# Patient Record
Sex: Female | Born: 1935
Health system: Southern US, Community
[De-identification: ages and names within clinical notes are randomized; demographics above are authoritative.]

## PROBLEM LIST (undated history)

## (undated) DIAGNOSIS — I10 Essential (primary) hypertension: Secondary | ICD-10-CM

## (undated) DIAGNOSIS — I2510 Atherosclerotic heart disease of native coronary artery without angina pectoris: Secondary | ICD-10-CM

## (undated) DIAGNOSIS — E119 Type 2 diabetes mellitus without complications: Secondary | ICD-10-CM

## (undated) DIAGNOSIS — M1990 Unspecified osteoarthritis, unspecified site: Secondary | ICD-10-CM

## (undated) DIAGNOSIS — J45909 Unspecified asthma, uncomplicated: Secondary | ICD-10-CM

## (undated) HISTORY — DX: Atherosclerotic heart disease of native coronary artery without angina pectoris: I25.10

## (undated) HISTORY — PX: CHOLECYSTECTOMY: SHX55

## (undated) HISTORY — PX: ABDOMINAL HYSTERECTOMY: SHX81

---

## 2006-08-11 ENCOUNTER — Inpatient Hospital Stay (HOSPITAL_COMMUNITY): Admission: EM | Admit: 2006-08-11 | Discharge: 2006-08-12 | Payer: Self-pay | Admitting: Family Medicine

## 2009-04-07 ENCOUNTER — Emergency Department (HOSPITAL_COMMUNITY): Admission: EM | Admit: 2009-04-07 | Discharge: 2009-04-07 | Payer: Self-pay | Admitting: Emergency Medicine

## 2010-11-29 LAB — URINALYSIS, ROUTINE W REFLEX MICROSCOPIC
Bilirubin Urine: NEGATIVE
Nitrite: NEGATIVE
Protein, ur: NEGATIVE mg/dL
Urobilinogen, UA: 1 mg/dL (ref 0.0–1.0)

## 2010-11-29 LAB — URINE MICROSCOPIC-ADD ON

## 2010-11-29 LAB — URINE CULTURE: Colony Count: 4000

## 2011-01-09 NOTE — Discharge Summary (Signed)
NAMESTEFAN, [name]                  ACCOUNT NO.:  [id number]   MEDICAL RECORD NO.:  [id number]          PATIENT TYPE:  INP   LOCATION:  [id number]                         FACILITY:  [facility]   PHYSICIAN:  Aquilla Hacker, M.D. DATE OF BIRTH:  1934-08-28   DATE OF ADMISSION:  08/10/2006  DATE OF DISCHARGE:  08/12/2006                               DISCHARGE SUMMARY   FINAL DIAGNOSES:  1. hyperglycemia secondary to newly diagnosed diabetes mellitus.  2. Hyperkalemia.  3. Azotemia.  4. Newly diagnosed hypertension.   PROCEDURES:  Portable chest x-ray completed December18,2007.   HISTORY OF PRESENT ILLNESS:  [name] is a 76 year old female who  arrived stating that for one week prior to her admission she had  developed lightheadedness, dizziness, generalized weakness as well as an  increase in urination and thirst.  Her vision had become intermittently  blurred.  She had experienced two episodes of nausea and emesis.  For  past medical history, please see that dictated by Dr. Rexene Alberts on  December18,2007.   HOSPITAL COURSE:  Problem 1:  NEWLY DIAGNOSED DIABETES MELLITUS:  The  patient indicated that she has numerous family members including  siblings who have a history of diabetes mellitus.  She was found to be  hyperglycemic.  Her venous glucose in the emergency department  registered greater than 700.  She was not found to be in ketoacidosis,  however.  She was started on a Glucomander which quickly helped to  decrease her sugars.  Glipizide was initiated as well as Lantus insulin.  The patient's symptoms quickly resolved as her sugars decreased.  The  patient's sugars, however, never became ideally controlled, although her  symptoms did improve.  I am hoping that the patient's primary care  physician may help to bring the patient's sugars into more ideal range  when she is discharged from the hospital.   Problem 2:  NEWLY DIAGNOSED HYPERTENSION:  At the time of her admission,  the patient's blood pressure was noted to have been 171/91.  She was  started on Norvasc during her hospital course.  Her blood pressures have  been better controlled over the course of the hospitalization.   Problem 3:  HYPERKALEMIA:  When the patient initially arrived into the  hospital, her potassium level was noted to have been 5.7.  This was  believed to have been related to the patient's hyperglycemia.  The  patient's potassium level has improved over the course of her  hospitalization.   Problem 4:  AZOTEMIA:  The patient's BUN level at the time of admission  was 36.  Her creatinine level reached a high of 1.4.  Both numbers have  since improved over the course of her hospitalization.  Her BUN as of  today December20, 2007 is 21.  Her creatinine is 0.9.  The patient  currently indicates that her symptoms feel better.  She has not had any  repeat episodes of nausea or vomiting documented during the course of  this hospitalization.  The decision has been made to discharge the  patient from the hospital and to  home.  The patient's vitals on the date  of discharge:  Her temperature is 98.2, heart rate 77, respirations 18,  blood pressure 110/75.  Her CBG have been 319, 367 and 228 respectively.  Her O2 sats are 97% on room air.  The patient's labs:  Her white blood  cell count is 10.6, hemoglobin 12.6, hematocrit 37.7, platelet count  199.  Sodium 137, potassium 5.0, chloride 110, CO2 23, BUN 21,  creatinine 0.9, calcium 8.7.  Her hemoglobin A1c is 12.7.  On  December19, 2007, it was noted to be 13.1.  Her cholesterol was 177,  triglycerides 165, HDL 35, LDL 109.  The decision has been made to  discharge the patient from the hospital.  The patient will be discharged  home on the following medications:  1. Glipizide 10 mg p.o. b.i.d.  2. Norvasc 5 mg p.o. daily.  3. Lantus insulin 10 units subcutaneously q.h.s.   I have called Dr. Sheilah Mins clinic and requested that the patient  be  accepted as a new patient.  I was told that the patient would be  accepted by one of Dr. Fransico Setters staff members.  The patient is scheduled  to be seen for the very first time by Dr. Criss Rosales on December28,2007.      Aquilla Hacker, M.D.  Electronically Signed     OR/MEDQ  D:  08/12/2006  T:  08/12/2006  Job:  [id number]   cc:   Lucianne Lei, M.D.

## 2011-01-09 NOTE — H&P (Signed)
[name], [name]                  ACCOUNT NO.:  [id number]   MEDICAL RECORD NO.:  [id number]          PATIENT TYPE:  INP   LOCATION:  1825                         FACILITY:  [facility]   PHYSICIAN:  Rexene Alberts, M.D.    DATE OF BIRTH:  Dec 26, 1934   DATE OF ADMISSION:  08/10/2006  DATE OF DISCHARGE:                              HISTORY & PHYSICAL   PRIMARY CARE PHYSICIAN:  The patient is unassigned.   CHIEF COMPLAINT:  Dizziness, generalized weakness, increased thirst and  increased urination.   HISTORY OF PRESENT ILLNESS:  The patient is a 76 year old woman with no  significant past medical history who presents to the emergency  department with a one-week history of progressive lightheadedness,  dizziness, generalized weakness, an increase in urination and thirst,  intermittent blurred vision and two episodes of nausea and vomiting.  The patient has no complaints of fevers, chills, chest pain, cough,  abdominal pain, painful urination or diarrhea.  She denies headache.  She denies vertigo.   The patient has no known prior history of diabetes mellitus; however,  she has a significant family history of diabetes mellitus.  Both of her  parents had diabetes mellitus and all of her siblings have diabetes  mellitus.  She thought that diabetes would pass me by.   During the evaluation in the emergency department, the patient is found  to be hypertensive, otherwise hemodynamically stable.  Her venous  glucose was read as greater than 700.  Her bicarbonate is 20.8.  Her  serum sodium is 134 and her serum potassium is 5.7.  The patient will be  admitted for further evaluation and management.   PAST MEDICAL HISTORY:  Status post hysterectomy.   MEDICATIONS:  Centrum vitamin once a day.   ALLERGIES:  No known drug allergies.   SOCIAL HISTORY:  The patient is single.  She lives alone.  She has three  children.  She is a retired [employer].  She smokes a half a pack of cigarettes  per day and  has been doing so for more than 50 years.  She denies  alcohol use and illicit drug use.   FAMILY HISTORY:  Her mother died of a heart attack at 71 years of age.  She had diabetes mellitus as well.  Her father died at 33 years of age  of a heart attack.  He also had a history of diabetes mellitus.  She had  one sister who died of end-stage renal disease as a consequence of  diabetes mellitus.  She has another sister who has end-stage renal  disease and diabetes mellitus.  She has one brother who has end-stage  renal disease and diabetes mellitus.   REVIEW OF SYSTEMS:  The patient's review of systems is above in the  history of present illness.  Otherwise review of systems is negative.  Mainly, no evidence of black, tarry stools, bright red blood per rectum  or coffee-ground emesis.   PHYSICAL EXAMINATION:  Temperature 97.9, blood pressure 171/91, pulse  95, oxygen saturation 99% on room air, respiratory rate 20.  GENERAL:  The  patient is a pleasant 76 year old African American woman  who is currently lying in bed in no acute distress.  HEENT:  Head is normocephalic and atraumatic.  Pupils are equal, round,  and reactive to light. Extraocular movements intact.  Conjunctivae are  clear.  Sclerae are white.  Nasal mucosa is dry.  No sinus tenderness.  Oropharynx reveals a full set of dentures.  Moist mucus membranes.  No  posterior exudates or erythema.  Neck is supple.  No adenopathy, no  thyromegaly, no bruit, no JVD.  LUNGS:  Clear to auscultation bilaterally.  HEART:  S1-S2 with no murmurs, gallops, or rubs.  ABDOMEN:  Positive bowel sounds, soft, nontender, and nondistended.  No  hepatosplenomegaly.  No masses palpated.  EXTREMITIES:  Pedal pulses are palpable bilaterally.  No pretibial edema  and no pedal edema.  NEUROLOGICAL:  The patient is alert and oriented x3.  Cranial nerves II  through XII intact are intact.  Strength is 5/5 in the supine position.  Sensation is grossly  intact.  Gait was not assessed.   ADMISSION LABORATORIES:  EKG is pending.  Sodium 134, potassium 5.7,  chloride 104, BUN 36, glucose greater than 700, bicarbonate 20.8.  WBC  9.4, hemoglobin 15.4, platelets 248.  Urinalysis greater than 1000  glucose, ketones 15, protein negative, leukocyte esterase negative.   ASSESSMENT:  1. Hyperglycemia, secondary to newly diagnosed uncontrolled Type 2      Diabetes Mellitus:  The patient's venous glucose is over 700.  She      does not appear to be in diabetic ketoacidosis as her bicarbonate      level is approximately 21.  As mentioned above, the patient has a      significant family history of diabetes mellitus and kidney disease.  2. Hyperkalemia:  The patient's serum potassium is 5.7.  This is a      consequence of uncontrolled hyperglycemia.  With treatment of the      hyperglycemia and IV fluids, the serum potassium is expected to      decrease.  3. Azotemia:  The patient's BUN is 36.  Creatinine is pending.  More      than likely, the patient has acute renal insufficiency as a      consequence of prerenal azotemia from volume depletion.  4. Elevated blood pressure:  The patient's blood pressure is 171/91.      This could be an isolated elevated blood pressure or logically the      patient probably has undiagnosed primary hypertension.  5. Tobacco abuse.   PLAN:  1. The patient will be admitted for further evaluation and management.  2. We will start the Glucomander protocol.  3. The patient's CBGs will be followed closely.  We will add glipizide      5 mg b.i.d. for the start of oral hypoglycemic therapy.  4. Intravenous fluid volume repletion with normal saline with 20 mEq      of potassium chloride added.  5. We will add Norvasc 5 mg daily initially for hypertension.  6. We will follow the patient's electrolytes, potassium in particular,     renal function and we will check a hemoglobin A1c.  We will check a      baseline EKG (the  patient's EKG was later obtained and it revealed      normal sinus rhythm with a heart rate of 84 beats per minute and no      acute abnormalities seen).  7. We  will start diabetes education.  8. The patient was encouraged to acquire a primary care physician.      The hospitalist service will assist the patient with acquiring a      new primary care physician.  9. Tobacco cessation counseling.      Rexene Alberts, M.D.  Electronically Signed     DF/MEDQ  D:  08/11/2006  T:  08/11/2006  Job:  [id number]

## 2011-03-18 ENCOUNTER — Ambulatory Visit
Admission: RE | Admit: 2011-03-18 | Discharge: 2011-03-18 | Disposition: A | Discharge status: Home / Self Care | Payer: Medicare Other | Source: Ambulatory Visit | Attending: Family Medicine | Admitting: Family Medicine

## 2011-03-18 ENCOUNTER — Other Ambulatory Visit: Payer: Self-pay | Admitting: Family Medicine

## 2011-03-18 DIAGNOSIS — M1250 Traumatic arthropathy, unspecified site: Secondary | ICD-10-CM

## 2011-09-01 ENCOUNTER — Ambulatory Visit
Admission: RE | Admit: 2011-09-01 | Discharge: 2011-09-01 | Disposition: A | Discharge status: Home / Self Care | Payer: Medicare Other | Source: Ambulatory Visit | Attending: Family Medicine | Admitting: Family Medicine

## 2011-09-01 ENCOUNTER — Other Ambulatory Visit: Payer: Self-pay | Admitting: Family Medicine

## 2011-09-01 DIAGNOSIS — R52 Pain, unspecified: Secondary | ICD-10-CM

## 2013-09-20 ENCOUNTER — Other Ambulatory Visit (HOSPITAL_COMMUNITY): Payer: Self-pay | Admitting: Family Medicine

## 2013-09-20 DIAGNOSIS — Z0000 Encounter for general adult medical examination without abnormal findings: Secondary | ICD-10-CM

## 2013-09-25 ENCOUNTER — Ambulatory Visit (HOSPITAL_COMMUNITY)
Admission: RE | Admit: 2013-09-25 | Discharge: 2013-09-25 | Disposition: A | Discharge status: Home / Self Care | Payer: Medicare Other | Source: Ambulatory Visit | Attending: Family Medicine | Admitting: Family Medicine

## 2013-09-25 DIAGNOSIS — Z0000 Encounter for general adult medical examination without abnormal findings: Secondary | ICD-10-CM

## 2013-09-25 DIAGNOSIS — Z1231 Encounter for screening mammogram for malignant neoplasm of breast: Secondary | ICD-10-CM | POA: Insufficient documentation

## 2013-09-29 ENCOUNTER — Other Ambulatory Visit: Payer: Self-pay | Admitting: Family Medicine

## 2013-09-29 DIAGNOSIS — R928 Other abnormal and inconclusive findings on diagnostic imaging of breast: Secondary | ICD-10-CM

## 2013-10-09 ENCOUNTER — Ambulatory Visit
Admission: RE | Admit: 2013-10-09 | Discharge: 2013-10-09 | Disposition: A | Discharge status: Home / Self Care | Payer: Medicare Other | Source: Ambulatory Visit | Attending: Family Medicine | Admitting: Family Medicine

## 2013-10-09 DIAGNOSIS — R928 Other abnormal and inconclusive findings on diagnostic imaging of breast: Secondary | ICD-10-CM

## 2014-01-29 ENCOUNTER — Emergency Department (HOSPITAL_COMMUNITY): Payer: Medicare Other

## 2014-01-29 ENCOUNTER — Encounter (HOSPITAL_COMMUNITY): Payer: Self-pay | Admitting: Emergency Medicine

## 2014-01-29 ENCOUNTER — Emergency Department (HOSPITAL_COMMUNITY)
Admission: EM | Admit: 2014-01-29 | Discharge: 2014-01-29 | Disposition: A | Discharge status: Home / Self Care | Payer: Medicare Other | Attending: Emergency Medicine | Admitting: Emergency Medicine

## 2014-01-29 DIAGNOSIS — R42 Dizziness and giddiness: Secondary | ICD-10-CM

## 2014-01-29 DIAGNOSIS — I10 Essential (primary) hypertension: Secondary | ICD-10-CM | POA: Insufficient documentation

## 2014-01-29 DIAGNOSIS — Z79899 Other long term (current) drug therapy: Secondary | ICD-10-CM | POA: Insufficient documentation

## 2014-01-29 DIAGNOSIS — H538 Other visual disturbances: Secondary | ICD-10-CM | POA: Insufficient documentation

## 2014-01-29 DIAGNOSIS — Z794 Long term (current) use of insulin: Secondary | ICD-10-CM | POA: Insufficient documentation

## 2014-01-29 DIAGNOSIS — E119 Type 2 diabetes mellitus without complications: Secondary | ICD-10-CM | POA: Insufficient documentation

## 2014-01-29 HISTORY — DX: Essential (primary) hypertension: I10

## 2014-01-29 HISTORY — DX: Type 2 diabetes mellitus without complications: E11.9

## 2014-01-29 LAB — CBC WITH DIFFERENTIAL/PLATELET
Basophils Absolute: 0 10*3/uL (ref 0.0–0.1)
Basophils Relative: 0 % (ref 0–1)
Eosinophils Absolute: 0 10*3/uL (ref 0.0–0.7)
Eosinophils Relative: 0 % (ref 0–5)
HEMATOCRIT: 37.1 % (ref 36.0–46.0)
Hemoglobin: 12.1 g/dL (ref 12.0–15.0)
Lymphocytes Relative: 48 % — ABNORMAL HIGH (ref 12–46)
Lymphs Abs: 4.3 10*3/uL — ABNORMAL HIGH (ref 0.7–4.0)
MCH: 27.3 pg (ref 26.0–34.0)
MCHC: 32.6 g/dL (ref 30.0–36.0)
MCV: 83.7 fL (ref 78.0–100.0)
Monocytes Absolute: 0.8 10*3/uL (ref 0.1–1.0)
Monocytes Relative: 9 % (ref 3–12)
NEUTROS PCT: 43 % (ref 43–77)
Neutro Abs: 3.9 10*3/uL (ref 1.7–7.7)
Platelets: 214 10*3/uL (ref 150–400)
RBC: 4.43 MIL/uL (ref 3.87–5.11)
RDW: 14.9 % (ref 11.5–15.5)
WBC: 9 10*3/uL (ref 4.0–10.5)

## 2014-01-29 LAB — BASIC METABOLIC PANEL
BUN: 11 mg/dL (ref 6–23)
CALCIUM: 9.6 mg/dL (ref 8.4–10.5)
CHLORIDE: 104 meq/L (ref 96–112)
CO2: 28 meq/L (ref 19–32)
Creatinine, Ser: 0.91 mg/dL (ref 0.50–1.10)
GFR calc Af Amer: 69 mL/min — ABNORMAL LOW (ref >90)
GFR, EST NON AFRICAN AMERICAN: 60 mL/min — AB (ref >90)
GLUCOSE: 121 mg/dL — AB (ref 70–99)
Potassium: 4.1 mEq/L (ref 3.7–5.3)
Sodium: 143 mEq/L (ref 137–147)

## 2014-01-29 LAB — URINALYSIS, ROUTINE W REFLEX MICROSCOPIC
Bilirubin Urine: NEGATIVE
Glucose, UA: NEGATIVE mg/dL
Hgb urine dipstick: NEGATIVE
KETONES UR: NEGATIVE mg/dL
Nitrite: NEGATIVE
PROTEIN: NEGATIVE mg/dL
Specific Gravity, Urine: 1.002 — ABNORMAL LOW (ref 1.005–1.030)
Urobilinogen, UA: 0.2 mg/dL (ref 0.0–1.0)
pH: 6.5 (ref 5.0–8.0)

## 2014-01-29 LAB — URINE MICROSCOPIC-ADD ON

## 2014-01-29 MED ORDER — MECLIZINE HCL 25 MG PO TABS: 25.0000 mg | ORAL_TABLET | Freq: Two times a day (BID) | ORAL | Status: DC | PRN

## 2014-01-29 MED ORDER — LORAZEPAM 1 MG PO TABS
1.0000 mg | ORAL_TABLET | Freq: Once | ORAL | Status: AC
  Administered 2014-01-29: 1 mg via ORAL
  Filled 2014-01-29: qty 1

## 2014-01-29 MED ORDER — MECLIZINE HCL 25 MG PO TABS
25.0000 mg | ORAL_TABLET | Freq: Once | ORAL | Status: AC
  Administered 2014-01-29: 25 mg via ORAL
  Filled 2014-01-29: qty 1

## 2014-01-29 NOTE — ED Notes (Signed)
Initial contact-A&O x4. Ambulatory. No c/o dizziness. In NAD.

## 2014-01-29 NOTE — ED Notes (Signed)
Patient transported to MRI 

## 2014-01-29 NOTE — Discharge Instructions (Signed)

## 2014-01-29 NOTE — ED Notes (Signed)
Pt states yesterday and this morning she had a dizzy and blurred vision. States they occurred separately. Pt also complains of n/d. Pt states she does not have any at the moment. Pt has not taken meds this morning.

## 2014-01-29 NOTE — ED Notes (Signed)
Patient denies any dizziness at this time.

## 2014-01-29 NOTE — ED Provider Notes (Signed)
CSN: [id number]     Arrival date & time 01/29/14  0734 History   First MD Initiated Contact with Patient 01/29/14 [phone]     Chief Complaint  Patient presents with  . Dizziness  . Blurred Vision     (Consider location/radiation/quality/duration/timing/severity/associated sxs/prior Treatment) HPI  This is a 79 yo female with history of diabetes and hypertension who presents with dizziness and blurry vision. Patient reports onset of symptoms yesterday.  She states "I feel like I'm drunk." She reports room spinning dizziness that has worsened. She feels slightly dizziness may be worse with head movement. She denies any vomiting. She reports blurry vision but no double vision. She denies any weakness, numbness, or tingling. No noted history of stroke.  She denies any recent illnesses, fevers, chest pain or shortness of breath.  Patient states that she still feels "funny" but that the room is not spinning.  Past Medical History  Diagnosis Date  . Diabetes mellitus without complication   . Hypertension    History reviewed. No pertinent past surgical history. No family history on file. History  Substance Use Topics  . Smoking status: Never Smoker   . Smokeless tobacco: Not on file  . Alcohol Use: No   OB History   Grav Para Term Preterm Abortions TAB SAB Ect Mult Living                 Review of Systems  Constitutional: Negative for fever.  Eyes: Positive for visual disturbance.  Respiratory: Negative for chest tightness and shortness of breath.   Cardiovascular: Negative for chest pain.  Gastrointestinal: Negative for nausea, vomiting and abdominal pain.  Genitourinary: Negative for dysuria.  Neurological: Positive for dizziness. Negative for weakness, numbness and headaches.  Psychiatric/Behavioral: Negative for confusion.  All other systems reviewed and are negative.     Allergies  Review of patient's allergies indicates no known allergies.  Home Medications   Prior to  Admission medications   Medication Sig Start Date End Date Taking? Authorizing Provider  amLODipine-olmesartan (AZOR) 5-20 MG per tablet Take 1 tablet by mouth daily.   Yes Historical Provider, MD  aspirin 325 MG tablet Take 325 mg by mouth daily.   Yes Historical Provider, MD  Cholecalciferol (VITAMIN D-3) 5000 UNITS TABS Take 1 capsule by mouth once a week. On fridays   Yes Historical Provider, MD  glipiZIDE (GLUCOTROL) 10 MG tablet Take 10 mg by mouth 2 (two) times daily.   Yes Historical Provider, MD  insulin glargine (LANTUS) 100 unit/mL SOPN Inject 14 Units into the skin daily.   Yes Historical Provider, MD  metFORMIN (GLUCOPHAGE) 500 MG tablet Take 500 mg by mouth 2 (two) times daily with a meal.   Yes Historical Provider, MD  naproxen (NAPROSYN) 500 MG tablet Take 500 mg by mouth 2 (two) times daily with a meal.   Yes Historical Provider, MD  rosuvastatin (CRESTOR) 5 MG tablet Take 5 mg by mouth daily.   Yes Historical Provider, MD  sitaGLIPtin (JANUVIA) 100 MG tablet Take 100 mg by mouth daily.   Yes Historical Provider, MD  meclizine (ANTIVERT) 25 MG tablet Take 1 tablet (25 mg total) by mouth 2 (two) times daily as needed for dizziness. 01/29/14   Merryl Hacker, MD   BP 135/75  Pulse 78  Resp 20  SpO2 97% Physical Exam  Nursing note and vitals reviewed. Constitutional: She is oriented to person, place, and time. No distress.  Elderly  HENT:  Head: Normocephalic and atraumatic.  Mouth/Throat: Oropharynx is clear and moist.  Eyes: EOM are normal. Pupils are equal, round, and reactive to light.  No nystagmus noted  Neck: Neck supple.  Cardiovascular: Normal rate, regular rhythm and normal heart sounds.   No murmur heard. Pulmonary/Chest: Effort normal and breath sounds normal. No respiratory distress. She has no wheezes.  Abdominal: Soft. There is no tenderness.  Musculoskeletal: She exhibits no edema.  Neurological: She is alert and oriented to person, place, and time.   Cranial nerves II through XII intact, no dysmetria noted to finger-nose-finger, 5 out of 5 strength in all 4 extremities  Skin: Skin is warm and dry.  Psychiatric: She has a normal mood and affect.    ED Course  Procedures (including critical care time) Labs Review Labs Reviewed  CBC WITH DIFFERENTIAL - Abnormal; Notable for the following:    Lymphocytes Relative 48 (*)    Lymphs Abs 4.3 (*)    All other components within normal limits  BASIC METABOLIC PANEL - Abnormal; Notable for the following:    Glucose, Bld 121 (*)    GFR calc non Af Amer 60 (*)    GFR calc Af Amer 69 (*)    All other components within normal limits  URINALYSIS, ROUTINE W REFLEX MICROSCOPIC - Abnormal; Notable for the following:    Specific Gravity, Urine 1.002 (*)    Leukocytes, UA TRACE (*)    All other components within normal limits  URINE MICROSCOPIC-ADD ON    Imaging Review Mr Brain Wo Contrast  01/29/2014   CLINICAL DATA:  Two episodes of dizziness and blurred vision.  EXAM: MRI HEAD WITHOUT CONTRAST  TECHNIQUE: Multiplanar, multiecho pulse sequences of the brain and surrounding structures were obtained without intravenous contrast.  COMPARISON:  None.  FINDINGS: The diffusion-weighted images demonstrate no evidence for acute or subacute infarction. Mild atrophy and white matter disease is present. The ventricles are proportionate to the degree of atrophy. No significant extra-axial fluid collections are present. An enlarged empty sella is noted.  Flow is present in the major intracranial arteries. The patient is status post left lens replacement. The globes and orbits are otherwise intact. The paranasal sinuses and mastoid air cells are clear.  IMPRESSION: 1. Mild atrophy and white matter disease. This likely reflects the sequelae of chronic microvascular ischemia. 2. No acute or focal lesion to explain the patient's symptoms.   Electronically Signed   By: Lawrence Santiago M.D.   On: 01/29/2014 10:58     EKG  Interpretation   Date/Time:  Monday January 29 2014 08:03:54 EDT Ventricular Rate:  74 PR Interval:  167 QRS Duration: 81 QT Interval:  401 QTC Calculation: 445 R Axis:   37 Text Interpretation:  Sinus rhythm Abnormal R-wave progression, early  transition Confirmed by Deriyah Kunath  MD, [place] ([id number]) on 01/29/2014 10:47:06  AM      MDM   Final diagnoses:  Vertigo   Patient presents with symptoms suggestive of vertigo. She is nontoxic on exam. She is nonfocal and shows no evidence of cerebellar dysfunction. She's currently asymptomatic. Basic labwork obtained. No evidence DVT. Glucose is normal. Given patient's age and risk factors, MRI obtained to rule out cerebellar stroke. MRI is negative. Patient improved with meclizine. Suspect peripheral vertigo. Given improvement of symptoms, will discharge home with meclizine.  After history, exam, and medical workup I feel the patient has been appropriately medically screened and is safe for discharge home. Pertinent diagnoses were discussed with the patient. Patient was given return precautions.  Merryl Hacker, MD 01/29/14 [phone]

## 2014-11-06 DIAGNOSIS — M792 Neuralgia and neuritis, unspecified: Secondary | ICD-10-CM | POA: Diagnosis not present

## 2014-11-06 DIAGNOSIS — B351 Tinea unguium: Secondary | ICD-10-CM | POA: Diagnosis not present

## 2014-11-14 DIAGNOSIS — E0800 Diabetes mellitus due to underlying condition with hyperosmolarity without nonketotic hyperglycemic-hyperosmolar coma (NKHHC): Secondary | ICD-10-CM | POA: Diagnosis not present

## 2014-11-14 DIAGNOSIS — I10 Essential (primary) hypertension: Secondary | ICD-10-CM | POA: Diagnosis not present

## 2014-11-15 DIAGNOSIS — E782 Mixed hyperlipidemia: Secondary | ICD-10-CM | POA: Diagnosis not present

## 2014-11-15 DIAGNOSIS — E0800 Diabetes mellitus due to underlying condition with hyperosmolarity without nonketotic hyperglycemic-hyperosmolar coma (NKHHC): Secondary | ICD-10-CM | POA: Diagnosis not present

## 2014-11-15 DIAGNOSIS — J441 Chronic obstructive pulmonary disease with (acute) exacerbation: Secondary | ICD-10-CM | POA: Diagnosis not present

## 2014-11-16 ENCOUNTER — Ambulatory Visit
Admission: RE | Admit: 2014-11-16 | Discharge: 2014-11-16 | Disposition: A | Discharge status: Home / Self Care | Payer: Medicare Other | Source: Ambulatory Visit | Attending: Family Medicine | Admitting: Family Medicine

## 2014-11-16 ENCOUNTER — Other Ambulatory Visit: Payer: Self-pay

## 2014-11-16 ENCOUNTER — Ambulatory Visit
Admission: RE | Admit: 2014-11-16 | Discharge: 2014-11-16 | Disposition: A | Discharge status: Home / Self Care | Payer: Medicare Other | Source: Ambulatory Visit

## 2014-11-16 ENCOUNTER — Other Ambulatory Visit: Payer: Self-pay | Admitting: Family Medicine

## 2014-11-16 DIAGNOSIS — R0602 Shortness of breath: Secondary | ICD-10-CM | POA: Diagnosis not present

## 2014-11-16 DIAGNOSIS — Z1231 Encounter for screening mammogram for malignant neoplasm of breast: Secondary | ICD-10-CM

## 2014-11-16 DIAGNOSIS — R0689 Other abnormalities of breathing: Secondary | ICD-10-CM

## 2014-11-19 ENCOUNTER — Other Ambulatory Visit: Payer: Self-pay | Admitting: Family Medicine

## 2014-11-19 DIAGNOSIS — R928 Other abnormal and inconclusive findings on diagnostic imaging of breast: Secondary | ICD-10-CM

## 2014-11-21 ENCOUNTER — Ambulatory Visit
Admission: RE | Admit: 2014-11-21 | Discharge: 2014-11-21 | Disposition: A | Discharge status: Home / Self Care | Payer: Medicare Other | Source: Ambulatory Visit | Attending: Family Medicine | Admitting: Family Medicine

## 2014-11-21 ENCOUNTER — Other Ambulatory Visit: Payer: Self-pay | Admitting: Family Medicine

## 2014-11-21 DIAGNOSIS — R928 Other abnormal and inconclusive findings on diagnostic imaging of breast: Secondary | ICD-10-CM

## 2014-11-21 DIAGNOSIS — R590 Localized enlarged lymph nodes: Secondary | ICD-10-CM | POA: Diagnosis not present

## 2014-11-26 ENCOUNTER — Other Ambulatory Visit: Payer: Self-pay | Admitting: Family Medicine

## 2014-11-26 DIAGNOSIS — R928 Other abnormal and inconclusive findings on diagnostic imaging of breast: Secondary | ICD-10-CM

## 2014-11-28 ENCOUNTER — Ambulatory Visit
Admission: RE | Admit: 2014-11-28 | Discharge: 2014-11-28 | Disposition: A | Discharge status: Home / Self Care | Payer: Medicare Other | Source: Ambulatory Visit | Attending: Family Medicine | Admitting: Family Medicine

## 2014-11-28 ENCOUNTER — Other Ambulatory Visit (HOSPITAL_COMMUNITY): Payer: Self-pay | Admitting: Diagnostic Radiology

## 2014-11-28 DIAGNOSIS — R599 Enlarged lymph nodes, unspecified: Secondary | ICD-10-CM | POA: Diagnosis not present

## 2014-11-28 DIAGNOSIS — R928 Other abnormal and inconclusive findings on diagnostic imaging of breast: Secondary | ICD-10-CM

## 2014-11-28 DIAGNOSIS — R590 Localized enlarged lymph nodes: Secondary | ICD-10-CM | POA: Diagnosis not present

## 2014-12-04 DIAGNOSIS — B351 Tinea unguium: Secondary | ICD-10-CM | POA: Diagnosis not present

## 2014-12-04 DIAGNOSIS — M792 Neuralgia and neuritis, unspecified: Secondary | ICD-10-CM | POA: Diagnosis not present

## 2014-12-12 ENCOUNTER — Other Ambulatory Visit (HOSPITAL_COMMUNITY): Payer: Self-pay | Admitting: Radiology

## 2014-12-12 DIAGNOSIS — J441 Chronic obstructive pulmonary disease with (acute) exacerbation: Secondary | ICD-10-CM | POA: Diagnosis not present

## 2014-12-12 DIAGNOSIS — E0800 Diabetes mellitus due to underlying condition with hyperosmolarity without nonketotic hyperglycemic-hyperosmolar coma (NKHHC): Secondary | ICD-10-CM | POA: Diagnosis not present

## 2014-12-18 ENCOUNTER — Ambulatory Visit (HOSPITAL_COMMUNITY)
Admission: RE | Admit: 2014-12-18 | Discharge: 2014-12-18 | Disposition: A | Discharge status: Home / Self Care | Payer: Medicare Other | Source: Ambulatory Visit | Attending: Family Medicine | Admitting: Family Medicine

## 2014-12-18 DIAGNOSIS — J441 Chronic obstructive pulmonary disease with (acute) exacerbation: Secondary | ICD-10-CM | POA: Diagnosis not present

## 2014-12-18 LAB — PULMONARY FUNCTION TEST
DL/VA % pred: 90 %
DL/VA: 4.22 ml/min/mmHg/L
DLCO UNC % PRED: 58 %
DLCO unc: 13.5 ml/min/mmHg
FEF 25-75 Post: 2.09 L/sec
FEF 25-75 Pre: 1.93 L/sec
FEF2575-%Change-Post: 8 %
FEF2575-%PRED-POST: 156 %
FEF2575-%Pred-Pre: 144 %
FEV1-%CHANGE-POST: 1 %
FEV1-%Pred-Post: 115 %
FEV1-%Pred-Pre: 113 %
FEV1-POST: 1.78 L
FEV1-Pre: 1.75 L
FEV1FVC-%CHANGE-POST: 2 %
FEV1FVC-%PRED-PRE: 106 %
FEV6-%Change-Post: 0 %
FEV6-%PRED-POST: 112 %
FEV6-%PRED-PRE: 112 %
FEV6-POST: 2.14 L
FEV6-Pre: 2.16 L
FEV6FVC-%PRED-POST: 104 %
FEV6FVC-%PRED-PRE: 104 %
FVC-%CHANGE-POST: 0 %
FVC-%Pred-Post: 107 %
FVC-%Pred-Pre: 107 %
FVC-PRE: 2.16 L
FVC-Post: 2.14 L
POST FEV6/FVC RATIO: 100 %
PRE FEV1/FVC RATIO: 81 %
Post FEV1/FVC ratio: 83 %
Pre FEV6/FVC Ratio: 100 %
RV % PRED: 80 %
RV: 1.84 L
TLC % pred: 81 %
TLC: 4 L

## 2014-12-18 MED ORDER — ALBUTEROL SULFATE (2.5 MG/3ML) 0.083% IN NEBU
2.5000 mg | INHALATION_SOLUTION | Freq: Once | RESPIRATORY_TRACT | Status: AC
  Administered 2014-12-18: 2.5 mg via RESPIRATORY_TRACT

## 2014-12-24 ENCOUNTER — Encounter: Payer: Self-pay | Admitting: General Surgery

## 2014-12-24 DIAGNOSIS — D479 Neoplasm of uncertain behavior of lymphoid, hematopoietic and related tissue, unspecified: Secondary | ICD-10-CM | POA: Diagnosis not present

## 2014-12-24 NOTE — Progress Notes (Signed)
[name] 12/24/2014 1:36 PM Location: [facility] Patient #: [id number] DOB: 1934/10/17 Widowed / Language: Cleophus Molt / Race: Black or African American Female  History of Present Illness Odis Hollingshead MD; 12/24/2014 2:12 PM) Patient words: lymphoid hyperplaysia.  The patient is a 80 year old female   Note:She is referred by Dr. Shon Hale at the breast imaging center because of atypical lymphoid proliferation of an enlarged right axillary lymph node. She underwent screening mammography November 16, 2014 which demonstrated possible bilateral axillary lymphadenopathy. No breast lesions were noted. She has no breast masses. There is no family history of lymphoma. On ultrasound, a 2 cm thickened right axillary lymph node was noted. Image guided biopsy demonstrated the above results. She has no night sweats or weight loss. Her energy level is normal. She has no axillary pain and did not notice any palpable adenopathy. Multiple family members are here with her today.   Problem List/Past Medical Odis Hollingshead, MD; 12/24/2014 2:07 PM) DM (DIABETES MELLITUS) (250.00  E11.9)  Other Problems Odis Hollingshead, MD; 12/24/2014 2:07 PM) Arthritis Hypercholesterolemia  Past Surgical History Jeralyn Ruths, [place]; 12/24/2014 1:36 PM) Breast Biopsy Right. Hysterectomy (not due to cancer) - Complete  Diagnostic Studies History Jeralyn Ruths, [place]; 12/24/2014 1:36 PM) Colonoscopy within last year Mammogram within last year Pap Smear >5 years ago  Allergies Jeralyn Ruths, CMA; 12/24/2014 1:41 PM) No Known Drug Allergies05/09/2014  Medication History Jeralyn Ruths, CMA; 12/24/2014 1:41 PM) Azor (5-20MG  Tablet, 1 Oral daily) Active. Aspirin (81MG  Tablet, 1 Oral daily) Active. Vitamin D3 (1000UNIT Tablet, 1 Oral daily) Active. GlipiZIDE ER (10MG  Tablet ER 24HR, Oral) Active. Glucotrol XL (10MG  Tablet ER, Oral daily) Active. Lantus SoloStar (100UNIT/ML Solution,  Subcutaneous as directed) Active. Crestor (5MG  Tablet, 1 Oral daily) Active. Januvia (100MG  Tablet, 1 Oral daily) Active.  Social History Jeralyn Ruths, [place]; 12/24/2014 1:36 PM) Alcohol use Remotely quit alcohol use. Caffeine use Carbonated beverages, Coffee, Tea. No drug use Tobacco use Current every day smoker.  Family History Jeralyn Ruths, [place]; 12/24/2014 1:36 PM) Arthritis Father, Mother. Diabetes Mellitus Brother, Father, Mother, Sister. Hypertension Brother, Father, Mother, Sister. Kidney Disease Brother, Sister.  Pregnancy / Birth History Jeralyn Ruths, [place]; 12/24/2014 1:36 PM) Age at menarche 10 years. Age of menopause <47 Gravida 3 Maternal age 57-20 Para 3  Review of Systems ([place]; 12/24/2014 1:36 PM) General Not Present- Appetite Loss, Chills, Fatigue, Fever, Night Sweats, Weight Gain and Weight Loss. Skin Not Present- Change in Wart/Mole, Dryness, Hives, Jaundice, New Lesions, Non-Healing Wounds, Rash and Ulcer. HEENT Present- Wears glasses/contact lenses. Not Present- Earache, Hearing Loss, Hoarseness, Nose Bleed, Oral Ulcers, Ringing in the Ears, Seasonal Allergies, Sinus Pain, Sore Throat, Visual Disturbances and Yellow Eyes. Breast Not Present- Breast Mass, Breast Pain, Nipple Discharge and Skin Changes. Cardiovascular Not Present- Chest Pain, Difficulty Breathing Lying Down, Leg Cramps, Palpitations, Rapid Heart Rate, Shortness of Breath and Swelling of Extremities. Gastrointestinal Not Present- Abdominal Pain, Bloating, Bloody Stool, Change in Bowel Habits, Chronic diarrhea, Constipation, Difficulty Swallowing, Excessive gas, Gets full quickly at meals, Hemorrhoids, Indigestion, Nausea, Rectal Pain and Vomiting. Female Genitourinary Not Present- Frequency, Nocturia, Painful Urination, Pelvic Pain and Urgency. Musculoskeletal Not Present- Back Pain, Joint Pain, Joint Stiffness, Muscle Pain, Muscle Weakness and Swelling of  Extremities. Neurological Not Present- Decreased Memory, Fainting, Headaches, Numbness, Seizures, Tingling, Tremor, Trouble walking and Weakness. Psychiatric Not Present- Anxiety, Bipolar, Change in Sleep Pattern, Depression, Fearful and Frequent crying. Endocrine Not Present- Cold Intolerance, Excessive Hunger,  Hair Changes, Heat Intolerance, Hot flashes and New Diabetes. Hematology Not Present- Easy Bruising, Excessive bleeding, Gland problems, HIV and Persistent Infections.   Vitals Jearld Fenton Morris CMA; 12/24/2014 1:44 PM) 12/24/2014 1:43 PM Weight: 147.6 lb Height: 63in Body Surface Area: 1.73 m Body Mass Index: 26.15 kg/m Temp.: 98.51F(Oral)  Pulse: 88 (Regular)  Resp.: 18 (Unlabored)  BP: 136/72 (Sitting, Left Arm, Standard)    Physical Exam Odis Hollingshead MD; 12/24/2014 2:14 PM) The physical exam findings are as follows: Note:General: WDWN in NAD. Pleasant and cooperative.  HEENT: [place]/AT  NECK: Supple, no obvious mass or thyroid enlargement.  BREASTS: Symmetrical in size. No dominant masses, nipple discharge or suspicious skin lesions.  LYMPHATIC: There is a palpable mobile right axillary lymph node that is slightly tender to the touch. No palpable left axillary adenopathy..  NEUROLOGIC: Alert and oriented, answers questions appropriately.Marland Kitchen  PSYCHIATRIC: Normal mood, affect , and behavior.    Assessment & Plan Odis Hollingshead MD; 12/24/2014 2:15 PM) ATYPICAL LYMPHOPROLIFERATIVE DISORDER (238.79  D47.9) Impression: She has atypical lymphoid proliferation of the enlarged right axillary lymph node and excisional biopsy is recommended. She would like to proceed with this.  Plan: Right axillary lymph node biopsy. I would like to see her one week before the surgery to make sure the lymph node is still palpable. We discussed the procedure and risks of lymph node biopsy. Risks include but are not limited to bleeding, infection, wound healing problem, anesthesia,  nerve injury, lymphedema, inability to find the lymph node. She seems to understand all these and agrees with the plan.  Jackolyn Confer, MD

## 2014-12-25 DIAGNOSIS — H2511 Age-related nuclear cataract, right eye: Secondary | ICD-10-CM | POA: Diagnosis not present

## 2014-12-25 DIAGNOSIS — H40013 Open angle with borderline findings, low risk, bilateral: Secondary | ICD-10-CM | POA: Diagnosis not present

## 2015-01-02 DIAGNOSIS — J449 Chronic obstructive pulmonary disease, unspecified: Secondary | ICD-10-CM | POA: Diagnosis not present

## 2015-01-02 DIAGNOSIS — E0800 Diabetes mellitus due to underlying condition with hyperosmolarity without nonketotic hyperglycemic-hyperosmolar coma (NKHHC): Secondary | ICD-10-CM | POA: Diagnosis not present

## 2015-01-30 DIAGNOSIS — J441 Chronic obstructive pulmonary disease with (acute) exacerbation: Secondary | ICD-10-CM | POA: Diagnosis not present

## 2015-01-30 DIAGNOSIS — E0800 Diabetes mellitus due to underlying condition with hyperosmolarity without nonketotic hyperglycemic-hyperosmolar coma (NKHHC): Secondary | ICD-10-CM | POA: Diagnosis not present

## 2015-01-30 DIAGNOSIS — E782 Mixed hyperlipidemia: Secondary | ICD-10-CM | POA: Diagnosis not present

## 2015-01-30 DIAGNOSIS — K219 Gastro-esophageal reflux disease without esophagitis: Secondary | ICD-10-CM | POA: Diagnosis not present

## 2015-02-19 ENCOUNTER — Other Ambulatory Visit: Payer: Self-pay | Admitting: General Surgery

## 2015-02-19 DIAGNOSIS — D479 Neoplasm of uncertain behavior of lymphoid, hematopoietic and related tissue, unspecified: Secondary | ICD-10-CM | POA: Diagnosis not present

## 2015-02-21 NOTE — Patient Instructions (Addendum)
YOUR PROCEDURE IS SCHEDULED ON : 02/26/15  REPORT TO [facility]TR[facility] FOLLOW SIGNS TO EAST ELEVATOR - GO TO 3rd FLOOR CHECK IN AT 3 EAST NURSES STATION (SHORT STAY) AT: 10:00 AM  CALL THIS NUMBER IF YOU HAVE PROBLEMS THE MORNING OF SURGERY [phone]  REMEMBER:ONLY 1 PER PERSON MAY GO TO SHORT STAY WITH YOU TO GET READY THE MORNING OF YOUR SURGERY  DO NOT EAT FOOD OR DRINK LIQUIDS AFTER MIDNIGHT (MAY HAVE CLEAR LIQUIDS UNTIL 6:00 AM)  TAKE THESE MEDICINES THE MORNING OF SURGERY: NONE / TAKE 1/2 DOSE IN INSULIN THE NIGHT BEFORE SURGERY (7 UNITS)  STOP ASPIRIN / IBUPROFEN / ALEVE / VITAMINS / HERBAL MEDS __7__ DAYS BEFORE SURGERY  YOU MAY NOT HAVE ANY METAL ON YOUR BODY INCLUDING HAIR PINS AND PIERCING'S. DO NOT WEAR JEWELRY, MAKEUP, LOTIONS, POWDERS OR PERFUMES. DO NOT WEAR NAIL POLISH. DO NOT SHAVE 48 HRS PRIOR TO SURGERY. MEN MAY SHAVE FACE AND NECK.  DO NOT [place]. [facility] IS NOT RESPONSIBLE FOR VALUABLES.  CONTACTS, DENTURES OR PARTIALS MAY NOT BE WORN TO SURGERY. LEAVE SUITCASE IN CAR. CAN BE BROUGHT TO ROOM AFTER SURGERY.  PATIENTS DISCHARGED THE DAY OF SURGERY WILL NOT BE ALLOWED TO DRIVE HOME.  PLEASE READ OVER THE FOLLOWING INSTRUCTION SHEETS _________________________________________________________________________________                                          [facility] - PREPARING FOR SURGERY  Before surgery, you can play an important role.  Because skin is not sterile, your skin needs to be as free of germs as possible.  You can reduce the number of germs on your skin by washing with CHG (chlorahexidine gluconate) soap before surgery.  CHG is an antiseptic cleaner which kills germs and bonds with the skin to continue killing germs even after washing. Please DO NOT use if you have an allergy to CHG or antibacterial soaps.  If your skin becomes reddened/irritated stop using the CHG and inform your nurse when you arrive  at Short Stay. Do not shave (including legs and underarms) for at least 48 hours prior to the first CHG shower.  You may shave your face. Please follow these instructions carefully:   1.  Shower with CHG Soap the night before surgery and the  morning of Surgery.   2.  If you choose to wash your hair, wash your hair first as usual with your  normal  Shampoo.   3.  After you shampoo, rinse your hair and body thoroughly to remove the  shampoo.                                         4.  Use CHG as you would any other liquid soap.  You can apply chg directly  to the skin and wash . Gently wash with scrungie or clean wascloth    5.  Apply the CHG Soap to your body ONLY FROM THE NECK DOWN.   Do not use on open                           Wound or open sores. Avoid contact with eyes, ears mouth and genitals (private  parts).                        Genitals (private parts) with your normal soap.              6.  Wash thoroughly, paying special attention to the area where your surgery  will be performed.   7.  Thoroughly rinse your body with warm water from the neck down.   8.  DO NOT shower/wash with your normal soap after using and rinsing off  the CHG Soap .                9.  Pat yourself dry with a clean towel.             10.  Wear clean night clothes to bed after shower             11.  Place clean sheets on your bed the night of your first shower and do not  sleep with pets.  Day of Surgery : Do not apply any lotions/deodorants the morning of surgery.  Please wear clean clothes to the hospital/surgery center.  FAILURE TO FOLLOW THESE INSTRUCTIONS MAY RESULT IN THE CANCELLATION OF YOUR SURGERY    PATIENT SIGNATURE_________________________________  ______________________________________________________________________    CLEAR LIQUID DIET   Foods Allowed                                                                     Foods Excluded  Coffee and tea, regular and decaf                              liquids that you cannot  Plain Jell-O in any flavor                                             see through such as: Fruit ices (not with fruit pulp)                                     milk, soups, orange juice  Iced Popsicles                                                  All solid food Carbonated beverages, regular and diet                                    Cranberry, grape and apple juices Sports drinks like Gatorade Lightly seasoned clear broth or consume(fat free) Sugar, honey syrup

## 2015-02-22 ENCOUNTER — Encounter (HOSPITAL_COMMUNITY): Payer: Self-pay

## 2015-02-22 ENCOUNTER — Encounter (HOSPITAL_COMMUNITY)
Admission: RE | Admit: 2015-02-22 | Discharge: 2015-02-22 | Disposition: A | Discharge status: Home / Self Care | Payer: Medicare Other | Source: Ambulatory Visit | Attending: General Surgery | Admitting: General Surgery

## 2015-02-22 DIAGNOSIS — Z01810 Encounter for preprocedural cardiovascular examination: Secondary | ICD-10-CM | POA: Diagnosis not present

## 2015-02-22 DIAGNOSIS — I10 Essential (primary) hypertension: Secondary | ICD-10-CM | POA: Insufficient documentation

## 2015-02-22 DIAGNOSIS — Z01812 Encounter for preprocedural laboratory examination: Secondary | ICD-10-CM | POA: Diagnosis not present

## 2015-02-22 DIAGNOSIS — E119 Type 2 diabetes mellitus without complications: Secondary | ICD-10-CM | POA: Insufficient documentation

## 2015-02-22 HISTORY — DX: Unspecified osteoarthritis, unspecified site: M19.90

## 2015-02-22 HISTORY — DX: Unspecified asthma, uncomplicated: J45.909

## 2015-02-22 LAB — CBC WITH DIFFERENTIAL/PLATELET
Basophils Absolute: 0 10*3/uL (ref 0.0–0.1)
Basophils Relative: 0 % (ref 0–1)
Eosinophils Absolute: 0 10*3/uL (ref 0.0–0.7)
Eosinophils Relative: 0 % (ref 0–5)
HEMATOCRIT: 38.4 % (ref 36.0–46.0)
HEMOGLOBIN: 12.1 g/dL (ref 12.0–15.0)
LYMPHS PCT: 52 % — AB (ref 12–46)
Lymphs Abs: 5.3 10*3/uL — ABNORMAL HIGH (ref 0.7–4.0)
MCH: 25.4 pg — ABNORMAL LOW (ref 26.0–34.0)
MCHC: 31.5 g/dL (ref 30.0–36.0)
MCV: 80.7 fL (ref 78.0–100.0)
Monocytes Absolute: 0.8 10*3/uL (ref 0.1–1.0)
Monocytes Relative: 8 % (ref 3–12)
NEUTROS ABS: 4.1 10*3/uL (ref 1.7–7.7)
NEUTROS PCT: 40 % — AB (ref 43–77)
PLATELETS: 264 10*3/uL (ref 150–400)
RBC: 4.76 MIL/uL (ref 3.87–5.11)
RDW: 16.6 % — ABNORMAL HIGH (ref 11.5–15.5)
WBC: 10.2 10*3/uL (ref 4.0–10.5)

## 2015-02-22 LAB — COMPREHENSIVE METABOLIC PANEL
ALBUMIN: 3.7 g/dL (ref 3.5–5.0)
ALT: 15 U/L (ref 14–54)
AST: 19 U/L (ref 15–41)
Alkaline Phosphatase: 82 U/L (ref 38–126)
Anion gap: 9 (ref 5–15)
BILIRUBIN TOTAL: 0.7 mg/dL (ref 0.3–1.2)
BUN: 10 mg/dL (ref 6–20)
CALCIUM: 9.5 mg/dL (ref 8.9–10.3)
CO2: 27 mmol/L (ref 22–32)
Chloride: 104 mmol/L (ref 101–111)
Creatinine, Ser: 0.93 mg/dL (ref 0.44–1.00)
GFR calc Af Amer: >60 mL/min (ref >60)
GFR calc non Af Amer: 58 mL/min — ABNORMAL LOW (ref >60)
Glucose, Bld: 108 mg/dL — ABNORMAL HIGH (ref 65–99)
Potassium: 4.7 mmol/L (ref 3.5–5.1)
Sodium: 140 mmol/L (ref 135–145)
Total Protein: 7.8 g/dL (ref 6.5–8.1)

## 2015-02-22 LAB — PROTIME-INR
INR: 0.95 (ref 0.00–1.49)
Prothrombin Time: 12.9 seconds (ref 11.6–15.2)

## 2015-02-26 ENCOUNTER — Ambulatory Visit (HOSPITAL_COMMUNITY): Payer: Medicare Other | Admitting: Certified Registered"

## 2015-02-26 ENCOUNTER — Encounter (HOSPITAL_COMMUNITY)
Admission: RE | Disposition: A | Discharge status: Home / Self Care | Payer: Self-pay | Source: Ambulatory Visit | Attending: General Surgery

## 2015-02-26 ENCOUNTER — Ambulatory Visit (HOSPITAL_COMMUNITY)
Admission: RE | Admit: 2015-02-26 | Discharge: 2015-02-26 | Disposition: A | Discharge status: Home / Self Care | Payer: Medicare Other | Source: Ambulatory Visit | Attending: General Surgery | Admitting: General Surgery

## 2015-02-26 ENCOUNTER — Encounter (HOSPITAL_COMMUNITY): Payer: Self-pay | Admitting: *Deleted

## 2015-02-26 DIAGNOSIS — J45909 Unspecified asthma, uncomplicated: Secondary | ICD-10-CM | POA: Insufficient documentation

## 2015-02-26 DIAGNOSIS — I10 Essential (primary) hypertension: Secondary | ICD-10-CM | POA: Diagnosis not present

## 2015-02-26 DIAGNOSIS — E119 Type 2 diabetes mellitus without complications: Secondary | ICD-10-CM | POA: Insufficient documentation

## 2015-02-26 DIAGNOSIS — R590 Localized enlarged lymph nodes: Secondary | ICD-10-CM | POA: Diagnosis not present

## 2015-02-26 DIAGNOSIS — M1990 Unspecified osteoarthritis, unspecified site: Secondary | ICD-10-CM | POA: Diagnosis not present

## 2015-02-26 DIAGNOSIS — Z79899 Other long term (current) drug therapy: Secondary | ICD-10-CM | POA: Diagnosis not present

## 2015-02-26 DIAGNOSIS — F17210 Nicotine dependence, cigarettes, uncomplicated: Secondary | ICD-10-CM | POA: Diagnosis not present

## 2015-02-26 DIAGNOSIS — N6489 Other specified disorders of breast: Secondary | ICD-10-CM | POA: Diagnosis not present

## 2015-02-26 HISTORY — PX: AXILLARY LYMPH NODE BIOPSY: SHX5737

## 2015-02-26 LAB — GLUCOSE, CAPILLARY
Glucose-Capillary: 117 mg/dL — ABNORMAL HIGH (ref 65–99)
Glucose-Capillary: 88 mg/dL (ref 65–99)
Glucose-Capillary: 103 mg/dL — ABNORMAL HIGH (ref 65–99)

## 2015-02-26 SURGERY — AXILLARY LYMPH NODE BIOPSY
Anesthesia: General | Site: Axilla | Laterality: Right

## 2015-02-26 MED ORDER — FENTANYL CITRATE (PF) 100 MCG/2ML IJ SOLN
INTRAMUSCULAR | Status: AC
  Filled 2015-02-26: qty 2

## 2015-02-26 MED ORDER — DEXAMETHASONE SODIUM PHOSPHATE 10 MG/ML IJ SOLN
INTRAMUSCULAR | Status: AC
  Filled 2015-02-26: qty 1

## 2015-02-26 MED ORDER — BUPIVACAINE-EPINEPHRINE 0.5% -1:200000 IJ SOLN
INTRAMUSCULAR | Status: AC
  Filled 2015-02-26: qty 1

## 2015-02-26 MED ORDER — FENTANYL CITRATE (PF) 100 MCG/2ML IJ SOLN: 25.0000 ug | INTRAMUSCULAR | Status: DC | PRN

## 2015-02-26 MED ORDER — CEFAZOLIN SODIUM-DEXTROSE 2-3 GM-% IV SOLR
2.0000 g | INTRAVENOUS | Status: AC
  Administered 2015-02-26: 2 g via INTRAVENOUS

## 2015-02-26 MED ORDER — CEFAZOLIN SODIUM-DEXTROSE 2-3 GM-% IV SOLR
INTRAVENOUS | Status: AC
  Filled 2015-02-26: qty 50

## 2015-02-26 MED ORDER — AMLODIPINE BESYLATE 5 MG PO TABS
5.0000 mg | ORAL_TABLET | Freq: Once | ORAL | Status: AC
  Administered 2015-02-26: 5 mg via ORAL
  Filled 2015-02-26: qty 1

## 2015-02-26 MED ORDER — ONDANSETRON HCL 4 MG/2ML IJ SOLN: 4.0000 mg | Freq: Once | INTRAMUSCULAR | Status: DC | PRN

## 2015-02-26 MED ORDER — OXYCODONE HCL 5 MG PO TABS: 5.0000 mg | ORAL_TABLET | Freq: Once | ORAL | Status: DC

## 2015-02-26 MED ORDER — FENTANYL CITRATE (PF) 100 MCG/2ML IJ SOLN
INTRAMUSCULAR | Status: DC | PRN
  Administered 2015-02-26 (×2): 25 ug via INTRAVENOUS

## 2015-02-26 MED ORDER — DEXAMETHASONE SODIUM PHOSPHATE 10 MG/ML IJ SOLN
INTRAMUSCULAR | Status: DC | PRN
  Administered 2015-02-26: 10 mg via INTRAVENOUS

## 2015-02-26 MED ORDER — PROPOFOL 10 MG/ML IV BOLUS
INTRAVENOUS | Status: DC | PRN
  Administered 2015-02-26: 170 mg via INTRAVENOUS

## 2015-02-26 MED ORDER — OXYCODONE HCL 5 MG PO TABS: 5.0000 mg | ORAL_TABLET | Freq: Four times a day (QID) | ORAL | Status: DC | PRN

## 2015-02-26 MED ORDER — BUPIVACAINE-EPINEPHRINE 0.5% -1:200000 IJ SOLN
INTRAMUSCULAR | Status: DC | PRN
  Administered 2015-02-26: 15 mL

## 2015-02-26 MED ORDER — ONDANSETRON HCL 4 MG/2ML IJ SOLN
INTRAMUSCULAR | Status: DC | PRN
  Administered 2015-02-26: 4 mg via INTRAVENOUS

## 2015-02-26 MED ORDER — ONDANSETRON HCL 4 MG/2ML IJ SOLN
INTRAMUSCULAR | Status: AC
  Filled 2015-02-26: qty 2

## 2015-02-26 MED ORDER — PROPOFOL 10 MG/ML IV BOLUS
INTRAVENOUS | Status: AC
  Filled 2015-02-26: qty 20

## 2015-02-26 MED ORDER — LACTATED RINGERS IV SOLN
INTRAVENOUS | Status: DC
  Administered 2015-02-26: 1000 mL via INTRAVENOUS

## 2015-02-26 MED ORDER — LIDOCAINE HCL (CARDIAC) 20 MG/ML IV SOLN
INTRAVENOUS | Status: DC | PRN
  Administered 2015-02-26: 100 mg via INTRAVENOUS

## 2015-02-26 MED ORDER — LIDOCAINE HCL (CARDIAC) 20 MG/ML IV SOLN
INTRAVENOUS | Status: AC
  Filled 2015-02-26: qty 5

## 2015-02-26 SURGICAL SUPPLY — 39 items
APL SKNCLS STERI-STRIP NONHPOA (GAUZE/BANDAGES/DRESSINGS) ×1
APPLIER CLIP 9.375 MED OPEN (MISCELLANEOUS) ×2
APR CLP MED 9.3 20 MLT OPN (MISCELLANEOUS) ×1
BENZOIN TINCTURE PRP APPL 2/3 (GAUZE/BANDAGES/DRESSINGS) ×1 IMPLANT
BLADE HEX COATED 2.75 (ELECTRODE) ×2 IMPLANT
BLADE SURG 15 STRL LF DISP TIS (BLADE) ×2 IMPLANT
BLADE SURG 15 STRL SS (BLADE) ×4
BLADE SURG SZ10 CARB STEEL (BLADE) ×2 IMPLANT
CLIP APPLIE 9.375 MED OPEN (MISCELLANEOUS) IMPLANT
DECANTER SPIKE VIAL GLASS SM (MISCELLANEOUS) ×2 IMPLANT
DRAPE LAPAROSCOPIC ABDOMINAL (DRAPES) ×2 IMPLANT
DRAPE LAPAROTOMY TRNSV 102X78 (DRAPE) ×2 IMPLANT
DRSG TEGADERM 4X4.75 (GAUZE/BANDAGES/DRESSINGS) ×1 IMPLANT
ELECT REM PT RETURN 9FT ADLT (ELECTROSURGICAL) ×2
ELECTRODE REM PT RTRN 9FT ADLT (ELECTROSURGICAL) ×1 IMPLANT
GAUZE SPONGE 2X2 8PLY STRL LF (GAUZE/BANDAGES/DRESSINGS) IMPLANT
GAUZE SPONGE 4X4 12PLY STRL (GAUZE/BANDAGES/DRESSINGS) ×2 IMPLANT
GAUZE SPONGE 4X4 16PLY XRAY LF (GAUZE/BANDAGES/DRESSINGS) ×2 IMPLANT
GLOVE BIOGEL PI IND STRL 7.0 (GLOVE) ×1 IMPLANT
GLOVE BIOGEL PI INDICATOR 7.0 (GLOVE) ×5
GLOVE ECLIPSE 8.0 STRL XLNG CF (GLOVE) ×2 IMPLANT
GLOVE INDICATOR 8.0 STRL GRN (GLOVE) ×4 IMPLANT
GOWN STRL REUS W/TWL LRG LVL3 (GOWN DISPOSABLE) ×2 IMPLANT
GOWN STRL REUS W/TWL XL LVL3 (GOWN DISPOSABLE) ×4 IMPLANT
KIT BASIN OR (CUSTOM PROCEDURE TRAY) ×2 IMPLANT
MARKER SKIN DUAL TIP RULER LAB (MISCELLANEOUS) ×2 IMPLANT
NDL HYPO 25X1 1.5 SAFETY (NEEDLE) ×1 IMPLANT
NEEDLE HYPO 22GX1.5 SAFETY (NEEDLE) ×2 IMPLANT
NEEDLE HYPO 25X1 1.5 SAFETY (NEEDLE) ×2 IMPLANT
NS IRRIG 1000ML POUR BTL (IV SOLUTION) ×2 IMPLANT
PACK GENERAL/GYN (CUSTOM PROCEDURE TRAY) ×2 IMPLANT
PENCIL BUTTON HOLSTER BLD 10FT (ELECTRODE) ×2 IMPLANT
SOL PREP POV-IOD 4OZ 10% (MISCELLANEOUS) ×2 IMPLANT
SPONGE GAUZE 2X2 STER 10/PKG (GAUZE/BANDAGES/DRESSINGS) ×1
STRIP CLOSURE SKIN 1/2X4 (GAUZE/BANDAGES/DRESSINGS) ×1 IMPLANT
SUT VICRYL 4-0 (SUTURE) ×1 IMPLANT
SYR CONTROL 10ML LL (SYRINGE) ×2 IMPLANT
TOWEL OR 17X26 10 PK STRL BLUE (TOWEL DISPOSABLE) ×2 IMPLANT
YANKAUER SUCT BULB TIP 10FT TU (MISCELLANEOUS) ×2 IMPLANT

## 2015-02-26 NOTE — Anesthesia Preprocedure Evaluation (Signed)
Anesthesia Evaluation  Patient identified by MRN, date of birth, ID band Patient awake    Reviewed: Allergy & Precautions, NPO status , Patient's Chart, lab work & pertinent test results  History of Anesthesia Complications Negative for: history of anesthetic complications  Airway Mallampati: II  TM Distance: >3 FB Neck ROM: Full    Dental no notable dental hx. (+) Dental Advisory Given   Pulmonary asthma , Current Smoker,  breath sounds clear to auscultation  Pulmonary exam normal       Cardiovascular hypertension, Pt. on medications Normal cardiovascular examRhythm:Regular Rate:Normal     Neuro/Psych negative neurological ROS  negative psych ROS   GI/Hepatic negative GI ROS, Neg liver ROS,   Endo/Other  diabetes  Renal/GU negative Renal ROS  negative genitourinary   Musculoskeletal  (+) Arthritis -, Osteoarthritis,    Abdominal   Peds negative pediatric ROS (+)  Hematology negative hematology ROS (+)   Anesthesia Other Findings   Reproductive/Obstetrics negative OB ROS                             Anesthesia Physical Anesthesia Plan  ASA: II  Anesthesia Plan: General   Post-op Pain Management:    Induction: Intravenous  Airway Management Planned: LMA  Additional Equipment:   Intra-op Plan:   Post-operative Plan: Extubation in OR  Informed Consent: I have reviewed the patients History and Physical, chart, labs and discussed the procedure including the risks, benefits and alternatives for the proposed anesthesia with the patient or authorized representative who has indicated his/her understanding and acceptance.   Dental advisory given  Plan Discussed with: CRNA  Anesthesia Plan Comments:         Anesthesia Quick Evaluation

## 2015-02-26 NOTE — Anesthesia Postprocedure Evaluation (Signed)
  Anesthesia Post-op Note  Patient: [name]  Procedure(s) Performed: Procedure(s) (LRB): RIGHT AXILLARY LYMPH NODE BIOPSY (Right)  Patient Location: PACU  Anesthesia Type: General  Level of Consciousness: awake and alert   Airway and Oxygen Therapy: Patient Spontanous Breathing  Post-op Pain: mild  Post-op Assessment: Post-op Vital signs reviewed, Patient's Cardiovascular Status Stable, Respiratory Function Stable, Patent Airway and No signs of Nausea or vomiting  Last Vitals:  Filed Vitals:   02/26/15 1424  BP: 130/56  Pulse: 67  Temp: 36.6 C  Resp: 16    Post-op Vital Signs: stable   Complications: No apparent anesthesia complications

## 2015-02-26 NOTE — H&P (Signed)
[name] 02/19/2015 2:18 PM Location: [facility] Patient #: [id number] DOB: 27-May-1935 Widowed / Language: Cleophus Molt / Race: Black or African American Female  History of Present Illness Odis Hollingshead MD; 02/19/2015 2:35 PM) Patient words: pre op atypical lymphoid prolification.  The patient is a 80 year old female   Note:She is here for a preop visit to make sure the right axillary lymph node is still palpable. She is sore in the area.   Allergies Jeralyn Ruths, CMA; 02/19/2015 2:20 PM) No Known Drug Allergies05/09/2014  Medication History Jeralyn Ruths, CMA; 02/19/2015 2:20 PM) Antivert (25MG  Tablet, Oral daily) Active. Medications Reconciled Azor (5-20MG  Tablet, 1 Oral daily) Active. Aspirin (81MG  Tablet, 1 Oral daily) Active. Vitamin D3 (1000UNIT Tablet, 1 Oral daily) Active. GlipiZIDE ER (10MG  Tablet ER 24HR, Oral) Active. Glucotrol XL (10MG  Tablet ER, Oral daily) Active. Lantus SoloStar (100UNIT/ML Solution, Subcutaneous as directed) Active. Crestor (5MG  Tablet, 1 Oral daily) Active. Januvia (100MG  Tablet, 1 Oral daily) Active.  Vitals Jearld Fenton Morris CMA; 02/19/2015 2:20 PM) 02/19/2015 2:20 PM Weight: 146.2 lb Height: 63in Body Surface Area: 1.72 m Body Mass Index: 25.9 kg/m Temp.: 53F(Oral)  Pulse: 72 (Regular)  Resp.: 18 (Unlabored)  BP: 130/60 (Sitting, Left Arm, Standard)    Physical Exam Odis Hollingshead MD; 02/19/2015 2:35 PM) The physical exam findings are as follows: Note:Right axilla- 2 cm tender palpable right axillary mass.    Assessment & Plan Odis Hollingshead MD; 02/19/2015 2:34 PM) ATYPICAL LYMPHOPROLIFERATIVE DISORDER (238.79  D47.9) Impression: Enlarged right axillary lymph node is still palpable.  Plan: Proceed with excision right axillary lymph node biopsy next week.     Signed by Odis Hollingshead, MD

## 2015-02-26 NOTE — Op Note (Signed)
Operative Note  [name] female 80 y.o. 02/26/2015  PREOPERATIVE DX:  Right axillary lymphadenopathy with atypical lymphoid proliferation  POSTOPERATIVE DX:  Same  PROCEDURE:   Excisional biopsy of deep right axillary lymph node         Surgeon: Odis Hollingshead   Assistants: None  Anesthesia: General LMA anesthesia  Indications:   This is a 80 year old female who is noted to have Bilateral axillary lymphadenopathy on mammogram and ultrasound. Ultrasound-guided biopsy of the 2 cm lymph node on the right side demonstrated atypical lymphoid proliferation. Excisional biopsy is recommended to get more tissue for further pathologic examination. She now presents for that.    Procedure Detail:  She was seen in the holding area and the right axilla marked with my initials. She was brought to the operating room placed supine on the operating table and a general anesthetic was given. The right axillary area were sterilely prepped and draped.  Marcaine was infiltrated into the subcutaneous tissues. A transverse incision was made in the lower right axilla and the subcutaneous tissue divided with the cautery until the axillary content area was entered. I could palpate an enlarged lymph node and this was excised using electrocautery. This lymph node measured 2.2 cm. A smaller lymph node next to it was also removed. These were both sent fresh to pathology.  The wound was inspected and bleeding was controlled with electrocautery and hemoclips. Once hemostasis was adequate, the subcutaneous tissues were approximated with interrupted 3-0 Vicryl sutures. The skin was closed with a running 4-0 Monocryl subcuticular stitch. Steri-Strips and a sterile dressing were applied.  She tolerated the procedure well without any apparent complications and was taken to the recovery room in satisfactory condition.  Estimated Blood Loss:  less than 100 mL         Specimens: Deep right axillary lymph nodes         Complications:  * No complications entered in OR log *         Disposition: PACU - hemodynamically stable.         Condition: stable

## 2015-02-26 NOTE — Transfer of Care (Signed)
Immediate Anesthesia Transfer of Care Note  Patient: [name]  Procedure(s) Performed: Procedure(s): RIGHT AXILLARY LYMPH NODE BIOPSY (Right)  Patient Location: PACU  Anesthesia Type:General  Level of Consciousness: sedated  Airway & Oxygen Therapy: Patient Spontanous Breathing and Patient connected to face mask oxygen  Post-op Assessment: Report given to RN and Post -op Vital signs reviewed and stable  Post vital signs: Reviewed and stable  Last Vitals:  Filed Vitals:   02/26/15 0934  BP: 120/59  Pulse: 71  Temp: 37 C  Resp: 18    Complications: No apparent anesthesia complications

## 2015-02-26 NOTE — Discharge Instructions (Addendum)
Light activity with right arm for 1 week.  May shower tomorrow.  Apply ice pack to the area as much as possible for the next 2 days.  Remove bandage in 3 days. Leave Steri-Strips on until they fall off.  Call for heavy bleeding or other wound problems.  Take pain medication as directed, if needed.       General Anesthesia, Care After Refer to this sheet in the next few weeks. These instructions provide you with information on caring for yourself after your procedure. Your health care provider may also give you more specific instructions. Your treatment has been planned according to current medical practices, but problems sometimes occur. Call your health care provider if you have any problems or questions after your procedure. WHAT TO EXPECT AFTER THE PROCEDURE After the procedure, it is typical to experience:  Sleepiness.  Nausea and vomiting. HOME CARE INSTRUCTIONS  For the first 24 hours after general anesthesia:  Have a responsible person with you.  Do not drive a car. If you are alone, do not take public transportation.  Do not drink alcohol.  Do not take medicine that has not been prescribed by your health care provider.  Do not sign important papers or make important decisions.  You may resume a normal diet and activities as directed by your health care provider.  Change bandages (dressings) as directed.  If you have questions or problems that seem related to general anesthesia, call the hospital and ask for the anesthetist or anesthesiologist on call. SEEK MEDICAL CARE IF:  You have nausea and vomiting that continue the day after anesthesia.  You develop a rash. SEEK IMMEDIATE MEDICAL CARE IF:   You have difficulty breathing.  You have chest pain.  You have any allergic problems. Document Released: 11/16/2000 Document Revised: 08/15/2013 Document Reviewed: 02/23/2013 [facility] Patient Information 2015 [place], [place]. This information is not intended to  replace advice given to you by your health care provider. Make sure you discuss any questions you have with your health care provider.

## 2015-02-26 NOTE — Interval H&P Note (Signed)
History and Physical Interval Note:  02/26/2015 12:54 PM  [name]  has presented today for surgery, with the diagnosis of ATYPICAL LYMPHOID PROLIFERATION  The various methods of treatment have been discussed with the patient and family. After consideration of risks, benefits and other options for treatment, the patient has consented to  Procedure(s): RIGHT AXILLARY LYMPH NODE BIOPSY (Right) as a surgical intervention .  The patient's history has been reviewed, patient examined, no change in status, stable for surgery.  I have reviewed the patient's chart and labs.  Questions were answered to the patient's satisfaction.     Gennesis Hogland Lenna Sciara

## 2015-02-27 ENCOUNTER — Encounter (HOSPITAL_COMMUNITY): Payer: Self-pay | Admitting: General Surgery

## 2015-03-01 ENCOUNTER — Other Ambulatory Visit (HOSPITAL_COMMUNITY)
Admission: RE | Admit: 2015-03-01 | Discharge: 2015-03-01 | Disposition: A | Discharge status: Home / Self Care | Payer: Medicare Other | Source: Ambulatory Visit | Attending: General Surgery | Admitting: General Surgery

## 2015-03-01 DIAGNOSIS — R590 Localized enlarged lymph nodes: Secondary | ICD-10-CM | POA: Insufficient documentation

## 2015-03-11 ENCOUNTER — Encounter (HOSPITAL_COMMUNITY): Payer: Medicare Other

## 2015-03-11 ENCOUNTER — Encounter (HOSPITAL_COMMUNITY): Payer: Self-pay

## 2015-04-01 DIAGNOSIS — E0800 Diabetes mellitus due to underlying condition with hyperosmolarity without nonketotic hyperglycemic-hyperosmolar coma (NKHHC): Secondary | ICD-10-CM | POA: Diagnosis not present

## 2015-04-01 DIAGNOSIS — I10 Essential (primary) hypertension: Secondary | ICD-10-CM | POA: Diagnosis not present

## 2015-04-02 DIAGNOSIS — E782 Mixed hyperlipidemia: Secondary | ICD-10-CM | POA: Diagnosis not present

## 2015-04-02 DIAGNOSIS — J449 Chronic obstructive pulmonary disease, unspecified: Secondary | ICD-10-CM | POA: Diagnosis not present

## 2015-04-02 DIAGNOSIS — E0800 Diabetes mellitus due to underlying condition with hyperosmolarity without nonketotic hyperglycemic-hyperosmolar coma (NKHHC): Secondary | ICD-10-CM | POA: Diagnosis not present

## 2015-04-10 ENCOUNTER — Encounter (HOSPITAL_COMMUNITY): Payer: Self-pay | Admitting: General Surgery

## 2015-06-25 DIAGNOSIS — E1042 Type 1 diabetes mellitus with diabetic polyneuropathy: Secondary | ICD-10-CM | POA: Diagnosis not present

## 2015-06-27 DIAGNOSIS — H40013 Open angle with borderline findings, low risk, bilateral: Secondary | ICD-10-CM | POA: Diagnosis not present

## 2015-07-30 DIAGNOSIS — E0800 Diabetes mellitus due to underlying condition with hyperosmolarity without nonketotic hyperglycemic-hyperosmolar coma (NKHHC): Secondary | ICD-10-CM | POA: Diagnosis not present

## 2015-07-30 DIAGNOSIS — I10 Essential (primary) hypertension: Secondary | ICD-10-CM | POA: Diagnosis not present

## 2015-07-31 DIAGNOSIS — J441 Chronic obstructive pulmonary disease with (acute) exacerbation: Secondary | ICD-10-CM | POA: Diagnosis not present

## 2015-07-31 DIAGNOSIS — E118 Type 2 diabetes mellitus with unspecified complications: Secondary | ICD-10-CM | POA: Diagnosis not present

## 2015-07-31 DIAGNOSIS — I10 Essential (primary) hypertension: Secondary | ICD-10-CM | POA: Diagnosis not present

## 2015-09-10 DIAGNOSIS — M2011 Hallux valgus (acquired), right foot: Secondary | ICD-10-CM | POA: Diagnosis not present

## 2015-09-10 DIAGNOSIS — L603 Nail dystrophy: Secondary | ICD-10-CM | POA: Diagnosis not present

## 2015-09-10 DIAGNOSIS — M2012 Hallux valgus (acquired), left foot: Secondary | ICD-10-CM | POA: Diagnosis not present

## 2015-09-10 DIAGNOSIS — E1151 Type 2 diabetes mellitus with diabetic peripheral angiopathy without gangrene: Secondary | ICD-10-CM | POA: Diagnosis not present

## 2015-09-10 DIAGNOSIS — I739 Peripheral vascular disease, unspecified: Secondary | ICD-10-CM | POA: Diagnosis not present

## 2015-09-19 ENCOUNTER — Encounter: Payer: Self-pay | Admitting: General Surgery

## 2015-09-19 DIAGNOSIS — D479 Neoplasm of uncertain behavior of lymphoid, hematopoietic and related tissue, unspecified: Secondary | ICD-10-CM | POA: Diagnosis not present

## 2015-09-19 NOTE — Progress Notes (Signed)
[name] 09/19/2015 3:10 PM Location: [facility] Patient #: [id number] DOB: Oct 01, 1934 Widowed / Language: Cleophus Molt / Race: Black or African American Female  History of Present Illness Odis Hollingshead MD; 09/19/2015 3:48 PM) The patient is a 81 year old female.   Note:She presents for a follow-up visit of her atypical lymphoproliferative disorder. She underwent a right axillary lymph node biopsy back in July and that pathology was discovered. I decided to see her back again in 6 months for repeat examination. She feels well and has no complaints.  Allergies (Sonya Bynum, CMA; 09/19/2015 3:10 PM) No Known Drug Allergies 12/24/2014  Medication History (Sonya Bynum, CMA; 09/19/2015 3:11 PM) Visine (0.05% Solution, Ophthalmic) Active. Anoro Ellipta (62.5-25MCG/INH Aero Pow Br Act, Inhalation) Active. OxyCODONE HCl (5MG  Tablet, Oral) Active. Antivert (25MG  Tablet, Oral daily) Active. Azor (5-20MG  Tablet, 1 Oral daily) Active. Aspirin (81MG  Tablet, 1 Oral daily) Active. GlipiZIDE ER (10MG  Tablet ER 24HR, Oral) Active. Lantus SoloStar (100UNIT/ML Solution, Subcutaneous as directed) Active. Crestor (5MG  Tablet, 1 Oral daily) Active. Januvia (100MG  Tablet, 1 Oral daily) Active. Lantus SoloStar (100UNIT/ML Soln Pen-inj, Subcutaneous) Active. Medications Reconciled    Vitals (Sonya Bynum CMA; 09/19/2015 3:10 PM) 09/19/2015 3:10 PM Weight: 149 lb Height: 63in Body Surface Area: 1.71 m Body Mass Index: 26.39 kg/m  Temp.: 37F(Temporal)  Pulse: 76 (Regular)  BP: 126/80 (Sitting, Left Arm, Standard)      Physical Exam Odis Hollingshead MD; 09/19/2015 3:49 PM)  The physical exam findings are as follows: Note:General-well-developed, well-nourished in no acute distress.  Lymph nodes-no palpable cervical, supraclavicular, or axillary adenopathy. There is a well-healed right axillary scar.    Assessment & Plan Odis Hollingshead MD;  09/19/2015 3:49 PM)  ATYPICAL LYMPHOPROLIFERATIVE DISORDER (D47.9) Impression: She feels well and there is no clinically evident adenopathy on exam.  Plan: Referral to medical oncology to get their opinion on the atypical lymphoproliferative disorder and see if any other workup is needed.  Jackolyn Confer, MD

## 2015-09-24 ENCOUNTER — Telehealth: Payer: Self-pay | Admitting: Hematology

## 2015-09-24 NOTE — Telephone Encounter (Signed)
new patient appt-s/w patient and gave np appt for 02/09 @ 2:30 w/Dr. Burr Medico. Referring Dr. Zella Richer   Referral information scanned under media tab for review .

## 2015-10-03 ENCOUNTER — Ambulatory Visit (HOSPITAL_BASED_OUTPATIENT_CLINIC_OR_DEPARTMENT_OTHER): Payer: Medicare Other | Admitting: Hematology

## 2015-10-03 ENCOUNTER — Encounter: Payer: Self-pay | Admitting: Hematology

## 2015-10-03 ENCOUNTER — Telehealth: Payer: Self-pay | Admitting: Hematology

## 2015-10-03 ENCOUNTER — Ambulatory Visit (HOSPITAL_BASED_OUTPATIENT_CLINIC_OR_DEPARTMENT_OTHER): Payer: Medicare Other

## 2015-10-03 VITALS — BP 131/71 | HR 84 | Temp 98.1°F | Resp 18 | Ht 63.0 in | Wt 146.8 lb

## 2015-10-03 DIAGNOSIS — E119 Type 2 diabetes mellitus without complications: Secondary | ICD-10-CM

## 2015-10-03 DIAGNOSIS — I10 Essential (primary) hypertension: Secondary | ICD-10-CM | POA: Diagnosis not present

## 2015-10-03 DIAGNOSIS — D479 Neoplasm of uncertain behavior of lymphoid, hematopoietic and related tissue, unspecified: Secondary | ICD-10-CM

## 2015-10-03 LAB — COMPREHENSIVE METABOLIC PANEL
ALT: 13 U/L (ref 0–55)
ANION GAP: 11 meq/L (ref 3–11)
AST: 15 U/L (ref 5–34)
Albumin: 3.6 g/dL (ref 3.5–5.0)
Alkaline Phosphatase: 86 U/L (ref 40–150)
BUN: 8.5 mg/dL (ref 7.0–26.0)
CALCIUM: 9.4 mg/dL (ref 8.4–10.4)
CHLORIDE: 105 meq/L (ref 98–109)
CO2: 26 mEq/L (ref 22–29)
Creatinine: 1.1 mg/dL (ref 0.6–1.1)
EGFR: 55 mL/min/{1.73_m2} — ABNORMAL LOW (ref >90)
Glucose: 196 mg/dl — ABNORMAL HIGH (ref 70–140)
POTASSIUM: 4.1 meq/L (ref 3.5–5.1)
Sodium: 142 mEq/L (ref 136–145)
Total Bilirubin: 0.6 mg/dL (ref 0.20–1.20)
Total Protein: 7.8 g/dL (ref 6.4–8.3)

## 2015-10-03 LAB — TECHNOLOGIST REVIEW

## 2015-10-03 LAB — CBC WITH DIFFERENTIAL/PLATELET
BASO%: 0.5 % (ref 0.0–2.0)
BASOS ABS: 0 10*3/uL (ref 0.0–0.1)
EOS ABS: 0 10*3/uL (ref 0.0–0.5)
EOS%: 0.4 % (ref 0.0–7.0)
HCT: 40.8 % (ref 34.8–46.6)
HEMOGLOBIN: 13 g/dL (ref 11.6–15.9)
LYMPH#: 4.9 10*3/uL — AB (ref 0.9–3.3)
LYMPH%: 51.4 % — ABNORMAL HIGH (ref 14.0–49.7)
MCH: 27.2 pg (ref 25.1–34.0)
MCHC: 31.9 g/dL (ref 31.5–36.0)
MCV: 85.2 fL (ref 79.5–101.0)
MONO#: 0.9 10*3/uL (ref 0.1–0.9)
MONO%: 9.4 % (ref 0.0–14.0)
NEUT#: 3.7 10*3/uL (ref 1.5–6.5)
NEUT%: 38.3 % — AB (ref 38.4–76.8)
PLATELETS: 214 10*3/uL (ref 145–400)
RBC: 4.79 10*6/uL (ref 3.70–5.45)
RDW: 15.7 % — AB (ref 11.2–14.5)
WBC: 9.5 10*3/uL (ref 3.9–10.3)

## 2015-10-03 LAB — LACTATE DEHYDROGENASE: LDH: 158 U/L (ref 125–245)

## 2015-10-03 NOTE — Telephone Encounter (Signed)
Appointments made and avs printed. Patient prefered the [facility] for her CT abd/pelvis. Scheduling will contact patient to schedule appointments.

## 2015-10-03 NOTE — Progress Notes (Signed)
[place]  Telephone:([phone] Fax:([phone]) [phone]  Clinic New Consult Note   Patient Care Team: Lucianne Lei, MD as PCP - General (Family Medicine) 10/03/2015  REFERRAL PHYSICIAN: Dr. Zella Richer   CHIEF COMPLAINTS/PURPOSE OF CONSULTATION:  Lymphoproliferative disease  HISTORY OF PRESENTING ILLNESS:  [name] 81 y.o. female is here because of lymphoproliferative disease.  She had routine mammogram on 11/16/2014, which showed a possible bilateral axillary adenopathy, no breast mass. Ultrasound reviewed multiple lymph nodes in bilateral axilla with cortical thickening, the largest measuring 2 cm in the right axillary, and a 1.3 cm node in the left axilla. She was asymptomatic, and axillary lymph nodes are not palpable on exam. She underwent right axilla lymph node core needle biopsy, which showed atypical lymphoid proliferation. She was referred to general surgeon Dr. Harrington Challenger in bowel, and underwent surgical biopsy of the 2 right axillary lymph nodes on 02/26/2015. She tolerated the biopsy very well, no residual pain or other complications.  She had a follow-up visit with Dr. Zella Richer in 08/2015, and she was referred to [place] for further evaluation.  She is doing very well, denies any significant pain, dyspnea, or GI symptoms. She denies fever, chills, skin rash or itchiness, night sweats, or weight loss. She lives independently by herself, still works as a Set designer, and drives. She has 2 daughters and 1 son, who lives in [place] too.   MEDICAL HISTORY:  Past Medical History  Diagnosis Date  . Diabetes mellitus without complication ([place])   . Hypertension   . Asthma   . Arthritis     SURGICAL HISTORY: Past Surgical History  Procedure Laterality Date  . Abdominal hysterectomy  "many yrs ago"  . Axillary lymph node biopsy Right 02/26/2015    Procedure: RIGHT AXILLARY LYMPH NODE BIOPSY;  Surgeon: Jackolyn Confer, MD;  Location: [facility];  Service: General;   Laterality: Right;  . Cholecystectomy      SOCIAL HISTORY: Social History   Social History  . Marital Status: Married    Spouse Name: N/A  . Number of Children: N/A  . Years of Education: N/A   Occupational History  . Not on file.   Social History Main Topics  . Smoking status: Current Every Day Smoker -- 0.50 packs/day for 60 years  . Smokeless tobacco: Not on file  . Alcohol Use: No  . Drug Use: No  . Sexual Activity: Not on file   Other Topics Concern  . Not on file   Social History Narrative    FAMILY HISTORY: Family History  Problem Relation Age of Onset  . Diabetes Mother   . Diabetes Father   . Diabetes Sister   . Diabetes Brother     ALLERGIES:  has No Known Allergies.  MEDICATIONS:  Current Outpatient Prescriptions  Medication Sig Dispense Refill  . amLODipine-olmesartan (AZOR) 5-20 MG per tablet Take 1 tablet by mouth every morning.     Marland Kitchen aspirin 325 MG tablet Take 325 mg by mouth every morning.     . Cholecalciferol (VITAMIN D-3) 5000 UNITS TABS Take 1 capsule by mouth once a week. On fridays    . glipiZIDE (GLUCOTROL) 10 MG tablet Take 10 mg by mouth 2 (two) times daily.    . insulin glargine (LANTUS) 100 unit/mL SOPN Inject 14 Units into the skin at bedtime.     . metFORMIN (GLUCOPHAGE) 500 MG tablet Take 500 mg by mouth 2 (two) times daily with a meal.    . rosuvastatin (CRESTOR)  5 MG tablet Take 5 mg by mouth every morning.     . sitaGLIPtin (JANUVIA) 100 MG tablet Take 100 mg by mouth every morning.     . Tetrahydrozoline HCl (VISINE OP) Apply 2 drops to eye every morning.    Marland Kitchen Umeclidinium-Vilanterol (ANORO ELLIPTA) 62.5-25 MCG/INH AEPB Inhale 1 puff into the lungs daily as needed (for shortness of breath).    Marland Kitchen albuterol (PROVENTIL HFA;VENTOLIN HFA) 108 (90 BASE) MCG/ACT inhaler Inhale 1-2 puffs into the lungs every 6 (six) hours as needed for wheezing or shortness of breath. Reported on 10/03/2015     No current facility-administered medications  for this visit.    REVIEW OF SYSTEMS:   Constitutional: Denies fevers, chills or abnormal night sweats Eyes: Denies blurriness of vision, double vision or watery eyes Ears, nose, mouth, throat, and face: Denies mucositis or sore throat Respiratory: Denies cough, dyspnea or wheezes Cardiovascular: Denies palpitation, chest discomfort or lower extremity swelling Gastrointestinal:  Denies nausea, heartburn or change in bowel habits Skin: Denies abnormal skin rashes Lymphatics: Denies new lymphadenopathy or easy bruising Neurological:Denies numbness, tingling or new weaknesses Behavioral/Psych: Mood is stable, no new changes  All other systems were reviewed with the patient and are negative.  PHYSICAL EXAMINATION: ECOG PERFORMANCE STATUS: 0 - Asymptomatic  Filed Vitals:   10/03/15 1444  BP: 131/71  Pulse: 84  Temp: 98.1 F (36.7 C)  Resp: 18   Filed Weights   10/03/15 1444  Weight: 146 lb 12.8 oz (66.588 kg)    GENERAL:alert, no distress and comfortable SKIN: skin color, texture, turgor are normal, no rashes or significant lesions EYES: normal, conjunctiva are pink and non-injected, sclera clear OROPHARYNX:no exudate, no erythema and lips, buccal mucosa, and tongue normal  NECK: supple, thyroid normal size, non-tender, without nodularity LYMPH:  no palpable lymphadenopathy in the cervical, axillary or inguinal LUNGS: clear to auscultation and percussion with normal breathing effort HEART: regular rate & rhythm and no murmurs and no lower extremity edema ABDOMEN:abdomen soft, non-tender and normal bowel sounds Musculoskeletal:no cyanosis of digits and no clubbing  PSYCH: alert & oriented x 3 with fluent speech NEURO: no focal motor/sensory deficits  LABORATORY DATA:  I have reviewed the data as listed CBC Latest Ref Rng 10/03/2015 02/22/2015 01/29/2014  WBC 3.9 - 10.3 10e3/uL 9.5 10.2 9.0  Hemoglobin 11.6 - 15.9 g/dL 13.0 12.1 12.1  Hematocrit 34.8 - 46.6 % 40.8 38.4 37.1    Platelets 145 - 400 10e3/uL 214 264 214    CMP Latest Ref Rng 10/03/2015 02/22/2015 01/29/2014  Glucose 70 - 140 mg/dl 196(H) 108(H) 121(H)  BUN 7.0 - 26.0 mg/dL 8.'5 10 11  '$ Creatinine 0.6 - 1.1 mg/dL 1.1 0.93 0.91  Sodium 136 - 145 mEq/L 142 140 143  Potassium 3.5 - 5.1 mEq/L 4.1 4.7 4.1  Chloride 101 - 111 mmol/L - 104 104  CO2 22 - 29 mEq/L '26 27 28  '$ Calcium 8.4 - 10.4 mg/dL 9.4 9.5 9.6  Total Protein 6.4 - 8.3 g/dL 7.8 7.8 -  Total Bilirubin 0.20 - 1.20 mg/dL 0.60 0.7 -  Alkaline Phos 40 - 150 U/L 86 82 -  AST 5 - 34 U/L 15 19 -  ALT 0 - 55 U/L 13 15 -    Pathology report Diagnosis Lymph node, biopsy, 2.2 cm right axillary w/ second smaller node - ATYPICAL LYMPHOID PROLIFERATION, SEE COMMENT. Microscopic Comment Sections of lymph node show partial preservation of the lymph node architecture with open sinuses and scattered variably sized  reactive appearing germinal centers. In some areas however, there appears to be expansion of the marginal zone area by variably sized but significant clusters of monocytoid appearing cells consisting of small to medium sized lymphocytes with partially clumped chromatin, small nucleoli and moderately abundant amphophilic to clear cytoplasm. This is associated with deposition of hyalinized material in some areas. Some of the monocytoid areas do not necessarily appear to associate with reactive germinal centers. Numerous variably sized clusters of plasma cells area also seen in the background. Flow cytometric analysis was performed but failed to show any monoclonal B cell population or abnormal T cell phenotype ([id number]). Hence, immunohistochemical stains were performed including BCL-2, BCL-6, CD3, CD5, CD10, CD20, CD21, CD43, CD79a, CD138, kappa and lambda with appropriate controls. The expanded marginal zone areas show increased number of B cells as seen with CD20 and CD79a with no significant positivity for CD10, BCL-6, CD5, or CD43. CD10 and BCL-6  highlights scattered germinal centers in the background which are BCL-2 negative. CD21 highlights abundant dendritic networks which are somewhat expanded and irregular in many areas. CD138 highlights the abundant plasma cell component present throughout the lymph node. Kappa and lambda stains show an apparent polyclonal staining pattern in plasma cells. The overall histologic and immunophenotypic features are worrisome for partial/early involvement by a B cell lymphoproliferative process particularly marginal zone lymphoma. However, the lack of definite monoclonality by flow cytometric studies, possibly related to sampling, hinders definitive diagnosis of malignancy. As a result, gene rearrangement and FISH studies will be performed on a representative block and the results reported in an addendum. (BNS:gt, 03/01/15)  Tissue-Flow Cytometry - NO MONOCLONAL B CELL POPULATION OR ABNORMAL T CELL PHENOTYPE IDENTIFIED.   ADDITIONAL INFORMATION: For completeness, in situ hybridization for kappa and lambda light chains were performed on a representative block at [facility] in [place], [place]. The plasma cells display a polyclonal pattern similar to that seen with conventional immunoperoxidase stains. Susanne Greenhouse MD Pathologist, Electronic Signature ( Signed 03/19/2015)  Gene rearrangement and FISH studies were performed on a representative block at the [facility] in [place], [place]. FISH analysis failed to show IGH/BCL2 fusion or rearrangements of BCL6. In addition, no clonal Immunoglobulin Heavy locus (IGH) gene rearrangement was detected by DNA amplification using consensus primers to the Heavy locus variable (framework II and framework III) and joining regions. While a B-cell lymphoproliferative process cannot be confirmed, the above results do not necessarily exclude lymphoma since only 70% of B-cell lymphomas have clonal Immunoglobulin Heavy locus gene  rearrangements demonstrable with this technique. As a result, I strongly recommend close clinical follow up and if adenopathy is recurrent, excisional biopsy is recommended for further evaluation.   RADIOGRAPHIC STUDIES: I have personally reviewed the radiological images as listed and agreed with the findings in the report. No results found.  ASSESSMENT & PLAN: 81 year old African-American female, presented with asymptomatic image discovered bilateral axillary adenopathy.  1. Atypical lymphoid proliferation, rule out lymphoma or CLL  -she was found to have bilateral axillary adenopathy, non-bulky, she is asymptomatic.  -I reviewed her lymph node biopsy results with her in details. The biopsy is suspicious for lymphoproliferative disease, especially low grade indolent lymphoma, especially marginal zone lymphoma. However no evidence of B cell monoclonality by flow. The FISH studies were also negative for IGH/BCL2 fusion or rearrangements of BCL6, or IGH rearrangement. Certainly, this could be reactive change also, although she does not have any chronic inflammation on chest wall or breasts. -CLL is also a  possibility, her prior WBC was normal, will repeat CBC with diff today  -I recommend lab test with CBC, CMP, and LDH -I recommend a CT of chest, abdomen and pelvis to evaluate adenopathy, liver and spleen change  -She does not have any GI symptoms, MALT is less likely  -If her lab and CT scan are negative, I recommend follow-up every 6-12 months. -Her previous CBC was normal, I do not feel she needs a bone marrow biopsy. -We discussed that most indolent lymphoma may not require any treatment if asymptomatic and disease burden is low.   2. HTN, DM -She will continue follow-up with her primary care physician  Plan -Lab today with CBC with diff, CMP and LDH, will do flow if she has lymphocytosis  -CT chest, abdomen and pelvis with contrast in the next 2-3 weeks, I'll call her about a CT scan  results, I'll bring her back for follow-up if needed. -Otherwise I'll see her back in 6 months with lab and exam.   Orders Placed This Encounter  Procedures  . CT Abdomen Pelvis W Contrast    Standing Status: Future     Number of Occurrences:      Standing Expiration Date: 10/02/2016    Order Specific Question:  If indicated for the ordered procedure, I authorize the administration of contrast media per Radiology protocol    Answer:  Yes    Order Specific Question:  Reason for Exam (SYMPTOM  OR DIAGNOSIS REQUIRED)    Answer:  Ruled out lymphoma    Order Specific Question:  Preferred imaging location?    Answer:  [facility]  . CT Chest W Contrast    Standing Status: Future     Number of Occurrences:      Standing Expiration Date: 10/02/2016    Order Specific Question:  If indicated for the ordered procedure, I authorize the administration of contrast media per Radiology protocol    Answer:  Yes    Order Specific Question:  Reason for Exam (SYMPTOM  OR DIAGNOSIS REQUIRED)    Answer:  Ruled out lymphoma    Order Specific Question:  Preferred imaging location?    Answer:  [facility]  . CBC with Differential    Standing Status: Standing     Number of Occurrences: 10     Standing Expiration Date: 10/02/2020  . Comprehensive metabolic panel    Standing Status: Standing     Number of Occurrences: 10     Standing Expiration Date: 10/02/2020  . Lactate dehydrogenase (LDH) - [facility]    Standing Status: Standing     Number of Occurrences: 10     Standing Expiration Date: 10/02/2020    All questions were answered. The patient knows to call the clinic with any problems, questions or concerns. I spent 55 minutes counseling the patient face to face. The total time spent in the appointment was 60 minutes and more than 50% was on counseling.     Truitt Merle, MD 10/03/2015 8:18 PM

## 2015-10-04 ENCOUNTER — Telehealth: Payer: Self-pay | Admitting: Hematology

## 2015-10-04 NOTE — Telephone Encounter (Signed)
per pof to sch pt appt-adv pt [facility] all to sch scan

## 2015-10-14 ENCOUNTER — Ambulatory Visit (HOSPITAL_COMMUNITY)
Admission: RE | Admit: 2015-10-14 | Discharge: 2015-10-14 | Disposition: A | Discharge status: Home / Self Care | Payer: Medicare Other | Source: Ambulatory Visit | Attending: Hematology | Admitting: Hematology

## 2015-10-14 ENCOUNTER — Encounter (HOSPITAL_COMMUNITY): Payer: Self-pay

## 2015-10-14 DIAGNOSIS — R590 Localized enlarged lymph nodes: Secondary | ICD-10-CM | POA: Insufficient documentation

## 2015-10-14 DIAGNOSIS — R599 Enlarged lymph nodes, unspecified: Secondary | ICD-10-CM | POA: Diagnosis not present

## 2015-10-14 DIAGNOSIS — D479 Neoplasm of uncertain behavior of lymphoid, hematopoietic and related tissue, unspecified: Secondary | ICD-10-CM | POA: Diagnosis present

## 2015-10-14 DIAGNOSIS — R911 Solitary pulmonary nodule: Secondary | ICD-10-CM | POA: Insufficient documentation

## 2015-10-14 DIAGNOSIS — I700 Atherosclerosis of aorta: Secondary | ICD-10-CM | POA: Diagnosis not present

## 2015-10-14 MED ORDER — IOHEXOL 300 MG/ML  SOLN
100.0000 mL | Freq: Once | INTRAMUSCULAR | Status: AC | PRN
  Administered 2015-10-14: 100 mL via INTRAVENOUS

## 2015-10-16 ENCOUNTER — Other Ambulatory Visit: Payer: Self-pay | Admitting: *Deleted

## 2015-10-16 DIAGNOSIS — D479 Neoplasm of uncertain behavior of lymphoid, hematopoietic and related tissue, unspecified: Secondary | ICD-10-CM

## 2015-10-17 ENCOUNTER — Ambulatory Visit (HOSPITAL_BASED_OUTPATIENT_CLINIC_OR_DEPARTMENT_OTHER): Payer: Medicare Other | Admitting: Hematology

## 2015-10-17 ENCOUNTER — Encounter: Payer: Self-pay | Admitting: Hematology

## 2015-10-17 ENCOUNTER — Telehealth: Payer: Self-pay | Admitting: Hematology

## 2015-10-17 ENCOUNTER — Other Ambulatory Visit (HOSPITAL_BASED_OUTPATIENT_CLINIC_OR_DEPARTMENT_OTHER): Payer: Medicare Other

## 2015-10-17 ENCOUNTER — Other Ambulatory Visit (HOSPITAL_COMMUNITY)
Admission: RE | Admit: 2015-10-17 | Discharge: 2015-10-17 | Disposition: A | Discharge status: Home / Self Care | Payer: Medicare Other | Source: Ambulatory Visit | Attending: Hematology | Admitting: Hematology

## 2015-10-17 VITALS — BP 143/63 | HR 76 | Temp 98.6°F | Resp 18 | Ht 63.0 in | Wt 147.6 lb

## 2015-10-17 DIAGNOSIS — D479 Neoplasm of uncertain behavior of lymphoid, hematopoietic and related tissue, unspecified: Secondary | ICD-10-CM

## 2015-10-17 DIAGNOSIS — I10 Essential (primary) hypertension: Secondary | ICD-10-CM

## 2015-10-17 DIAGNOSIS — E119 Type 2 diabetes mellitus without complications: Secondary | ICD-10-CM | POA: Diagnosis not present

## 2015-10-17 LAB — CBC WITH DIFFERENTIAL/PLATELET
BASO%: 0.6 % (ref 0.0–2.0)
Basophils Absolute: 0.1 10*3/uL (ref 0.0–0.1)
EOS ABS: 0 10*3/uL (ref 0.0–0.5)
EOS%: 0.3 % (ref 0.0–7.0)
HCT: 42 % (ref 34.8–46.6)
HGB: 13.6 g/dL (ref 11.6–15.9)
LYMPH#: 5 10*3/uL — AB (ref 0.9–3.3)
LYMPH%: 49.4 % (ref 14.0–49.7)
MCH: 27.5 pg (ref 25.1–34.0)
MCHC: 32.4 g/dL (ref 31.5–36.0)
MCV: 84.8 fL (ref 79.5–101.0)
MONO#: 0.8 10*3/uL (ref 0.1–0.9)
MONO%: 7.7 % (ref 0.0–14.0)
NEUT#: 4.3 10*3/uL (ref 1.5–6.5)
NEUT%: 42 % (ref 38.4–76.8)
PLATELETS: 210 10*3/uL (ref 145–400)
RBC: 4.96 10*6/uL (ref 3.70–5.45)
RDW: 15.8 % — ABNORMAL HIGH (ref 11.2–14.5)
WBC: 10.1 10*3/uL (ref 3.9–10.3)

## 2015-10-17 LAB — COMPREHENSIVE METABOLIC PANEL
ALBUMIN: 3.6 g/dL (ref 3.5–5.0)
ALK PHOS: 92 U/L (ref 40–150)
ALT: 25 U/L (ref 0–55)
ANION GAP: 11 meq/L (ref 3–11)
AST: 19 U/L (ref 5–34)
BILIRUBIN TOTAL: 0.66 mg/dL (ref 0.20–1.20)
BUN: 7.1 mg/dL (ref 7.0–26.0)
CO2: 27 mEq/L (ref 22–29)
Calcium: 9.8 mg/dL (ref 8.4–10.4)
Chloride: 105 mEq/L (ref 98–109)
Creatinine: 1 mg/dL (ref 0.6–1.1)
EGFR: 61 mL/min/{1.73_m2} — AB (ref >90)
Glucose: 170 mg/dl — ABNORMAL HIGH (ref 70–140)
Potassium: 4.3 mEq/L (ref 3.5–5.1)
Sodium: 142 mEq/L (ref 136–145)
Total Protein: 7.9 g/dL (ref 6.4–8.3)

## 2015-10-17 LAB — LACTATE DEHYDROGENASE: LDH: 167 U/L (ref 125–245)

## 2015-10-17 LAB — TECHNOLOGIST REVIEW

## 2015-10-17 NOTE — Telephone Encounter (Signed)
Pt confirmed labs/ov per 02/23 POF, gave pt AVS and Calendar... KJ °

## 2015-10-17 NOTE — Progress Notes (Signed)
[facility]  [phone] [phone]  Clinic follow Up Note   Patient Care Team: Lucianne Lei, MD as PCP - General (Family Medicine) Jackolyn Confer, MD as Consulting Physician (General Surgery) Truitt Merle, MD as Consulting Physician (Hematology) 10/17/2015   CHIEF COMPLAINTS:  Follow up lymphoproliferative disease  HISTORY OF PRESENTING ILLNESS 92/04/2016)  [name] 81 y.o. female is here because of lymphoproliferative disease.  She had routine mammogram on 11/16/2014, which showed a possible bilateral axillary adenopathy, no breast mass. Ultrasound reviewed multiple lymph nodes in bilateral axilla with cortical thickening, the largest measuring 2 cm in the right axillary, and a 1.3 cm node in the left axilla. She was asymptomatic, and axillary lymph nodes are not palpable on exam. She underwent right axilla lymph node core needle biopsy, which showed atypical lymphoid proliferation. She was referred to general surgeon Dr. Harrington Challenger in bowel, and underwent surgical biopsy of the 2 right axillary lymph nodes on 02/26/2015. She tolerated the biopsy very well, no residual pain or other complications.  She had a follow-up visit with Dr. Zella Richer in 08/2015, and she was referred to [place] for further evaluation.  She is doing very well, denies any significant pain, dyspnea, or GI symptoms. She denies fever, chills, skin rash or itchiness, night sweats, or weight loss. She lives independently by herself, still works as a Set designer, and drives. She has 2 daughters and 1 son, who lives in [place] too.   CURRENT THERAPY: observation  INTERIM HISTORY: [name] returns for follow up and discuss test results. She is doing well, no new complains since her last visit.   MEDICAL HISTORY:  Past Medical History  Diagnosis Date  . Diabetes mellitus without complication ([place])   . Hypertension   . Asthma   . Arthritis     SURGICAL HISTORY: Past Surgical History    Procedure Laterality Date  . Abdominal hysterectomy  "many yrs ago"  . Axillary lymph node biopsy Right 02/26/2015    Procedure: RIGHT AXILLARY LYMPH NODE BIOPSY;  Surgeon: Jackolyn Confer, MD;  Location: [facility];  Service: General;  Laterality: Right;  . Cholecystectomy      SOCIAL HISTORY: Social History   Social History  . Marital Status: Married    Spouse Name: N/A  . Number of Children: N/A  . Years of Education: N/A   Occupational History  . Not on file.   Social History Main Topics  . Smoking status: Current Every Day Smoker -- 0.50 packs/day for 60 years  . Smokeless tobacco: Not on file  . Alcohol Use: No  . Drug Use: No  . Sexual Activity: Not on file   Other Topics Concern  . Not on file   Social History Narrative    FAMILY HISTORY: Family History  Problem Relation Age of Onset  . Diabetes Mother   . Diabetes Father   . Diabetes Sister   . Diabetes Brother     ALLERGIES:  has No Known Allergies.  MEDICATIONS:  Current Outpatient Prescriptions  Medication Sig Dispense Refill  . amLODipine-olmesartan (AZOR) 5-20 MG per tablet Take 1 tablet by mouth every morning.     Marland Kitchen aspirin 325 MG tablet Take 325 mg by mouth every morning.     . Cholecalciferol (VITAMIN D-3) 5000 UNITS TABS Take 1 capsule by mouth once a week. On fridays    . glipiZIDE (GLUCOTROL) 10 MG tablet Take 10 mg by mouth 2 (two) times daily.    . insulin  glargine (LANTUS) 100 unit/mL SOPN Inject 14 Units into the skin at bedtime.     . metFORMIN (GLUCOPHAGE) 500 MG tablet Take 500 mg by mouth 2 (two) times daily with a meal.    . rosuvastatin (CRESTOR) 5 MG tablet Take 5 mg by mouth every morning.     . sitaGLIPtin (JANUVIA) 100 MG tablet Take 100 mg by mouth every morning.     . Tetrahydrozoline HCl (VISINE OP) Apply 2 drops to eye every morning.    Marland Kitchen Umeclidinium-Vilanterol (ANORO ELLIPTA) 62.5-25 MCG/INH AEPB Inhale 1 puff into the lungs daily as needed (for shortness of breath).     No  current facility-administered medications for this visit.    REVIEW OF SYSTEMS:   Constitutional: Denies fevers, chills or abnormal night sweats Eyes: Denies blurriness of vision, double vision or watery eyes Ears, nose, mouth, throat, and face: Denies mucositis or sore throat Respiratory: Denies cough, dyspnea or wheezes Cardiovascular: Denies palpitation, chest discomfort or lower extremity swelling Gastrointestinal:  Denies nausea, heartburn or change in bowel habits Skin: Denies abnormal skin rashes Lymphatics: Denies new lymphadenopathy or easy bruising Neurological:Denies numbness, tingling or new weaknesses Behavioral/Psych: Mood is stable, no new changes  All other systems were reviewed with the patient and are negative.  PHYSICAL EXAMINATION: ECOG PERFORMANCE STATUS: 0 - Asymptomatic  Filed Vitals:   10/17/15 0909  BP: 143/63  Pulse: 76  Temp: 98.6 F (37 C)  Resp: 18   Filed Weights   10/17/15 0909  Weight: 147 lb 9.6 oz (66.951 kg)    GENERAL:alert, no distress and comfortable SKIN: skin color, texture, turgor are normal, no rashes or significant lesions EYES: normal, conjunctiva are pink and non-injected, sclera clear OROPHARYNX:no exudate, no erythema and lips, buccal mucosa, and tongue normal  NECK: supple, thyroid normal size, non-tender, without nodularity LYMPH:  no palpable lymphadenopathy in the cervical, axillary or inguinal LUNGS: clear to auscultation and percussion with normal breathing effort HEART: regular rate & rhythm and no murmurs and no lower extremity edema ABDOMEN:abdomen soft, non-tender and normal bowel sounds Musculoskeletal:no cyanosis of digits and no clubbing  PSYCH: alert & oriented x 3 with fluent speech NEURO: no focal motor/sensory deficits  LABORATORY DATA:  I have reviewed the data as listed CBC Latest Ref Rng 10/17/2015 10/03/2015 02/22/2015  WBC 3.9 - 10.3 10e3/uL 10.1 9.5 10.2  Hemoglobin 11.6 - 15.9 g/dL 13.6 13.0 12.1    Hematocrit 34.8 - 46.6 % 42.0 40.8 38.4  Platelets 145 - 400 10e3/uL 210 214 264    CMP Latest Ref Rng 10/17/2015 10/03/2015 02/22/2015  Glucose 70 - 140 mg/dl 170(H) 196(H) 108(H)  BUN 7.0 - 26.0 mg/dL 7.1 8.5 10  Creatinine 0.6 - 1.1 mg/dL 1.0 1.1 0.93  Sodium 136 - 145 mEq/L 142 142 140  Potassium 3.5 - 5.1 mEq/L 4.3 4.1 4.7  Chloride 101 - 111 mmol/L - - 104  CO2 22 - 29 mEq/L '27 26 27  '$ Calcium 8.4 - 10.4 mg/dL 9.8 9.4 9.5  Total Protein 6.4 - 8.3 g/dL 7.9 7.8 7.8  Total Bilirubin 0.20 - 1.20 mg/dL 0.66 0.60 0.7  Alkaline Phos 40 - 150 U/L 92 86 82  AST 5 - 34 U/L '19 15 19  '$ ALT 0 - 55 U/L '25 13 15   '$ Lactate dehydrogenase (LDH) - [facility]  Status: Finalresult Visible to patient:  Not Released Nextappt: Today at 08:45 AM in Oncology [facility] 6) Dx:  Lymphoproliferative disease ([place])  Ref Range 2wk ago    LDH 125 - 245 U/L 158         Pathology report Diagnosis Lymph node, biopsy, 2.2 cm right axillary w/ second smaller node - ATYPICAL LYMPHOID PROLIFERATION, SEE COMMENT. Microscopic Comment Sections of lymph node show partial preservation of the lymph node architecture with open sinuses and scattered variably sized reactive appearing germinal centers. In some areas however, there appears to be expansion of the marginal zone area by variably sized but significant clusters of monocytoid appearing cells consisting of small to medium sized lymphocytes with partially clumped chromatin, small nucleoli and moderately abundant amphophilic to clear cytoplasm. This is associated with deposition of hyalinized material in some areas. Some of the monocytoid areas do not necessarily appear to associate with reactive germinal centers. Numerous variably sized clusters of plasma cells area also seen in the background. Flow cytometric analysis was performed but failed to show any monoclonal B cell population or abnormal T cell phenotype ([id number]). Hence,  immunohistochemical stains were performed including BCL-2, BCL-6, CD3, CD5, CD10, CD20, CD21, CD43, CD79a, CD138, kappa and lambda with appropriate controls. The expanded marginal zone areas show increased number of B cells as seen with CD20 and CD79a with no significant positivity for CD10, BCL-6, CD5, or CD43. CD10 and BCL-6 highlights scattered germinal centers in the background which are BCL-2 negative. CD21 highlights abundant dendritic networks which are somewhat expanded and irregular in many areas. CD138 highlights the abundant plasma cell component present throughout the lymph node. Kappa and lambda stains show an apparent polyclonal staining pattern in plasma cells.  The overall histologic and immunophenotypic features are worrisome for partial/early involvement by a B cell lymphoproliferative process particularly marginal zone lymphoma. However, the lack of definite monoclonality by flow cytometric studies, possibly related to sampling, hinders definitive diagnosis of malignancy. As a result, gene rearrangement and FISH studies will be performed on a representative block and the results reported in an addendum. (BNS:gt, 03/01/15)  Tissue-Flow Cytometry - NO MONOCLONAL B CELL POPULATION OR ABNORMAL T CELL PHENOTYPE IDENTIFIED.   ADDITIONAL INFORMATION: For completeness, in situ hybridization for kappa and lambda light chains were performed on a representative block at [facility] in [place], [place]. The plasma cells display a polyclonal pattern similar to that seen with conventional immunoperoxidase stains.  Susanne Greenhouse MD Pathologist, Electronic Signature ( Signed 03/19/2015)   RADIOGRAPHIC STUDIES: I have personally reviewed the radiological images as listed and agreed with the findings in the report. Ct Chest W Contrast  10/15/2015  CLINICAL DATA:  Axillary lymphadenopathy. EXAM: CT CHEST ABDOMEN WITH CONTRAST TECHNIQUE: Multidetector CT imaging of the chest, abdomen  was performed following the standard protocol during bolus administration of intravenous contrast. CONTRAST:  127m OMNIPAQUE IOHEXOL 300 MG/ML  SOLN COMPARISON:  None available FINDINGS: CT CHEST Mediastinum/Nodes: Enlarged RIGHT axial lymph nodes. Example, 18 mm short axis RIGHT axial lymph node. Lymphadenectomy clips in the RIGHT axilla. Similar enlarged LEFT axial lymph nodes. Example 12 mm short axis lymph node on image 11, series 5. No supraclavicular adenopathy. No mediastinal hilar adenopathy. No pericardial fluid. Esophagus normal. Lungs/Pleura: Small calcified nodule in the RIGHT lower lobe measures 2 mm on image 35 series). Airways are normal. CT ABDOMEN AND PELVIS Hepatobiliary: No focal hepatic lesion. No duct dilatation. Normal gallbladder Pancreas: Normal pancreatic parenchymal intensity. No ductal dilatation or inflammation. Spleen: Normal spleen. Adrenals/urinary tract: Adrenal glands and kidneys are normal. Stomach/Bowel: Stomach and limited of the small bowel is unremarkable Vascular/Lymphatic: No or upper  abdominal adenopathy. No periportal adenopathy. No retroperitoneal adenopathy. Musculoskeletal: No aggressive osseous lesion IMPRESSION: Chest Impression: 1. Bilateral mild-to-moderate axillary lymphadenopathy. 2. No mediastinal adenopathy. Abdomen / Pelvis Impression: 1. No upper abdominal adenopathy. 2. Normal volume spleen. 3.  Atherosclerotic calcification of the abdominal aorta. Electronically Signed   By: Genevive Bi M.D.   On: 10/15/2015 08:06   Ct Abdomen Pelvis W Contrast  10/15/2015  CLINICAL DATA:  Axillary lymphadenopathy. EXAM: CT CHEST ABDOMEN WITH CONTRAST TECHNIQUE: Multidetector CT imaging of the chest, abdomen was performed following the standard protocol during bolus administration of intravenous contrast. CONTRAST:  OMNIPAQUE IOHEXOL 300 MG/ML  SOLN COMPARISON:  None available FINDINGS: CT CHEST Mediastinum/Nodes: Enlarged RIGHT axial lymph nodes. Example, 18 mm  short axis RIGHT axial lymph node. Lymphadenectomy clips in the RIGHT axilla. Similar enlarged LEFT axial lymph nodes. Example 12 mm short axis lymph node on image 11, series 5. No supraclavicular adenopathy. No mediastinal hilar adenopathy. No pericardial fluid. Esophagus normal. Lungs/Pleura: Small calcified nodule in the RIGHT lower lobe measures 2 mm on image 35 series). Airways are normal. CT ABDOMEN AND PELVIS Hepatobiliary: No focal hepatic lesion. No duct dilatation. Normal gallbladder Pancreas: Normal pancreatic parenchymal intensity. No ductal dilatation or inflammation. Spleen: Normal spleen. Adrenals/urinary tract: Adrenal glands and kidneys are normal. Stomach/Bowel: Stomach and limited of the small bowel is unremarkable Vascular/Lymphatic: No or upper abdominal adenopathy. No periportal adenopathy. No retroperitoneal adenopathy. Musculoskeletal: No aggressive osseous lesion IMPRESSION: Chest Impression: 1. Bilateral mild-to-moderate axillary lymphadenopathy. 2. No mediastinal adenopathy. Abdomen / Pelvis Impression: 1. No upper abdominal adenopathy. 2. Normal volume spleen. 3.  Atherosclerotic calcification of the abdominal aorta. Electronically Signed   By: Genevive Bi M.D.   On: 10/15/2015 08:06    ASSESSMENT & PLAN: 81 year old African-American female, presented with asymptomatic image discovered bilateral axillary adenopathy.  1. Atypical lymphoid proliferation, rule out lymphoma or CLL  -she was found to have bilateral axillary adenopathy, non-bulky, she is asymptomatic.  -I reviewed her lymph node biopsy results with her in details. The biopsy is suspicious for lymphoproliferative disease, especially low grade indolent lymphoma, especially marginal zone lymphoma. However no evidence of B cell monoclonality by flow. The FISH studies were also negative for IGH/BCL2 fusion or rearrangements of BCL6, or IGH rearrangement. Certainly, this could be reactive change also, although she does  not have any chronic inflammation on chest wall or breasts. -CLL is also a possibility, she has been having increased lymphocytosis (4.8-5K) -Her lab test CMP, LDH and CBC otherwise unremarkable -I discussed her CT scan findings, which showed bilateral axillary adenopathy, non-bulky, and the size appears to be similar to axillary ultrasound in June 2016, no other adenopathy on CT  -I did flow cytometry on peripheral blood today, the results still pending. Even if she had a monoclonal B-cell population on the flow, which supports CLL, she will unlikely need treatment. -I recommend routine follow up and observation   2. HTN, DM -She will continue follow-up with her primary care physician  Plan -Return to clinic with lab CBC, CMP and LDH in 6 months   Orders Placed This Encounter  Procedures  . CBC with Differential    Standing Status: Standing     Number of Occurrences: 20     Standing Expiration Date: 10/16/2020  . Comprehensive metabolic panel    Standing Status: Standing     Number of Occurrences: 10     Standing Expiration Date: 10/16/2020  . Lactate dehydrogenase (LDH) - [facility]  Standing Status: Standing     Number of Occurrences: 10     Standing Expiration Date: 10/16/2020    All questions were answered. The patient knows to call the clinic with any problems, questions or concerns. I spent 20 minutes counseling the patient face to face. The total time spent in the appointment was 25 minutes and more than 50% was on counseling.     Truitt Merle, MD 10/17/2015 9:04 PM

## 2015-10-22 LAB — FLOW CYTOMETRY

## 2015-11-04 ENCOUNTER — Telehealth: Payer: Self-pay | Admitting: Hematology

## 2015-11-04 NOTE — Telephone Encounter (Signed)
I called patient and discussed her flow cytometry from peripheral blood, which was negative for monoclonal lymphocytes. No evidence of CLL. She appreciated the call.  Truitt Merle  11/04/2015

## 2015-11-04 NOTE — Telephone Encounter (Signed)
Patient called back with family member with questions about what she was told.  This nurse advised herwhite blood cells are normal, she is negative for Chronic Lymphocytic Leukemia.  No change in appointments.

## 2015-11-19 DIAGNOSIS — L988 Other specified disorders of the skin and subcutaneous tissue: Secondary | ICD-10-CM | POA: Diagnosis not present

## 2015-11-19 DIAGNOSIS — L603 Nail dystrophy: Secondary | ICD-10-CM | POA: Diagnosis not present

## 2015-11-19 DIAGNOSIS — E1151 Type 2 diabetes mellitus with diabetic peripheral angiopathy without gangrene: Secondary | ICD-10-CM | POA: Diagnosis not present

## 2015-11-19 DIAGNOSIS — I739 Peripheral vascular disease, unspecified: Secondary | ICD-10-CM | POA: Diagnosis not present

## 2015-12-04 DIAGNOSIS — E089 Diabetes mellitus due to underlying condition without complications: Secondary | ICD-10-CM | POA: Diagnosis not present

## 2015-12-04 DIAGNOSIS — I10 Essential (primary) hypertension: Secondary | ICD-10-CM | POA: Diagnosis not present

## 2015-12-04 DIAGNOSIS — E782 Mixed hyperlipidemia: Secondary | ICD-10-CM | POA: Diagnosis not present

## 2015-12-04 DIAGNOSIS — J449 Chronic obstructive pulmonary disease, unspecified: Secondary | ICD-10-CM | POA: Diagnosis not present

## 2015-12-16 DIAGNOSIS — E1142 Type 2 diabetes mellitus with diabetic polyneuropathy: Secondary | ICD-10-CM | POA: Diagnosis not present

## 2015-12-26 DIAGNOSIS — E119 Type 2 diabetes mellitus without complications: Secondary | ICD-10-CM | POA: Diagnosis not present

## 2015-12-26 DIAGNOSIS — H2511 Age-related nuclear cataract, right eye: Secondary | ICD-10-CM | POA: Diagnosis not present

## 2015-12-26 DIAGNOSIS — H43812 Vitreous degeneration, left eye: Secondary | ICD-10-CM | POA: Diagnosis not present

## 2015-12-26 DIAGNOSIS — H40013 Open angle with borderline findings, low risk, bilateral: Secondary | ICD-10-CM | POA: Diagnosis not present

## 2016-03-17 DIAGNOSIS — E1142 Type 2 diabetes mellitus with diabetic polyneuropathy: Secondary | ICD-10-CM | POA: Diagnosis not present

## 2016-04-02 ENCOUNTER — Telehealth: Payer: Self-pay | Admitting: Hematology

## 2016-04-02 ENCOUNTER — Ambulatory Visit (HOSPITAL_BASED_OUTPATIENT_CLINIC_OR_DEPARTMENT_OTHER): Payer: Medicare Other | Admitting: Hematology

## 2016-04-02 ENCOUNTER — Encounter: Payer: Self-pay | Admitting: Hematology

## 2016-04-02 ENCOUNTER — Other Ambulatory Visit (HOSPITAL_BASED_OUTPATIENT_CLINIC_OR_DEPARTMENT_OTHER): Payer: Medicare Other

## 2016-04-02 VITALS — BP 134/72 | HR 79 | Temp 98.2°F | Resp 18 | Ht 63.0 in | Wt 146.2 lb

## 2016-04-02 DIAGNOSIS — I10 Essential (primary) hypertension: Secondary | ICD-10-CM

## 2016-04-02 DIAGNOSIS — D479 Neoplasm of uncertain behavior of lymphoid, hematopoietic and related tissue, unspecified: Secondary | ICD-10-CM | POA: Diagnosis not present

## 2016-04-02 DIAGNOSIS — E119 Type 2 diabetes mellitus without complications: Secondary | ICD-10-CM | POA: Diagnosis not present

## 2016-04-02 LAB — CBC WITH DIFFERENTIAL/PLATELET
BASO%: 0.3 % (ref 0.0–2.0)
Basophils Absolute: 0 10*3/uL (ref 0.0–0.1)
EOS%: 0.7 % (ref 0.0–7.0)
Eosinophils Absolute: 0.1 10*3/uL (ref 0.0–0.5)
HEMATOCRIT: 38.9 % (ref 34.8–46.6)
HGB: 13.1 g/dL (ref 11.6–15.9)
LYMPH#: 5.1 10*3/uL — AB (ref 0.9–3.3)
LYMPH%: 54.3 % — AB (ref 14.0–49.7)
MCH: 28.4 pg (ref 25.1–34.0)
MCHC: 33.7 g/dL (ref 31.5–36.0)
MCV: 84.2 fL (ref 79.5–101.0)
MONO#: 0.9 10*3/uL (ref 0.1–0.9)
MONO%: 9.1 % (ref 0.0–14.0)
NEUT%: 35.6 % — AB (ref 38.4–76.8)
NEUTROS ABS: 3.4 10*3/uL (ref 1.5–6.5)
Platelets: 205 10*3/uL (ref 145–400)
RBC: 4.62 10*6/uL (ref 3.70–5.45)
RDW: 15.2 % — ABNORMAL HIGH (ref 11.2–14.5)
WBC: 9.5 10*3/uL (ref 3.9–10.3)

## 2016-04-02 LAB — COMPREHENSIVE METABOLIC PANEL
ALK PHOS: 91 U/L (ref 40–150)
ALT: 12 U/L (ref 0–55)
ANION GAP: 11 meq/L (ref 3–11)
AST: 14 U/L (ref 5–34)
Albumin: 3.4 g/dL — ABNORMAL LOW (ref 3.5–5.0)
BUN: 10.3 mg/dL (ref 7.0–26.0)
CALCIUM: 9.9 mg/dL (ref 8.4–10.4)
CO2: 26 mEq/L (ref 22–29)
CREATININE: 1.2 mg/dL — AB (ref 0.6–1.1)
Chloride: 104 mEq/L (ref 98–109)
EGFR: 52 mL/min/{1.73_m2} — AB (ref >90)
Glucose: 222 mg/dl — ABNORMAL HIGH (ref 70–140)
Potassium: 4.1 mEq/L (ref 3.5–5.1)
Sodium: 141 mEq/L (ref 136–145)
TOTAL PROTEIN: 7.6 g/dL (ref 6.4–8.3)
Total Bilirubin: 0.51 mg/dL (ref 0.20–1.20)

## 2016-04-02 LAB — TECHNOLOGIST REVIEW

## 2016-04-02 LAB — LACTATE DEHYDROGENASE: LDH: 156 U/L (ref 125–245)

## 2016-04-02 NOTE — Progress Notes (Signed)
[facility]  [phone] [phone]  Clinic follow Up Note   Patient Care Team: Lucianne Lei, MD as PCP - General (Family Medicine) Jackolyn Confer, MD as Consulting Physician (General Surgery) Truitt Merle, MD as Consulting Physician (Hematology) 04/02/2016   CHIEF COMPLAINTS:  Follow up lymphoproliferative disease  HISTORY OF PRESENTING ILLNESS 92/04/2016)  [name] 81 y.o. female is here because of lymphoproliferative disease.  She had routine mammogram on 11/16/2014, which showed a possible bilateral axillary adenopathy, no breast mass. Ultrasound reviewed multiple lymph nodes in bilateral axilla with cortical thickening, the largest measuring 2 cm in the right axillary, and a 1.3 cm node in the left axilla. She was asymptomatic, and axillary lymph nodes are not palpable on exam. She underwent right axilla lymph node core needle biopsy, which showed atypical lymphoid proliferation. She was referred to general surgeon Dr. Harrington Challenger in bowel, and underwent surgical biopsy of the 2 right axillary lymph nodes on 02/26/2015. She tolerated the biopsy very well, no residual pain or other complications.  She had a follow-up visit with Dr. Zella Richer in 08/2015, and she was referred to [place] for further evaluation.  She is doing very well, denies any significant pain, dyspnea, or GI symptoms. She denies fever, chills, skin rash or itchiness, night sweats, or weight loss. She lives independently by herself, still works as a Set designer, and drives. She has 2 daughters and 1 son, who lives in [place] too.   CURRENT THERAPY: observation  INTERIM HISTORY: Ms Bousquet returns for follow up. She was last seen by me 6 months ago. 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 She denies any significant pain, no fever, chills, skin rash, weight  loss or other symptoms.  MEDICAL HISTORY:  Past Medical History:  Diagnosis Date  . Arthritis   . Asthma   . Diabetes mellitus without complication ([place])   . Hypertension     SURGICAL HISTORY: Past Surgical History:  Procedure Laterality Date  . ABDOMINAL HYSTERECTOMY  "many yrs ago"  . AXILLARY LYMPH NODE BIOPSY Right 02/26/2015   Procedure: RIGHT AXILLARY LYMPH NODE BIOPSY;  Surgeon: Jackolyn Confer, MD;  Location: [facility];  Service: General;  Laterality: Right;  . CHOLECYSTECTOMY      SOCIAL HISTORY: Social History   Social History  . Marital status: Married    Spouse name: N/A  . Number of children: N/A  . Years of education: N/A   Occupational History  . Not on file.   Social History Main Topics  . Smoking status: Current Every Day Smoker    Packs/day: 0.50    Years: 60.00  . Smokeless tobacco: Never Used  . Alcohol use No  . Drug use: No  . Sexual activity: Not on file   Other Topics Concern  . Not on file   Social History Narrative  . No narrative on file    FAMILY HISTORY: Family History  Problem Relation Age of Onset  . Diabetes Mother   . Diabetes Father   . Diabetes Sister   . Diabetes Brother     ALLERGIES:  has No Known Allergies.  MEDICATIONS:  Current Outpatient Prescriptions  Medication Sig Dispense Refill  . amLODipine-olmesartan (AZOR) 5-20 MG per tablet Take 1 tablet by mouth every morning.     Marland Kitchen aspirin 325 MG tablet Take  325 mg by mouth every morning.     . Cholecalciferol (VITAMIN D-3) 5000 UNITS TABS Take 1 capsule by mouth once a week. On fridays    . glipiZIDE (GLUCOTROL) 10 MG tablet Take 10 mg by mouth 2 (two) times daily.    . insulin glargine (LANTUS) 100 unit/mL SOPN Inject 14 Units into the skin at bedtime.     . metFORMIN (GLUCOPHAGE) 500 MG tablet Take 500 mg by mouth 2 (two) times daily with a meal.    . rosuvastatin (CRESTOR) 5 MG tablet Take 5 mg by mouth every morning.     . sitaGLIPtin (JANUVIA) 100 MG tablet Take  100 mg by mouth every morning.     Marland Kitchen Umeclidinium-Vilanterol (ANORO ELLIPTA) 62.5-25 MCG/INH AEPB Inhale 1 puff into the lungs daily as needed (for shortness of breath).    . Tetrahydrozoline HCl (VISINE OP) Apply 2 drops to eye every morning.     No current facility-administered medications for this visit.     REVIEW OF SYSTEMS:   Constitutional: Denies fevers, chills or abnormal night sweats Eyes: Denies blurriness of vision, double vision or watery eyes Ears, nose, mouth, throat, and face: Denies mucositis or sore throat Respiratory: Denies cough, dyspnea or wheezes Cardiovascular: Denies palpitation, chest discomfort or lower extremity swelling Gastrointestinal:  Denies nausea, heartburn or change in bowel habits Skin: Denies abnormal skin rashes Lymphatics: Denies new lymphadenopathy or easy bruising Neurological:Denies numbness, tingling or new weaknesses Behavioral/Psych: Mood is stable, no new changes  All other systems were reviewed with the patient and are negative.  PHYSICAL EXAMINATION: ECOG PERFORMANCE STATUS: 0 - Asymptomatic  Vitals:   04/02/16 1559  BP: 134/72  Pulse: 79  Resp: 18  Temp: 98.2 F (36.8 C)   Filed Weights   04/02/16 1559  Weight: 146 lb 3.2 oz (66.3 kg)    GENERAL:alert, no distress and comfortable SKIN: skin color, texture, turgor are normal, no rashes or significant lesions EYES: normal, conjunctiva are pink and non-injected, sclera clear OROPHARYNX:no exudate, no erythema and lips, buccal mucosa, and tongue normal  NECK: supple, thyroid normal size, non-tender, without nodularity LYMPH:  no palpable lymphadenopathy in the cervical, axillary or inguinal LUNGS: clear to auscultation and percussion with normal breathing effort HEART: regular rate & rhythm and no murmurs and no lower extremity edema ABDOMEN:abdomen soft, non-tender and normal bowel sounds Musculoskeletal:no cyanosis of digits and no clubbing  PSYCH: alert & oriented x 3 with  fluent speech NEURO: no focal motor/sensory deficits  LABORATORY DATA:  I have reviewed the data as listed CBC Latest Ref Rng & Units 04/02/2016 10/17/2015 10/03/2015  WBC 3.9 - 10.3 10e3/uL 9.5 10.1 9.5  Hemoglobin 11.6 - 15.9 g/dL 13.1 13.6 13.0  Hematocrit 34.8 - 46.6 % 38.9 42.0 40.8  Platelets 145 - 400 10e3/uL 205 210 214    CMP Latest Ref Rng & Units 10/17/2015 10/03/2015 02/22/2015  Glucose 70 - 140 mg/dl 170(H) 196(H) 108(H)  BUN 7.0 - 26.0 mg/dL 7.1 8.5 10  Creatinine 0.6 - 1.1 mg/dL 1.0 1.1 0.93  Sodium 136 - 145 mEq/L 142 142 140  Potassium 3.5 - 5.1 mEq/L 4.3 4.1 4.7  Chloride 101 - 111 mmol/L - - 104  CO2 22 - 29 mEq/L '27 26 27  '$ Calcium 8.4 - 10.4 mg/dL 9.8 9.4 9.5  Total Protein 6.4 - 8.3 g/dL 7.9 7.8 7.8  Total Bilirubin 0.20 - 1.20 mg/dL 0.66 0.60 0.7  Alkaline Phos 40 - 150 U/L 92 86 82  AST  5 - 34 U/L '19 15 19  '$ ALT 0 - 55 U/L '25 13 15   '$ CMP and LDH from today are still pending  CMP and  Interpretation 10/17/2015 Peripheral Blood Flow Cytometry - NO MONOCLONAL B-CELL POPULATION OR ABNORMAL T-CELL PHENOTYPE IDENTIFIED.  Pathology report Diagnosis Lymph node, biopsy, 2.2 cm right axillary w/ second smaller node - ATYPICAL LYMPHOID PROLIFERATION, SEE COMMENT. Microscopic Comment Sections of lymph node show partial preservation of the lymph node architecture with open sinuses and scattered variably sized reactive appearing germinal centers. In some areas however, there appears to be expansion of the marginal zone area by variably sized but significant clusters of monocytoid appearing cells consisting of small to medium sized lymphocytes with partially clumped chromatin, small nucleoli and moderately abundant amphophilic to clear cytoplasm. This is associated with deposition of hyalinized material in some areas. Some of the monocytoid areas do not necessarily appear to associate with reactive germinal centers. Numerous variably sized clusters of plasma cells area also seen in  the background. Flow cytometric analysis was performed but failed to show any monoclonal B cell population or abnormal T cell phenotype ([id number]). Hence, immunohistochemical stains were performed including BCL-2, BCL-6, CD3, CD5, CD10, CD20, CD21, CD43, CD79a, CD138, kappa and lambda with appropriate controls. The expanded marginal zone areas show increased number of B cells as seen with CD20 and CD79a with no significant positivity for CD10, BCL-6, CD5, or CD43. CD10 and BCL-6 highlights scattered germinal centers in the background which are BCL-2 negative. CD21 highlights abundant dendritic networks which are somewhat expanded and irregular in many areas. CD138 highlights the abundant plasma cell component present throughout the lymph node. Kappa and lambda stains show an apparent polyclonal staining pattern in plasma cells.  The overall histologic and immunophenotypic features are worrisome for partial/early involvement by a B cell lymphoproliferative process particularly marginal zone lymphoma. However, the lack of definite monoclonality by flow cytometric studies, possibly related to sampling, hinders definitive diagnosis of malignancy. As a result, gene rearrangement and FISH studies will be performed on a representative block and the results reported in an addendum. (BNS:gt, 03/01/15)  Tissue-Flow Cytometry - NO MONOCLONAL B CELL POPULATION OR ABNORMAL T CELL PHENOTYPE IDENTIFIED.   ADDITIONAL INFORMATION: For completeness, in situ hybridization for kappa and lambda light chains were performed on a representative block at [facility] in [place], [place]. The plasma cells display a polyclonal pattern similar to that seen with conventional immunoperoxidase stains.  Susanne Greenhouse MD Pathologist, Electronic Signature ( Signed 03/19/2015)   RADIOGRAPHIC STUDIES: I have personally reviewed the radiological images as listed and agreed with the findings in the report. No results  found.  ASSESSMENT & PLAN: 81 year old African-American female, presented with asymptomatic image discovered bilateral axillary adenopathy.  1. Atypical lymphoid proliferation, rule out lymphoma or CLL  -she was found to have bilateral axillary adenopathy, non-bulky, she is asymptomatic.  -I reviewed her lymph node biopsy results with her in details. The biopsy is suspicious for lymphoproliferative disease, especially low grade indolent lymphoma, especially marginal zone lymphoma. However no evidence of B cell monoclonality by flow. The FISH studies were also negative for IGH/BCL2 fusion or rearrangements of BCL6, or IGH rearrangement. Certainly, this could be reactive change also, although she does not have any chronic inflammation on chest wall or breasts. -CLL is also a possibility, she has been having increased lymphocytosis (4.8-5K), blood flow cytometry was negative for monoclonal B cells -Her lab test CMP, LDH and CBC otherwise unremarkable -I discussed her  CT scan from 09/2015, which showed bilateral axillary adenopathy, non-bulky, and the size appears to be similar to axillary ultrasound in June 2016, no other adenopathy on CT  -She is clinically doing very well, no B symptoms, physical exam was unremarkable, no palpable adenopathy. -I recommend her to repeat a bilateral mammogram and ultrasound of axillas, to evaluate her adenopathy. -I'll plan to see her back in one year with repeat lab.  2. HTN, DM -She will continue follow-up with her primary care physician  Plan -Bilateral diagnostic mammogram and ultrasound of axillas in the next month. She will call me after the test to review the test results. -Return to clinic with lab CBC, CMP and LDH in 12 months   Orders Placed This Encounter  Procedures  . MM DIAG BREAST TOMO BILATERAL    Standing Status:   Future    Standing Expiration Date:   06/02/2017    Order Specific Question:   Reason for Exam (SYMPTOM  OR DIAGNOSIS REQUIRED)     Answer:   follow up axilalry adenopathy    Order Specific Question:   Preferred imaging location?    Answer:   [facility]  . [place] [facility]    Standing Status:   Future    Standing Expiration Date:   06/02/2017    Order Specific Question:   Reason for Exam (SYMPTOM  OR DIAGNOSIS REQUIRED)    Answer:   follow up axillary adenopathy    Order Specific Question:   Preferred imaging location?    Answer:   [facility]  . [place] [facility]    Standing Status:   Future    Standing Expiration Date:   06/02/2017    Order Specific Question:   Reason for Exam (SYMPTOM  OR DIAGNOSIS REQUIRED)    Answer:   follow up axillary adenopathy    Order Specific Question:   Preferred imaging location?    Answer:   [facility]    All questions were answered. The patient knows to call the clinic with any problems, questions or concerns. I spent 20 minutes counseling the patient face to face. The total time spent in the appointment was 25 minutes and more than 50% was on counseling.     Truitt Merle, MD 04/02/2016 4:14 PM

## 2016-04-02 NOTE — Telephone Encounter (Signed)
per pof to sch pt appt-gave pt copy of avs-sch mammo

## 2016-04-06 DIAGNOSIS — E782 Mixed hyperlipidemia: Secondary | ICD-10-CM | POA: Diagnosis not present

## 2016-04-06 DIAGNOSIS — J441 Chronic obstructive pulmonary disease with (acute) exacerbation: Secondary | ICD-10-CM | POA: Diagnosis not present

## 2016-04-06 DIAGNOSIS — I10 Essential (primary) hypertension: Secondary | ICD-10-CM | POA: Diagnosis not present

## 2016-04-06 DIAGNOSIS — E0800 Diabetes mellitus due to underlying condition with hyperosmolarity without nonketotic hyperglycemic-hyperosmolar coma (NKHHC): Secondary | ICD-10-CM | POA: Diagnosis not present

## 2016-04-07 DIAGNOSIS — E782 Mixed hyperlipidemia: Secondary | ICD-10-CM | POA: Diagnosis not present

## 2016-04-07 DIAGNOSIS — J441 Chronic obstructive pulmonary disease with (acute) exacerbation: Secondary | ICD-10-CM | POA: Diagnosis not present

## 2016-04-07 DIAGNOSIS — E089 Diabetes mellitus due to underlying condition without complications: Secondary | ICD-10-CM | POA: Diagnosis not present

## 2016-04-07 DIAGNOSIS — D479 Neoplasm of uncertain behavior of lymphoid, hematopoietic and related tissue, unspecified: Secondary | ICD-10-CM | POA: Diagnosis not present

## 2016-04-15 ENCOUNTER — Ambulatory Visit
Admission: RE | Admit: 2016-04-15 | Discharge: 2016-04-15 | Disposition: A | Discharge status: Home / Self Care | Payer: Medicare Other | Source: Ambulatory Visit | Attending: Hematology | Admitting: Hematology

## 2016-04-15 DIAGNOSIS — N6489 Other specified disorders of breast: Secondary | ICD-10-CM | POA: Diagnosis not present

## 2016-04-15 DIAGNOSIS — D479 Neoplasm of uncertain behavior of lymphoid, hematopoietic and related tissue, unspecified: Secondary | ICD-10-CM

## 2016-04-15 DIAGNOSIS — R928 Other abnormal and inconclusive findings on diagnostic imaging of breast: Secondary | ICD-10-CM | POA: Diagnosis not present

## 2016-05-22 DIAGNOSIS — E039 Hypothyroidism, unspecified: Secondary | ICD-10-CM | POA: Diagnosis not present

## 2016-06-09 DIAGNOSIS — I739 Peripheral vascular disease, unspecified: Secondary | ICD-10-CM | POA: Diagnosis not present

## 2016-06-09 DIAGNOSIS — L603 Nail dystrophy: Secondary | ICD-10-CM | POA: Diagnosis not present

## 2016-06-09 DIAGNOSIS — E1151 Type 2 diabetes mellitus with diabetic peripheral angiopathy without gangrene: Secondary | ICD-10-CM | POA: Diagnosis not present

## 2016-07-02 DIAGNOSIS — Z79899 Other long term (current) drug therapy: Secondary | ICD-10-CM | POA: Diagnosis not present

## 2016-07-02 DIAGNOSIS — I10 Essential (primary) hypertension: Secondary | ICD-10-CM | POA: Diagnosis not present

## 2016-07-02 DIAGNOSIS — J441 Chronic obstructive pulmonary disease with (acute) exacerbation: Secondary | ICD-10-CM | POA: Diagnosis not present

## 2016-07-02 DIAGNOSIS — D479 Neoplasm of uncertain behavior of lymphoid, hematopoietic and related tissue, unspecified: Secondary | ICD-10-CM | POA: Diagnosis not present

## 2016-07-02 DIAGNOSIS — E782 Mixed hyperlipidemia: Secondary | ICD-10-CM | POA: Diagnosis not present

## 2016-07-02 DIAGNOSIS — E039 Hypothyroidism, unspecified: Secondary | ICD-10-CM | POA: Diagnosis not present

## 2016-07-03 DIAGNOSIS — I10 Essential (primary) hypertension: Secondary | ICD-10-CM | POA: Diagnosis not present

## 2016-07-03 DIAGNOSIS — E118 Type 2 diabetes mellitus with unspecified complications: Secondary | ICD-10-CM | POA: Diagnosis not present

## 2016-07-03 DIAGNOSIS — J449 Chronic obstructive pulmonary disease, unspecified: Secondary | ICD-10-CM | POA: Diagnosis not present

## 2016-07-03 DIAGNOSIS — E782 Mixed hyperlipidemia: Secondary | ICD-10-CM | POA: Diagnosis not present

## 2016-08-04 DIAGNOSIS — H2511 Age-related nuclear cataract, right eye: Secondary | ICD-10-CM | POA: Diagnosis not present

## 2016-08-04 DIAGNOSIS — H43812 Vitreous degeneration, left eye: Secondary | ICD-10-CM | POA: Diagnosis not present

## 2016-08-04 DIAGNOSIS — E119 Type 2 diabetes mellitus without complications: Secondary | ICD-10-CM | POA: Diagnosis not present

## 2016-08-04 DIAGNOSIS — H40013 Open angle with borderline findings, low risk, bilateral: Secondary | ICD-10-CM | POA: Diagnosis not present

## 2016-09-08 DIAGNOSIS — L603 Nail dystrophy: Secondary | ICD-10-CM | POA: Diagnosis not present

## 2016-09-08 DIAGNOSIS — E1051 Type 1 diabetes mellitus with diabetic peripheral angiopathy without gangrene: Secondary | ICD-10-CM | POA: Diagnosis not present

## 2016-09-08 DIAGNOSIS — I739 Peripheral vascular disease, unspecified: Secondary | ICD-10-CM | POA: Diagnosis not present

## 2016-11-02 DIAGNOSIS — D479 Neoplasm of uncertain behavior of lymphoid, hematopoietic and related tissue, unspecified: Secondary | ICD-10-CM | POA: Diagnosis not present

## 2016-11-02 DIAGNOSIS — I10 Essential (primary) hypertension: Secondary | ICD-10-CM | POA: Diagnosis not present

## 2016-11-02 DIAGNOSIS — E039 Hypothyroidism, unspecified: Secondary | ICD-10-CM | POA: Diagnosis not present

## 2016-11-02 DIAGNOSIS — E0800 Diabetes mellitus due to underlying condition with hyperosmolarity without nonketotic hyperglycemic-hyperosmolar coma (NKHHC): Secondary | ICD-10-CM | POA: Diagnosis not present

## 2016-11-02 DIAGNOSIS — E782 Mixed hyperlipidemia: Secondary | ICD-10-CM | POA: Diagnosis not present

## 2016-11-03 DIAGNOSIS — E0800 Diabetes mellitus due to underlying condition with hyperosmolarity without nonketotic hyperglycemic-hyperosmolar coma (NKHHC): Secondary | ICD-10-CM | POA: Diagnosis not present

## 2016-11-03 DIAGNOSIS — E038 Other specified hypothyroidism: Secondary | ICD-10-CM | POA: Diagnosis not present

## 2016-11-03 DIAGNOSIS — J441 Chronic obstructive pulmonary disease with (acute) exacerbation: Secondary | ICD-10-CM | POA: Diagnosis not present

## 2016-11-03 DIAGNOSIS — E782 Mixed hyperlipidemia: Secondary | ICD-10-CM | POA: Diagnosis not present

## 2016-12-01 DIAGNOSIS — E1042 Type 1 diabetes mellitus with diabetic polyneuropathy: Secondary | ICD-10-CM | POA: Diagnosis not present

## 2016-12-29 DIAGNOSIS — H2511 Age-related nuclear cataract, right eye: Secondary | ICD-10-CM | POA: Diagnosis not present

## 2016-12-29 DIAGNOSIS — H04123 Dry eye syndrome of bilateral lacrimal glands: Secondary | ICD-10-CM | POA: Diagnosis not present

## 2016-12-29 DIAGNOSIS — E119 Type 2 diabetes mellitus without complications: Secondary | ICD-10-CM | POA: Diagnosis not present

## 2016-12-29 DIAGNOSIS — H40023 Open angle with borderline findings, high risk, bilateral: Secondary | ICD-10-CM | POA: Diagnosis not present

## 2017-01-04 DIAGNOSIS — J441 Chronic obstructive pulmonary disease with (acute) exacerbation: Secondary | ICD-10-CM | POA: Diagnosis not present

## 2017-01-04 DIAGNOSIS — E782 Mixed hyperlipidemia: Secondary | ICD-10-CM | POA: Diagnosis not present

## 2017-01-04 DIAGNOSIS — E038 Other specified hypothyroidism: Secondary | ICD-10-CM | POA: Diagnosis not present

## 2017-01-04 DIAGNOSIS — E0800 Diabetes mellitus due to underlying condition with hyperosmolarity without nonketotic hyperglycemic-hyperosmolar coma (NKHHC): Secondary | ICD-10-CM | POA: Diagnosis not present

## 2017-01-05 DIAGNOSIS — E089 Diabetes mellitus due to underlying condition without complications: Secondary | ICD-10-CM | POA: Diagnosis not present

## 2017-01-05 DIAGNOSIS — E038 Other specified hypothyroidism: Secondary | ICD-10-CM | POA: Diagnosis not present

## 2017-02-23 DIAGNOSIS — E1051 Type 1 diabetes mellitus with diabetic peripheral angiopathy without gangrene: Secondary | ICD-10-CM | POA: Diagnosis not present

## 2017-02-23 DIAGNOSIS — I739 Peripheral vascular disease, unspecified: Secondary | ICD-10-CM | POA: Diagnosis not present

## 2017-02-23 DIAGNOSIS — L603 Nail dystrophy: Secondary | ICD-10-CM | POA: Diagnosis not present

## 2017-04-01 ENCOUNTER — Ambulatory Visit (HOSPITAL_BASED_OUTPATIENT_CLINIC_OR_DEPARTMENT_OTHER): Payer: Medicare Other | Admitting: Hematology

## 2017-04-01 ENCOUNTER — Other Ambulatory Visit (HOSPITAL_BASED_OUTPATIENT_CLINIC_OR_DEPARTMENT_OTHER): Payer: Medicare Other

## 2017-04-01 ENCOUNTER — Encounter: Payer: Self-pay | Admitting: Hematology

## 2017-04-01 ENCOUNTER — Telehealth: Payer: Self-pay | Admitting: Hematology

## 2017-04-01 VITALS — BP 171/74 | HR 66 | Temp 98.0°F | Resp 20 | Ht 63.0 in | Wt 144.1 lb

## 2017-04-01 DIAGNOSIS — D479 Neoplasm of uncertain behavior of lymphoid, hematopoietic and related tissue, unspecified: Secondary | ICD-10-CM

## 2017-04-01 DIAGNOSIS — E119 Type 2 diabetes mellitus without complications: Secondary | ICD-10-CM

## 2017-04-01 DIAGNOSIS — I10 Essential (primary) hypertension: Secondary | ICD-10-CM

## 2017-04-01 LAB — CBC WITH DIFFERENTIAL/PLATELET
BASO%: 0.2 % (ref 0.0–2.0)
BASOS ABS: 0 10*3/uL (ref 0.0–0.1)
EOS ABS: 0 10*3/uL (ref 0.0–0.5)
EOS%: 0.3 % (ref 0.0–7.0)
HCT: 39.5 % (ref 34.8–46.6)
HEMOGLOBIN: 13.3 g/dL (ref 11.6–15.9)
LYMPH%: 54.4 % — ABNORMAL HIGH (ref 14.0–49.7)
MCH: 29.4 pg (ref 25.1–34.0)
MCHC: 33.7 g/dL (ref 31.5–36.0)
MCV: 87.2 fL (ref 79.5–101.0)
MONO#: 0.8 10*3/uL (ref 0.1–0.9)
MONO%: 7.7 % (ref 0.0–14.0)
NEUT#: 3.7 10*3/uL (ref 1.5–6.5)
NEUT%: 37.4 % — ABNORMAL LOW (ref 38.4–76.8)
Platelets: 229 10*3/uL (ref 145–400)
RBC: 4.53 10*6/uL (ref 3.70–5.45)
RDW: 14.4 % (ref 11.2–14.5)
WBC: 9.9 10*3/uL (ref 3.9–10.3)
lymph#: 5.4 10*3/uL — ABNORMAL HIGH (ref 0.9–3.3)

## 2017-04-01 LAB — TECHNOLOGIST REVIEW

## 2017-04-01 NOTE — Telephone Encounter (Signed)
Scheduled appt per 8/9 los - Gave patient AVS and calender per los.  

## 2017-04-01 NOTE — Progress Notes (Signed)
[facility]  [phone] [phone]  Clinic follow Up Note   Patient Care Team: Lucianne Lei, MD as PCP - General (Family Medicine) Jackolyn Confer, MD as Consulting Physician (General Surgery) Truitt Merle, MD as Consulting Physician (Hematology) 04/01/2017   CHIEF COMPLAINTS:  Follow up lymphoproliferative disease  HISTORY OF PRESENTING ILLNESS 92/04/2016)  [name] 82 y.o. female is here because of lymphoproliferative disease.  She had routine mammogram on 11/16/2014, which showed a possible bilateral axillary adenopathy, no breast mass. Ultrasound reviewed multiple lymph nodes in bilateral axilla with cortical thickening, the largest measuring 2 cm in the right axillary, and a 1.3 cm node in the left axilla. She was asymptomatic, and axillary lymph nodes are not palpable on exam. She underwent right axilla lymph node core needle biopsy, which showed atypical lymphoid proliferation. She was referred to general surgeon Dr. Harrington Challenger in bowel, and underwent surgical biopsy of the 2 right axillary lymph nodes on 02/26/2015. She tolerated the biopsy very well, no residual pain or other complications.  She had a follow-up visit with Dr. Zella Richer in 08/2015, and she was referred to [place] for further evaluation.  She is doing very well, denies any significant pain, dyspnea, or GI symptoms. She denies fever, chills, skin rash or itchiness, night sweats, or weight loss. She lives independently by herself, still works as a Set designer, and drives. She has 2 daughters and 1 son, who lives in [place] too.   CURRENT THERAPY: observation  INTERIM HISTORY: [name] returns for follow up. She has been well and denies anything new since last year. 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 She reports her BP was elevated because she was nervous and did not sleep last night. She denies any headaches, night sweats or fever. She has been managing her blood sugar well  ranging from 85-110. She gets her exercise in by walking.   MEDICAL HISTORY:  Past Medical History:  Diagnosis Date  . Arthritis   . Asthma   . Diabetes mellitus without complication ([place])   . Hypertension     SURGICAL HISTORY: Past Surgical History:  Procedure Laterality Date  . ABDOMINAL HYSTERECTOMY  "many yrs ago"  . AXILLARY LYMPH NODE BIOPSY Right 02/26/2015   Procedure: RIGHT AXILLARY LYMPH NODE BIOPSY;  Surgeon: Jackolyn Confer, MD;  Location: [facility];  Service: General;  Laterality: Right;  . CHOLECYSTECTOMY      SOCIAL HISTORY: Social History   Social History  . Marital status: Married    Spouse name: N/A  . Number of children: N/A  . Years of education: N/A   Occupational History  . Not on file.   Social History Main Topics  . Smoking status: Current Every Day Smoker    Packs/day: 0.50    Years: 60.00  . Smokeless tobacco: Never Used  . Alcohol use No  . Drug use: No  . Sexual activity: Not on file   Other Topics Concern  . Not on file   Social History Narrative  . No narrative on file    FAMILY HISTORY: Family History  Problem Relation Age of Onset  . Diabetes Mother   . Diabetes Father   . Diabetes Sister   . Diabetes Brother     ALLERGIES:  has No Known Allergies.  MEDICATIONS:  Current Outpatient Prescriptions  Medication Sig Dispense Refill  . amLODipine-olmesartan (AZOR) 5-20 MG per tablet Take 1 tablet by mouth every morning.     Marland Kitchen  aspirin 325 MG tablet Take 325 mg by mouth every morning.     . Cholecalciferol (VITAMIN D-3) 5000 UNITS TABS Take 1 capsule by mouth once a week. On fridays    . glipiZIDE (GLUCOTROL) 10 MG tablet Take 10 mg by mouth 2 (two) times daily.    . insulin glargine (LANTUS) 100 unit/mL SOPN Inject 14 Units into the skin at bedtime.     Marland Kitchen levothyroxine (SYNTHROID, LEVOTHROID) 50 MCG tablet Take 1 tablet by mouth daily.    . metFORMIN (GLUCOPHAGE) 500 MG tablet Take 500 mg by mouth 2 (two) times daily with a  meal.    . RESTASIS MULTIDOSE 0.05 % ophthalmic emulsion Apply 1 drop to eye 2 (two) times daily. 1 drop in each eye  BID.    Marland Kitchen rosuvastatin (CRESTOR) 5 MG tablet Take 5 mg by mouth every morning.     . sitaGLIPtin (JANUVIA) 100 MG tablet Take 100 mg by mouth every morning.     . Tetrahydrozoline HCl (VISINE OP) Apply 2 drops to eye every morning.    Marland Kitchen Umeclidinium-Vilanterol (ANORO ELLIPTA) 62.5-25 MCG/INH AEPB Inhale 1 puff into the lungs daily as needed (for shortness of breath).     No current facility-administered medications for this visit.     REVIEW OF SYSTEMS:   Constitutional: Denies fevers, chills or abnormal night sweats Eyes: Denies blurriness of vision, double vision or watery eyes Ears, nose, mouth, throat, and face: Denies mucositis or sore throat Respiratory: Denies cough, dyspnea or wheezes Cardiovascular: Denies palpitation, chest discomfort or lower extremity swelling Gastrointestinal:  Denies nausea, heartburn or change in bowel habits Skin: Denies abnormal skin rashes Lymphatics: Denies new lymphadenopathy or easy bruising Neurological:Denies numbness, tingling or new weaknesses Behavioral/Psych: Mood is stable, no new changes  All other systems were reviewed with the patient and are negative.  PHYSICAL EXAMINATION:  ECOG PERFORMANCE STATUS: 0 - Asymptomatic  Vitals:   04/01/17 0830  BP: (!) 171/74  Pulse: 66  Resp: 20  Temp: 98 F (36.7 C)  SpO2: 97%   Filed Weights   04/01/17 0830  Weight: 144 lb 1.6 oz (65.4 kg)    GENERAL:alert, no distress and comfortable SKIN: skin color, texture, turgor are normal, no rashes or significant lesions EYES: normal, conjunctiva are pink and non-injected, sclera clear OROPHARYNX:no exudate, no erythema and lips, buccal mucosa, and tongue normal  NECK: supple, thyroid normal size, non-tender, without nodularity LYMPH:  no palpable lymphadenopathy in the cervical, axillary or inguinal area  LUNGS: clear to  auscultation and percussion with normal breathing effort HEART: regular rate & rhythm and no murmurs and no lower extremity edema ABDOMEN:abdomen soft, non-tender and normal bowel sounds Musculoskeletal:no cyanosis of digits and no clubbing  PSYCH: alert & oriented x 3 with fluent speech NEURO: no focal motor/sensory deficits  LABORATORY DATA:  I have reviewed the data as listed CBC Latest Ref Rng & Units 04/01/2017 04/02/2016 10/17/2015  WBC 3.9 - 10.3 10e3/uL 9.9 9.5 10.1  Hemoglobin 11.6 - 15.9 g/dL 13.3 13.1 13.6  Hematocrit 34.8 - 46.6 % 39.5 38.9 42.0  Platelets 145 - 400 10e3/uL 229 205 210    CMP Latest Ref Rng & Units 04/02/2016 10/17/2015 10/03/2015  Glucose 70 - 140 mg/dl 222(H) 170(H) 196(H)  BUN 7.0 - 26.0 mg/dL 10.3 7.1 8.5  Creatinine 0.6 - 1.1 mg/dL 1.2(H) 1.0 1.1  Sodium 136 - 145 mEq/L 141 142 142  Potassium 3.5 - 5.1 mEq/L 4.1 4.3 4.1  Chloride 101 - 111  mmol/L - - -  CO2 22 - 29 mEq/L '26 27 26  '$ Calcium 8.4 - 10.4 mg/dL 9.9 9.8 9.4  Total Protein 6.4 - 8.3 g/dL 7.6 7.9 7.8  Total Bilirubin 0.20 - 1.20 mg/dL 0.51 0.66 0.60  Alkaline Phos 40 - 150 U/L 91 92 86  AST 5 - 34 U/L '14 19 15  '$ ALT 0 - 55 U/L '12 25 13   '$ Lactate dehydrogenase (LDH) - [facility]  Status: Finalresult Visible to patient:  Not Released Nextappt: Today at 08:45 AM in Oncology ([facility]) Dx:  Lymphoproliferative disease ([place])         Ref Range 2wk ago    LDH 125 - 245 U/L 158         Pathology report Diagnosis Lymph node, biopsy, 2.2 cm right axillary w/ second smaller node - ATYPICAL LYMPHOID PROLIFERATION, SEE COMMENT. Microscopic Comment Sections of lymph node show partial preservation of the lymph node architecture with open sinuses and scattered variably sized reactive appearing germinal centers. In some areas however, there appears to be expansion of the marginal zone area by variably sized but significant clusters of monocytoid appearing cells consisting of small  to medium sized lymphocytes with partially clumped chromatin, small nucleoli and moderately abundant amphophilic to clear cytoplasm. This is associated with deposition of hyalinized material in some areas. Some of the monocytoid areas do not necessarily appear to associate with reactive germinal centers. Numerous variably sized clusters of plasma cells area also seen in the background. Flow cytometric analysis was performed but failed to show any monoclonal B cell population or abnormal T cell phenotype ([id number]). Hence, immunohistochemical stains were performed including BCL-2, BCL-6, CD3, CD5, CD10, CD20, CD21, CD43, CD79a, CD138, kappa and lambda with appropriate controls. The expanded marginal zone areas show increased number of B cells as seen with CD20 and CD79a with no significant positivity for CD10, BCL-6, CD5, or CD43. CD10 and BCL-6 highlights scattered germinal centers in the background which are BCL-2 negative. CD21 highlights abundant dendritic networks which are somewhat expanded and irregular in many areas. CD138 highlights the abundant plasma cell component present throughout the lymph node. Kappa and lambda stains show an apparent polyclonal staining pattern in plasma cells.  The overall histologic and immunophenotypic features are worrisome for partial/early involvement by a B cell lymphoproliferative process particularly marginal zone lymphoma. However, the lack of definite monoclonality by flow cytometric studies, possibly related to sampling, hinders definitive diagnosis of malignancy. As a result, gene rearrangement and FISH studies will be performed on a representative block and the results reported in an addendum. (BNS:gt, 03/01/15)  Tissue-Flow Cytometry - NO MONOCLONAL B CELL POPULATION OR ABNORMAL T CELL PHENOTYPE IDENTIFIED.   ADDITIONAL INFORMATION: For completeness, in situ hybridization for kappa and lambda light chains were performed on a representative block at [facility] in [place], [place]. The plasma cells display a polyclonal pattern similar to that seen with conventional immunoperoxidase stains.  Susanne Greenhouse MD Pathologist, Electronic Signature ( Signed 03/19/2015)   RADIOGRAPHIC STUDIES: I have personally reviewed the radiological images as listed and agreed with the findings in the report. No results found.  ASSESSMENT & PLAN: 82 year old African-American female, presented with asymptomatic image discovered bilateral axillary adenopathy.  1. Atypical lymphoid proliferation, rule out lymphoma or CLL  -she was found to have bilateral axillary adenopathy, non-bulky, she is asymptomatic.  -I reviewed her lymph node biopsy results with her in details. The biopsy is suspicious for lymphoproliferative disease, especially low grade indolent  lymphoma, especially marginal zone lymphoma. However no evidence of B cell monoclonality by flow. The FISH studies were also negative for IGH/BCL2 fusion or rearrangements of BCL6, or IGH rearrangement. Certainly, this could be reactive change also, although she does not have any chronic inflammation on chest wall or breasts. -CLL is also a possibility, she has been having increased lymphocytosis (4.8-5K) -Her lab test CMP, LDH and CBC otherwise unremarkable -I discussed her CT scan findings, which showed bilateral axillary adenopathy, non-bulky, and the size appears to be similar to axillary ultrasound in June 2016, no other adenopathy on CT  -I did flow cytometry on peripheral blood which was negative for monoclonal B-cell population, no evidence of CLL -I recommend routine follow up and observation  - Labs reviewed, exam unremarkable with no palpable lymph nodes in her cervical, axillary or inguinal areas. Her wbc are normal. Lymphocytes slight elevated, 5.1K today, overall stable  -We'll continue follow-up once a year for the next to 2-3 years, if no change, we'll discharge patient.   2. HTN, DM -She  will continue follow-up with her primary care physician  Plan -Return to clinic with lab CBC, CMP and LDH in 1 year   No orders of the defined types were placed in this encounter.   All questions were answered. The patient knows to call the clinic with any problems, questions or concerns. I spent 15 minutes counseling the patient face to face. The total time spent in the appointment was 20 minutes and more than 50% was on counseling.  This document serves as a record of services personally performed by Truitt Merle, MD. It was created on her behalf by Brandt Loosen, a trained medical scribe. The creation of this record is based on the scribe's personal observations and the provider's statements to them. This document has been checked and approved by the attending provider.

## 2017-04-07 DIAGNOSIS — H409 Unspecified glaucoma: Secondary | ICD-10-CM | POA: Diagnosis not present

## 2017-04-07 DIAGNOSIS — E118 Type 2 diabetes mellitus with unspecified complications: Secondary | ICD-10-CM | POA: Diagnosis not present

## 2017-04-07 DIAGNOSIS — J449 Chronic obstructive pulmonary disease, unspecified: Secondary | ICD-10-CM | POA: Diagnosis not present

## 2017-04-07 DIAGNOSIS — I10 Essential (primary) hypertension: Secondary | ICD-10-CM | POA: Diagnosis not present

## 2017-04-07 DIAGNOSIS — E038 Other specified hypothyroidism: Secondary | ICD-10-CM | POA: Diagnosis not present

## 2017-05-04 DIAGNOSIS — I739 Peripheral vascular disease, unspecified: Secondary | ICD-10-CM | POA: Diagnosis not present

## 2017-05-04 DIAGNOSIS — E1051 Type 1 diabetes mellitus with diabetic peripheral angiopathy without gangrene: Secondary | ICD-10-CM | POA: Diagnosis not present

## 2017-05-04 DIAGNOSIS — L603 Nail dystrophy: Secondary | ICD-10-CM | POA: Diagnosis not present

## 2017-07-06 DIAGNOSIS — H40013 Open angle with borderline findings, low risk, bilateral: Secondary | ICD-10-CM | POA: Diagnosis not present

## 2017-08-06 DIAGNOSIS — I10 Essential (primary) hypertension: Secondary | ICD-10-CM | POA: Diagnosis not present

## 2017-08-06 DIAGNOSIS — E118 Type 2 diabetes mellitus with unspecified complications: Secondary | ICD-10-CM | POA: Diagnosis not present

## 2017-08-06 DIAGNOSIS — E782 Mixed hyperlipidemia: Secondary | ICD-10-CM | POA: Diagnosis not present

## 2017-08-10 DIAGNOSIS — E1042 Type 1 diabetes mellitus with diabetic polyneuropathy: Secondary | ICD-10-CM | POA: Diagnosis not present

## 2017-10-26 DIAGNOSIS — I739 Peripheral vascular disease, unspecified: Secondary | ICD-10-CM | POA: Diagnosis not present

## 2017-10-26 DIAGNOSIS — L603 Nail dystrophy: Secondary | ICD-10-CM | POA: Diagnosis not present

## 2017-10-26 DIAGNOSIS — E1051 Type 1 diabetes mellitus with diabetic peripheral angiopathy without gangrene: Secondary | ICD-10-CM | POA: Diagnosis not present

## 2017-12-06 DIAGNOSIS — E118 Type 2 diabetes mellitus with unspecified complications: Secondary | ICD-10-CM | POA: Diagnosis not present

## 2017-12-06 DIAGNOSIS — E119 Type 2 diabetes mellitus without complications: Secondary | ICD-10-CM | POA: Diagnosis not present

## 2017-12-06 DIAGNOSIS — E039 Hypothyroidism, unspecified: Secondary | ICD-10-CM | POA: Diagnosis not present

## 2017-12-06 DIAGNOSIS — E0800 Diabetes mellitus due to underlying condition with hyperosmolarity without nonketotic hyperglycemic-hyperosmolar coma (NKHHC): Secondary | ICD-10-CM | POA: Diagnosis not present

## 2017-12-06 DIAGNOSIS — E038 Other specified hypothyroidism: Secondary | ICD-10-CM | POA: Diagnosis not present

## 2017-12-07 DIAGNOSIS — I10 Essential (primary) hypertension: Secondary | ICD-10-CM | POA: Diagnosis not present

## 2017-12-07 DIAGNOSIS — E785 Hyperlipidemia, unspecified: Secondary | ICD-10-CM | POA: Diagnosis not present

## 2017-12-07 DIAGNOSIS — E119 Type 2 diabetes mellitus without complications: Secondary | ICD-10-CM | POA: Diagnosis not present

## 2018-01-25 DIAGNOSIS — L603 Nail dystrophy: Secondary | ICD-10-CM | POA: Diagnosis not present

## 2018-01-25 DIAGNOSIS — E1042 Type 1 diabetes mellitus with diabetic polyneuropathy: Secondary | ICD-10-CM | POA: Diagnosis not present

## 2018-01-25 DIAGNOSIS — I739 Peripheral vascular disease, unspecified: Secondary | ICD-10-CM | POA: Diagnosis not present

## 2018-02-03 DIAGNOSIS — H40013 Open angle with borderline findings, low risk, bilateral: Secondary | ICD-10-CM | POA: Diagnosis not present

## 2018-03-28 NOTE — Progress Notes (Signed)
[place]  [phone] [phone]) [phone]  Clinic follow Up Note   Patient Care Team: Lucianne Lei, MD as PCP - General (Family Medicine) Jackolyn Confer, MD as Consulting Physician (General Surgery) Truitt Merle, MD as Consulting Physician (Hematology) 03/31/2018   CHIEF COMPLAINTS:  Follow up lymphoproliferative disease  HISTORY OF PRESENTING ILLNESS 92/04/2016)  [name] 83 y.o. female is here because of lymphoproliferative disease.  She had routine mammogram on 11/16/2014, which showed a possible bilateral axillary adenopathy, no breast mass. Ultrasound reviewed multiple lymph nodes in bilateral axilla with cortical thickening, the largest measuring 2 cm in the right axillary, and a 1.3 cm node in the left axilla. She was asymptomatic, and axillary lymph nodes are not palpable on exam. She underwent right axilla lymph node core needle biopsy, which showed atypical lymphoid proliferation. She was referred to general surgeon Dr. Harrington Challenger in bowel, and underwent surgical biopsy of the 2 right axillary lymph nodes on 02/26/2015. She tolerated the biopsy very well, no residual pain or other complications.  She had a follow-up visit with Dr. Zella Richer in 08/2015, and she was referred to [place] for further evaluation.  She is doing very well, denies any significant pain, dyspnea, or GI symptoms. She denies fever, chills, skin rash or itchiness, night sweats, or weight loss. She lives independently by herself, still works as a Set designer, and drives. She has 2 daughters and 1 son, who lives in [place] too.   CURRENT THERAPY: observation  INTERIM HISTORY: Ms Ricciardi returns for follow up.  She is here alone. She is doing very well overall. She is taking Lantus 18 IU and her BG was 85 this morning. She walks every morning for 3 miles. She is not having any pain and has no major concerns.  She doesn't feel her LNs. No fever, night sweats, or weight changes.   Her last  mammogram was in 2017.    MEDICAL HISTORY:  Past Medical History:  Diagnosis Date  . Arthritis   . Asthma   . Diabetes mellitus without complication ([place])   . Hypertension     SURGICAL HISTORY: Past Surgical History:  Procedure Laterality Date  . ABDOMINAL HYSTERECTOMY  "many yrs ago"  . AXILLARY LYMPH NODE BIOPSY Right 02/26/2015   Procedure: RIGHT AXILLARY LYMPH NODE BIOPSY;  Surgeon: Jackolyn Confer, MD;  Location: [facility];  Service: General;  Laterality: Right;  . CHOLECYSTECTOMY      SOCIAL HISTORY: Social History   Socioeconomic History  . Marital status: Married    Spouse name: Not on file  . Number of children: Not on file  . Years of education: Not on file  . Highest education level: Not on file  Occupational History  . Not on file  Social Needs  . Financial resource strain: Not on file  . Food insecurity:    Worry: Not on file    Inability: Not on file  . Transportation needs:    Medical: Not on file    Non-medical: Not on file  Tobacco Use  . Smoking status: Current Every Day Smoker    Packs/day: 0.50    Years: 60.00    Pack years: 30.00  . Smokeless tobacco: Never Used  Substance and Sexual Activity  . Alcohol use: No  . Drug use: No  . Sexual activity: Not on file  Lifestyle  . Physical activity:    Days per week: Not on file    Minutes per session: Not on file  .  Stress: Not on file  Relationships  . Social connections:    Talks on phone: Not on file    Gets together: Not on file    Attends religious service: Not on file    Active member of club or organization: Not on file    Attends meetings of clubs or organizations: Not on file    Relationship status: Not on file  . Intimate partner violence:    Fear of current or ex partner: Not on file    Emotionally abused: Not on file    Physically abused: Not on file    Forced sexual activity: Not on file  Other Topics Concern  . Not on file  Social History Narrative  . Not on file     FAMILY HISTORY: Family History  Problem Relation Age of Onset  . Diabetes Mother   . Diabetes Father   . Diabetes Sister   . Diabetes Brother     ALLERGIES:  has No Known Allergies.  MEDICATIONS:  Current Outpatient Medications  Medication Sig Dispense Refill  . amLODipine-olmesartan (AZOR) 5-20 MG per tablet Take 1 tablet by mouth every morning.     Marland Kitchen aspirin 325 MG tablet Take 325 mg by mouth every morning.     . Cholecalciferol (VITAMIN D-3) 5000 UNITS TABS Take 1 capsule by mouth once a week. On fridays    . glipiZIDE (GLUCOTROL) 10 MG tablet Take 10 mg by mouth 2 (two) times daily.    . insulin glargine (LANTUS) 100 unit/mL SOPN Inject 14 Units into the skin at bedtime.     Marland Kitchen levothyroxine (SYNTHROID, LEVOTHROID) 50 MCG tablet Take 1 tablet by mouth daily.    . metFORMIN (GLUCOPHAGE) 500 MG tablet Take 500 mg by mouth 2 (two) times daily with a meal.    . RESTASIS MULTIDOSE 0.05 % ophthalmic emulsion Apply 1 drop to eye 2 (two) times daily. 1 drop in each eye  BID.    Marland Kitchen rosuvastatin (CRESTOR) 5 MG tablet Take 5 mg by mouth every morning.     . sitaGLIPtin (JANUVIA) 100 MG tablet Take 100 mg by mouth every morning.     . Tetrahydrozoline HCl (VISINE OP) Apply 2 drops to eye every morning.    Marland Kitchen Umeclidinium-Vilanterol (ANORO ELLIPTA) 62.5-25 MCG/INH AEPB Inhale 1 puff into the lungs daily as needed (for shortness of breath).     No current facility-administered medications for this visit.     REVIEW OF SYSTEMS:  Constitutional: Denies fevers, chills or abnormal night sweats Eyes: Denies blurriness of vision, double vision or watery eyes Ears, nose, mouth, throat, and face: Denies mucositis or sore throat Respiratory: Denies cough, dyspnea or wheezes Cardiovascular: Denies palpitation, chest discomfort or lower extremity swelling Gastrointestinal:  Denies nausea, heartburn or change in bowel habits Skin: Denies abnormal skin rashes Lymphatics: Denies new lymphadenopathy  or easy bruising Neurological:Denies numbness, tingling or new weaknesses Behavioral/Psych: Mood is stable, no new changes  All other systems were reviewed with the patient and are negative.  PHYSICAL EXAMINATION:  ECOG PERFORMANCE STATUS: 0 - Asymptomatic  Vitals:   03/31/18 0825  BP: 136/85  Pulse: 75  Resp: 18  Temp: 98.6 F (37 C)  SpO2: 97%   Filed Weights   03/31/18 0825  Weight: 141 lb 6.4 oz (64.1 kg)    GENERAL:alert, no distress and comfortable SKIN: skin color, texture, turgor are normal, no rashes or significant lesions EYES: normal, conjunctiva are pink and non-injected, sclera clear OROPHARYNX:no exudate, no erythema  and lips, buccal mucosa, and tongue normal  NECK: supple, thyroid normal size, non-tender, without nodularity LYMPH:  no palpable lymphadenopathy in the cervical, or inguinal area (+) one enlarged 1cm LN in her right axilla, and one 0.5cm LNs in her left axilla LUNGS: clear to auscultation and percussion with normal breathing effort HEART: regular rate & rhythm and no murmurs and no lower extremity edema ABDOMEN:abdomen soft, non-tender and normal bowel sounds Musculoskeletal:no cyanosis of digits and no clubbing  PSYCH: alert & oriented x 3 with fluent speech NEURO: no focal motor/sensory deficits  LABORATORY DATA:  I have reviewed the data as listed CBC Latest Ref Rng & Units 03/31/2018 04/01/2017 04/02/2016  WBC 3.9 - 10.3 K/uL 9.3 9.9 9.5  Hemoglobin 11.6 - 15.9 g/dL 13.6 13.3 13.1  Hematocrit 34.8 - 46.6 % 41.5 39.5 38.9  Platelets 145 - 400 K/uL 265 229 205    CMP Latest Ref Rng & Units 04/02/2016 10/17/2015 10/03/2015  Glucose 70 - 140 mg/dl 222(H) 170(H) 196(H)  BUN 7.0 - 26.0 mg/dL 10.3 7.1 8.5  Creatinine 0.6 - 1.1 mg/dL 1.2(H) 1.0 1.1  Sodium 136 - 145 mEq/L 141 142 142  Potassium 3.5 - 5.1 mEq/L 4.1 4.3 4.1  Chloride 101 - 111 mmol/L - - -  CO2 22 - 29 mEq/L '26 27 26  '$ Calcium 8.4 - 10.4 mg/dL 9.9 9.8 9.4  Total Protein 6.4 - 8.3 g/dL  7.6 7.9 7.8  Total Bilirubin 0.20 - 1.20 mg/dL 0.51 0.66 0.60  Alkaline Phos 40 - 150 U/L 91 92 86  AST 5 - 34 U/L '14 19 15  '$ ALT 0 - 55 U/L '12 25 13   '$ Lactate dehydrogenase (LDH) - [facility]  Status: Finalresult Visible to patient:  Not Released Nextappt: Today at 08:45 AM in Oncology ([facility]) Dx:  Lymphoproliferative disease ([place])         Ref Range 2wk ago    LDH 125 - 245 U/L 158         Pathology report Diagnosis Lymph node, biopsy, 2.2 cm right axillary w/ second smaller node - ATYPICAL LYMPHOID PROLIFERATION, SEE COMMENT. Microscopic Comment Sections of lymph node show partial preservation of the lymph node architecture with open sinuses and scattered variably sized reactive appearing germinal centers. In some areas however, there appears to be expansion of the marginal zone area by variably sized but significant clusters of monocytoid appearing cells consisting of small to medium sized lymphocytes with partially clumped chromatin, small nucleoli and moderately abundant amphophilic to clear cytoplasm. This is associated with deposition of hyalinized material in some areas. Some of the monocytoid areas do not necessarily appear to associate with reactive germinal centers. Numerous variably sized clusters of plasma cells area also seen in the background. Flow cytometric analysis was performed but failed to show any monoclonal B cell population or abnormal T cell phenotype ([id number]). Hence, immunohistochemical stains were performed including BCL-2, BCL-6, CD3, CD5, CD10, CD20, CD21, CD43, CD79a, CD138, kappa and lambda with appropriate controls. The expanded marginal zone areas show increased number of B cells as seen with CD20 and CD79a with no significant positivity for CD10, BCL-6, CD5, or CD43. CD10 and BCL-6 highlights scattered germinal centers in the background which are BCL-2 negative. CD21 highlights abundant dendritic networks which are somewhat  expanded and irregular in many areas. CD138 highlights the abundant plasma cell component present throughout the lymph node. Kappa and lambda stains show an apparent polyclonal staining pattern in plasma cells.  The overall histologic  and immunophenotypic features are worrisome for partial/early involvement by a B cell lymphoproliferative process particularly marginal zone lymphoma. However, the lack of definite monoclonality by flow cytometric studies, possibly related to sampling, hinders definitive diagnosis of malignancy. As a result, gene rearrangement and FISH studies will be performed on a representative block and the results reported in an addendum. (BNS:gt, 03/01/15)  Tissue-Flow Cytometry - NO MONOCLONAL B CELL POPULATION OR ABNORMAL T CELL PHENOTYPE IDENTIFIED.   ADDITIONAL INFORMATION: For completeness, in situ hybridization for kappa and lambda light chains were performed on a representative block at [facility] in [place], [place]. The plasma cells display a polyclonal pattern similar to that seen with conventional immunoperoxidase stains.  Susanne Greenhouse MD Pathologist, Electronic Signature ( Signed 03/19/2015)   RADIOGRAPHIC STUDIES: I have personally reviewed the radiological images as listed and agreed with the findings in the report. No results found.  ASSESSMENT & PLAN:  83 year old African-American female, presented with asymptomatic image discovered bilateral axillary adenopathy.  1.  Lymphoproliferative disease, rule out lymphoma or CLL  -she was found to have bilateral axillary adenopathy, non-bulky, she is asymptomatic.  -I previously reviewed her lymph node biopsy results with her in details. The biopsy is suspicious for lymphoproliferative disease, especially low grade indolent lymphoma, especially marginal zone lymphoma. However no evidence of B cell monoclonality by flow. The FISH studies were also negative for IGH/BCL2 fusion or rearrangements of BCL6,  or IGH rearrangement. Certainly, this could be reactive change also, although she does not have any chronic inflammation on chest wall or breasts. -CLL is also a possibility, she has been having increased lymphocytosis (4.8-5K), but her peripheral flow cytometry was negative for monoclonal B-cell.No evidence of CLL.  -Her lab test CMP, LDH and CBC otherwise unremarkable -I discussed her CT scan findings, which showed bilateral axillary adenopathy, non-bulky, and the size appears to be similar to axillary ultrasound in June 2016, no other adenopathy on CT  -I recommend routine follow up and observation  - Labs reviewed, exam unremarkable with no palpable lymph nodes in her cervical or inguinal areas. There was one 1 cm LN in her right axilla and one 0.5 LN in her left axilla. Her wbc are normal. Lymphocytes slight elevated, 5.1K today, overall stable  -We'll continue follow-up once a year -I will order a mammogram and [place] axilla today to be done this month    2. HTN, DM -She will continue follow-up with her primary care physician -She follows up with her PCP every 3 months  Plan -Return to clinic with lab CBC, CMP and LDH in 1 year -continue regular cancer screening -I will order bilateral diagnostic mammogram and [place] axilla today   Orders Placed This Encounter  Procedures  . MM DIAG BREAST TOMO BILATERAL    Standing Status:   Future    Standing Expiration Date:   04/01/2019    Order Specific Question:   Reason for Exam (SYMPTOM  OR DIAGNOSIS REQUIRED)    Answer:   screening, bilateral axillary adenopathy    Order Specific Question:   Preferred imaging location?    Answer:   [facility]  . MM DIAG BREAST TOMO UNI LEFT    Standing Status:   Future    Standing Expiration Date:   04/01/2019    Order Specific Question:   Reason for Exam (SYMPTOM  OR DIAGNOSIS REQUIRED)    Answer:   screening, bilateral axillary adenopathy    Order Specific Question:   Preferred imaging location?  Answer:   [facility]  . MM DIAG BREAST TOMO UNI RIGHT    Standing Status:   Future    Standing Expiration Date:   04/01/2019    Order Specific Question:   Reason for Exam (SYMPTOM  OR DIAGNOSIS REQUIRED)    Answer:   screening, bilateral axillary adenopathy    Order Specific Question:   Preferred imaging location?    Answer:   [facility]    All questions were answered. The patient knows to call the clinic with any problems, questions or concerns. I spent 15 minutes counseling the patient face to face. The total time spent in the appointment was 20 minutes and more than 50% was on counseling.  Dierdre Searles Dweik am acting as scribe for Dr. Truitt Merle.  I have reviewed the above documentation for accuracy and completeness, and I agree with the above.   03/31/2018

## 2018-03-31 ENCOUNTER — Inpatient Hospital Stay: Payer: Medicare Other | Attending: Hematology | Admitting: Hematology

## 2018-03-31 ENCOUNTER — Inpatient Hospital Stay: Payer: Medicare Other

## 2018-03-31 ENCOUNTER — Encounter: Payer: Self-pay | Admitting: Hematology

## 2018-03-31 VITALS — BP 136/85 | HR 75 | Temp 98.6°F | Resp 18 | Ht 63.0 in | Wt 141.4 lb

## 2018-03-31 DIAGNOSIS — E119 Type 2 diabetes mellitus without complications: Secondary | ICD-10-CM

## 2018-03-31 DIAGNOSIS — I10 Essential (primary) hypertension: Secondary | ICD-10-CM | POA: Diagnosis not present

## 2018-03-31 DIAGNOSIS — F17210 Nicotine dependence, cigarettes, uncomplicated: Secondary | ICD-10-CM | POA: Diagnosis not present

## 2018-03-31 DIAGNOSIS — D479 Neoplasm of uncertain behavior of lymphoid, hematopoietic and related tissue, unspecified: Secondary | ICD-10-CM | POA: Diagnosis not present

## 2018-03-31 DIAGNOSIS — Z79899 Other long term (current) drug therapy: Secondary | ICD-10-CM | POA: Diagnosis not present

## 2018-03-31 LAB — COMPREHENSIVE METABOLIC PANEL
ALT: 12 U/L (ref 0–44)
AST: 14 U/L — AB (ref 15–41)
Albumin: 3.7 g/dL (ref 3.5–5.0)
Alkaline Phosphatase: 89 U/L (ref 38–126)
Anion gap: 11 (ref 5–15)
BUN: 9 mg/dL (ref 8–23)
CO2: 27 mmol/L (ref 22–32)
Calcium: 9.8 mg/dL (ref 8.9–10.3)
Chloride: 105 mmol/L (ref 98–111)
Creatinine, Ser: 1.2 mg/dL — ABNORMAL HIGH (ref 0.44–1.00)
GFR calc Af Amer: 48 mL/min — ABNORMAL LOW (ref >60)
GFR calc non Af Amer: 42 mL/min — ABNORMAL LOW (ref >60)
Glucose, Bld: 102 mg/dL — ABNORMAL HIGH (ref 70–99)
POTASSIUM: 4.7 mmol/L (ref 3.5–5.1)
Sodium: 143 mmol/L (ref 135–145)
TOTAL PROTEIN: 7.9 g/dL (ref 6.5–8.1)
Total Bilirubin: 0.6 mg/dL (ref 0.3–1.2)

## 2018-03-31 LAB — CBC WITH DIFFERENTIAL/PLATELET
BASOS PCT: 0 %
Basophils Absolute: 0 10*3/uL (ref 0.0–0.1)
Eosinophils Absolute: 0 10*3/uL (ref 0.0–0.5)
Eosinophils Relative: 0 %
HEMATOCRIT: 41.5 % (ref 34.8–46.6)
HEMOGLOBIN: 13.6 g/dL (ref 11.6–15.9)
LYMPHS ABS: 5.1 10*3/uL — AB (ref 0.9–3.3)
LYMPHS PCT: 55 %
MCH: 28.8 pg (ref 25.1–34.0)
MCHC: 32.8 g/dL (ref 31.5–36.0)
MCV: 87.9 fL (ref 79.5–101.0)
MONOS PCT: 8 %
Monocytes Absolute: 0.7 10*3/uL (ref 0.1–0.9)
NEUTROS ABS: 3.4 10*3/uL (ref 1.5–6.5)
NEUTROS PCT: 37 %
Platelets: 265 10*3/uL (ref 145–400)
RBC: 4.72 MIL/uL (ref 3.70–5.45)
RDW: 15.3 % — ABNORMAL HIGH (ref 11.2–14.5)
WBC: 9.3 10*3/uL (ref 3.9–10.3)

## 2018-03-31 LAB — LACTATE DEHYDROGENASE: LDH: 151 U/L (ref 98–192)

## 2018-04-01 ENCOUNTER — Telehealth: Payer: Self-pay | Admitting: Hematology

## 2018-04-01 NOTE — Telephone Encounter (Signed)
Appointments scheduled Letter/Calendar mailed per 8/8 los

## 2018-04-05 DIAGNOSIS — I10 Essential (primary) hypertension: Secondary | ICD-10-CM | POA: Diagnosis not present

## 2018-04-05 DIAGNOSIS — E039 Hypothyroidism, unspecified: Secondary | ICD-10-CM | POA: Diagnosis not present

## 2018-04-05 DIAGNOSIS — J449 Chronic obstructive pulmonary disease, unspecified: Secondary | ICD-10-CM | POA: Diagnosis not present

## 2018-04-05 DIAGNOSIS — E119 Type 2 diabetes mellitus without complications: Secondary | ICD-10-CM | POA: Diagnosis not present

## 2018-04-05 DIAGNOSIS — E782 Mixed hyperlipidemia: Secondary | ICD-10-CM | POA: Diagnosis not present

## 2018-04-06 ENCOUNTER — Other Ambulatory Visit: Payer: Self-pay | Admitting: Hematology

## 2018-04-06 DIAGNOSIS — E782 Mixed hyperlipidemia: Secondary | ICD-10-CM | POA: Diagnosis not present

## 2018-04-06 DIAGNOSIS — Z1231 Encounter for screening mammogram for malignant neoplasm of breast: Secondary | ICD-10-CM

## 2018-04-06 DIAGNOSIS — Z6824 Body mass index (BMI) 24.0-24.9, adult: Secondary | ICD-10-CM | POA: Diagnosis not present

## 2018-04-06 DIAGNOSIS — E118 Type 2 diabetes mellitus with unspecified complications: Secondary | ICD-10-CM | POA: Diagnosis not present

## 2018-04-06 DIAGNOSIS — E039 Hypothyroidism, unspecified: Secondary | ICD-10-CM | POA: Diagnosis not present

## 2018-04-12 DIAGNOSIS — E1042 Type 1 diabetes mellitus with diabetic polyneuropathy: Secondary | ICD-10-CM | POA: Diagnosis not present

## 2018-05-09 ENCOUNTER — Ambulatory Visit
Admission: RE | Admit: 2018-05-09 | Discharge: 2018-05-09 | Disposition: A | Discharge status: Home / Self Care | Payer: Medicare Other | Source: Ambulatory Visit | Attending: Hematology | Admitting: Hematology

## 2018-05-09 DIAGNOSIS — Z1231 Encounter for screening mammogram for malignant neoplasm of breast: Secondary | ICD-10-CM | POA: Diagnosis not present

## 2018-06-14 ENCOUNTER — Inpatient Hospital Stay (HOSPITAL_COMMUNITY)
Admission: EM | Admit: 2018-06-14 | Discharge: 2018-06-16 | DRG: 247 | Disposition: A | Discharge status: Home / Self Care | Payer: Medicare Other | Attending: Cardiovascular Disease | Admitting: Cardiovascular Disease

## 2018-06-14 ENCOUNTER — Encounter (HOSPITAL_COMMUNITY): Payer: Self-pay | Admitting: Emergency Medicine

## 2018-06-14 ENCOUNTER — Other Ambulatory Visit: Payer: Self-pay

## 2018-06-14 ENCOUNTER — Inpatient Hospital Stay (HOSPITAL_COMMUNITY): Payer: Medicare Other

## 2018-06-14 DIAGNOSIS — I2584 Coronary atherosclerosis due to calcified coronary lesion: Secondary | ICD-10-CM | POA: Diagnosis not present

## 2018-06-14 DIAGNOSIS — Z90710 Acquired absence of both cervix and uterus: Secondary | ICD-10-CM

## 2018-06-14 DIAGNOSIS — Z794 Long term (current) use of insulin: Secondary | ICD-10-CM | POA: Diagnosis not present

## 2018-06-14 DIAGNOSIS — I2510 Atherosclerotic heart disease of native coronary artery without angina pectoris: Secondary | ICD-10-CM | POA: Diagnosis present

## 2018-06-14 DIAGNOSIS — I10 Essential (primary) hypertension: Secondary | ICD-10-CM | POA: Diagnosis present

## 2018-06-14 DIAGNOSIS — Z79899 Other long term (current) drug therapy: Secondary | ICD-10-CM | POA: Diagnosis not present

## 2018-06-14 DIAGNOSIS — Z833 Family history of diabetes mellitus: Secondary | ICD-10-CM

## 2018-06-14 DIAGNOSIS — M1990 Unspecified osteoarthritis, unspecified site: Secondary | ICD-10-CM | POA: Diagnosis present

## 2018-06-14 DIAGNOSIS — E118 Type 2 diabetes mellitus with unspecified complications: Secondary | ICD-10-CM

## 2018-06-14 DIAGNOSIS — I252 Old myocardial infarction: Secondary | ICD-10-CM | POA: Diagnosis present

## 2018-06-14 DIAGNOSIS — F17200 Nicotine dependence, unspecified, uncomplicated: Secondary | ICD-10-CM | POA: Diagnosis present

## 2018-06-14 DIAGNOSIS — M4124 Other idiopathic scoliosis, thoracic region: Secondary | ICD-10-CM | POA: Diagnosis not present

## 2018-06-14 DIAGNOSIS — Z8249 Family history of ischemic heart disease and other diseases of the circulatory system: Secondary | ICD-10-CM

## 2018-06-14 DIAGNOSIS — Z7982 Long term (current) use of aspirin: Secondary | ICD-10-CM

## 2018-06-14 DIAGNOSIS — D479 Neoplasm of uncertain behavior of lymphoid, hematopoietic and related tissue, unspecified: Secondary | ICD-10-CM | POA: Diagnosis not present

## 2018-06-14 DIAGNOSIS — I214 Non-ST elevation (NSTEMI) myocardial infarction: Secondary | ICD-10-CM | POA: Diagnosis not present

## 2018-06-14 DIAGNOSIS — Z955 Presence of coronary angioplasty implant and graft: Secondary | ICD-10-CM

## 2018-06-14 DIAGNOSIS — J45909 Unspecified asthma, uncomplicated: Secondary | ICD-10-CM | POA: Diagnosis not present

## 2018-06-14 DIAGNOSIS — R079 Chest pain, unspecified: Secondary | ICD-10-CM | POA: Diagnosis not present

## 2018-06-14 DIAGNOSIS — E785 Hyperlipidemia, unspecified: Secondary | ICD-10-CM | POA: Diagnosis present

## 2018-06-14 LAB — BASIC METABOLIC PANEL
Anion gap: 13 (ref 5–15)
BUN: 11 mg/dL (ref 8–23)
CO2: 22 mmol/L (ref 22–32)
Calcium: 9.6 mg/dL (ref 8.9–10.3)
Chloride: 103 mmol/L (ref 98–111)
Creatinine, Ser: 1.08 mg/dL — ABNORMAL HIGH (ref 0.44–1.00)
GFR calc Af Amer: 54 mL/min — ABNORMAL LOW (ref >60)
GFR calc non Af Amer: 47 mL/min — ABNORMAL LOW (ref >60)
Glucose, Bld: 153 mg/dL — ABNORMAL HIGH (ref 70–99)
Potassium: 3.8 mmol/L (ref 3.5–5.1)
Sodium: 138 mmol/L (ref 135–145)

## 2018-06-14 LAB — CBC
HCT: 42.1 % (ref 36.0–46.0)
Hemoglobin: 13.4 g/dL (ref 12.0–15.0)
MCH: 27.9 pg (ref 26.0–34.0)
MCHC: 31.8 g/dL (ref 30.0–36.0)
MCV: 87.7 fL (ref 80.0–100.0)
Platelets: 264 10*3/uL (ref 150–400)
RBC: 4.8 MIL/uL (ref 3.87–5.11)
RDW: 14.7 % (ref 11.5–15.5)
WBC: 10.7 10*3/uL — ABNORMAL HIGH (ref 4.0–10.5)
nRBC: 0 % (ref 0.0–0.2)

## 2018-06-14 LAB — PROTIME-INR
INR: 1.01
PROTHROMBIN TIME: 13.2 s (ref 11.4–15.2)

## 2018-06-14 LAB — HEPARIN LEVEL (UNFRACTIONATED): HEPARIN UNFRACTIONATED: 0.42 [IU]/mL (ref 0.30–0.70)

## 2018-06-14 LAB — GLUCOSE, CAPILLARY
GLUCOSE-CAPILLARY: 78 mg/dL (ref 70–99)
GLUCOSE-CAPILLARY: 163 mg/dL — AB (ref 70–99)

## 2018-06-14 LAB — I-STAT TROPONIN, ED: Troponin i, poc: 1.58 ng/mL (ref 0.00–0.08)

## 2018-06-14 LAB — CBG MONITORING, ED: GLUCOSE-CAPILLARY: 83 mg/dL (ref 70–99)

## 2018-06-14 LAB — TROPONIN I: Troponin I: 1.09 ng/mL (ref <0.03)

## 2018-06-14 MED ORDER — NITROGLYCERIN 0.4 MG SL SUBL: 0.4000 mg | SUBLINGUAL_TABLET | SUBLINGUAL | Status: DC | PRN

## 2018-06-14 MED ORDER — ROSUVASTATIN CALCIUM 20 MG PO TABS
40.0000 mg | ORAL_TABLET | Freq: Every morning | ORAL | Status: DC
  Administered 2018-06-15 – 2018-06-16 (×2): 40 mg via ORAL
  Filled 2018-06-14 (×2): qty 2

## 2018-06-14 MED ORDER — ATORVASTATIN CALCIUM 80 MG PO TABS: 80.0000 mg | ORAL_TABLET | Freq: Every day | ORAL | Status: DC

## 2018-06-14 MED ORDER — SODIUM CHLORIDE 0.9 % WEIGHT BASED INFUSION
3.0000 mL/kg/h | INTRAVENOUS | Status: DC
  Administered 2018-06-15: 3 mL/kg/h via INTRAVENOUS

## 2018-06-14 MED ORDER — SODIUM CHLORIDE 0.9 % IV SOLN
INTRAVENOUS | Status: DC
  Administered 2018-06-14: 15:00:00 via INTRAVENOUS

## 2018-06-14 MED ORDER — GLIPIZIDE 5 MG PO TABS
10.0000 mg | ORAL_TABLET | Freq: Two times a day (BID) | ORAL | Status: DC
  Administered 2018-06-15 – 2018-06-16 (×3): 10 mg via ORAL
  Filled 2018-06-14: qty 1
  Filled 2018-06-14 (×2): qty 2

## 2018-06-14 MED ORDER — UMECLIDINIUM-VILANTEROL 62.5-25 MCG/INH IN AEPB
1.0000 | INHALATION_SPRAY | Freq: Every day | RESPIRATORY_TRACT | Status: DC
  Administered 2018-06-15 – 2018-06-16 (×2): 1 via RESPIRATORY_TRACT
  Filled 2018-06-14: qty 14

## 2018-06-14 MED ORDER — AMLODIPINE-OLMESARTAN 5-20 MG PO TABS: 1.0000 | ORAL_TABLET | Freq: Every morning | ORAL | Status: DC

## 2018-06-14 MED ORDER — METOPROLOL TARTRATE 5 MG/5ML IV SOLN
5.0000 mg | Freq: Once | INTRAVENOUS | Status: AC
  Administered 2018-06-14: 5 mg via INTRAVENOUS
  Filled 2018-06-14: qty 5

## 2018-06-14 MED ORDER — AMLODIPINE BESYLATE 5 MG PO TABS
5.0000 mg | ORAL_TABLET | Freq: Every day | ORAL | Status: DC
  Administered 2018-06-15: 5 mg via ORAL
  Filled 2018-06-14: qty 1

## 2018-06-14 MED ORDER — HEPARIN BOLUS VIA INFUSION
3000.0000 [IU] | Freq: Once | INTRAVENOUS | Status: AC
  Administered 2018-06-14: 3000 [IU] via INTRAVENOUS
  Filled 2018-06-14: qty 3000

## 2018-06-14 MED ORDER — SODIUM CHLORIDE 0.9 % IV SOLN: 250.0000 mL | INTRAVENOUS | Status: DC | PRN

## 2018-06-14 MED ORDER — LINAGLIPTIN 5 MG PO TABS
5.0000 mg | ORAL_TABLET | Freq: Every day | ORAL | Status: DC
  Administered 2018-06-15 – 2018-06-16 (×2): 5 mg via ORAL
  Filled 2018-06-14 (×2): qty 1

## 2018-06-14 MED ORDER — SODIUM CHLORIDE 0.9% FLUSH: 3.0000 mL | INTRAVENOUS | Status: DC | PRN

## 2018-06-14 MED ORDER — INSULIN ASPART 100 UNIT/ML ~~LOC~~ SOLN: 0.0000 [IU] | Freq: Three times a day (TID) | SUBCUTANEOUS | Status: DC

## 2018-06-14 MED ORDER — INSULIN GLARGINE 100 UNIT/ML ~~LOC~~ SOLN
18.0000 [IU] | Freq: Every day | SUBCUTANEOUS | Status: DC
  Administered 2018-06-14 – 2018-06-15 (×2): 18 [IU] via SUBCUTANEOUS
  Filled 2018-06-14 (×3): qty 0.18

## 2018-06-14 MED ORDER — SODIUM CHLORIDE 0.9% FLUSH
3.0000 mL | Freq: Two times a day (BID) | INTRAVENOUS | Status: DC
  Administered 2018-06-14 – 2018-06-15 (×2): 3 mL via INTRAVENOUS

## 2018-06-14 MED ORDER — LEVOTHYROXINE SODIUM 50 MCG PO TABS
50.0000 ug | ORAL_TABLET | Freq: Every day | ORAL | Status: DC
  Administered 2018-06-15 – 2018-06-16 (×2): 50 ug via ORAL
  Filled 2018-06-14 (×2): qty 1

## 2018-06-14 MED ORDER — ONDANSETRON HCL 4 MG/2ML IJ SOLN: 4.0000 mg | Freq: Three times a day (TID) | INTRAMUSCULAR | Status: AC | PRN

## 2018-06-14 MED ORDER — ASPIRIN EC 81 MG PO TBEC
81.0000 mg | DELAYED_RELEASE_TABLET | Freq: Every day | ORAL | Status: DC
  Administered 2018-06-15 – 2018-06-16 (×2): 81 mg via ORAL
  Filled 2018-06-14 (×4): qty 1

## 2018-06-14 MED ORDER — ASPIRIN 81 MG PO CHEW
243.0000 mg | CHEWABLE_TABLET | Freq: Once | ORAL | Status: AC
  Administered 2018-06-14: 243 mg via ORAL
  Filled 2018-06-14: qty 3

## 2018-06-14 MED ORDER — METOPROLOL TARTRATE 12.5 MG HALF TABLET
12.5000 mg | ORAL_TABLET | Freq: Two times a day (BID) | ORAL | Status: DC
  Administered 2018-06-14 – 2018-06-16 (×4): 12.5 mg via ORAL
  Filled 2018-06-14 (×4): qty 1

## 2018-06-14 MED ORDER — IRBESARTAN 150 MG PO TABS
150.0000 mg | ORAL_TABLET | Freq: Every day | ORAL | Status: DC
  Filled 2018-06-14: qty 1

## 2018-06-14 MED ORDER — CYCLOSPORINE 0.05 % OP EMUL
1.0000 [drp] | Freq: Two times a day (BID) | OPHTHALMIC | Status: DC
  Administered 2018-06-15 – 2018-06-16 (×3): 1 [drp] via OPHTHALMIC
  Filled 2018-06-14 (×4): qty 1

## 2018-06-14 MED ORDER — SODIUM CHLORIDE 0.9 % WEIGHT BASED INFUSION
1.0000 mL/kg/h | INTRAVENOUS | Status: DC
  Administered 2018-06-15: 1 mL/kg/h via INTRAVENOUS

## 2018-06-14 MED ORDER — ACETAMINOPHEN 325 MG PO TABS
650.0000 mg | ORAL_TABLET | ORAL | Status: DC | PRN
  Administered 2018-06-15: 18:00:00 650 mg via ORAL
  Filled 2018-06-14: qty 2

## 2018-06-14 MED ORDER — ASPIRIN 81 MG PO CHEW: 81.0000 mg | CHEWABLE_TABLET | Freq: Once | ORAL | Status: DC

## 2018-06-14 MED ORDER — HEPARIN (PORCINE) IN NACL 100-0.45 UNIT/ML-% IJ SOLN
900.0000 [IU]/h | INTRAMUSCULAR | Status: DC
  Administered 2018-06-14: 800 [IU]/h via INTRAVENOUS
  Administered 2018-06-15: 900 [IU]/h via INTRAVENOUS
  Filled 2018-06-14 (×2): qty 250

## 2018-06-14 NOTE — Progress Notes (Signed)
ANTICOAGULATION CONSULT NOTE - Follow Up Consult  Pharmacy Consult for Heparin Indication: chest pain/ACS/ NSTEMI  No Known Allergies  Patient Measurements: Height: 5\' 3"  (160 cm) Weight: 146 lb (66.2 kg) IBW/kg (Calculated) : 52.4 Heparin Dosing Weight: 66 kg  Vital Signs: Temp: 97.7 F (36.5 C) (10/22 2119) Temp Source: Oral (10/22 2119) BP: 158/76 (10/22 2119) Pulse Rate: 72 (10/22 2119)  Labs: Recent Labs    06/14/18 1142 06/14/18 1928  HGB 13.4  --   HCT 42.1  --   PLT 264  --   LABPROT  --  13.2  INR  --  1.01  HEPARINUNFRC  --  0.42  CREATININE 1.08*  --   TROPONINI  --  1.09*    Estimated Creatinine Clearance: 37.3 mL/min (A) (by C-G formula based on SCr of 1.08 mg/dL (H)).  Assessment:  83 yr old female seen at [facility] ED for chest pain/ACS/NSTEMI.  IV heparin begun prior to transfer to [facility].     Initial heparin level is therapeutic (0.42) on 800 units/hr.  Goal of Therapy:  Heparin level 0.3-0.7 units/ml Monitor platelets by anticoagulation protocol: Yes   Plan:   Continue heparin drip at 800 units/hr  Daily heparin level and CBC.  Planning cardiac cath on 10/23.  Arty Baumgartner, [place] Pager: [phone] 06/14/2018,9:44 PM

## 2018-06-14 NOTE — H&P (Addendum)
Cardiology Admission History and Physical:   [name] ID: [name] MRN: [id number]; DOB: 1935-02-01   Admission date: 06/14/2018  Primary Care Provider: Lucianne Lei, MD Primary Cardiologist: New Primary Electrophysiologist:  None   Chief Complaint:  Chest pain  [name] Profile:   [name] is a 83 y.o. female with past medical history of hypertension, DM 2, and history of lymphoproliferative disease followed by Dr. Burr Medico  History of Present Illness:   [name] is a pleasant 83 year old African-American female with past medical history of hypertension, DM 2, and history of lymphoproliferative disease followed by Dr. Burr Medico.  She had a possible bilateral axillary adenopathy with no breast mass on previous mammogram in 2016.  She had a biopsy which showed atypical lymphoid proliferation.  This has been followed on a annual basis by oncology service.  Her last follow-up with oncology was in August 2019, she was doing good at Madeline time.  It was recommended to continue observation and a routine annual follow-up.  Otherwise [name] denies any history of cardiac issue.  Both her mother and her sister died of heart disease.  Her sister passed away from heart disease before age 34.  She herself has never been diagnosed with cardiac issues in Madeline past.  She was in her usual state of health until yesterday when she had a sudden onset of substernal sharp chest pain radiating down to Madeline right arm.  This occurred while she was climbing up a hill.  She has been climbing Madeline same heel for Madeline past several months and has never had any exertional chest pain like this.  Madeline sharp chest pain did not go away.  It waxed and waned from yesterday morning all Madeline way until this morning.  She did not tell her family member until this morning.  She also admits to have some dyspnea on exertion since yesterday as well.  Due to persistent chest discomfort, she was brought to Madeline emergency room for further evaluation.  EKG  showed T wave inversion in inferolateral leads.  First point-of-care troponin was 1.58.  After she arrived in Madeline ED, she was given a high-dose aspirin, metoprolol and started on IV heparin.  Her chest pain resolved in Madeline emergency room this morning.  She has been chest pain-free for Madeline past 4 hours.  She is currently waiting for transfer to [facility].   Past Medical History:  Diagnosis Date  . Arthritis   . Asthma   . Diabetes mellitus without complication ([place])   . Hypertension     Past Surgical History:  Procedure Laterality Date  . ABDOMINAL HYSTERECTOMY  "many yrs ago"  . AXILLARY LYMPH NODE BIOPSY Right 02/26/2015   Procedure: RIGHT AXILLARY LYMPH NODE BIOPSY;  Surgeon: Jackolyn Confer, MD;  Location: [facility]S;  Service: General;  Laterality: Right;  . CHOLECYSTECTOMY       Medications Prior to Admission: Prior to Admission medications   Medication Sig Start Date End Date Taking? Authorizing Provider  amLODipine-olmesartan (AZOR) 5-20 MG per tablet Take 1 tablet by mouth every morning.    Yes [provider]  aspirin EC 81 MG tablet Take 81 mg by mouth daily.   Yes [provider]  Cholecalciferol (VITAMIN D3) 50000 units CAPS Take 50,000 Units by mouth once a week. On Friday 04/09/18  Yes [provider]  glipiZIDE (GLUCOTROL) 10 MG tablet Take 10 mg by mouth 2 (two) times daily.   Yes [provider]  insulin glargine (  LANTUS) 100 unit/mL SOPN Inject 18 Units into Madeline skin at bedtime.    Yes [provider]  levothyroxine (SYNTHROID, LEVOTHROID) 50 MCG tablet Take 1 tablet by mouth daily. 01/05/17  Yes [provider]  metFORMIN (GLUCOPHAGE) 500 MG tablet Take 1,000 mg by mouth at bedtime.    Yes [provider]  RESTASIS MULTIDOSE 0.05 % ophthalmic emulsion Apply 1 drop to eye 2 (two) times daily.  03/01/17  Yes [provider]  rosuvastatin (CRESTOR) 5 MG tablet Take 5 mg by mouth every morning.    Yes  [provider]  sitaGLIPtin (JANUVIA) 100 MG tablet Take 100 mg by mouth every morning.    Yes [provider]  Umeclidinium-Vilanterol (ANORO ELLIPTA) 62.5-25 MCG/INH AEPB Inhale 1 puff into Madeline lungs daily.    Yes [provider]     Allergies:   No Known Allergies  Social History:   Social History   Socioeconomic History  . Marital status: Married    Spouse name: Not on file  . Number of children: Not on file  . Years of education: Not on file  . Highest education level: Not on file  Occupational History  . Not on file  Social Needs  . Financial resource strain: Not on file  . Food insecurity:    Worry: Not on file    Inability: Not on file  . Transportation needs:    Medical: Not on file    Non-medical: Not on file  Tobacco Use  . Smoking status: Current Every Day Smoker    Packs/day: 0.50    Years: 60.00    Pack years: 30.00  . Smokeless tobacco: Never Used  Substance and Sexual Activity  . Alcohol use: No  . Drug use: No  . Sexual activity: Not on file  Lifestyle  . Physical activity:    Days per week: Not on file    Minutes per session: Not on file  . Stress: Not on file  Relationships  . Social connections:    Talks on phone: Not on file    Gets together: Not on file    Attends religious service: Not on file    Active member of club or organization: Not on file    Attends meetings of clubs or organizations: Not on file    Relationship status: Not on file  . Intimate partner violence:    Fear of current or ex partner: Not on file    Emotionally abused: Not on file    Physically abused: Not on file    Forced sexual activity: Not on file  Other Topics Concern  . Not on file  Social History Narrative  . Not on file    Family History:   [name]'s family history includes Diabetes in her brother, father, mother, and sister.    ROS:  Please see Madeline history of present illness.  All other ROS reviewed and negative.      Physical Exam/Data:   Vitals:   06/14/18 1308 06/14/18 1400 06/14/18 1430 06/14/18 1545  BP: (!) 153/75 (!) 159/79 (!) 146/76 137/65  Pulse: (!) 59 62 62 61  Resp: 15 18 (!) 23 16  Temp:    98.1 F (36.7 C)  TempSrc:    Oral  SpO2: 98% 95% 98% 98%  Weight:      Height:       No intake or output data in Madeline 24 hours ending 06/14/18 1629 Filed Weights   06/14/18 1119  Weight: 66.2 kg   Body mass index is 25.86 kg/m.  General:  Well nourished, well developed, in no acute distress HEENT: normal Lymph: no adenopathy Neck: no JVD Endocrine:  No thryomegaly Vascular: No carotid bruits; FA pulses 2+ bilaterally without bruits  Cardiac:  normal S1, S2; RRR; no murmur  Lungs:  clear to auscultation bilaterally, no wheezing, rhonchi or rales  Abd: soft, nontender, no hepatomegaly  Ext: no edema Musculoskeletal:  No deformities, BUE and BLE strength normal and equal Skin: warm and dry  Neuro:  CNs 2-12 intact, no focal abnormalities noted Psych:  Normal affect    EKG:  Madeline ECG that was done in Madeline ED and was personally reviewed and demonstrates normal sinus rhythm with T wave inversion in inferolateral leads.  Relevant CV Studies: N/A  Laboratory Data:  Chemistry Recent Labs  Lab 06/14/18 1142  NA 138  K 3.8  CL 103  CO2 22  GLUCOSE 153*  BUN 11  CREATININE 1.08*  CALCIUM 9.6  GFRNONAA 47*  GFRAA 54*  ANIONGAP 13    No results for input(s): PROT, ALBUMIN, AST, ALT, ALKPHOS, BILITOT in Madeline last 168 hours. Hematology Recent Labs  Lab 06/14/18 1142  WBC 10.7*  RBC 4.80  HGB 13.4  HCT 42.1  MCV 87.7  MCH 27.9  MCHC 31.8  RDW 14.7  PLT 264   Cardiac EnzymesNo results for input(s): TROPONINI in Madeline last 168 hours.  Recent Labs  Lab 06/14/18 1146  TROPIPOC 1.58*    BNPNo results for input(s): BNP, PROBNP in Madeline last 168 hours.  DDimer No results for input(s): DDIMER in Madeline last 168 hours.  Radiology/Studies:  Dg Chest 2 View  Result Date:  06/14/2018 CLINICAL DATA:  Right upper chest pain. EXAM: CHEST - 2 VIEW COMPARISON:  CT 10/14/2015. FINDINGS: Stable mediastinal fullness consistent prominent great vessels and tortuosity of Madeline thoracic aorta again noted. No interim change. Heart size stable. Lungs are clear. No pleural effusion or pneumothorax. No acute bony abnormality. Thoracic spine scoliosis. Surgical clips right axilla. IMPRESSION: No acute cardiopulmonary disease. Electronically Signed   By: Marcello Moores  Register   On: 06/14/2018 13:10    Assessment and Plan:   1. NSTEMI: Continue IV heparin, [name] is chest pain-free at this time.  Chest pain persisted for longer than 12 hours since yesterday morning until this morning.  EKG demonstrated inferolateral T wave inversion.  Plan to transfer to [facility], continue IV heparin, aspirin, high-dose Lipitor and low-dose beta-blocker overnight.  N.p.o. past midnight.  Cardiac catheterization tomorrow.  - serial enzyme. Echocardiogram  - FLP and hemoglobin A1C  - Risk and benefit of procedure explained to [name] who display clear understanding and agree to proceed.  Discussed with [name] possible procedural risk include bleeding, vascular injury, renal injury, arrythmia, MI, stroke and loss of limb or life.  2. Hypertension: Blood pressure elevated, add low-dose metoprolol tartrate 12.5 mg twice daily  3. DM2: Check hemoglobin A1c.  4. Lymphoproliferative disorder: Followed by oncology, stable.  Currently followed up on annual basis.   Severity of Illness: Madeline appropriate [name] status for this [name] is INPATIENT. Inpatient status is judged to be reasonable and necessary in order to provide Madeline required intensity of service to ensure [name]'s safety. [name]'s presenting symptoms, physical exam findings, and initial radiographic and laboratory data in Madeline context of their chronic comorbidities is felt to place them at high risk for further clinical  deterioration. Furthermore, it is not anticipated  that [name] will be medically stable for discharge from Madeline hospital within 2 midnights of admission. Madeline following factors support [name] status of inpatient.   " [name]'s presenting symptoms include Chest pain. " Madeline worrisome physical exam findings include benign. " Madeline initial radiographic and laboratory data are worrisome because of positive troponin, abnormal EKG. " Madeline chronic co-morbidities include HTN and DM II.   * I certify that at Madeline point of admission it is my clinical judgment that [name] will require inpatient hospital care spanning beyond 2 midnights from Madeline point of admission due to high intensity of service, high risk for further deterioration and high frequency of surveillance required.*    For questions or updates, please contact [place] Please consult [email or web address] for contact info under   Hilbert Corrigan, [place]  06/14/2018 4:29 PM   [name] seen, examined. Available data reviewed. Agree with findings, assessment, and plan as outlined by Almyra Deforest, PA. On exam: Vitals:   06/14/18 1615 06/14/18 1645  BP:    Pulse: 64 65  Resp: (!) 22 20  Temp:    SpO2: 97% 100%   Pt is alert and oriented, NAD HEENT: normal Neck: JVP - normal, carotids 2+= without bruits Lungs: CTA bilaterally CV: RRR with 2/6 SEM at Madeline RUSB Abd: soft, NT, Positive BS, no hepatomegaly Ext: no C/C/E, distal pulses intact and equal Skin: warm/dry no rash  [name] had prolonged chest pain yesterday, finally resolving this morning with no recurrence since on IV heparin. Troponin POC is significantly elevated and her clinical presentation is c/w NSTEMI. Will continue IV heparin, ASA, a high-intensity statin drug, and start her on a beta-blocker. Plan for cardiac catheterization and possible PCI tomorrow unless she develops recurrent ischemic symptoms at rest. I have reviewed Madeline risks, indications, and alternatives to  cardiac catheterization, possible angioplasty, and stenting with [name]. Risks include but are not limited to bleeding, infection, vascular injury, stroke, myocardial infection, arrhythmia, kidney injury, radiation-related injury in Madeline case of prolonged fluoroscopy use, emergency cardiac surgery, and death. [name] understands Madeline risks of serious complication is 1-2 in 9292 with diagnostic cardiac cath and 1-2% or less with angioplasty/stenting.   Sherren Mocha, M.D. 06/14/2018 6:20 PM

## 2018-06-14 NOTE — ED Provider Notes (Signed)
[facility] DEPT Provider Note   CSN: [id number] Arrival date & time: 06/14/18  1114     History   Chief Complaint Chief Complaint  Patient presents with  . Chest Pain  . Shortness of Breath  . Arm Pain    HPI [name] is a 83 y.o. female.  HPI   83 year old female with chest pain.  Symptoms began yesterday morning while on one of her regular walks.  She reports that she was walking up a large hill at [facility] for the second time that morning when she began having sharp pain in the center of her chest and dull ache in her right arm.  Associated with dyspnea and nausea.  She states that she felt like if she could just vomit she would feel better.  Symptoms persisted throughout the day although improved from initial onset.  She took 81 mg aspirin and rested for the remained of the day.  She was still having symptoms today so told family and came to the ED. She did take another baby aspirin this morning.  She denies any known cardiac history.  Past Medical History:  Diagnosis Date  . Arthritis   . Asthma   . Diabetes mellitus without complication ([place])   . Hypertension     Patient Active Problem List   Diagnosis Date Noted  . Lymphoproliferative disease ([place]) 10/03/2015    Past Surgical History:  Procedure Laterality Date  . ABDOMINAL HYSTERECTOMY  "many yrs ago"  . AXILLARY LYMPH NODE BIOPSY Right 02/26/2015   Procedure: RIGHT AXILLARY LYMPH NODE BIOPSY;  Surgeon: Jackolyn Confer, MD;  Location: [facility];  Service: General;  Laterality: Right;  . CHOLECYSTECTOMY       OB History   None      Home Medications    Prior to Admission medications   Medication Sig Start Date End Date Taking? Authorizing Provider  amLODipine-olmesartan (AZOR) 5-20 MG per tablet Take 1 tablet by mouth every morning.     [provider]  aspirin 325 MG tablet Take 325 mg by mouth every morning.     [provider]  Cholecalciferol  (VITAMIN D-3) 5000 UNITS TABS Take 1 capsule by mouth once a week. On fridays    [provider]  glipiZIDE (GLUCOTROL) 10 MG tablet Take 10 mg by mouth 2 (two) times daily.    [provider]  insulin glargine (LANTUS) 100 unit/mL SOPN Inject 14 Units into the skin at bedtime.     [provider]  levothyroxine (SYNTHROID, LEVOTHROID) 50 MCG tablet Take 1 tablet by mouth daily. 01/05/17   [provider]  metFORMIN (GLUCOPHAGE) 500 MG tablet Take 500 mg by mouth 2 (two) times daily with a meal.    [provider]  RESTASIS MULTIDOSE 0.05 % ophthalmic emulsion Apply 1 drop to eye 2 (two) times daily. 1 drop in each eye  BID. 03/01/17   [provider]  rosuvastatin (CRESTOR) 5 MG tablet Take 5 mg by mouth every morning.     [provider]  sitaGLIPtin (JANUVIA) 100 MG tablet Take 100 mg by mouth every morning.     [provider]  Tetrahydrozoline HCl (VISINE OP) Apply 2 drops to eye every morning.    [provider]  Umeclidinium-Vilanterol (ANORO ELLIPTA) 62.5-25 MCG/INH AEPB Inhale 1 puff into the lungs daily as needed (for shortness of breath).    [provider]    Family History Family History  Problem  Relation Age of Onset  . Diabetes Mother   . Diabetes Father   . Diabetes Sister   . Diabetes Brother     Social History Social History   Tobacco Use  . Smoking status: Current Every Day Smoker    Packs/day: 0.50    Years: 60.00    Pack years: 30.00  . Smokeless tobacco: Never Used  Substance Use Topics  . Alcohol use: No  . Drug use: No     Allergies   Patient has no known allergies.   Review of Systems Review of Systems  All systems reviewed and negative, other than as noted in HPI.  Physical Exam Updated Vital Signs BP (!) 156/75 (BP Location: Left Arm)   Pulse 75   Temp 98.6 F (37 C)   Resp 15   Ht 5\' 3"  (1.6 m)   Wt 66.2 kg   SpO2 95%   BMI 25.86 kg/m   Physical  Exam  Constitutional: She appears well-developed and well-nourished. No distress.  HENT:  Head: Normocephalic and atraumatic.  Eyes: Conjunctivae are normal. Right eye exhibits no discharge. Left eye exhibits no discharge.  Neck: Neck supple.  Cardiovascular: Normal rate, regular rhythm and normal heart sounds. Exam reveals no gallop and no friction rub.  No murmur heard. Pulmonary/Chest: Effort normal and breath sounds normal. No respiratory distress.  Abdominal: Soft. She exhibits no distension. There is no tenderness.  Musculoskeletal: She exhibits no edema or tenderness.       Left lower leg: She exhibits no edema.  Neurological: She is alert.  Skin: Skin is warm and dry.  Psychiatric: She has a normal mood and affect. Her behavior is normal. Thought content normal.  Nursing note and vitals reviewed.    ED Treatments / Results  Labs (all labs ordered are listed, but only abnormal results are displayed) Labs Reviewed  BASIC METABOLIC PANEL - Abnormal; Notable for the following components:      Result Value   Glucose, Bld 153 (*)    Creatinine, Ser 1.08 (*)    GFR calc non Af Amer 47 (*)    GFR calc Af Amer 54 (*)    All other components within normal limits  CBC - Abnormal; Notable for the following components:   WBC 10.7 (*)    All other components within normal limits  I-STAT TROPONIN, ED - Abnormal; Notable for the following components:   Troponin i, poc 1.58 (*)    All other components within normal limits    EKG EKG Interpretation  Date/Time:  Tuesday June 14 2018 11:23:45 EDT Ventricular Rate:  70 PR Interval:    QRS Duration: 93 QT Interval:  442 QTC Calculation: 477 R Axis:   31 Text Interpretation:  Sinus rhythm Abnormal T, consider ischemia, diffuse leads Confirmed by Virgel Manifold [phone]) on 06/14/2018 11:31:57 AM   Radiology No results found.  Procedures Procedures (including critical care time)  CRITICAL CARE Performed by: Virgel Manifold Total critical care time: 35 minutes Critical care time was exclusive of separately billable procedures and treating other patients. Critical care was necessary to treat or prevent imminent or life-threatening deterioration. Critical care was time spent personally by me on the following activities: development of treatment plan with patient and/or surrogate as well as nursing, discussions with consultants, evaluation of patient's response to treatment, examination of patient, obtaining history from patient or surrogate, ordering and performing treatments and interventions, ordering and review of laboratory studies, ordering and review of radiographic studies,  pulse oximetry and re-evaluation of patient's condition.   Medications Ordered in ED Medications  aspirin chewable tablet 243 mg (has no administration in time range)  metoprolol tartrate (LOPRESSOR) injection 5 mg (has no administration in time range)     Initial Impression / Assessment and Plan / ED Course  I have reviewed the triage vital signs and the nursing notes.  Pertinent labs & imaging results that were available during my care of the patient were reviewed by me and considered in my medical decision making (see chart for details).     83 year old female who likely had MI yesterday and still persistent symptoms today although currently resolved. ASA/heparin. Even if she has a negative troponin, she will need admitted with very typical symptoms and abnormal/changed EKG.   Troponin is elevated. Will discuss with cardiology. Anticipate transfer to [facility].   Final Clinical Impressions(s) / ED Diagnoses   Final diagnoses:  NSTEMI (non-ST elevated myocardial infarction) [facility])    ED Discharge Orders    None       Virgel Manifold, MD 06/14/18 1221

## 2018-06-14 NOTE — Plan of Care (Signed)
  Problem: Health Behavior/Discharge Planning: Goal: Ability to manage health-related needs will improve Outcome: Progressing   Problem: Clinical Measurements: Goal: Ability to maintain clinical measurements within normal limits will improve Outcome: Progressing   Problem: Coping: Goal: Level of anxiety will decrease Outcome: Progressing   Problem: Pain Managment: Goal: General experience of comfort will improve Outcome: Progressing   

## 2018-06-14 NOTE — ED Triage Notes (Signed)
Pt c/o chest pain since yesterday after 3 mile walk, pt states she went home and took ASA yesterday and today. SOB since yesterday and remains today.Denies cough.

## 2018-06-14 NOTE — ED Notes (Signed)
Patient transported to X-ray 

## 2018-06-14 NOTE — ED Notes (Signed)
Attempted report x1. 

## 2018-06-14 NOTE — ED Notes (Signed)
ED TO INPATIENT HANDOFF REPORT  Name/Age/Gender [name] 83 y.o. female  Code Status   Home/SNF/Other Home  Chief Complaint SOB, chest pain  Level of Care/Admitting Diagnosis ED Disposition    ED Disposition Condition [facility] Area: [place] [100100]  Level of Care: Telemetry [5]  Diagnosis: NSTEMI (non-ST elevated myocardial infarction) [facility]) [[id number]]  Admitting Physician: Carron Brazen  Attending Physician: Josue Hector [[id number]]  Estimated length of stay: past midnight tomorrow  Certification:: I certify this patient will need inpatient services for at least 2 midnights  PT Class (Do Not Modify): Inpatient [101]  PT Acc Code (Do Not Modify): Private [1]       Medical History Past Medical History:  Diagnosis Date  . Arthritis   . Asthma   . Diabetes mellitus without complication ([place])   . Hypertension     Allergies No Known Allergies  IV Location/Drains/Wounds Patient Lines/Drains/Airways Status   Active Line/Drains/Airways    Name:   Placement date:   Placement time:   Site:   Days:   Peripheral IV 06/14/18 Left Forearm   06/14/18    1145    Forearm   less than 1   Peripheral IV 06/14/18 Right Antecubital   06/14/18    1216    Antecubital   less than 1   Incision (Closed) 02/26/15 Axilla Right   02/26/15    1341     1204          Labs/Imaging Results for orders placed or performed during the hospital encounter of 06/14/18 (from the past 48 hour(s))  Basic metabolic panel     Status: Abnormal   Collection Time: 06/14/18 11:42 AM  Result Value Ref Range   Sodium 138 135 - 145 mmol/L   Potassium 3.8 3.5 - 5.1 mmol/L   Chloride 103 98 - 111 mmol/L   CO2 22 22 - 32 mmol/L   Glucose, Bld 153 (H) 70 - 99 mg/dL   BUN 11 8 - 23 mg/dL   Creatinine, Ser 1.08 (H) 0.44 - 1.00 mg/dL   Calcium 9.6 8.9 - 10.3 mg/dL   GFR calc non Af Amer 47 (L) >60 mL/min   GFR calc Af Amer 54 (L) >60 mL/min    Comment:  (NOTE) The eGFR has been calculated using the CKD EPI equation. This calculation has not been validated in all clinical situations. eGFR's persistently <60 mL/min signify possible Chronic Kidney Disease.    Anion gap 13 5 - 15    Comment: Performed at [facility], [place] [address], [place], [place] [address]  CBC     Status: Abnormal   Collection Time: 06/14/18 11:42 AM  Result Value Ref Range   WBC 10.7 (H) 4.0 - 10.5 K/uL   RBC 4.80 3.87 - 5.11 MIL/uL   Hemoglobin 13.4 12.0 - 15.0 g/dL   HCT 42.1 36.0 - 46.0 %   MCV 87.7 80.0 - 100.0 fL   MCH 27.9 26.0 - 34.0 pg   MCHC 31.8 30.0 - 36.0 g/dL   RDW 14.7 11.5 - 15.5 %   Platelets 264 150 - 400 K/uL   nRBC 0.0 0.0 - 0.2 %    Comment: Performed at [facility], [place] [address] [place], [place] [address]  I-stat troponin, ED     Status: Abnormal   Collection Time: 06/14/18 11:46 AM  Result Value Ref Range   Troponin i, poc 1.58 (HH)  0.00 - 0.08 ng/mL   Comment NOTIFIED PHYSICIAN    Comment 3            Comment: Due to the release kinetics of cTnI, a negative result within the first hours of the onset of symptoms does not rule out myocardial infarction with certainty. If myocardial infarction is still suspected, repeat the test at appropriate intervals.   POC CBG, ED     Status: None   Collection Time: 06/14/18  1:14 PM  Result Value Ref Range   Glucose-Capillary 83 70 - 99 mg/dL   Dg Chest 2 View  Result Date: 06/14/2018 CLINICAL DATA:  Right upper chest pain. EXAM: CHEST - 2 VIEW COMPARISON:  CT 10/14/2015. FINDINGS: Stable mediastinal fullness consistent prominent great vessels and tortuosity of the thoracic aorta again noted. No interim change. Heart size stable. Lungs are clear. No pleural effusion or pneumothorax. No acute bony abnormality. Thoracic spine scoliosis. Surgical clips right axilla. IMPRESSION: No acute cardiopulmonary disease. Electronically Signed   By: Marcello Moores  Register    On: 06/14/2018 13:10    Pending Labs Unresulted Labs (From admission, onward)    Start     Ordered   06/15/18 0500  CBC  Daily,   R    Comments:  While on heparin drip    06/14/18 1234   06/14/18 2000  Heparin level (unfractionated)  Once-Timed,   R     06/14/18 1234          Vitals/Pain Today's Vitals   06/14/18 1545 06/14/18 1600 06/14/18 1615 06/14/18 1645  BP: 137/65 (!) 152/82    Pulse: 60 64 64 65  Resp: 16 15 (!) 22 20  Temp: 98.1 F (36.7 C)     TempSrc: Oral     SpO2: 97% 95% 97% 100%  Weight:      Height:      PainSc:        Isolation Precautions No active isolations  Medications Medications  heparin ADULT infusion 100 units/mL (25000 units/280m sodium chloride 0.45%) (800 Units/hr Intravenous New Bag/Given 06/14/18 1208)  0.9 %  sodium chloride infusion ( Intravenous New Bag/Given 06/14/18 1454)  ondansetron (ZOFRAN) injection 4 mg (has no administration in time range)  aspirin chewable tablet 243 mg (243 mg Oral Given 06/14/18 1210)  metoprolol tartrate (LOPRESSOR) injection 5 mg (5 mg Intravenous Given 06/14/18 1203)  heparin bolus via infusion 3,000 Units (3,000 Units Intravenous Bolus from Bag 06/14/18 1207)    Mobility walks

## 2018-06-14 NOTE — ED Notes (Signed)
ED Provider at bedside. 

## 2018-06-14 NOTE — ED Notes (Signed)
Ed provider [place] notified patient has a critical troponin value of 1.58

## 2018-06-14 NOTE — Progress Notes (Signed)
ANTICOAGULATION CONSULT NOTE - Initial Consult  Pharmacy Consult for heparin  Indication: chest pain/ACS  No Known Allergies  Patient Measurements: Height: 5\' 3"  (160 cm) Weight: 146 lb (66.2 kg) IBW/kg (Calculated) : 52.4 Heparin Dosing Weight: 65.7 kg  Vital Signs: Temp: 98.6 F (37 C) (10/22 1119) BP: 156/75 (10/22 1119) Pulse Rate: 75 (10/22 1119)  Labs: No results for input(s): HGB, HCT, PLT, APTT, LABPROT, INR, HEPARINUNFRC, HEPRLOWMOCWT, CREATININE, CKTOTAL, CKMB, TROPONINI in the last 72 hours.  CrCl cannot be calculated (Patient's most recent lab result is older than the maximum 21 days allowed.).   Medical History: Past Medical History:  Diagnosis Date  . Arthritis   . Asthma   . Diabetes mellitus without complication ([place])   . Hypertension    Assessment: 83 yo F to start heparin per pharmacy for CP r/o ACS.  She is not on any anticoagulants PTA.  No labs ordered yet.  Her CBC 03/31/2018 was WNL and her CMET 03/31/2018 was WNL.   Goal of Therapy:  Heparin level 0.3-0.7 units/ml Monitor platelets by anticoagulation protocol: Yes   Plan:  Give 3000 units bolus x 1 Start heparin infusion at 800 units/hr Check anti-Xa level in 8 hours and daily while on heparin Continue to monitor H&H and platelets  Eudelia Bunch, Pharm.D [phone] 06/14/2018 11:52 AM

## 2018-06-15 ENCOUNTER — Encounter (HOSPITAL_COMMUNITY): Payer: Self-pay

## 2018-06-15 ENCOUNTER — Encounter (HOSPITAL_COMMUNITY)
Admission: EM | Disposition: A | Discharge status: Home / Self Care | Payer: Self-pay | Source: Home / Self Care | Attending: Cardiovascular Disease

## 2018-06-15 HISTORY — PX: CORONARY STENT INTERVENTION: CATH118234

## 2018-06-15 HISTORY — PX: LEFT HEART CATH AND CORONARY ANGIOGRAPHY: CATH118249

## 2018-06-15 LAB — LIPID PANEL
CHOL/HDL RATIO: 2.6 ratio
Cholesterol: 108 mg/dL (ref 0–200)
HDL: 41 mg/dL (ref >40)
LDL CALC: 38 mg/dL (ref 0–99)
TRIGLYCERIDES: 146 mg/dL (ref <150)
VLDL: 29 mg/dL (ref 0–40)

## 2018-06-15 LAB — HEPARIN LEVEL (UNFRACTIONATED)
HEPARIN UNFRACTIONATED: 0.31 [IU]/mL (ref 0.30–0.70)
HEPARIN UNFRACTIONATED: 0.27 [IU]/mL — AB (ref 0.30–0.70)

## 2018-06-15 LAB — CBC
HCT: 38.9 % (ref 36.0–46.0)
Hemoglobin: 12.6 g/dL (ref 12.0–15.0)
MCH: 27.8 pg (ref 26.0–34.0)
MCHC: 32.4 g/dL (ref 30.0–36.0)
MCV: 85.9 fL (ref 80.0–100.0)
PLATELETS: 241 10*3/uL (ref 150–400)
RBC: 4.53 MIL/uL (ref 3.87–5.11)
RDW: 14.6 % (ref 11.5–15.5)
WBC: 9.6 10*3/uL (ref 4.0–10.5)
nRBC: 0 % (ref 0.0–0.2)

## 2018-06-15 LAB — TROPONIN I
Troponin I: 1.02 ng/mL (ref <0.03)
Troponin I: 0.87 ng/mL (ref <0.03)

## 2018-06-15 LAB — GLUCOSE, CAPILLARY
GLUCOSE-CAPILLARY: 81 mg/dL (ref 70–99)
Glucose-Capillary: 92 mg/dL (ref 70–99)
Glucose-Capillary: 90 mg/dL (ref 70–99)
Glucose-Capillary: 189 mg/dL — ABNORMAL HIGH (ref 70–99)

## 2018-06-15 LAB — POCT ACTIVATED CLOTTING TIME
Activated Clotting Time: 285 seconds
Activated Clotting Time: 395 seconds

## 2018-06-15 SURGERY — LEFT HEART CATH AND CORONARY ANGIOGRAPHY
Anesthesia: LOCAL

## 2018-06-15 MED ORDER — FENTANYL CITRATE (PF) 100 MCG/2ML IJ SOLN
INTRAMUSCULAR | Status: AC
  Filled 2018-06-15: qty 2

## 2018-06-15 MED ORDER — SODIUM CHLORIDE 0.9 % IV SOLN: 250.0000 mL | INTRAVENOUS | Status: DC | PRN

## 2018-06-15 MED ORDER — NALOXONE HCL 0.4 MG/ML IJ SOLN
INTRAMUSCULAR | Status: AC
  Filled 2018-06-15: qty 1

## 2018-06-15 MED ORDER — MIDAZOLAM HCL 2 MG/2ML IJ SOLN
INTRAMUSCULAR | Status: DC | PRN
  Administered 2018-06-15: 1 mg via INTRAVENOUS

## 2018-06-15 MED ORDER — HEPARIN (PORCINE) IN NACL 1000-0.9 UT/500ML-% IV SOLN
INTRAVENOUS | Status: DC | PRN
  Administered 2018-06-15 (×2): 500 mL

## 2018-06-15 MED ORDER — CLOPIDOGREL BISULFATE 300 MG PO TABS
ORAL_TABLET | ORAL | Status: DC | PRN
  Administered 2018-06-15: 600 mg via ORAL

## 2018-06-15 MED ORDER — MIDAZOLAM HCL 2 MG/2ML IJ SOLN
INTRAMUSCULAR | Status: AC
  Filled 2018-06-15: qty 2

## 2018-06-15 MED ORDER — HEART ATTACK BOUNCING BOOK
Freq: Once | Status: AC
  Administered 2018-06-15: 1
  Filled 2018-06-15: qty 1

## 2018-06-15 MED ORDER — AMLODIPINE BESYLATE 5 MG PO TABS
5.0000 mg | ORAL_TABLET | Freq: Every day | ORAL | Status: DC
  Administered 2018-06-16: 09:00:00 5 mg via ORAL
  Filled 2018-06-15: qty 1

## 2018-06-15 MED ORDER — FENTANYL CITRATE (PF) 100 MCG/2ML IJ SOLN
INTRAMUSCULAR | Status: DC | PRN
  Administered 2018-06-15: 100 ug via INTRAVENOUS

## 2018-06-15 MED ORDER — HEPARIN SODIUM (PORCINE) 1000 UNIT/ML IJ SOLN
INTRAMUSCULAR | Status: DC | PRN
  Administered 2018-06-15: 3000 [IU] via INTRAVENOUS
  Administered 2018-06-15: 3500 [IU] via INTRAVENOUS

## 2018-06-15 MED ORDER — SODIUM CHLORIDE 0.9% FLUSH: 3.0000 mL | INTRAVENOUS | Status: DC | PRN

## 2018-06-15 MED ORDER — SODIUM CHLORIDE 0.9 % WEIGHT BASED INFUSION
1.0000 mL/kg/h | INTRAVENOUS | Status: AC
  Administered 2018-06-15: 20:00:00 1 mL/kg/h via INTRAVENOUS

## 2018-06-15 MED ORDER — SODIUM CHLORIDE 0.9% FLUSH
3.0000 mL | Freq: Two times a day (BID) | INTRAVENOUS | Status: DC
  Administered 2018-06-16: 3 mL via INTRAVENOUS

## 2018-06-15 MED ORDER — CLOPIDOGREL BISULFATE 300 MG PO TABS
ORAL_TABLET | ORAL | Status: AC
  Filled 2018-06-15: qty 1

## 2018-06-15 MED ORDER — VERAPAMIL HCL 2.5 MG/ML IV SOLN
INTRAVENOUS | Status: DC | PRN
  Administered 2018-06-15 (×2): 10 mL via INTRA_ARTERIAL

## 2018-06-15 MED ORDER — HEPARIN (PORCINE) IN NACL 1000-0.9 UT/500ML-% IV SOLN
INTRAVENOUS | Status: AC
  Filled 2018-06-15: qty 1000

## 2018-06-15 MED ORDER — IOHEXOL 350 MG/ML SOLN
INTRAVENOUS | Status: DC | PRN
  Administered 2018-06-15: 130 mL via INTRA_ARTERIAL

## 2018-06-15 MED ORDER — ANGIOPLASTY BOOK
Freq: Once | Status: AC
  Administered 2018-06-15: 1
  Filled 2018-06-15: qty 1

## 2018-06-15 MED ORDER — LIDOCAINE HCL (PF) 1 % IJ SOLN
INTRAMUSCULAR | Status: DC | PRN
  Administered 2018-06-15: 2 mL

## 2018-06-15 MED ORDER — IRBESARTAN 75 MG PO TABS
150.0000 mg | ORAL_TABLET | Freq: Every day | ORAL | Status: DC
  Administered 2018-06-15 – 2018-06-16 (×2): 150 mg via ORAL
  Filled 2018-06-15: qty 2

## 2018-06-15 MED ORDER — CLOPIDOGREL BISULFATE 75 MG PO TABS
75.0000 mg | ORAL_TABLET | Freq: Every day | ORAL | Status: DC
  Administered 2018-06-16: 09:00:00 75 mg via ORAL
  Filled 2018-06-15: qty 1

## 2018-06-15 MED ORDER — NITROGLYCERIN 1 MG/10 ML FOR IR/CATH LAB
INTRA_ARTERIAL | Status: AC
  Filled 2018-06-15: qty 10

## 2018-06-15 MED ORDER — ONDANSETRON HCL 4 MG/2ML IJ SOLN: 4.0000 mg | Freq: Four times a day (QID) | INTRAMUSCULAR | Status: DC | PRN

## 2018-06-15 MED ORDER — HYDRALAZINE HCL 20 MG/ML IJ SOLN: 5.0000 mg | INTRAMUSCULAR | Status: AC | PRN

## 2018-06-15 MED ORDER — LIDOCAINE HCL (PF) 1 % IJ SOLN
INTRAMUSCULAR | Status: AC
  Filled 2018-06-15: qty 30

## 2018-06-15 MED ORDER — NITROGLYCERIN 1 MG/10 ML FOR IR/CATH LAB
INTRA_ARTERIAL | Status: DC | PRN
  Administered 2018-06-15: 200 ug via INTRACORONARY

## 2018-06-15 SURGICAL SUPPLY — 19 items
BALLN SAPPHIRE 2.0X12 (BALLOONS) ×2
BALLN SAPPHIRE ~~LOC~~ 2.25X12 (BALLOONS) ×1 IMPLANT
BALLN SAPPHIRE ~~LOC~~ 2.75X15 (BALLOONS) ×1 IMPLANT
BALLOON SAPPHIRE 2.0X12 (BALLOONS) IMPLANT
CATH INFINITI 5FR JK (CATHETERS) ×1 IMPLANT
CATH INFINITI JR4 5F (CATHETERS) ×1 IMPLANT
CATH LAUNCHER 6FR JR4 (CATHETERS) ×1 IMPLANT
DEVICE RAD COMP TR BAND LRG (VASCULAR PRODUCTS) ×1 IMPLANT
GLIDESHEATH SLEND SS 6F .021 (SHEATH) ×1 IMPLANT
GUIDEWIRE INQWIRE 1.5J.035X260 (WIRE) IMPLANT
INQWIRE 1.5J .035X260CM (WIRE) ×2
KIT ENCORE 26 ADVANTAGE (KITS) ×1 IMPLANT
KIT HEART LEFT (KITS) ×2 IMPLANT
PACK CARDIAC CATHETERIZATION (CUSTOM PROCEDURE TRAY) ×2 IMPLANT
STENT SIERRA 2.50 X 18 MM (Permanent Stent) ×1 IMPLANT
SYR MEDRAD MARK V 150ML (SYRINGE) IMPLANT
TRANSDUCER W/STOPCOCK (MISCELLANEOUS) ×2 IMPLANT
TUBING CIL FLEX 10 FLL-RA (TUBING) ×2 IMPLANT
WIRE RUNTHROUGH .014X180CM (WIRE) ×1 IMPLANT

## 2018-06-15 NOTE — Progress Notes (Signed)
ANTICOAGULATION CONSULT NOTE - Follow Up Consult  Pharmacy Consult for heparin Indication: NSTEMI  Labs: Recent Labs    06/14/18 1142 06/14/18 1928 06/15/18 0051  HGB 13.4  --   --   HCT 42.1  --   --   PLT 264  --   --   LABPROT  --  13.2  --   INR  --  1.01  --   HEPARINUNFRC  --  0.42 0.27*  CREATININE 1.08*  --   --   TROPONINI  --  1.09* 1.02*    Assessment: 83yo female now subtherapeutic on heparin after one level at goal; no gtt issues or signs of bleeding per RN.  Goal of Therapy:  Heparin level 0.3-0.7 units/ml   Plan:  Will increase heparin gtt by 1-2 units/kg/hr to 900 units/hr and check level with next scheduled lab draw.    Wynona Neat, PharmD, BCPS  06/15/2018,2:28 AM

## 2018-06-15 NOTE — Interval H&P Note (Signed)
Cath Lab Visit (complete for each Cath Lab visit)  Clinical Evaluation Leading to the Procedure:   ACS: Yes.    Non-ACS:  n/a    History and Physical Interval Note:  06/15/2018 3:49 PM  [name]  has presented today for surgery, with the diagnosis of ns  The various methods of treatment have been discussed with the patient and family. After consideration of risks, benefits and other options for treatment, the patient has consented to  Procedure(s): LEFT HEART CATH AND CORONARY ANGIOGRAPHY (N/A) as a surgical intervention .  The patient's history has been reviewed, patient examined, no change in status, stable for surgery.  I have reviewed the patient's chart and labs.  Questions were answered to the patient's satisfaction.     Kathlyn [place]

## 2018-06-15 NOTE — Progress Notes (Signed)
ANTICOAGULATION CONSULT NOTE - Follow Up Consult  Pharmacy Consult for Heparin Indication: chest pain/ACS/ NSTEMI  No Known Allergies  Patient Measurements: Height: 5\' 3"  (160 cm) Weight: 138 lb 7.2 oz (62.8 kg) IBW/kg (Calculated) : 52.4 Heparin Dosing Weight: 66 kg  Vital Signs: Temp: 98.4 F (36.9 C) (10/23 0454) Temp Source: Oral (10/23 0454) BP: 144/71 (10/23 0454) Pulse Rate: 64 (10/23 0454)  Labs: Recent Labs    06/14/18 1142 06/14/18 1928 06/15/18 0051 06/15/18 0542  HGB 13.4  --   --  12.6  HCT 42.1  --   --  38.9  PLT 264  --   --  241  LABPROT  --  13.2  --   --   INR  --  1.01  --   --   HEPARINUNFRC  --  0.42 0.27* 0.31  CREATININE 1.08*  --   --   --   TROPONINI  --  1.09* 1.02* 0.87*    Estimated Creatinine Clearance: 33.8 mL/min (A) (by C-G formula based on SCr of 1.08 mg/dL (H)).  Assessment:  83 yr old female seen at [facility] ED for chest pain/ACS/NSTEMI.  IV heparin begun prior to transfer to [facility]. Heparin level therapeutic at 0.31, drawn early after rate increase but will likely accumulate more. CBC stable, cath planned for today.   Goal of Therapy:  Heparin level 0.3-0.7 units/ml Monitor platelets by anticoagulation protocol: Yes   Plan:  -Continue IV heparin 900 units/h -Daily heparin level and CBC  Arrie Senate, PharmD, BCPS Clinical Pharmacist [phone] Please check AMION for all [place] numbers 06/15/2018

## 2018-06-15 NOTE — H&P (View-Only) (Signed)
Progress Note  Patient Name: [name] Date of Encounter: 06/15/2018  Primary Cardiologist: Sherren Mocha, MD  Subjective   Feeling well. No chest pain, sob or palpitations.   Inpatient Medications    Scheduled Meds: . amLODipine  5 mg Oral Daily   Or  . irbesartan  150 mg Oral Daily  . aspirin EC  81 mg Oral Daily  . cycloSPORINE  1 drop Both Eyes BID  . glipiZIDE  10 mg Oral BID  . insulin aspart  0-15 Units Subcutaneous TID WC  . insulin glargine  18 Units Subcutaneous QHS  . levothyroxine  50 mcg Oral Q0600  . linagliptin  5 mg Oral Daily  . metoprolol tartrate  12.5 mg Oral BID  . rosuvastatin  40 mg Oral q morning - 10a  . sodium chloride flush  3 mL Intravenous Q12H  . umeclidinium-vilanterol  1 puff Inhalation Daily   Continuous Infusions: . sodium chloride    . sodium chloride 1 mL/kg/hr (06/15/18 0740)  . heparin 900 Units/hr (06/15/18 0247)   PRN Meds: sodium chloride, acetaminophen, nitroGLYCERIN, sodium chloride flush   Vital Signs    Vitals:   06/14/18 2259 06/15/18 0300 06/15/18 0454 06/15/18 0500  BP: (!) 151/79  (!) 144/71   Pulse: 79  64   Resp:      Temp:   98.4 F (36.9 C)   TempSrc:  Oral Oral   SpO2:   99%   Weight:    62.8 kg  Height:        Intake/Output Summary (Last 24 hours) at 06/15/2018 0813 Last data filed at 06/15/2018 0259 Gross per 24 hour  Intake 987.13 ml  Output -  Net 987.13 ml   Filed Weights   06/14/18 1119 06/15/18 0500  Weight: 66.2 kg 62.8 kg    Telemetry    NSR - Personally Reviewed  ECG    SR with inferior lateral TWI - Personally Reviewed  Physical Exam   GEN: No acute distress.   Neck: No JVD Cardiac: RRR, no murmurs, rubs, or gallops.  Respiratory: Clear to auscultation bilaterally. GI: Soft, nontender, non-distended  MS: No edema; No deformity. Neuro:  Nonfocal  Psych: Normal affect   Labs    Chemistry Recent Labs  Lab 06/14/18 1142  NA 138  K 3.8  CL 103  CO2 22    GLUCOSE 153*  BUN 11  CREATININE 1.08*  CALCIUM 9.6  GFRNONAA 47*  GFRAA 54*  ANIONGAP 13     Hematology Recent Labs  Lab 06/14/18 1142 06/15/18 0542  WBC 10.7* 9.6  RBC 4.80 4.53  HGB 13.4 12.6  HCT 42.1 38.9  MCV 87.7 85.9  MCH 27.9 27.8  MCHC 31.8 32.4  RDW 14.7 14.6  PLT 264 241    Cardiac Enzymes Recent Labs  Lab 06/14/18 1928 06/15/18 0051 06/15/18 0542  TROPONINI 1.09* 1.02* 0.87*    Recent Labs  Lab 06/14/18 1146  TROPIPOC 1.58*     Radiology    Dg Chest 2 View  Result Date: 06/14/2018 CLINICAL DATA:  Right upper chest pain. EXAM: CHEST - 2 VIEW COMPARISON:  CT 10/14/2015. FINDINGS: Stable mediastinal fullness consistent prominent great vessels and tortuosity of the thoracic aorta again noted. No interim change. Heart size stable. Lungs are clear. No pleural effusion or pneumothorax. No acute bony abnormality. Thoracic spine scoliosis. Surgical clips right axilla. IMPRESSION: No acute cardiopulmonary disease. Electronically Signed   By: Marcello Moores  Register   On: 06/14/2018 13:10  Cardiac Studies   Pending cath today   Patient Profile     [name] is a 83 y.o. female with past medical history of hypertension, DM 2, and history of lymphoproliferative disease followed by Dr. Burr Medico presented with chest pain and ruled in for NSTEMI.   Assessment & Plan    1. NSTEMI - Peak of troponin 1.58. EKG with TWI inferiorlateral leads. Currently chest pain free. For cath later today. Continue ASA, statin, BB and heparin. 06/15/2018: Cholesterol 108; HDL 41; LDL Cholesterol 38; Triglycerides 146; VLDL 29  - Crestor increased to 40mg  this admission. Will adjust dose pending cath.   2. HTN - BP improving on current meds.  3. DM - Metformin on hold  For questions or updates, please contact [place] Please consult [email or web address] for contact info under     SignedLeanor Kail, PA  06/15/2018, 8:13 AM    Patient seen, examined. Available  data reviewed. Agree with findings, assessment, and plan as outlined by Robbie Lis, PA.  The patient is alert, oriented, in no distress.  She is had no recurrence of chest pain overnight.  She has no shortness of breath.  JVP is normal, lungs are clear, heart is regular rate and rhythm with no murmur gallop, abdomen soft nontender, extremities show no edema.  The patient continues on aspirin, a high intensity statin drug, a beta-blocker, and IV heparin.  She is had no recurrent ischemic symptoms.  She is pending cardiac catheterization and possible PCI today.  Her troponin is trending downward on serial labs.  Further plan/disposition pending her cardiac catheterization result.  Sherren Mocha, M.D. 06/15/2018 11:21 AM

## 2018-06-15 NOTE — Plan of Care (Signed)

## 2018-06-15 NOTE — Progress Notes (Addendum)
Progress Note  Patient Name: [name] Date of Encounter: 06/15/2018  Primary Cardiologist: Sherren Mocha, MD  Subjective   Feeling well. No chest pain, sob or palpitations.   Inpatient Medications    Scheduled Meds: . amLODipine  5 mg Oral Daily   Or  . irbesartan  150 mg Oral Daily  . aspirin EC  81 mg Oral Daily  . cycloSPORINE  1 drop Both Eyes BID  . glipiZIDE  10 mg Oral BID  . insulin aspart  0-15 Units Subcutaneous TID WC  . insulin glargine  18 Units Subcutaneous QHS  . levothyroxine  50 mcg Oral Q0600  . linagliptin  5 mg Oral Daily  . metoprolol tartrate  12.5 mg Oral BID  . rosuvastatin  40 mg Oral q morning - 10a  . sodium chloride flush  3 mL Intravenous Q12H  . umeclidinium-vilanterol  1 puff Inhalation Daily   Continuous Infusions: . sodium chloride    . sodium chloride 1 mL/kg/hr (06/15/18 0740)  . heparin 900 Units/hr (06/15/18 0247)   PRN Meds: sodium chloride, acetaminophen, nitroGLYCERIN, sodium chloride flush   Vital Signs    Vitals:   06/14/18 2259 06/15/18 0300 06/15/18 0454 06/15/18 0500  BP: (!) 151/79  (!) 144/71   Pulse: 79  64   Resp:      Temp:   98.4 F (36.9 C)   TempSrc:  Oral Oral   SpO2:   99%   Weight:    62.8 kg  Height:        Intake/Output Summary (Last 24 hours) at 06/15/2018 0813 Last data filed at 06/15/2018 0259 Gross per 24 hour  Intake 987.13 ml  Output -  Net 987.13 ml   Filed Weights   06/14/18 1119 06/15/18 0500  Weight: 66.2 kg 62.8 kg    Telemetry    NSR - Personally Reviewed  ECG    SR with inferior lateral TWI - Personally Reviewed  Physical Exam   GEN: No acute distress.   Neck: No JVD Cardiac: RRR, no murmurs, rubs, or gallops.  Respiratory: Clear to auscultation bilaterally. GI: Soft, nontender, non-distended  MS: No edema; No deformity. Neuro:  Nonfocal  Psych: Normal affect   Labs    Chemistry Recent Labs  Lab 06/14/18 1142  NA 138  K 3.8  CL 103  CO2 22    GLUCOSE 153*  BUN 11  CREATININE 1.08*  CALCIUM 9.6  GFRNONAA 47*  GFRAA 54*  ANIONGAP 13     Hematology Recent Labs  Lab 06/14/18 1142 06/15/18 0542  WBC 10.7* 9.6  RBC 4.80 4.53  HGB 13.4 12.6  HCT 42.1 38.9  MCV 87.7 85.9  MCH 27.9 27.8  MCHC 31.8 32.4  RDW 14.7 14.6  PLT 264 241    Cardiac Enzymes Recent Labs  Lab 06/14/18 1928 06/15/18 0051 06/15/18 0542  TROPONINI 1.09* 1.02* 0.87*    Recent Labs  Lab 06/14/18 1146  TROPIPOC 1.58*     Radiology    Dg Chest 2 View  Result Date: 06/14/2018 CLINICAL DATA:  Right upper chest pain. EXAM: CHEST - 2 VIEW COMPARISON:  CT 10/14/2015. FINDINGS: Stable mediastinal fullness consistent prominent great vessels and tortuosity of the thoracic aorta again noted. No interim change. Heart size stable. Lungs are clear. No pleural effusion or pneumothorax. No acute bony abnormality. Thoracic spine scoliosis. Surgical clips right axilla. IMPRESSION: No acute cardiopulmonary disease. Electronically Signed   By: Marcello Moores  Register   On: 06/14/2018 13:10  Cardiac Studies   Pending cath today   Patient Profile     [name] is a 83 y.o. female with past medical history of hypertension, DM 2, and history of lymphoproliferative disease followed by Dr. Burr Medico presented with chest pain and ruled in for NSTEMI.   Assessment & Plan    1. NSTEMI - Peak of troponin 1.58. EKG with TWI inferiorlateral leads. Currently chest pain free. For cath later today. Continue ASA, statin, BB and heparin. 06/15/2018: Cholesterol 108; HDL 41; LDL Cholesterol 38; Triglycerides 146; VLDL 29  - Crestor increased to 40mg  this admission. Will adjust dose pending cath.   2. HTN - BP improving on current meds.  3. DM - Metformin on hold  For questions or updates, please contact [place] Please consult [email or web address] for contact info under     SignedLeanor Kail, PA  06/15/2018, 8:13 AM    Patient seen, examined. Available  data reviewed. Agree with findings, assessment, and plan as outlined by Robbie Lis, PA.  The patient is alert, oriented, in no distress.  She is had no recurrence of chest pain overnight.  She has no shortness of breath.  JVP is normal, lungs are clear, heart is regular rate and rhythm with no murmur gallop, abdomen soft nontender, extremities show no edema.  The patient continues on aspirin, a high intensity statin drug, a beta-blocker, and IV heparin.  She is had no recurrent ischemic symptoms.  She is pending cardiac catheterization and possible PCI today.  Her troponin is trending downward on serial labs.  Further plan/disposition pending her cardiac catheterization result.  Sherren Mocha, M.D. 06/15/2018 11:21 AM

## 2018-06-15 NOTE — Progress Notes (Signed)
TR BAND REMOVAL  LOCATION:    right radial  DEFLATED PER PROTOCOL:    Yes.    TIME BAND OFF / DRESSING APPLIED:    21:00   SITE UPON ARRIVAL:    Level 0  SITE AFTER BAND REMOVAL:    Level 0  CIRCULATION SENSATION AND MOVEMENT:    Within Normal Limits   Yes.    COMMENTS:   Post TR band instructions given. Pt tolerated well.

## 2018-06-16 ENCOUNTER — Inpatient Hospital Stay (HOSPITAL_COMMUNITY): Payer: Medicare Other

## 2018-06-16 ENCOUNTER — Encounter (HOSPITAL_COMMUNITY): Payer: Self-pay | Admitting: Cardiovascular Disease

## 2018-06-16 DIAGNOSIS — I10 Essential (primary) hypertension: Secondary | ICD-10-CM

## 2018-06-16 DIAGNOSIS — E118 Type 2 diabetes mellitus with unspecified complications: Secondary | ICD-10-CM

## 2018-06-16 DIAGNOSIS — Z794 Long term (current) use of insulin: Secondary | ICD-10-CM

## 2018-06-16 DIAGNOSIS — E785 Hyperlipidemia, unspecified: Secondary | ICD-10-CM

## 2018-06-16 LAB — GLUCOSE, CAPILLARY
GLUCOSE-CAPILLARY: 63 mg/dL — AB (ref 70–99)
Glucose-Capillary: 138 mg/dL — ABNORMAL HIGH (ref 70–99)
Glucose-Capillary: 199 mg/dL — ABNORMAL HIGH (ref 70–99)

## 2018-06-16 LAB — CBC
HCT: 39.5 % (ref 36.0–46.0)
Hemoglobin: 12.9 g/dL (ref 12.0–15.0)
MCH: 28 pg (ref 26.0–34.0)
MCHC: 32.7 g/dL (ref 30.0–36.0)
MCV: 85.7 fL (ref 80.0–100.0)
Platelets: 255 10*3/uL (ref 150–400)
RBC: 4.61 MIL/uL (ref 3.87–5.11)
RDW: 14.6 % (ref 11.5–15.5)
WBC: 11.4 10*3/uL — ABNORMAL HIGH (ref 4.0–10.5)
nRBC: 0 % (ref 0.0–0.2)

## 2018-06-16 LAB — BASIC METABOLIC PANEL
Anion gap: 5 (ref 5–15)
BUN: 8 mg/dL (ref 8–23)
CALCIUM: 9.1 mg/dL (ref 8.9–10.3)
CO2: 29 mmol/L (ref 22–32)
CREATININE: 1.03 mg/dL — AB (ref 0.44–1.00)
Chloride: 108 mmol/L (ref 98–111)
GFR calc Af Amer: 57 mL/min — ABNORMAL LOW (ref >60)
GFR calc non Af Amer: 50 mL/min — ABNORMAL LOW (ref >60)
GLUCOSE: 59 mg/dL — AB (ref 70–99)
Potassium: 3.7 mmol/L (ref 3.5–5.1)
Sodium: 142 mmol/L (ref 135–145)

## 2018-06-16 LAB — ECHOCARDIOGRAM COMPLETE
Height: 63 in
Weight: 2186.96 oz

## 2018-06-16 MED ORDER — NITROGLYCERIN 0.4 MG SL SUBL: 0.4000 mg | SUBLINGUAL_TABLET | SUBLINGUAL | 1 refills | Status: DC | PRN

## 2018-06-16 MED ORDER — METOPROLOL SUCCINATE ER 25 MG PO TB24: 25.0000 mg | ORAL_TABLET | Freq: Every day | ORAL | 2 refills | Status: DC

## 2018-06-16 MED ORDER — ROSUVASTATIN CALCIUM 40 MG PO TABS: 40.0000 mg | ORAL_TABLET | Freq: Every morning | ORAL | 2 refills | Status: DC

## 2018-06-16 MED ORDER — CLOPIDOGREL BISULFATE 75 MG PO TABS: 75.0000 mg | ORAL_TABLET | Freq: Every day | ORAL | 1 refills | Status: DC

## 2018-06-16 MED FILL — ROSUVASTATIN CALCIUM 40 MG: 40 | 30 days supply | Qty: 30 | Fill #0 | Status: TO

## 2018-06-16 MED FILL — METOPROLOL SUCCINATE ER 25: 25 | 30 days supply | Qty: 30 | Fill #0 | Status: TO

## 2018-06-16 MED FILL — CLOPIDOGREL 75 MG TABLET: 75 | 30 days supply | Qty: 30 | Fill #0 | Status: TO

## 2018-06-16 MED FILL — NITROGLYCERIN 0.4 MG TAB SL: 0.4 | 25 days supply | Qty: 25 | Fill #0 | Status: TO

## 2018-06-16 NOTE — Progress Notes (Signed)
CARDIAC REHAB PHASE I   PRE:  Rate/Rhythm: 65 SR  BP:  Supine:   Sitting: 139/61  Standing:    SaO2:   MODE:  Ambulation: 1000 ft   POST:  Rate/Rhythm: 81 SR  BP:  Supine:   Sitting: 153/80  Standing:    SaO2:  0840-0935 Pt walked 1000 ft on RA with steady fast pace. Tolerated well. No CP. MI education completed with pt and daughter who voiced understanding. Stressed importance of plavix with stent. Discussed NTG use, MI restrictions, risk factors, gave diabetic and heart healthy diets, and ex ed. Discussed smoking cessation. Gave handout. Pt stated she is done smoking. Discussed CRP 2 and referred to [place]. Pt is very active and walks 3 miles most days so doubt she will do program.   Graylon Good, RN BSN  06/16/2018 9:38 AM

## 2018-06-16 NOTE — Discharge Summary (Addendum)
Discharge Summary    Patient ID: [name],  MRN: [id number], DOB/AGE: [birth year] 83 y.o.  Admit date: 06/14/2018 Discharge date: 06/16/2018  Primary Care Provider: Lucianne Lei Primary Cardiologist: Dr. Burt Knack   Discharge Diagnoses    Active Problems:   NSTEMI (non-ST elevated myocardial infarction) [facility])   Hypertension   Hyperlipidemia   Type 2 diabetes mellitus with complication, with long-term current use of insulin (HCC)  Allergies No Known Allergies  Diagnostic Studies/Procedures    Cath: 06/15/18   Ost Cx to Prox Cx lesion is 30% stenosed.  Prox RCA lesion is 99% stenosed.  Post intervention, there is a 0% residual stenosis.  A drug-eluting stent was successfully placed using a STENT SIERRA 2.50 X 18 MM.   1.  Severe one-vessel coronary artery disease with 99% proximal RCA stenosis which is moderately calcified and very fibrotic. 2.  Normal left ventricular end-diastolic pressure. 3.  Successful angioplasty and drug-eluting stent placement to the proximal right coronary artery.  Fully dilating the lesion was difficult and required using a noncompliant balloon to 20 atm before placing the stent.  Recommendations:  Recommend uninterrupted dual antiplatelet therapy with Aspirin 84m daily and Clopidogrel 763mdaily for a minimum of 12 months (ACS - Class I recommendation).   Recommend aggressive treatment of risk factors and cardiac rehab. Obtain an echocardiogram to evaluate LV systolic function.  Pigtail catheter was not advanced due to radial artery spasm.  The patient has moderate tortuosity in the innominate artery.  TTE: 06/16/18  Study Conclusions  - Left ventricle: The cavity size was normal. There was mild focal   basal hypertrophy of the septum. Systolic function was normal.   The estimated ejection fraction was in the range of 60% to 65%.   Mild hypokinesis of the inferolateral, inferior, and inferoseptal   myocardium. - Aortic valve:  There was no significant regurgitation. - Mitral valve: There was trivial regurgitation. - Atrial septum: No defect or patent foramen ovale was identified. - Tricuspid valve: There was trivial regurgitation. - Pulmonic valve: There was no significant regurgitation.  Impressions:  - Overall preserved EF with mild hypokinesis of inferior,   inferseptal, and inferolateral wall. _____________   History of Present Illness     [name] a pleasant [age]ear old African-American female with past medical history of hypertension, DM 2, and history of lymphoproliferative disease followed by Dr. FeBurr Medico She had a possible bilateral axillary adenopathy with no breast mass on previous mammogram in 2016.  She had a biopsy which showed atypical lymphoid proliferation. This has been followed on a annual basis by oncology service.  Her last follow-up with oncology was in August 2019, she was doing good at the time.  It was recommended to continue observation and a routine annual follow-up.  Otherwise, she denied any history of cardiac issue.  Both her mother and her sister died of heart disease.  Her sister passed away from heart disease before age 155 She herself had never been diagnosed with cardiac issues in the past.  She was in her usual state of health until the day prior to admission when she had a sudden onset of substernal sharp chest pain radiating down to the right arm.  This occurred while she was climbing up a hill.  She had been climbing the same hill for the past several months and had never had any exertional chest pain like that.  The sharp chest pain did not go away. It waxed and  waned from that morning all the way until the following morning when she presented to the ED. She also admited to having some dyspnea on exertion since as well.  Due to persistent chest discomfort, she was brought to the emergency room for further evaluation.  EKG showed T wave inversion in inferolateral leads.  First  point-of-care troponin was 1.58.  After she arrived in the ED, she was given a high-dose aspirin, metoprolol and started on IV heparin.  Her chest pain resolved in the emergency room this morning.  She was admitted for further work up with plans for cardiac cath.   Hospital Course     Troponin peaked at 1.09. Underwent cardiac cath noted above with successful PCI/DES x1 to the pRCA. Full dilation was difficult and required the use of noncompliant balloon before placing stent. Plan for DAPT with ASA/plavix for at least 6 months. No recurrent chest pain post cath. Worked well with cardiac rehab. Follow up echo showed normal EF with mild hypokinesis of inferior, inferoseptal, and inferolateral wall . Added low dose metoprolol with blood pressure tolerating, but unable to titrate 2/2 to bradycardia. Also increased her Crestor from 43m daily to 419mdaily. LDL noted at 38.   General: Well developed, well nourished, female appearing in no acute distress. Head: Normocephalic, atraumatic.  Neck: Supple without bruits, JVD. Lungs:  Resp regular and unlabored, CTA. Heart: RRR, S1, S2, soft systolic murmur; no rub. Abdomen: Soft, non-tender, non-distended with normoactive bowel sounds. No hepatomegaly. No rebound/guarding. No obvious abdominal masses. Extremities: No clubbing, cyanosis, edema. Distal pedal pulses are 2+ bilaterally. R radial cath site stable without bruising or hematoma Neuro: Alert and oriented X 3. Moves all extremities spontaneously. Psych: Normal affect.  HeKENYOTTA DORFMANas seen by Dr. CoBurt Knacknd determined stable for discharge home. Follow up in the office has been arranged. Medications are listed below.   _____________  Discharge Vitals Blood pressure (!) 157/66, pulse 61, temperature 98.3 F (36.8 C), resp. rate (!) 24, height _0  (1.6 m), weight 62 kg, SpO2 97 %.  Filed Weights   06/14/18 1119 06/15/18 0500 06/16/18 0343  Weight: 66.2 kg 62.8 kg 62 kg    Labs & Radiologic  Studies    CBC Recent Labs    06/15/18 0542 06/16/18 0328  WBC 9.6 11.4*  HGB 12.6 12.9  HCT 38.9 39.5  MCV 85.9 85.7  PLT 241 25846 Basic Metabolic Panel Recent Labs    06/14/18 1142 06/16/18 0328  NA 138 142  K 3.8 3.7  CL 103 108  CO2 22 29  GLUCOSE 153* 59*  BUN 11 8  CREATININE 1.08* 1.03*  CALCIUM 9.6 9.1   Liver Function Tests No results for input(s): AST, ALT, ALKPHOS, BILITOT, PROT, ALBUMIN in the last 72 hours. No results for input(s): LIPASE, AMYLASE in the last 72 hours. Cardiac Enzymes Recent Labs    06/14/18 1928 06/15/18 0051 06/15/18 0542  TROPONINI 1.09* 1.02* 0.87*   BNP Invalid input(s): POCBNP D-Dimer No results for input(s): DDIMER in the last 72 hours. Hemoglobin A1C No results for input(s): HGBA1C in the last 72 hours. Fasting Lipid Panel Recent Labs    06/15/18 0542  CHOL 108  HDL 41  LDLCALC 38  TRIG 146  CHOLHDL 2.6   Thyroid Function Tests No results for input(s): TSH, T4TOTAL, T3FREE, THYROIDAB in the last 72 hours.  Invalid input(s): FREET3 _____________  Dg Chest 2 View  Result Date: 06/14/2018 CLINICAL DATA:  Right upper  chest pain. EXAM: CHEST - 2 VIEW COMPARISON:  CT 10/14/2015. FINDINGS: Stable mediastinal fullness consistent prominent great vessels and tortuosity of the thoracic aorta again noted. No interim change. Heart size stable. Lungs are clear. No pleural effusion or pneumothorax. No acute bony abnormality. Thoracic spine scoliosis. Surgical clips right axilla. IMPRESSION: No acute cardiopulmonary disease. Electronically Signed   By: Marcello Moores  Register   On: 06/14/2018 13:10   Disposition   Pt is being discharged home today in good condition.  Follow-up Plans & Appointments    Follow-up Information    Liliane Shi, PA-C Follow up on 06/28/2018.   Specialties:  Cardiology, Physician Assistant Why:  at 8:45am for your follow up appt.  Contact information: [address] [place]  [address] [phone]          Discharge Instructions    AMB Referral to Cardiac Rehabilitation - Phase II   Complete by:  As directed    Diagnosis:   NSTEMI Coronary Stents     Amb Referral to Cardiac Rehabilitation   Complete by:  As directed    Diagnosis:   NSTEMI Coronary Stents        Discharge Medications     Medication List    TAKE these medications   ANORO ELLIPTA 62.5-25 MCG/INH Aepb Generic drug:  umeclidinium-vilanterol Inhale 1 puff into the lungs daily.   aspirin EC 81 MG tablet Take 81 mg by mouth daily.   AZOR 5-20 MG tablet Generic drug:  amLODipine-olmesartan Take 1 tablet by mouth every morning.   clopidogrel 75 MG tablet Commonly known as:  PLAVIX Take 1 tablet (75 mg total) by mouth daily with breakfast.   glipiZIDE 10 MG tablet Commonly known as:  GLUCOTROL Take 10 mg by mouth 2 (two) times daily.   insulin glargine 100 unit/mL Sopn Commonly known as:  LANTUS Inject 18 Units into the skin at bedtime.   levothyroxine 50 MCG tablet Commonly known as:  SYNTHROID, LEVOTHROID Take 1 tablet by mouth daily.   metFORMIN 500 MG tablet Commonly known as:  GLUCOPHAGE Take 1,000 mg by mouth at bedtime.   metoprolol succinate 25 MG 24 hr tablet Commonly known as:  TOPROL-XL Take 1 tablet (25 mg total) by mouth daily.   nitroGLYCERIN 0.4 MG SL tablet Commonly known as:  NITROSTAT Place 1 tablet (0.4 mg total) under the tongue every 5 (five) minutes x 3 doses as needed for chest pain.   RESTASIS MULTIDOSE 0.05 % ophthalmic emulsion Generic drug:  cycloSPORINE Apply 1 drop to eye 2 (two) times daily.   rosuvastatin 40 MG tablet Commonly known as:  CRESTOR Take 1 tablet (40 mg total) by mouth every morning. What changed:    medication strength  how much to take   sitaGLIPtin 100 MG tablet Commonly known as:  JANUVIA Take 100 mg by mouth every morning.   Vitamin D3 50000 units Caps Take 50,000 Units by mouth once a week. On Friday        Acute coronary syndrome (MI, NSTEMI, STEMI, etc) this admission?: Yes.     AHA/ACC Clinical Performance & Quality Measures: 1. Aspirin prescribed? - Yes 2. ADP Receptor Inhibitor (Plavix/Clopidogrel, Brilinta/Ticagrelor or Effient/Prasugrel) prescribed (includes medically managed patients)? - Yes 3. Beta Blocker prescribed? - Yes 4. High Intensity Statin (Lipitor 40-57m or Crestor 20-423m prescribed? - Yes 5. EF assessed during THIS hospitalization? - Yes 6. For EF <40%, was ACEI/ARB prescribed? - Not Applicable (EF >/= 4006%7. For EF <40%,  Aldosterone Antagonist (Spironolactone or Eplerenone) prescribed? - Not Applicable (EF >/= 02%) 8. Cardiac Rehab Phase II ordered (Included Medically managed Patients)? - Yes   Outstanding Labs/Studies   FLP/LFTs in 6 weeks if tolerating statin increase.   Duration of Discharge Encounter   Greater than 30 minutes including physician time.  Signed, Reino Bellis NP-C 06/16/2018, 12:15 PM   Patient seen, examined. Available data reviewed. Agree with findings, assessment, and plan as outlined by Reino Bellis, NP.  On exam the patient is a pleasant elderly woman in no distress.  Lungs are clear, heart is regular rate and rhythm with no murmur gallop, right radial site is clear, abdomen is soft and nontender, extremities show no edema.  The patient is ambulatory in the hallway with no symptoms.  She had critical stenosis of the RCA treated successfully with PTCA and stenting.  We reviewed the importance of adherence to dual antiplatelet therapy with aspirin and clopidogrel.  She is on appropriate medical therapy with an ARB in the context of CAD and diabetes, a beta-blocker, and a high intensity statin drug.  She appears stable for discharge and her follow-up is arranged as above.  Other secondary diagnoses: 1. Hypothyroidism - pt on synthroid will continue to follow with PCP 2. Diabetes with complication - NSTEMI/CAD - pt with long-term  insulin use 3. Asthma - chronic, controlled on chronic inhalers  Sherren Mocha, M.D. 06/16/2018 12:27 PM

## 2018-06-16 NOTE — Progress Notes (Signed)
Results for KATALEA, [facility] (MRN [id number]) as of 06/16/2018 10:58  Ref. Range 06/15/2018 11:46 06/15/2018 17:31 06/15/2018 21:27 06/16/2018 06:32 06/16/2018 07:10  Glucose-Capillary Latest Ref Range: 70 - 99 mg/dL 90 81 189 (H) 63 (L) 138 (H)  Noted that blood sugar was 63 mg/dl this am. Recommend decreasing Lantus to 15 units every HS if blood sugars continue to be less than 70 mg/dl. Will continue to monitor blood sugars while in the hospital.  Harvel Ricks RN BSN CDE Diabetes Coordinator Pager: [phone]  8am-5pm

## 2018-06-16 NOTE — Progress Notes (Signed)
  Echocardiogram 2D Echocardiogram has been performed.  [name] 06/16/2018, 10:28 AM

## 2018-06-20 ENCOUNTER — Telehealth (HOSPITAL_COMMUNITY): Payer: Self-pay

## 2018-06-20 NOTE — Telephone Encounter (Signed)
Pt's insurance is active and benefits verified through [facility] - $20.00 co-pay, no deductible, out of pocket amount of $4,400/$136.30 has been met, no co-insurance, and no pre-authorization is required. Passport/reference [phone]  Will make initial call to pt in regards to CR. If interested, pt will need to complete follow up appt. Once completed, pt will be contacted for scheduling.

## 2018-06-20 NOTE — Telephone Encounter (Signed)
Attempted to make initial call to pt in regards to CR - lm on vm

## 2018-06-27 ENCOUNTER — Encounter: Payer: Self-pay | Admitting: Physician Assistant

## 2018-06-27 NOTE — Progress Notes (Signed)
Cardiology Office Note:    Date:  06/28/2018   ID:  [name], DOB 05-16-1935, MRN [id number]  PCP:  Lucianne Lei, MD  Cardiologist:  Sherren Mocha, MD  Electrophysiologist:  None   Referring MD: Lucianne Lei, MD   Chief Complaint  Patient presents with  . Hospitalization Follow-up    s/p MI >> PCI    History of Present Illness:    [name] is a 83 y.o. female with diabetes, hypertension, lymphproliferative disorder.  She was admitted 10/22-10/24 with a NSTEMI.  Cardiac Catheterization demonstrated severe proximal RCA disease that was treated with a DES.  Her EF was normal on echocardiogram.    [name] returns for posthospitalization follow-up.  She is here with her daughter.  Since discharge, she has been doing well.  She has not had any further chest discomfort.  She denies shortness of breath, PND, edema, syncope.  She has not had any bleeding issues.  Prior CV studies:   The following studies were reviewed today:  Echo 06/16/18 Mild focal basal septal hypertrophy, EF 60-65, inf-lat, inf and inf-sept HK, trivial MR, trivial TR  Cardiac Catheterization 06/15/18 LM normal LAD irregs LCx ost 30 RCA prox 99 PCI:  2.5 x 18 mm Sierra DES to RCA  Past Medical History:  Diagnosis Date  . Arthritis   . Asthma   . CAD (coronary artery disease)    s/p NSTEMI 10/19 - LHC:  pRCA 99, oLCx 30 >> DES to RCA; Echo 10/19: Mild focal basal septal hypertrophy, EF 60-65, inf-lat, inf and inf-sept HK, trivial MR, trivial TR  . Diabetes mellitus without complication ([place])   . Hypertension    Surgical Hx: The patient  has a past surgical history that includes Abdominal hysterectomy ("many yrs ago"); Axillary lymph node biopsy (Right, 02/26/2015); Cholecystectomy; LEFT HEART CATH AND CORONARY ANGIOGRAPHY (N/A, 06/15/2018); and CORONARY STENT INTERVENTION (N/A, 06/15/2018).   Current Medications: Current Meds  Medication Sig  . amLODipine-olmesartan (AZOR) 5-20 MG per tablet Take 1  tablet by mouth every morning.   Marland Kitchen aspirin EC 81 MG tablet Take 81 mg by mouth daily.  . Cholecalciferol (VITAMIN D3) 50000 units CAPS Take 50,000 Units by mouth once a week. On Friday  . clopidogrel (PLAVIX) 75 MG tablet Take 1 tablet (75 mg total) by mouth daily with breakfast.  . glipiZIDE (GLUCOTROL) 10 MG tablet Take 10 mg by mouth 2 (two) times daily.  . insulin glargine (LANTUS) 100 unit/mL SOPN Inject 18 Units into the skin at bedtime.   Marland Kitchen levothyroxine (SYNTHROID, LEVOTHROID) 50 MCG tablet Take 1 tablet by mouth daily.  . metFORMIN (GLUCOPHAGE) 500 MG tablet Take 1,000 mg by mouth at bedtime.   . metoprolol succinate (TOPROL XL) 25 MG 24 hr tablet Take 1 tablet (25 mg total) by mouth daily.  . nitroGLYCERIN (NITROSTAT) 0.4 MG SL tablet Place 1 tablet (0.4 mg total) under the tongue every 5 (five) minutes x 3 doses as needed for chest pain.  Marland Kitchen RESTASIS MULTIDOSE 0.05 % ophthalmic emulsion Apply 1 drop to eye 2 (two) times daily.   . rosuvastatin (CRESTOR) 40 MG tablet Take 1 tablet (40 mg total) by mouth every morning.  . sitaGLIPtin (JANUVIA) 100 MG tablet Take 100 mg by mouth every morning.   Marland Kitchen Umeclidinium-Vilanterol (ANORO ELLIPTA) 62.5-25 MCG/INH AEPB Inhale 1 puff into the lungs daily.   . [DISCONTINUED] clopidogrel (PLAVIX) 75 MG tablet Take 1 tablet (75 mg total) by mouth daily with breakfast.  . [  DISCONTINUED] metoprolol succinate (TOPROL XL) 25 MG 24 hr tablet Take 1 tablet (25 mg total) by mouth daily.  . [DISCONTINUED] rosuvastatin (CRESTOR) 40 MG tablet Take 1 tablet (40 mg total) by mouth every morning.     Allergies:   Patient has no known allergies.   Social History   Tobacco Use  . Smoking status: Current Every Day Smoker    Packs/day: 0.50    Years: 60.00    Pack years: 30.00  . Smokeless tobacco: Never Used  Substance Use Topics  . Alcohol use: No  . Drug use: No     Family Hx: The patient's family history includes Diabetes in her brother, father, mother,  and sister; Heart disease in her mother and sister.  ROS:   Please see the history of present illness.    ROS All other systems reviewed and are negative.   EKGs/Labs/Other Test Reviewed:    EKG:  EKG is  ordered today.  The ekg ordered today demonstrates normal sinus rhythm, heart rate 62, normal axis, nonspecific ST-T wave changes, QTC 401  Recent Labs: 03/31/2018: ALT 12 06/16/2018: BUN 8; Creatinine, Ser 1.03; Hemoglobin 12.9; Platelets 255; Potassium 3.7; Sodium 142   Recent Lipid Panel Lab Results  Component Value Date/Time   CHOL 108 06/15/2018 05:42 AM   TRIG 146 06/15/2018 05:42 AM   HDL 41 06/15/2018 05:42 AM   CHOLHDL 2.6 06/15/2018 05:42 AM   LDLCALC 38 06/15/2018 05:42 AM    Physical Exam:    VS:  BP 134/62   Pulse 62   Ht 5\' 3"  (1.6 m)   Wt 140 lb 1.9 oz (63.6 kg)   SpO2 96%   BMI 24.82 kg/m     Wt Readings from Last 3 Encounters:  06/28/18 140 lb 1.9 oz (63.6 kg)  06/16/18 136 lb 11 oz (62 kg)  03/31/18 141 lb 6.4 oz (64.1 kg)     Physical Exam  Constitutional: She is oriented to person, place, and time. She appears well-developed and well-nourished. No distress.  HENT:  Head: Normocephalic and atraumatic.  Eyes: No scleral icterus.  Neck: No JVD present. No thyromegaly present.  Cardiovascular: Normal rate and regular rhythm.  Murmur heard.  Systolic murmur is present with a grade of 2/6 at the upper right sternal border. Pulmonary/Chest: Effort normal. She has no rales.  Abdominal: Soft.  Musculoskeletal: She exhibits no edema.  R wrist without hematoma  Lymphadenopathy:    She has no cervical adenopathy.  Neurological: She is alert and oriented to person, place, and time.  Skin: Skin is warm and dry.  Psychiatric: She has a normal mood and affect.    ASSESSMENT & PLAN:    NSTEMI (non-ST elevated myocardial infarction) [facility])  She is status post recent non-ST elevation myocardial infarction.  Cardiac catheterization demonstrated severe  proximal RCA stenosis which was treated with a drug-eluting stent.  She is doing well without recurrent angina.  We discussed the importance of uninterrupted dual antiplatelet therapy for a minimum of 1 year.  She is interested in cardiac rehabilitation.  I have asked her to contact us if she does not hear from cardiac rehabilitation by the end of the month.  Continue aspirin, clopidogrel, beta-blocker, statin.  Essential hypertension  The patient's blood pressure is controlled on her current regimen.  Continue current therapy.   Hyperlipidemia, unspecified hyperlipidemia type Continue high intensity statin.  Arrange follow-up lipids and LFTs in 2 months.  Type 2 diabetes mellitus with complication, with long-term  current use of insulin ([place]) Managed by primary care.  Empagliflozin could be considered given the results of the EMPA-REG OUTCOME trial.    Dispo:  Return in about 3 months (around 09/28/2018) for Routine Follow Up, w/ Dr. Burt Knack.   Medication Adjustments/Labs and Tests Ordered: Current medicines are reviewed at length with the patient today.  Concerns regarding medicines are outlined above.  Tests Ordered: Orders Placed This Encounter  Procedures  . Hepatic function panel  . Lipid panel  . EKG 12-Lead   Medication Changes: Meds ordered this encounter  Medications  . rosuvastatin (CRESTOR) 40 MG tablet    Sig: Take 1 tablet (40 mg total) by mouth every morning.    Dispense:  90 tablet    Refill:  3  . clopidogrel (PLAVIX) 75 MG tablet    Sig: Take 1 tablet (75 mg total) by mouth daily with breakfast.    Dispense:  90 tablet    Refill:  3  . metoprolol succinate (TOPROL XL) 25 MG 24 hr tablet    Sig: Take 1 tablet (25 mg total) by mouth daily.    Dispense:  90 tablet    Refill:  3    Signed, Richardson Dopp, PA-C  06/28/2018 10:47 AM    [address], [name], [place]  [address] Phone: [phone]; Fax: [phone]

## 2018-06-28 ENCOUNTER — Encounter: Payer: Self-pay | Admitting: Physician Assistant

## 2018-06-28 ENCOUNTER — Ambulatory Visit: Payer: Medicare Other | Admitting: Physician Assistant

## 2018-06-28 VITALS — BP 134/62 | HR 62 | Ht 63.0 in | Wt 140.1 lb

## 2018-06-28 DIAGNOSIS — I214 Non-ST elevation (NSTEMI) myocardial infarction: Secondary | ICD-10-CM

## 2018-06-28 DIAGNOSIS — I10 Essential (primary) hypertension: Secondary | ICD-10-CM

## 2018-06-28 DIAGNOSIS — E118 Type 2 diabetes mellitus with unspecified complications: Secondary | ICD-10-CM | POA: Diagnosis not present

## 2018-06-28 DIAGNOSIS — Z794 Long term (current) use of insulin: Secondary | ICD-10-CM

## 2018-06-28 DIAGNOSIS — E785 Hyperlipidemia, unspecified: Secondary | ICD-10-CM | POA: Diagnosis not present

## 2018-06-28 MED ORDER — METOPROLOL SUCCINATE ER 25 MG PO TB24: 25.0000 mg | ORAL_TABLET | Freq: Every day | ORAL | 3 refills | Status: DC

## 2018-06-28 MED ORDER — ROSUVASTATIN CALCIUM 40 MG PO TABS: 40.0000 mg | ORAL_TABLET | Freq: Every morning | ORAL | 3 refills | Status: DC

## 2018-06-28 MED ORDER — CLOPIDOGREL BISULFATE 75 MG PO TABS: 75.0000 mg | ORAL_TABLET | Freq: Every day | ORAL | 3 refills | Status: DC

## 2018-06-28 NOTE — Patient Instructions (Signed)
Medication Instructions:  Your physician recommends that you continue on your current medications as directed. Please refer to the Current Medication list given to you today.  * YOU MAY TAKE CRESTOR 5 MG 8 TABLETS DAILY TO EQUAL 40 MG. * A NEW RX HAS BEEN SENT IN FOR CRESTOR 40 MG DAILY  If you need a refill on your cardiac medications before your next appointment, please call your pharmacy.   Lab work: LABS TO BE DONE IN 2 WEEKS: LFTs, LIPIDS If you have labs (blood work) drawn today and your tests are completely normal, you will receive your results only by: Marland Kitchen MyChart Message (if you have MyChart) OR . A paper copy in the mail If you have any lab test that is abnormal or we need to change your treatment, we will call you to review the results.  Testing/Procedures: NONE  Follow-Up: At [facility], you and your health needs are our priority.  As part of our continuing mission to provide you with exceptional heart care, we have created designated Provider Care Teams.  These Care Teams include your primary Cardiologist (physician) and Advanced Practice Providers (APPs -  Physician Assistants and Nurse Practitioners) who all work together to provide you with the care you need, when you need it. You will need a follow up appointment in:  3 months.  P You may see Sherren Mocha, MDor one of the following Advanced Practice Providers on your designated Care Team: Richardson Dopp, PA-C [place], [place] . Daune Perch, NP  Any Other Special Instructions Will Be Listed Below (If Applicable).

## 2018-06-29 DIAGNOSIS — L603 Nail dystrophy: Secondary | ICD-10-CM | POA: Diagnosis not present

## 2018-06-29 DIAGNOSIS — E1151 Type 2 diabetes mellitus with diabetic peripheral angiopathy without gangrene: Secondary | ICD-10-CM | POA: Diagnosis not present

## 2018-06-29 DIAGNOSIS — I739 Peripheral vascular disease, unspecified: Secondary | ICD-10-CM | POA: Diagnosis not present

## 2018-06-30 ENCOUNTER — Telehealth (HOSPITAL_COMMUNITY): Payer: Self-pay

## 2018-06-30 NOTE — Telephone Encounter (Signed)
Called and spoke with patient in regards to Cardiac Rehab - scheduled orientation on 09/01/17 at 8:30am. Pt will attend the 8:15am exc class. Went over insurance with pt and she verbalized understanding. Mailed packet.

## 2018-07-12 ENCOUNTER — Other Ambulatory Visit: Payer: Medicare Other

## 2018-07-12 DIAGNOSIS — E785 Hyperlipidemia, unspecified: Secondary | ICD-10-CM | POA: Diagnosis not present

## 2018-07-12 DIAGNOSIS — I214 Non-ST elevation (NSTEMI) myocardial infarction: Secondary | ICD-10-CM

## 2018-07-12 DIAGNOSIS — I10 Essential (primary) hypertension: Secondary | ICD-10-CM

## 2018-07-12 LAB — LIPID PANEL
CHOLESTEROL TOTAL: 109 mg/dL (ref 100–199)
Chol/HDL Ratio: 2.2 ratio (ref 0.0–4.4)
HDL: 50 mg/dL (ref >39)
LDL Calculated: 30 mg/dL (ref 0–99)
TRIGLYCERIDES: 143 mg/dL (ref 0–149)
VLDL Cholesterol Cal: 29 mg/dL (ref 5–40)

## 2018-07-12 LAB — HEPATIC FUNCTION PANEL
ALK PHOS: 78 IU/L (ref 39–117)
ALT: 36 IU/L — ABNORMAL HIGH (ref 0–32)
AST: 25 IU/L (ref 0–40)
Albumin: 4.2 g/dL (ref 3.5–4.7)
BILIRUBIN, DIRECT: 0.18 mg/dL (ref 0.00–0.40)
Bilirubin Total: 0.7 mg/dL (ref 0.0–1.2)
Total Protein: 7.4 g/dL (ref 6.0–8.5)

## 2018-07-13 ENCOUNTER — Telehealth: Payer: Self-pay

## 2018-07-13 DIAGNOSIS — I10 Essential (primary) hypertension: Secondary | ICD-10-CM

## 2018-07-13 DIAGNOSIS — I214 Non-ST elevation (NSTEMI) myocardial infarction: Secondary | ICD-10-CM

## 2018-07-13 NOTE — Telephone Encounter (Signed)
-----   Message from Liliane Shi, [place] sent at 07/12/2018  3:58 PM EST ----- Lipids at goal.  LFTs ok.  ALT mildly elevated. Recommendations:  - Continue current medications and follow up as planned.   - Repeat LFTs in 4 weeks.  Richardson Dopp, PA-C    07/12/2018 3:57 PM

## 2018-07-13 NOTE — Telephone Encounter (Signed)
Reviewed results with patient who verbalized understanding. Per Richardson Dopp, PA-C recommendation to get LFTs in 4 weeks Pt lab appt 12/18. Order placed in [place].

## 2018-08-04 DIAGNOSIS — E039 Hypothyroidism, unspecified: Secondary | ICD-10-CM | POA: Diagnosis not present

## 2018-08-04 DIAGNOSIS — I10 Essential (primary) hypertension: Secondary | ICD-10-CM | POA: Diagnosis not present

## 2018-08-04 DIAGNOSIS — E782 Mixed hyperlipidemia: Secondary | ICD-10-CM | POA: Diagnosis not present

## 2018-08-04 DIAGNOSIS — E118 Type 2 diabetes mellitus with unspecified complications: Secondary | ICD-10-CM | POA: Diagnosis not present

## 2018-08-05 DIAGNOSIS — E782 Mixed hyperlipidemia: Secondary | ICD-10-CM | POA: Diagnosis not present

## 2018-08-05 DIAGNOSIS — E039 Hypothyroidism, unspecified: Secondary | ICD-10-CM | POA: Diagnosis not present

## 2018-08-05 DIAGNOSIS — E118 Type 2 diabetes mellitus with unspecified complications: Secondary | ICD-10-CM | POA: Diagnosis not present

## 2018-08-05 DIAGNOSIS — I10 Essential (primary) hypertension: Secondary | ICD-10-CM | POA: Diagnosis not present

## 2018-08-10 ENCOUNTER — Other Ambulatory Visit: Payer: Medicare Other

## 2018-08-10 DIAGNOSIS — I214 Non-ST elevation (NSTEMI) myocardial infarction: Secondary | ICD-10-CM | POA: Diagnosis not present

## 2018-08-10 DIAGNOSIS — I10 Essential (primary) hypertension: Secondary | ICD-10-CM

## 2018-08-10 LAB — HEPATIC FUNCTION PANEL
ALT: 42 IU/L — AB (ref 0–32)
AST: 36 IU/L (ref 0–40)
Albumin: 3.9 g/dL (ref 3.5–4.7)
Alkaline Phosphatase: 83 IU/L (ref 39–117)
BILIRUBIN TOTAL: 0.6 mg/dL (ref 0.0–1.2)
Bilirubin, Direct: 0.17 mg/dL (ref 0.00–0.40)
Total Protein: 6.9 g/dL (ref 6.0–8.5)

## 2018-08-11 DIAGNOSIS — Z0000 Encounter for general adult medical examination without abnormal findings: Secondary | ICD-10-CM | POA: Diagnosis not present

## 2018-08-11 DIAGNOSIS — R310 Gross hematuria: Secondary | ICD-10-CM | POA: Diagnosis not present

## 2018-08-12 ENCOUNTER — Telehealth: Payer: Self-pay

## 2018-08-12 DIAGNOSIS — E785 Hyperlipidemia, unspecified: Secondary | ICD-10-CM

## 2018-08-12 DIAGNOSIS — I10 Essential (primary) hypertension: Secondary | ICD-10-CM

## 2018-08-12 MED ORDER — ROSUVASTATIN CALCIUM 20 MG PO TABS: 20.0000 mg | ORAL_TABLET | Freq: Every day | ORAL | 3 refills | Status: DC

## 2018-08-12 NOTE — Telephone Encounter (Signed)
Reviewed results with pt who verbalized understanding. Per Richardson Dopp, PA-C to reduce Crestor to 20 mg qd do and have pt come in for lipids and lfts in 3 months. Lab appt has been schedule for 11/14/18. Pt would like Crestor to be sent to [facility] on [address] with day Rx and 3 refills. Pt states that she has just followed up with PCP on 12/13. Pt thanked me for the call. Orders have been placed in Epic.

## 2018-08-23 ENCOUNTER — Telehealth (HOSPITAL_COMMUNITY): Payer: Self-pay

## 2018-08-23 NOTE — Telephone Encounter (Signed)
Cardiac Rehab Medication Review by a Pharmacist  Does the patient  feel that his/her medications are working for him/her?  yes  Has the patient been experiencing any side effects to the medications prescribed?  Yes, does feel like she gets tired more quickly than before  Does the patient measure his/her own blood pressure or blood glucose at home?  Yes BP 120s-130s/80s, BG 90s-100s   Does the patient have any problems obtaining medications due to transportation or finances?   no  Understanding of regimen: good Understanding of indications: good Potential of compliance: good    Pharmacist comments: Counseled the patient on the ability for her Metoprolol to make her get tired a bit more quickly, but that with time, she should hopefully return back to her normal energy level.    Jackson Latino, PharmD PGY1 Pharmacy Resident Phone [phone] 08/23/2018     11:30 AM

## 2018-08-30 ENCOUNTER — Encounter (HOSPITAL_COMMUNITY): Payer: Self-pay

## 2018-08-30 ENCOUNTER — Encounter (HOSPITAL_COMMUNITY)
Admission: RE | Admit: 2018-08-30 | Discharge: 2018-08-30 | Disposition: A | Discharge status: Home / Self Care | Payer: Medicare Other | Source: Ambulatory Visit | Attending: Cardiovascular Disease | Admitting: Cardiovascular Disease

## 2018-08-30 VITALS — BP 124/80 | HR 69 | Ht 63.25 in | Wt 144.0 lb

## 2018-08-30 DIAGNOSIS — Z955 Presence of coronary angioplasty implant and graft: Secondary | ICD-10-CM

## 2018-08-30 DIAGNOSIS — I214 Non-ST elevation (NSTEMI) myocardial infarction: Secondary | ICD-10-CM | POA: Diagnosis not present

## 2018-08-30 NOTE — Progress Notes (Addendum)
[name] 83 y.o. female DOB 05/19/1935 MRN [id number]       Nutrition  No diagnosis found. Past Medical History:  Diagnosis Date  . Arthritis   . Asthma   . CAD (coronary artery disease)    s/p NSTEMI 10/19 - [facility]:  pRCA 99, oLCx 30 >> DES to RCA; Echo 10/19: Mild focal basal septal hypertrophy, EF 60-65, inf-lat, inf and inf-sept HK, trivial MR, trivial TR  . Diabetes mellitus without complication ([place])   . Hypertension    Meds reviewed.    Current Outpatient Medications (Endocrine & Metabolic):  .  glipiZIDE (GLUCOTROL) 10 MG tablet, Take 10 mg by mouth 2 (two) times daily. .  insulin glargine (LANTUS) 100 unit/mL SOPN, Inject 18 Units into the skin at bedtime.  Marland Kitchen  levothyroxine (SYNTHROID, LEVOTHROID) 50 MCG tablet, Take 1 tablet by mouth daily. .  metFORMIN (GLUCOPHAGE) 500 MG tablet, Take 1,000 mg by mouth at bedtime.  .  sitaGLIPtin (JANUVIA) 100 MG tablet, Take 100 mg by mouth every morning.   Current Outpatient Medications (Cardiovascular):  .  amLODipine-olmesartan (AZOR) 5-20 MG per tablet, Take 1 tablet by mouth every morning.  .  metoprolol succinate (TOPROL XL) 25 MG 24 hr tablet, Take 1 tablet (25 mg total) by mouth daily. .  nitroGLYCERIN (NITROSTAT) 0.4 MG SL tablet, Place 1 tablet (0.4 mg total) under the tongue every 5 (five) minutes x 3 doses as needed for chest pain. .  rosuvastatin (CRESTOR) 20 MG tablet, Take 1 tablet (20 mg total) by mouth daily.  Current Outpatient Medications (Respiratory):  Marland Kitchen  Umeclidinium-Vilanterol (ANORO ELLIPTA) 62.5-25 MCG/INH AEPB, Inhale 1 puff into the lungs daily.   Current Outpatient Medications (Analgesics):  .  aspirin EC 81 MG tablet, Take 81 mg by mouth daily.  Current Outpatient Medications (Hematological):  .  clopidogrel (PLAVIX) 75 MG tablet, Take 1 tablet (75 mg total) by mouth daily with breakfast.  Current Outpatient Medications (Other):  Marland Kitchen  Cholecalciferol (VITAMIN D3) 50000 units CAPS, Take 50,000 Units by  mouth once a week. On Friday .  RESTASIS MULTIDOSE 0.05 % ophthalmic emulsion, Apply 1 drop to eye 2 (two) times daily.    HT: Ht Readings from Last 1 Encounters:  06/28/18 5\' 3"  (1.6 m)    WT: Wt Readings from Last 5 Encounters:  06/28/18 140 lb 1.9 oz (63.6 kg)  06/16/18 136 lb 11 oz (62 kg)  03/31/18 141 lb 6.4 oz (64.1 kg)  04/01/17 144 lb 1.6 oz (65.4 kg)  04/02/16 146 lb 3.2 oz (66.3 kg)     BMI = 24.83    06/28/18  Current tobacco use? No       Labs:  Lipid Panel     Component Value Date/Time   CHOL 109 07/12/2018 0828   TRIG 143 07/12/2018 0828   HDL 50 07/12/2018 0828   CHOLHDL 2.2 07/12/2018 0828   CHOLHDL 2.6 06/15/2018 0542   VLDL 29 06/15/2018 0542   LDLCALC 30 07/12/2018 0828    No results found for: HGBA1C CBG (last 3)  No results for input(s): GLUCAP in the last 72 hours.  Nutrition Diagnosis ? Food-and nutrition-related knowledge deficit related to lack of exposure to information as related to diagnosis of: ? CVD ? Type 2 Diabetes   Nutrition Goal(s):  ? To be determined  Plan:  Pt to attend nutrition classes ? Nutrition I ? Nutrition II ? Portion Distortion  ? Diabetes Blitz ? Diabetes Q & A Will provide client-centered  nutrition education as part of interdisciplinary care.   Monitor and evaluate progress toward nutrition goal with team.  Laurina Bustle, MS, RD, LDN 08/30/2018 8:17 AM

## 2018-08-30 NOTE — Progress Notes (Signed)
[name] 84 y.o. female DOB: 11/01/1934 MRN: [id number]      Nutrition Note  1. 06/14/18 NSTEMI   2. 06/15/18 DES RCA    Past Medical History:  Diagnosis Date  . Arthritis   . Asthma   . CAD (coronary artery disease)    s/p NSTEMI 10/19 - [facility]:  pRCA 99, oLCx 30 >> DES to RCA; Echo 10/19: Mild focal basal septal hypertrophy, EF 60-65, inf-lat, inf and inf-sept HK, trivial MR, trivial TR  . Diabetes mellitus without complication ([place])   . Hypertension    Meds reviewed.   Current Outpatient Medications (Endocrine & Metabolic):  .  glipiZIDE (GLUCOTROL) 10 MG tablet, Take 10 mg by mouth 2 (two) times daily. .  insulin glargine (LANTUS) 100 unit/mL SOPN, Inject 18 Units into the skin at bedtime.  Marland Kitchen  levothyroxine (SYNTHROID, LEVOTHROID) 50 MCG tablet, Take 1 tablet by mouth daily. .  metFORMIN (GLUCOPHAGE) 500 MG tablet, Take 1,000 mg by mouth at bedtime.  .  sitaGLIPtin (JANUVIA) 100 MG tablet, Take 100 mg by mouth every morning.   Current Outpatient Medications (Cardiovascular):  .  amLODipine-olmesartan (AZOR) 5-20 MG per tablet, Take 1 tablet by mouth every morning.  .  metoprolol succinate (TOPROL XL) 25 MG 24 hr tablet, Take 1 tablet (25 mg total) by mouth daily. .  nitroGLYCERIN (NITROSTAT) 0.4 MG SL tablet, Place 1 tablet (0.4 mg total) under the tongue every 5 (five) minutes x 3 doses as needed for chest pain. .  rosuvastatin (CRESTOR) 20 MG tablet, Take 1 tablet (20 mg total) by mouth daily.  Current Outpatient Medications (Respiratory):  Marland Kitchen  Umeclidinium-Vilanterol (ANORO ELLIPTA) 62.5-25 MCG/INH AEPB, Inhale 1 puff into the lungs daily.   Current Outpatient Medications (Analgesics):  .  aspirin EC 81 MG tablet, Take 81 mg by mouth daily.  Current Outpatient Medications (Hematological):  .  clopidogrel (PLAVIX) 75 MG tablet, Take 1 tablet (75 mg total) by mouth daily with breakfast.  Current Outpatient Medications (Other):  Marland Kitchen  Cholecalciferol (VITAMIN D3) 50000 units  CAPS, Take 50,000 Units by mouth once a week. On Friday .  RESTASIS MULTIDOSE 0.05 % ophthalmic emulsion, Apply 1 drop to eye 2 (two) times daily.    HT: Ht Readings from Last 1 Encounters:  08/30/18 5' 3.25" (1.607 m)    WT: Wt Readings from Last 5 Encounters:  08/30/18 143 lb 15.4 oz (65.3 kg)  06/28/18 140 lb 1.9 oz (63.6 kg)  06/16/18 136 lb 11 oz (62 kg)  03/31/18 141 lb 6.4 oz (64.1 kg)  04/01/17 144 lb 1.6 oz (65.4 kg)     Body mass index is 25.3 kg/m.   Current tobacco use? No  Labs:  Lipid Panel     Component Value Date/Time   CHOL 109 07/12/2018 0828   TRIG 143 07/12/2018 0828   HDL 50 07/12/2018 0828   CHOLHDL 2.2 07/12/2018 0828   CHOLHDL 2.6 06/15/2018 0542   VLDL 29 06/15/2018 0542   LDLCALC 30 07/12/2018 0828    No results found for: HGBA1C CBG (last 3)  No results for input(s): GLUCAP in the last 72 hours.  Nutrition Note Spoke with pt. Nutrition plan and goals reviewed with pt. Pt is following Step 2 of the Therapeutic Lifestyle Changes diet. Pt wants to maintain wt, pt is at a healthy weight for her age. Heart healthy diabetic eating tips reviewed (label reading, how to build a healthy plate, portion sizes, eating frequently across the day, complex vs  simple carbs). Pt is doing a good job of balancing eating heart healthy diabetic diet. Pt shared she does occasionally get bored with meals, reviewed heart healthy websites for recipes and printed off new recipes for pt to try. Pt has Type 2 Diabetes. Last A1c indicates blood glucose well-controlled. This Probation officer went over Diabetes Education test results. Pt checks CBG's 3 times a day. Fasting CBG's reportedly 80-110 mg/dL. Pt with dx of CAD, HTN. Per discussion, pt does not use canned/convenience foods often. Pt does not add salt to food. Pt does not eat out frequently. Pt expressed understanding of the information reviewed. Pt aware of nutrition education classes offered and would like to attend nutrition  classes.  Nutrition Diagnosis ? Food-and nutrition-related knowledge deficit related to lack of exposure to information as related to diagnosis of: ? CVD ? Type 2 Diabetes  Nutrition Intervention ? Pt's individual nutrition plan and goals reviewed with pt. Pt given handouts for: ? heart healthy recipes  Nutrition Goal(s):  ? Pt to identify and limit food sources of saturated fat, trans fat, refined carbohydrates and sodium ? Pt to try 1 new heart healthy recipe a week.  Plan:  ? Pt to attend nutrition classes ? Nutrition I ? Nutrition II ? Portion Distortion  ? Diabetes Blitz ? Diabetes Q & Ae determined ? Will provide client-centered nutrition education as part of interdisciplinary care ? Monitor and evaluate progress toward nutrition goal with team.   Laurina Bustle, MS, RD, LDN 08/30/2018 3:37 PM

## 2018-08-30 NOTE — Progress Notes (Signed)
Cardiac Individual Treatment Plan  Patient Details  Name: [name] MRN: [id number] Date of Birth: September 16, 1934 Referring Provider:   Flowsheet Row CARDIAC REHAB PHASE II ORIENTATION from 08/30/2018 in [facility]  Referring Provider  Sherren Mocha, MD      Initial Encounter Date:  [facility] PHASE II ORIENTATION from 08/30/2018 in [facility]  Date  08/30/18      Visit Diagnosis: 06/14/18 NSTEMI  06/15/18 DES RCA  Patient's Home Medications on Admission:  Current Outpatient Medications:  .  amLODipine-olmesartan (AZOR) 5-20 MG per tablet, Take 1 tablet by mouth every morning. , Disp: , Rfl:  .  aspirin EC 81 MG tablet, Take 81 mg by mouth daily., Disp: , Rfl:  .  Cholecalciferol (VITAMIN D3) 50000 units CAPS, Take 50,000 Units by mouth once a week. On Friday, Disp: , Rfl: 1 .  clopidogrel (PLAVIX) 75 MG tablet, Take 1 tablet (75 mg total) by mouth daily with breakfast., Disp: 90 tablet, Rfl: 3 .  glipiZIDE (GLUCOTROL) 10 MG tablet, Take 10 mg by mouth 2 (two) times daily., Disp: , Rfl:  .  insulin glargine (LANTUS) 100 unit/mL SOPN, Inject 18 Units into the skin at bedtime. , Disp: , Rfl:  .  levothyroxine (SYNTHROID, LEVOTHROID) 50 MCG tablet, Take 1 tablet by mouth daily., Disp: , Rfl:  .  metFORMIN (GLUCOPHAGE) 500 MG tablet, Take 1,000 mg by mouth at bedtime. , Disp: , Rfl:  .  metoprolol succinate (TOPROL XL) 25 MG 24 hr tablet, Take 1 tablet (25 mg total) by mouth daily., Disp: 90 tablet, Rfl: 3 .  nitroGLYCERIN (NITROSTAT) 0.4 MG SL tablet, Place 1 tablet (0.4 mg total) under the tongue every 5 (five) minutes x 3 doses as needed for chest pain., Disp: 25 tablet, Rfl: 1 .  RESTASIS MULTIDOSE 0.05 % ophthalmic emulsion, Apply 1 drop to eye 2 (two) times daily. , Disp: , Rfl:  .  rosuvastatin (CRESTOR) 20 MG tablet, Take 1 tablet (20 mg total) by mouth daily., Disp: 90 tablet, Rfl: 3 .  sitaGLIPtin  (JANUVIA) 100 MG tablet, Take 100 mg by mouth every morning. , Disp: , Rfl:  .  Umeclidinium-Vilanterol (ANORO ELLIPTA) 62.5-25 MCG/INH AEPB, Inhale 1 puff into the lungs daily. , Disp: , Rfl:   Past Medical History: Past Medical History:  Diagnosis Date  . Arthritis   . Asthma   . CAD (coronary artery disease)    s/p NSTEMI 10/19 - [facility]:  pRCA 99, oLCx 30 >> DES to RCA; Echo 10/19: Mild focal basal septal hypertrophy, EF 60-65, inf-lat, inf and inf-sept HK, trivial MR, trivial TR  . Diabetes mellitus without complication ([place])   . Hypertension     Tobacco Use: Social History   Tobacco Use  Smoking Status Current Every Day Smoker  . Packs/day: 0.50  . Years: 60.00  . Pack years: 30.00  Smokeless Tobacco Never Used    Labs: Recent Review Flowsheet Data    Labs for ITP Cardiac and Pulmonary Rehab Latest Ref Rng & Units 06/15/2018 07/12/2018   Cholestrol 100 - 199 mg/dL 108 109   LDLCALC 0 - 99 mg/dL 38 30   HDL >39 mg/dL 41 50   Trlycerides 0 - 149 mg/dL 146 143      Capillary Blood Glucose: Lab Results  Component Value Date   GLUCAP 199 (H) 06/16/2018   GLUCAP 138 (H) 06/16/2018   GLUCAP 63 (L) 06/16/2018  GLUCAP 189 (H) 06/15/2018   GLUCAP 81 06/15/2018     Exercise Target Goals: Exercise Program Goal: Individual exercise prescription set using results from initial 6 min walk test and THRR while considering  patient's activity barriers and safety.   Exercise Prescription Goal: Initial exercise prescription builds to 30-45 minutes a day of aerobic activity, 2-3 days per week.  Home exercise guidelines will be given to patient during program as part of exercise prescription that the participant will acknowledge.  Activity Barriers & Risk Stratification: Activity Barriers & Cardiac Risk Stratification - 08/30/18 0840    Activity Barriers & Cardiac Risk Stratification          Activity Barriers  Arthritis;Other (comment)    Comments  Vertigo    Cardiac Risk  Stratification  High           6 Minute Walk: 6 Minute Walk    6 Minute Walk    Row Name 08/30/18 0933   Phase  Initial   Distance  1210 feet   Walk Time  6 minutes   # of Rest Breaks  1 38 seconds   MPH  2.29   METS  2.02   RPE  11   Perceived Dyspnea   0   VO2 Peak  7.08   Symptoms  Yes (comment)   Comments  Patient c/o dizziness.   Resting HR  69 bpm   Resting BP  124/80   Resting Oxygen Saturation   94 %   Exercise Oxygen Saturation  during 6 min walk  97 %   Max Ex. HR  88 bpm   Max Ex. BP  124/60   2 Minute Post BP  140/80          Oxygen Initial Assessment:   Oxygen Re-Evaluation:   Oxygen Discharge (Final Oxygen Re-Evaluation):   Initial Exercise Prescription: Initial Exercise Prescription - 08/30/18 1100    Date of Initial Exercise RX and Referring Provider          Date  08/30/18    Referring Provider  Sherren Mocha, MD    Expected Discharge Date  11/25/18        Recumbant Bike          Level  2    Minutes  10    METs  2        NuStep          Level  2    SPM  85    Minutes  10    METs  2        Track          Laps  8    Minutes  10    METs  2.39        Prescription Details          Frequency (times per week)  3    Duration  Progress to 30 minutes of continuous aerobic without signs/symptoms of physical distress        Intensity          THRR 40-80% of Max Heartrate  56-111    Ratings of Perceived Exertion  11-13    Perceived Dyspnea  0-4        Progression          Progression  Continue to progress workloads to maintain intensity without signs/symptoms of physical distress.        Art gallery manager  Prescription  Yes    Weight  2lbs    Reps  10-15           Perform Capillary Blood Glucose checks as needed.  Exercise Prescription Changes:   Exercise Comments:   Exercise Goals and Review: Exercise Goals    Exercise Goals    Row Name 08/30/18 0842   Increase Physical Activity   Yes   Intervention  Provide advice, education, support and counseling about physical activity/exercise needs.;Develop an individualized exercise prescription for aerobic and resistive training based on initial evaluation findings, risk stratification, comorbidities and participant's personal goals.   Expected Outcomes  Short Term: Attend rehab on a regular basis to increase amount of physical activity.;Long Term: Add in home exercise to make exercise part of routine and to increase amount of physical activity.;Long Term: Exercising regularly at least 3-5 days a week.   Increase Strength and Stamina  Yes   Intervention  Provide advice, education, support and counseling about physical activity/exercise needs.;Develop an individualized exercise prescription for aerobic and resistive training based on initial evaluation findings, risk stratification, comorbidities and participant's personal goals.   Expected Outcomes  Short Term: Increase workloads from initial exercise prescription for resistance, speed, and METs.;Short Term: Perform resistance training exercises routinely during rehab and add in resistance training at home;Long Term: Improve cardiorespiratory fitness, muscular endurance and strength as measured by increased METs and functional capacity (6MWT)   Able to understand and use rate of perceived exertion (RPE) scale  Yes   Intervention  Provide education and explanation on how to use RPE scale   Expected Outcomes  Short Term: Able to use RPE daily in rehab to express subjective intensity level;Long Term:  Able to use RPE to guide intensity level when exercising independently   Knowledge and understanding of Target Heart Rate Range (THRR)  Yes   Intervention  Provide education and explanation of THRR including how the numbers were predicted and where they are located for reference   Expected Outcomes  Short Term: Able to state/look up THRR;Long Term: Able to use THRR to govern intensity when  exercising independently;Short Term: Able to use daily as guideline for intensity in rehab   Able to check pulse independently  Yes   Intervention  Provide education and demonstration on how to check pulse in carotid and radial arteries.;Review the importance of being able to check your own pulse for safety during independent exercise   Expected Outcomes  Short Term: Able to explain why pulse checking is important during independent exercise;Long Term: Able to check pulse independently and accurately   Understanding of Exercise Prescription  Yes   Intervention  Provide education, explanation, and written materials on patient's individual exercise prescription   Expected Outcomes  Short Term: Able to explain program exercise prescription;Long Term: Able to explain home exercise prescription to exercise independently          Exercise Goals Re-Evaluation :   Discharge Exercise Prescription (Final Exercise Prescription Changes):   Nutrition:  Target Goals: Understanding of nutrition guidelines, daily intake of sodium 1500mg , cholesterol 200mg , calories 30% from fat and 7% or less from saturated fats, daily to have 5 or more servings of fruits and vegetables.  Biometrics: Pre Biometrics - 08/30/18 0830    Pre Biometrics          Height  5' 3.25" (1.607 m)    Weight  65.3 kg    Waist Circumference  34.5 inches    Hip Circumference  37 inches  Waist to Hip Ratio  0.93 %    BMI (Calculated)  25.29    Triceps Skinfold  17 mm    % Body Fat  36 %    Grip Strength  27.5 kg    Flexibility  9.75 in    Single Leg Stand  9.1 seconds            Nutrition Therapy Plan and Nutrition Goals:   Nutrition Assessments:   Nutrition Goals Re-Evaluation:   Nutrition Goals Re-Evaluation:   Nutrition Goals Discharge (Final Nutrition Goals Re-Evaluation):   Psychosocial: Target Goals: Acknowledge presence or absence of significant depression and/or stress, maximize coping skills,  provide positive support system. Participant is able to verbalize types and ability to use techniques and skills needed for reducing stress and depression.  Initial Review & Psychosocial Screening: Initial Psych Review & Screening - 08/30/18 1155    Initial Review          Current issues with  None Identified        Family Dynamics          Good Support System?  Yes   Daughter-Cathy Jacqualin Combes        Barriers          Psychosocial barriers to participate in program  There are no identifiable barriers or psychosocial needs.        Screening Interventions          Interventions  Encouraged to exercise           Quality of Life Scores: Quality of Life - 08/30/18 1037    Quality of Life Scores          Health/Function Pre  29.6 %    Socioeconomic Pre  30 %    Psych/Spiritual Pre  30 %    Family Pre  30 %    GLOBAL Pre  29.8 %          Scores of 19 and below usually indicate a poorer quality of life in these areas.  A difference of  2-3 points is a clinically meaningful difference.  A difference of 2-3 points in the total score of the Quality of Life Index has been associated with significant improvement in overall quality of life, self-image, physical symptoms, and general health in studies assessing change in quality of life.  PHQ-9: Recent Review Flowsheet Data    There is no flowsheet data to display.     Interpretation of Total Score  Total Score Depression Severity:  1-4 = Minimal depression, 5-9 = Mild depression, 10-14 = Moderate depression, 15-19 = Moderately severe depression, 20-27 = Severe depression   Psychosocial Evaluation and Intervention:   Psychosocial Re-Evaluation:   Psychosocial Discharge (Final Psychosocial Re-Evaluation):   Vocational Rehabilitation: Provide vocational rehab assistance to qualifying candidates.   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 Participant will state understanding/return demonstration of topics presented.  Learning Barriers/Preferences: Learning Barriers/Preferences - 08/30/18 6073    Learning Barriers/Preferences          Learning Barriers  Sight    Learning Preferences  Written Material  Education Topics: Count Your Pulse:  -Group instruction provided by verbal instruction, demonstration, patient participation and written materials to support subject.  Instructors address importance of being able to find your pulse and how to count your pulse when at home without a heart monitor.  Patients get hands on experience counting their pulse with staff help and individually.   Heart Attack, Angina, and Risk Factor Modification:  -Group instruction provided by verbal instruction, video, and written materials to support subject.  Instructors address signs and symptoms of angina and heart attacks.    Also discuss risk factors for heart disease and how to make changes to improve heart health risk factors.   Functional Fitness:  -Group instruction provided by verbal instruction, demonstration, patient participation, and written materials to support subject.  Instructors address safety measures for doing things around the house.  Discuss how to get up and down off the floor, how to pick things up properly, how to safely get out of a chair without assistance, and balance training.   Meditation and Mindfulness:  -Group instruction provided by verbal instruction, patient participation, and written materials to support subject.  Instructor addresses importance of mindfulness and meditation practice to help reduce stress and improve awareness.  Instructor also leads participants through a meditation  exercise.    Stretching for Flexibility and Mobility:  -Group instruction provided by verbal instruction, patient participation, and written materials to support subject.  Instructors lead participants through series of stretches that are designed to increase flexibility thus improving mobility.  These stretches are additional exercise for major muscle groups that are typically performed during regular warm up and cool down.   Hands Only CPR:  -Group verbal, video, and participation provides a basic overview of AHA guidelines for community CPR. Role-play of emergencies allow participants the opportunity to practice calling for help and chest compression technique with discussion of AED use.   Hypertension: -Group verbal and written instruction that provides a basic overview of hypertension including the most recent diagnostic guidelines, risk factor reduction with self-care instructions and medication management.    Nutrition I class: Heart Healthy Eating:  -Group instruction provided by PowerPoint slides, verbal discussion, and written materials to support subject matter. The instructor gives an explanation and review of the Therapeutic Lifestyle Changes diet recommendations, which includes a discussion on lipid goals, dietary fat, sodium, fiber, plant stanol/sterol esters, sugar, and the components of a well-balanced, healthy diet.   Nutrition II class: Lifestyle Skills:  -Group instruction provided by PowerPoint slides, verbal discussion, and written materials to support subject matter. The instructor gives an explanation and review of label reading, grocery shopping for heart health, heart healthy recipe modifications, and ways to make healthier choices when eating out.   Diabetes Question & Answer:  -Group instruction provided by PowerPoint slides, verbal discussion, and written materials to support subject matter. The instructor gives an explanation and review of diabetes  co-morbidities, pre- and post-prandial blood glucose goals, pre-exercise blood glucose goals, signs, symptoms, and treatment of hypoglycemia and hyperglycemia, and foot care basics.   Diabetes Blitz:  -Group instruction provided by PowerPoint slides, verbal discussion, and written materials to support subject matter. The instructor gives an explanation and review of the physiology behind type 1 and type 2 diabetes, diabetes medications and rational behind using different medications, pre- and post-prandial blood glucose recommendations and Hemoglobin A1c goals, diabetes diet, and exercise including blood glucose guidelines for exercising safely.    Portion Distortion:  -Group instruction provided by PowerPoint slides,  verbal discussion, written materials, and food models to support subject matter. The instructor gives an explanation of serving size versus portion size, changes in portions sizes over the last 20 years, and what consists of a serving from each food group.   Stress Management:  -Group instruction provided by verbal instruction, video, and written materials to support subject matter.  Instructors review role of stress in heart disease and how to cope with stress positively.     Exercising on Your Own:  -Group instruction provided by verbal instruction, power point, and written materials to support subject.  Instructors discuss benefits of exercise, components of exercise, frequency and intensity of exercise, and end points for exercise.  Also discuss use of nitroglycerin and activating EMS.  Review options of places to exercise outside of rehab.  Review guidelines for sex with heart disease.   Cardiac Drugs I:  -Group instruction provided by verbal instruction and written materials to support subject.  Instructor reviews cardiac drug classes: antiplatelets, anticoagulants, beta blockers, and statins.  Instructor discusses reasons, side effects, and lifestyle considerations for each  drug class.   Cardiac Drugs II:  -Group instruction provided by verbal instruction and written materials to support subject.  Instructor reviews cardiac drug classes: angiotensin converting enzyme inhibitors (ACE-I), angiotensin II receptor blockers (ARBs), nitrates, and calcium channel blockers.  Instructor discusses reasons, side effects, and lifestyle considerations for each drug class.   Anatomy and Physiology of the Circulatory System:  Group verbal and written instruction and models provide basic cardiac anatomy and physiology, with the coronary electrical and arterial systems. Review of: AMI, Angina, Valve disease, Heart Failure, Peripheral Artery Disease, Cardiac Arrhythmia, Pacemakers, and the ICD.   Other Education:  -Group or individual verbal, written, or video instructions that support the educational goals of the cardiac rehab program.   Holiday Eating Survival Tips:  -Group instruction provided by PowerPoint slides, verbal discussion, and written materials to support subject matter. The instructor gives patients tips, tricks, and techniques to help them not only survive but enjoy the holidays despite the onslaught of food that accompanies the holidays.   Knowledge Questionnaire Score: Knowledge Questionnaire Score - 08/30/18 1156    Knowledge Questionnaire Score          Pre Score  --   did not complete quiz           Core Components/Risk Factors/Patient Goals at Admission: Personal Goals and Risk Factors at Admission - 08/30/18 1135    Core Components/Risk Factors/Patient Goals on Admission          Tobacco Cessation  Yes    Number of packs per day  0.5   none since heart attack   Intervention  Offer self-teaching materials, assist with locating and accessing local/national Quit Smoking programs, and support quit date choice.    Expected Outcomes  Short Term: Will quit all tobacco product use, adhering to prevention of relapse plan.;Long Term: Complete abstinence  from all tobacco products for at least 12 months from quit date.    Diabetes  Yes    Intervention  Provide education about signs/symptoms and action to take for hypo/hyperglycemia.;Provide education about proper nutrition, including hydration, and aerobic/resistive exercise prescription along with prescribed medications to achieve blood glucose in normal ranges: Fasting glucose 65-99 mg/dL    Expected Outcomes  Short Term: Participant verbalizes understanding of the signs/symptoms and immediate care of hyper/hypoglycemia, proper foot care and importance of medication, aerobic/resistive exercise and nutrition plan for blood glucose control.;Long Term: Attainment  of HbA1C < 7%.    Hypertension  Yes    Intervention  Provide education on lifestyle modifcations including regular physical activity/exercise, weight management, moderate sodium restriction and increased consumption of fresh fruit, vegetables, and low fat dairy, alcohol moderation, and smoking cessation.;Monitor prescription use compliance.    Expected Outcomes  Short Term: Continued assessment and intervention until BP is < 140/51mm HG in hypertensive participants. < 130/39mm HG in hypertensive participants with diabetes, heart failure or chronic kidney disease.;Long Term: Maintenance of blood pressure at goal levels.    Lipids  Yes    Intervention  Provide education and support for participant on nutrition & aerobic/resistive exercise along with prescribed medications to achieve LDL 70mg , HDL >40mg .    Expected Outcomes  Short Term: Participant states understanding of desired cholesterol values and is compliant with medications prescribed. Participant is following exercise prescription and nutrition guidelines.;Long Term: Cholesterol controlled with medications as prescribed, with individualized exercise RX and with personalized nutrition plan. Value goals: LDL < 70mg , HDL > 40 mg.           Core Components/Risk Factors/Patient Goals Review:     Core Components/Risk Factors/Patient Goals at Discharge (Final Review):    ITP Comments: ITP Comments    Row Name 08/30/18 0839   ITP Comments  Medical Director- Dr. Fransico Him, MD      Comments: Patient attended orientation from [phone] to 1019  to review rules and guidelines for program. Completed 6 minute walk test, Intitial ITP, and exercise prescription.  VSS. Telemetry-sinus rhythm.  Asymptomatic.

## 2018-09-07 ENCOUNTER — Encounter (HOSPITAL_COMMUNITY): Payer: Self-pay

## 2018-09-07 ENCOUNTER — Encounter (HOSPITAL_COMMUNITY)
Admission: RE | Admit: 2018-09-07 | Discharge: 2018-09-07 | Disposition: A | Discharge status: Home / Self Care | Payer: Medicare Other | Source: Ambulatory Visit | Attending: Cardiovascular Disease | Admitting: Cardiovascular Disease

## 2018-09-07 ENCOUNTER — Encounter (HOSPITAL_COMMUNITY): Payer: Medicare Other

## 2018-09-07 DIAGNOSIS — I214 Non-ST elevation (NSTEMI) myocardial infarction: Secondary | ICD-10-CM | POA: Diagnosis not present

## 2018-09-07 DIAGNOSIS — Z955 Presence of coronary angioplasty implant and graft: Secondary | ICD-10-CM | POA: Diagnosis not present

## 2018-09-07 LAB — GLUCOSE, CAPILLARY
GLUCOSE-CAPILLARY: 228 mg/dL — AB (ref 70–99)
GLUCOSE-CAPILLARY: 162 mg/dL — AB (ref 70–99)

## 2018-09-07 NOTE — Progress Notes (Signed)
Daily Session Note  Patient Details  Name: [name] MRN: [id number] Date of Birth: Oct 15, 1934 Referring Provider:   Flowsheet Row CARDIAC REHAB PHASE II ORIENTATION from 08/30/2018 in [facility]  Referring Provider  Sherren Mocha, MD      Encounter Date: 09/07/2018  Check In: Session Check In - 09/07/18 0846    Check-In          Supervising physician immediately available to respond to emergencies  [facility] immediately available    Physician(s)  Dr. Tana Coast    Location  [facility]    Staff Present  Barnet Pall, RN, BSN;Shontell Prosser, RN, Mosie Epstein, MS,ACSM CEP, Exercise Physiologist;Dalton Kris Mouton, MS, Exercise Physiologist;Brittany Durene Fruits, BS, ACSM CEP, Exercise Physiologist    Medication changes reported      No    Fall or balance concerns reported     No    Tobacco Cessation  No Change    Warm-up and Cool-down  Performed as group-led instruction    Resistance Training Performed  Yes    VAD Patient?  No    PAD/SET Patient?  No        Pain Assessment          Currently in Pain?  No/denies    Multiple Pain Sites  No           Capillary Blood Glucose: Results for orders placed or performed during the hospital encounter of 09/07/18 (from the past 24 hour(s))  Glucose, capillary     Status: Abnormal   Collection Time: 09/07/18  9:28 AM  Result Value Ref Range   Glucose-Capillary 162 (H) 70 - 99 mg/dL    Exercise Prescription Changes - 09/07/18 0900    Response to Exercise          Blood Pressure (Admit)  118/70    Blood Pressure (Exercise)  124/60    Blood Pressure (Exit)  122/78    Heart Rate (Admit)  66 bpm    Heart Rate (Exercise)  96 bpm    Heart Rate (Exit)  69 bpm    Rating of Perceived Exertion (Exercise)  11    Comments  Pt first day of exercise.     Duration  Progress to 30 minutes of  aerobic without signs/symptoms of physical distress    Intensity  THRR unchanged        Progression          Progression  Continue to progress workloads to maintain intensity without signs/symptoms of physical distress.    Average METs  2.6        Resistance Training          Training Prescription  No        Recumbant Bike          Level  2    Minutes  10    METs  2.5        NuStep          Level  2    SPM  85    Minutes  10    METs  2.3        Track          Laps  12    Minutes  10    METs  3.07           Social History   Tobacco Use  Smoking Status Former Smoker  . Packs/day: 0.50  . Years: 60.00  .  Pack years: 30.00  Smokeless Tobacco Never Used    Goals Met:  Exercise tolerated well  Goals Unmet:  Not Applicable  Comments: Pt started cardiac rehab today.  Pt tolerated light exercise without difficulty. VSS, telemetry-sinus rhythm, asymptomatic.  Medication list reconciled. Pt denies barriers to medicaiton compliance.  PSYCHOSOCIAL ASSESSMENT:  PHQ-0.  Pt exhibits positive coping skills, hopeful outlook with supportive family. No psychosocial needs identified at this time, no psychosocial interventions necessary.   Pt oriented to exercise equipment and routine.    Understanding verbalized.    Dr. Fransico Him is Medical Director for Cardiac Rehab at [facility].

## 2018-09-09 ENCOUNTER — Encounter (HOSPITAL_COMMUNITY)
Admission: RE | Admit: 2018-09-09 | Discharge: 2018-09-09 | Disposition: A | Discharge status: Home / Self Care | Payer: Medicare Other | Source: Ambulatory Visit | Attending: Cardiovascular Disease | Admitting: Cardiovascular Disease

## 2018-09-09 ENCOUNTER — Encounter (HOSPITAL_COMMUNITY): Payer: Medicare Other

## 2018-09-09 DIAGNOSIS — Z955 Presence of coronary angioplasty implant and graft: Secondary | ICD-10-CM

## 2018-09-09 DIAGNOSIS — I214 Non-ST elevation (NSTEMI) myocardial infarction: Secondary | ICD-10-CM

## 2018-09-09 LAB — GLUCOSE, CAPILLARY: Glucose-Capillary: 86 mg/dL (ref 70–99)

## 2018-09-12 ENCOUNTER — Encounter (HOSPITAL_COMMUNITY)
Admission: RE | Admit: 2018-09-12 | Discharge: 2018-09-12 | Disposition: A | Discharge status: Home / Self Care | Payer: Medicare Other | Source: Ambulatory Visit | Attending: Cardiovascular Disease | Admitting: Cardiovascular Disease

## 2018-09-12 ENCOUNTER — Encounter (HOSPITAL_COMMUNITY): Payer: Medicare Other

## 2018-09-12 DIAGNOSIS — Z955 Presence of coronary angioplasty implant and graft: Secondary | ICD-10-CM | POA: Diagnosis not present

## 2018-09-12 DIAGNOSIS — I214 Non-ST elevation (NSTEMI) myocardial infarction: Secondary | ICD-10-CM

## 2018-09-12 NOTE — Progress Notes (Signed)
[name] 84 y.o. female Nutrition Note Spoke with pt. Nutrition Plan and Nutrition Survey goals reviewed with pt. Pt is following a Heart Healthy diet. Pt wants to maintain wt, pt is at a healthy weight for her age. Heart healthy diabetic eating tips from orientation reviewed (label reading, how to build a healthy plate, portion sizes, eating frequently across the day, complex vs simple carbs). Pt continues to do a good job of balancing eating heart healthy diabetic diet. Pt shared she does occasionally get bored with meals, distributed new recipes to try. Pt shared she meal preps and plans with her family, additionally gave patient meal prep recipes to try. Pt has Type 2 Diabetes. Last A1c indicates blood glucose well-controlled. Pt checks CBG's 3 times a day. Fasting CBG's reportedly 80-110 mg/dL. Pt expressed understanding of the information reviewed. Pt aware of nutrition education classes offered and would like to attend nutrition classes.  No results found for: HGBA1C  Wt Readings from Last 3 Encounters:  08/30/18 143 lb 15.4 oz (65.3 kg)  06/28/18 140 lb 1.9 oz (63.6 kg)  06/16/18 136 lb 11 oz (62 kg)    Nutrition Diagnosis ? Food-and nutrition-related knowledge deficit related to lack of exposure to information as related to diagnosis of: ? CVD ? Type 2 Diabetes  Nutrition Intervention ? Pt's individual nutrition plan reviewed with pt. ? Benefits of adopting Heart Healthy diet discussed when Medficts reviewed.   ? Pt given handouts for: ? meal prep recipes  Goal(s)  Pt to identify and limit food sources of saturated fat, trans fat, refined carbohydrates and sodium  Pt to try 1 new heart healthy recipe a week.  Plan:   Pt to attend nutrition classes ? Nutrition I ? Nutrition II ? Portion Distortion   Will provide client-centered nutrition education as part of interdisciplinary care  Monitor and evaluate progress toward nutrition goal with team.    Laurina Bustle, MS,  RD, LDN 09/12/2018 9:10 AM

## 2018-09-14 ENCOUNTER — Encounter (HOSPITAL_COMMUNITY)
Admission: RE | Admit: 2018-09-14 | Discharge: 2018-09-14 | Disposition: A | Discharge status: Home / Self Care | Payer: Medicare Other | Source: Ambulatory Visit | Attending: Cardiovascular Disease | Admitting: Cardiovascular Disease

## 2018-09-14 ENCOUNTER — Encounter (HOSPITAL_COMMUNITY): Payer: Medicare Other

## 2018-09-14 DIAGNOSIS — I214 Non-ST elevation (NSTEMI) myocardial infarction: Secondary | ICD-10-CM | POA: Diagnosis not present

## 2018-09-14 DIAGNOSIS — Z955 Presence of coronary angioplasty implant and graft: Secondary | ICD-10-CM | POA: Diagnosis not present

## 2018-09-16 ENCOUNTER — Encounter (HOSPITAL_COMMUNITY)
Admission: RE | Admit: 2018-09-16 | Discharge: 2018-09-16 | Disposition: A | Discharge status: Home / Self Care | Payer: Medicare Other | Source: Ambulatory Visit | Attending: Cardiovascular Disease | Admitting: Cardiovascular Disease

## 2018-09-16 ENCOUNTER — Encounter (HOSPITAL_COMMUNITY): Payer: Medicare Other

## 2018-09-16 DIAGNOSIS — I214 Non-ST elevation (NSTEMI) myocardial infarction: Secondary | ICD-10-CM | POA: Diagnosis not present

## 2018-09-16 DIAGNOSIS — Z955 Presence of coronary angioplasty implant and graft: Secondary | ICD-10-CM

## 2018-09-19 ENCOUNTER — Encounter (HOSPITAL_COMMUNITY)
Admission: RE | Admit: 2018-09-19 | Discharge: 2018-09-19 | Disposition: A | Discharge status: Home / Self Care | Payer: Medicare Other | Source: Ambulatory Visit | Attending: Cardiovascular Disease | Admitting: Cardiovascular Disease

## 2018-09-19 ENCOUNTER — Encounter (HOSPITAL_COMMUNITY): Payer: Medicare Other

## 2018-09-19 DIAGNOSIS — I214 Non-ST elevation (NSTEMI) myocardial infarction: Secondary | ICD-10-CM | POA: Diagnosis not present

## 2018-09-19 DIAGNOSIS — Z955 Presence of coronary angioplasty implant and graft: Secondary | ICD-10-CM

## 2018-09-21 ENCOUNTER — Encounter (HOSPITAL_COMMUNITY): Payer: Medicare Other

## 2018-09-21 ENCOUNTER — Encounter (HOSPITAL_COMMUNITY)
Admission: RE | Admit: 2018-09-21 | Discharge: 2018-09-21 | Disposition: A | Discharge status: Home / Self Care | Payer: Medicare Other | Source: Ambulatory Visit | Attending: Cardiovascular Disease | Admitting: Cardiovascular Disease

## 2018-09-21 DIAGNOSIS — Z955 Presence of coronary angioplasty implant and graft: Secondary | ICD-10-CM | POA: Diagnosis not present

## 2018-09-21 DIAGNOSIS — I214 Non-ST elevation (NSTEMI) myocardial infarction: Secondary | ICD-10-CM

## 2018-09-23 ENCOUNTER — Encounter (HOSPITAL_COMMUNITY)
Admission: RE | Admit: 2018-09-23 | Discharge: 2018-09-23 | Disposition: A | Discharge status: Home / Self Care | Payer: Medicare Other | Source: Ambulatory Visit | Attending: Cardiovascular Disease | Admitting: Cardiovascular Disease

## 2018-09-23 ENCOUNTER — Encounter (HOSPITAL_COMMUNITY): Payer: Medicare Other

## 2018-09-23 DIAGNOSIS — I214 Non-ST elevation (NSTEMI) myocardial infarction: Secondary | ICD-10-CM

## 2018-09-23 DIAGNOSIS — Z955 Presence of coronary angioplasty implant and graft: Secondary | ICD-10-CM

## 2018-09-26 ENCOUNTER — Encounter (HOSPITAL_COMMUNITY)
Admission: RE | Admit: 2018-09-26 | Discharge: 2018-09-26 | Disposition: A | Discharge status: Home / Self Care | Payer: Medicare Other | Source: Ambulatory Visit | Attending: Cardiovascular Disease | Admitting: Cardiovascular Disease

## 2018-09-26 ENCOUNTER — Encounter (HOSPITAL_COMMUNITY): Payer: Medicare Other

## 2018-09-26 DIAGNOSIS — I214 Non-ST elevation (NSTEMI) myocardial infarction: Secondary | ICD-10-CM | POA: Insufficient documentation

## 2018-09-26 DIAGNOSIS — Z955 Presence of coronary angioplasty implant and graft: Secondary | ICD-10-CM

## 2018-09-27 DIAGNOSIS — I2510 Atherosclerotic heart disease of native coronary artery without angina pectoris: Secondary | ICD-10-CM | POA: Insufficient documentation

## 2018-09-27 NOTE — Progress Notes (Signed)
Cardiology Office Note:    Date:  09/28/2018   ID:  [name], DOB Feb 14, 1935, MRN [id number]  PCP:  Lucianne Lei, MD  Cardiologist:  Sherren Mocha, MD   Electrophysiologist:  None   Referring MD: Lucianne Lei, MD   Chief Complaint  Patient presents with  . Follow-up    CAD     History of Present Illness:    [name] is a 84 y.o. female with coronary artery disease status post non-ST elevation myocardial infarction in October 2019 treated with a drug-eluting stent to the proximal RCA, diabetes, hypertension, lymphoproliferative disorder.  Her ejection fraction was preserved at the time of her myocardial infarction.  She was last seen in clinic in November 2019.  [name] returns for follow-up.  She is doing well without chest discomfort, syncope, leg swelling or significant shortness of breath.  She has been going to cardiac rehab since mid January.  She is tolerating exercise well.  She denies any bleeding issues.  She is tolerating her medications well.  Prior CV studies:   The following studies were reviewed today:  Echo 06/16/18 Mild focal basal septal hypertrophy, EF 60-65, inf-lat, inf and inf-sept HK, trivial MR, trivial TR  Cardiac Catheterization 06/15/18 LM normal LAD irregs LCx ost 30 RCA prox 99 PCI:  2.5 x 18 mm Sierra DES to RCA  Past Medical History:  Diagnosis Date  . Arthritis   . Asthma   . CAD (coronary artery disease)    s/p NSTEMI 10/19 - LHC:  pRCA 99, oLCx 30 >> DES to RCA; Echo 10/19: Mild focal basal septal hypertrophy, EF 60-65, inf-lat, inf and inf-sept HK, trivial MR, trivial TR  . Diabetes mellitus without complication ([place])   . Hypertension    Surgical Hx: The patient  has a past surgical history that includes Abdominal hysterectomy ("many yrs ago"); Axillary lymph node biopsy (Right, 02/26/2015); Cholecystectomy; LEFT HEART CATH AND CORONARY ANGIOGRAPHY (N/A, 06/15/2018); and CORONARY STENT INTERVENTION (N/A, 06/15/2018).   Current  Medications: Current Meds  Medication Sig  . ACCU-CHEK FASTCLIX LANCETS MISC USE AS DIRECTED FOR DAILY TESTING  . ACCU-CHEK GUIDE test strip daily. use as directed  . amLODipine-olmesartan (AZOR) 5-20 MG per tablet Take 1 tablet by mouth every morning.   Marland Kitchen aspirin EC 81 MG tablet Take 81 mg by mouth daily.  . Cholecalciferol (VITAMIN D3) 50000 units CAPS Take 50,000 Units by mouth once a week. On Friday  . clopidogrel (PLAVIX) 75 MG tablet Take 1 tablet (75 mg total) by mouth daily with breakfast.  . glipiZIDE (GLUCOTROL) 10 MG tablet Take 10 mg by mouth 2 (two) times daily.  . insulin glargine (LANTUS) 100 unit/mL SOPN Inject 18 Units into the skin at bedtime.   Marland Kitchen levothyroxine (SYNTHROID, LEVOTHROID) 50 MCG tablet Take 1 tablet by mouth daily.  . metFORMIN (GLUCOPHAGE) 500 MG tablet Take 1,000 mg by mouth at bedtime.   . metoprolol succinate (TOPROL XL) 25 MG 24 hr tablet Take 1 tablet (25 mg total) by mouth daily.  . nitroGLYCERIN (NITROSTAT) 0.4 MG SL tablet Place 1 tablet (0.4 mg total) under the tongue every 5 (five) minutes x 3 doses as needed for chest pain.  Marland Kitchen RESTASIS MULTIDOSE 0.05 % ophthalmic emulsion Apply 1 drop to eye 2 (two) times daily.   . rosuvastatin (CRESTOR) 20 MG tablet Take 1 tablet (20 mg total) by mouth daily.  . sitaGLIPtin (JANUVIA) 100 MG tablet Take 100 mg by mouth every morning.   Marland Kitchen  Umeclidinium-Vilanterol (ANORO ELLIPTA) 62.5-25 MCG/INH AEPB Inhale 1 puff into the lungs daily.      Allergies:   Patient has no known allergies.   Social History   Tobacco Use  . Smoking status: Former Smoker    Packs/day: 0.50    Years: 60.00    Pack years: 30.00  . Smokeless tobacco: Never Used  Substance Use Topics  . Alcohol use: No  . Drug use: No     Family Hx: The patient's family history includes Diabetes in her brother, father, mother, and sister; Heart disease in her mother and sister.  ROS:   Please see the history of present illness.    ROS All other  systems reviewed and are negative.   EKGs/Labs/Other Test Reviewed:    EKG:  EKG is not ordered today.    Recent Labs: 06/16/2018: BUN 8; Creatinine, Ser 1.03; Hemoglobin 12.9; Platelets 255; Potassium 3.7; Sodium 142 08/10/2018: ALT 42   Recent Lipid Panel Lab Results  Component Value Date/Time   CHOL 109 07/12/2018 08:28 AM   TRIG 143 07/12/2018 08:28 AM   HDL 50 07/12/2018 08:28 AM   CHOLHDL 2.2 07/12/2018 08:28 AM   CHOLHDL 2.6 06/15/2018 05:42 AM   LDLCALC 30 07/12/2018 08:28 AM    Physical Exam:    VS:  BP 140/76   Pulse 69   Ht 5' 3.25" (1.607 m)   Wt 146 lb 12.8 oz (66.6 kg)   SpO2 97%   BMI 25.80 kg/m     Wt Readings from Last 3 Encounters:  09/28/18 146 lb 12.8 oz (66.6 kg)  08/30/18 143 lb 15.4 oz (65.3 kg)  06/28/18 140 lb 1.9 oz (63.6 kg)     Physical Exam  Constitutional: She is oriented to person, place, and time. She appears well-developed and well-nourished. No distress.  HENT:  Head: Normocephalic and atraumatic.  Neck: Neck supple. No thyromegaly present.  Cardiovascular: Normal rate, regular rhythm, S1 normal, S2 normal and normal heart sounds.  No murmur heard. Pulmonary/Chest: Effort normal. She has no rales.  Abdominal: Soft.  Musculoskeletal:        General: No edema.  Neurological: She is alert and oriented to person, place, and time.  Skin: Skin is warm and dry.    ASSESSMENT & PLAN:    Coronary artery disease involving native coronary artery of native heart without angina pectoris Status post non-ST elevation myocardial infarction in October 2019 treated with a drug-eluting stent to the RCA.  She is doing well without anginal symptoms.  She is attending cardiac rehabilitation.  She is concerned about the cost.  I have asked her to call her Medicare HMO.  If the cost is prohibitive and she has to stop, she should pursue regular walking.  She does not know what to take for arthritis pain.  I have explained to her that Acetaminophen is  ok to take.  Continue aspirin, clopidogrel, beta-blocker, statin.  Follow-up in October with Dr. Burt Knack.    Essential hypertension The patient's blood pressure is controlled on her current regimen.  Continue current therapy.   Hyperlipidemia, unspecified hyperlipidemia type Crestor recently decreased secondary to mildly elevated LFTs.  Repeat lipids and LFTs pending next month.   Dispo:  Return in about 8 months (around 05/29/2019) for Routine Follow Up, w/ Dr. Burt Knack.   Medication Adjustments/Labs and Tests Ordered: Current medicines are reviewed at length with the patient today.  Concerns regarding medicines are outlined above.  Tests Ordered: No orders of the defined  types were placed in this encounter.  Medication Changes: No orders of the defined types were placed in this encounter.   Signed, Richardson Dopp, PA-C  09/28/2018 8:46 AM    [facility] [address], [place], [place]  [address] Phone: [phone]; Fax: [phone]

## 2018-09-28 ENCOUNTER — Encounter: Payer: Self-pay | Admitting: Physician Assistant

## 2018-09-28 ENCOUNTER — Encounter (HOSPITAL_COMMUNITY): Payer: Medicare Other

## 2018-09-28 ENCOUNTER — Ambulatory Visit (INDEPENDENT_AMBULATORY_CARE_PROVIDER_SITE_OTHER): Payer: Medicare Other | Admitting: Physician Assistant

## 2018-09-28 VITALS — BP 140/76 | HR 69 | Ht 63.25 in | Wt 146.8 lb

## 2018-09-28 DIAGNOSIS — I2510 Atherosclerotic heart disease of native coronary artery without angina pectoris: Secondary | ICD-10-CM | POA: Diagnosis not present

## 2018-09-28 DIAGNOSIS — I10 Essential (primary) hypertension: Secondary | ICD-10-CM

## 2018-09-28 DIAGNOSIS — E785 Hyperlipidemia, unspecified: Secondary | ICD-10-CM

## 2018-09-28 MED ORDER — ACETAMINOPHEN ER 650 MG PO TBCR: 650.0000 mg | EXTENDED_RELEASE_TABLET | Freq: Three times a day (TID) | ORAL | Status: DC | PRN

## 2018-09-28 NOTE — Patient Instructions (Addendum)
Medication Instructions:  Your physician recommends that you continue on your current medications as directed. Please refer to the Current Medication list given to you today.   * Tylenol 650 mg every 6-8 hours as needed for pain*  If you need a refill on your cardiac medications before your next appointment, please call your pharmacy.   Lab work: NONE  If you have labs (blood work) drawn today and your tests are completely normal, you will receive your results only by: Marland Kitchen MyChart Message (if you have MyChart) OR . A paper copy in the mail If you have any lab test that is abnormal or we need to change your treatment, we will call you to review the results.  Testing/Procedures: NONE  Follow-Up: At [facility], you and your health needs are our priority.  As part of our continuing mission to provide you with exceptional heart care, we have created designated Provider Care Teams.  These Care Teams include your primary Cardiologist (physician) and Advanced Practice Providers (APPs -  Physician Assistants and Nurse Practitioners) who all work together to provide you with the care you need, when you need it. . You will need a follow up appointment in:  October 2020.  Please call our office 2 months in advance to schedule this appointment.  You may see Sherren Mocha, MD or one of the following Advanced Practice Providers on your designated Care Team: . Richardson Dopp, PA-C . Vin Bhagat, PA-C . Daune Perch, NP  Any Other Special Instructions Will Be Listed Below (If Applicable).

## 2018-09-28 NOTE — Addendum Note (Signed)
Addended by: Jacinta Shoe on: 09/28/2018 09:20 AM   Modules accepted: Orders

## 2018-09-29 DIAGNOSIS — I739 Peripheral vascular disease, unspecified: Secondary | ICD-10-CM | POA: Diagnosis not present

## 2018-09-29 DIAGNOSIS — L603 Nail dystrophy: Secondary | ICD-10-CM | POA: Diagnosis not present

## 2018-09-29 DIAGNOSIS — E1151 Type 2 diabetes mellitus with diabetic peripheral angiopathy without gangrene: Secondary | ICD-10-CM | POA: Diagnosis not present

## 2018-09-29 NOTE — Progress Notes (Signed)
Cardiac Individual Treatment Plan  Patient Details  Name: [name] MRN: [id number] Date of Birth: [birth year] Referring Provider:   Flowsheet Row CARDIAC REHAB PHASE II ORIENTATION from 08/30/2018 in [place]  Referring Provider  Sherren Mocha, MD      Initial Encounter Date:  Flowsheet Row CARDIAC REHAB PHASE II ORIENTATION from 08/30/2018 in [place]  Date  08/30/18      Visit Diagnosis: 06/14/18 NSTEMI  06/15/18 DES RCA  Patient's Home Medications on Admission:  Current Outpatient Medications:  .  ACCU-CHEK FASTCLIX LANCETS MISC, USE AS DIRECTED FOR DAILY TESTING, Disp: , Rfl:  .  ACCU-CHEK GUIDE test strip, daily. use as directed, Disp: , Rfl:  .  acetaminophen (TYLENOL 8 HOUR) 650 MG CR tablet, Take 1 tablet (650 mg total) by mouth every 8 (eight) hours as needed for pain., Disp: , Rfl:  .  amLODipine-olmesartan (AZOR) 5-20 MG per tablet, Take 1 tablet by mouth every morning. , Disp: , Rfl:  .  aspirin EC 81 MG tablet, Take 81 mg by mouth daily., Disp: , Rfl:  .  Cholecalciferol (VITAMIN D3) 50000 units CAPS, Take 50,000 Units by mouth once a week. On Friday, Disp: , Rfl: 1 .  clopidogrel (PLAVIX) 75 MG tablet, Take 1 tablet (75 mg total) by mouth daily with breakfast., Disp: 90 tablet, Rfl: 3 .  glipiZIDE (GLUCOTROL) 10 MG tablet, Take 10 mg by mouth 2 (two) times daily., Disp: , Rfl:  .  insulin glargine (LANTUS) 100 unit/mL SOPN, Inject 18 Units into the skin at bedtime. , Disp: , Rfl:  .  levothyroxine (SYNTHROID, LEVOTHROID) 50 MCG tablet, Take 1 tablet by mouth daily., Disp: , Rfl:  .  metFORMIN (GLUCOPHAGE) 500 MG tablet, Take 1,000 mg by mouth at bedtime. , Disp: , Rfl:  .  metoprolol succinate (TOPROL XL) 25 MG 24 hr tablet, Take 1 tablet (25 mg total) by mouth daily., Disp: 90 tablet, Rfl: 3 .  nitroGLYCERIN (NITROSTAT) 0.4 MG SL tablet, Place 1 tablet (0.4 mg total) under the tongue every 5 (five)  minutes x 3 doses as needed for chest pain., Disp: 25 tablet, Rfl: 1 .  RESTASIS MULTIDOSE 0.05 % ophthalmic emulsion, Apply 1 drop to eye 2 (two) times daily. , Disp: , Rfl:  .  rosuvastatin (CRESTOR) 20 MG tablet, Take 1 tablet (20 mg total) by mouth daily., Disp: 90 tablet, Rfl: 3 .  sitaGLIPtin (JANUVIA) 100 MG tablet, Take 100 mg by mouth every morning. , Disp: , Rfl:  .  Umeclidinium-Vilanterol (ANORO ELLIPTA) 62.5-25 MCG/INH AEPB, Inhale 1 puff into the lungs daily. , Disp: , Rfl:   Past Medical History: Past Medical History:  Diagnosis Date  . Arthritis   . Asthma   . CAD (coronary artery disease)    s/p NSTEMI 10/19 - [facility]:  pRCA 99, oLCx 30 >> DES to RCA; Echo 10/19: Mild focal basal septal hypertrophy, EF 60-65, inf-lat, inf and inf-sept HK, trivial MR, trivial TR  . Diabetes mellitus without complication ([place])   . Hypertension     Tobacco Use: Social History   Tobacco Use  Smoking Status Former Smoker  . Packs/day: 0.50  . Years: 60.00  . Pack years: 30.00  Smokeless Tobacco Never Used    Labs: Recent Review Flowsheet Data    Labs for ITP Cardiac and Pulmonary Rehab Latest Ref Rng & Units 06/15/2018 07/12/2018   Cholestrol 100 - 199 mg/dL 108 109  LDLCALC 0 - 99 mg/dL 38 30   HDL >39 mg/dL 41 50   Trlycerides 0 - 149 mg/dL 146 143      Capillary Blood Glucose: Lab Results  Component Value Date   GLUCAP 86 09/09/2018   GLUCAP 162 (H) 09/07/2018   GLUCAP 228 (H) 09/07/2018   GLUCAP 199 (H) 06/16/2018   GLUCAP 138 (H) 06/16/2018     Exercise Target Goals: Exercise Program Goal: Individual exercise prescription set using results from initial 6 min walk test and THRR while considering  patient's activity barriers and safety.   Exercise Prescription Goal: Initial exercise prescription builds to 30-45 minutes a day of aerobic activity, 2-3 days per week.  Home exercise guidelines will be given to patient during program as part of exercise prescription that  the participant will acknowledge.  Activity Barriers & Risk Stratification: Activity Barriers & Cardiac Risk Stratification - 08/30/18 0840    Activity Barriers & Cardiac Risk Stratification          Activity Barriers  Arthritis;Other (comment)    Comments  Vertigo    Cardiac Risk Stratification  High           6 Minute Walk: 6 Minute Walk    6 Minute Walk    Row Name 08/30/18 0933   Phase  Initial   Distance  1210 feet   Walk Time  6 minutes   # of Rest Breaks  1 38 seconds   MPH  2.29   METS  2.02   RPE  11   Perceived Dyspnea   0   VO2 Peak  7.08   Symptoms  Yes (comment)   Comments  Patient c/o dizziness.   Resting HR  69 bpm   Resting BP  124/80   Resting Oxygen Saturation   94 %   Exercise Oxygen Saturation  during 6 min walk  97 %   Max Ex. HR  88 bpm   Max Ex. BP  124/60   2 Minute Post BP  140/80          Oxygen Initial Assessment:   Oxygen Re-Evaluation:   Oxygen Discharge (Final Oxygen Re-Evaluation):   Initial Exercise Prescription: Initial Exercise Prescription - 08/30/18 1100    Date of Initial Exercise RX and Referring Provider          Date  08/30/18    Referring Provider  Sherren Mocha, MD    Expected Discharge Date  11/25/18        Recumbant Bike          Level  2    Minutes  10    METs  2        NuStep          Level  2    SPM  85    Minutes  10    METs  2        Track          Laps  8    Minutes  10    METs  2.39        Prescription Details          Frequency (times per week)  3    Duration  Progress to 30 minutes of continuous aerobic without signs/symptoms of physical distress        Intensity          THRR 40-80% of Max Heartrate  56-111    Ratings of Perceived Exertion  11-13  Perceived Dyspnea  0-4        Progression          Progression  Continue to progress workloads to maintain intensity without signs/symptoms of physical distress.        Resistance Training          Training  Prescription  Yes    Weight  2lbs    Reps  10-15           Perform Capillary Blood Glucose checks as needed.  Exercise Prescription Changes: Exercise Prescription Changes    Response to Exercise    Row Name 09/07/18 0900 09/14/18 1000 09/26/18 0900   Blood Pressure (Admit)  118/70  no documentation  122/72   Blood Pressure (Exercise)  124/60  no documentation  130/72   Blood Pressure (Exit)  122/78  no documentation  130/64   Heart Rate (Admit)  66 bpm  no documentation  77 bpm   Heart Rate (Exercise)  96 bpm  no documentation  103 bpm   Heart Rate (Exit)  69 bpm  no documentation  80 bpm   Rating of Perceived Exertion (Exercise)  11  no documentation  11   Comments  Pt first day of exercise.   no documentation  no documentation   Duration  Progress to 30 minutes of  aerobic without signs/symptoms of physical distress  no documentation  Progress to 30 minutes of  aerobic without signs/symptoms of physical distress   Intensity  THRR unchanged  no documentation  THRR unchanged       Progression    Row Name 09/07/18 0900 09/14/18 1000 09/26/18 0900   Progression  Continue to progress workloads to maintain intensity without signs/symptoms of physical distress.  no documentation  Continue to progress workloads to maintain intensity without signs/symptoms of physical distress.   Average METs  2.6  no documentation  3.2       Resistance Training    Row Name 09/07/18 0900 09/14/18 1000 09/26/18 0900   Training Prescription  No  no documentation  Yes   Weight  no documentation  no documentation  2 lbs.    Reps  no documentation  no documentation  10-15   Time  no documentation  no documentation  [place] Name 09/07/18 0900 09/14/18 1000 09/26/18 0900   Level  2  no documentation  2   Minutes  10  no documentation  10   METs  2.5  no documentation  4       NuStep    Row Name 09/07/18 0900 09/14/18 1000 09/26/18 0900   Level  2  no documentation  2    SPM  85  no documentation  85   Minutes  10  no documentation  10   METs  2.3  no documentation  3.5       Track    Row Name 09/07/18 0900 09/14/18 1000 09/26/18 0900   Laps  12  no documentation  16   Minutes  10  no documentation  10   METs  3.07  no documentation  3.76       Home Exercise Plan    [place] Name 09/07/18 0900 09/14/18 1000 09/26/18 0900   Plans to continue exercise at  no documentation  Home (comment)  no documentation   Frequency  no documentation  Add 4 additional days to program exercise sessions.  no documentation  Initial Home Exercises Provided  no documentation  09/14/18  no documentation          Exercise Comments: Exercise Comments    Row Name 09/07/18 0940 09/14/18 1036 09/28/18 1029   Exercise Comments  Pt first day of exercise. Tolerated exercise well.   Reviewed HEP with Pt. Pt was responsive and is exercising at home in addition to cardiac rehab.   Reviewed METs and goals with Pt. Pt is progressing well.       Exercise Goals and Review: Exercise Goals    Exercise Goals    Row Name 08/30/18 0842   Increase Physical Activity  Yes   Intervention  Provide advice, education, support and counseling about physical activity/exercise needs.;Develop an individualized exercise prescription for aerobic and resistive training based on initial evaluation findings, risk stratification, comorbidities and participant's personal goals.   Expected Outcomes  Short Term: Attend rehab on a regular basis to increase amount of physical activity.;Long Term: Add in home exercise to make exercise part of routine and to increase amount of physical activity.;Long Term: Exercising regularly at least 3-5 days a week.   Increase Strength and Stamina  Yes   Intervention  Provide advice, education, support and counseling about physical activity/exercise needs.;Develop an individualized exercise prescription for aerobic and resistive training based on initial evaluation findings,  risk stratification, comorbidities and participant's personal goals.   Expected Outcomes  Short Term: Increase workloads from initial exercise prescription for resistance, speed, and METs.;Short Term: Perform resistance training exercises routinely during rehab and add in resistance training at home;Long Term: Improve cardiorespiratory fitness, muscular endurance and strength as measured by increased METs and functional capacity (6MWT)   Able to understand and use rate of perceived exertion (RPE) scale  Yes   Intervention  Provide education and explanation on how to use RPE scale   Expected Outcomes  Short Term: Able to use RPE daily in rehab to express subjective intensity level;Long Term:  Able to use RPE to guide intensity level when exercising independently   Knowledge and understanding of Target Heart Rate Range (THRR)  Yes   Intervention  Provide education and explanation of THRR including how the numbers were predicted and where they are located for reference   Expected Outcomes  Short Term: Able to state/look up THRR;Long Term: Able to use THRR to govern intensity when exercising independently;Short Term: Able to use daily as guideline for intensity in rehab   Able to check pulse independently  Yes   Intervention  Provide education and demonstration on how to check pulse in carotid and radial arteries.;Review the importance of being able to check your own pulse for safety during independent exercise   Expected Outcomes  Short Term: Able to explain why pulse checking is important during independent exercise;Long Term: Able to check pulse independently and accurately   Understanding of Exercise Prescription  Yes   Intervention  Provide education, explanation, and written materials on patient's individual exercise prescription   Expected Outcomes  Short Term: Able to explain program exercise prescription;Long Term: Able to explain home exercise prescription to exercise independently           Exercise Goals Re-Evaluation : Exercise Goals Re-Evaluation    Exercise Goal Re-Evaluation    Row Name 09/07/18 0960 09/14/18 1034 09/28/18 1027   Exercise Goals Review  Increase Physical Activity;Increase Strength and Stamina;Able to understand and use rate of perceived exertion (RPE) scale;Knowledge and understanding of Target Heart Rate Range (THRR);Understanding of Exercise Prescription;Able to check  pulse independently  Increase Physical Activity;Increase Strength and Stamina;Able to understand and use rate of perceived exertion (RPE) scale;Knowledge and understanding of Target Heart Rate Range (THRR);Understanding of Exercise Prescription;Able to check pulse independently  Increase Physical Activity;Increase Strength and Stamina;Able to understand and use rate of perceived exertion (RPE) scale;Knowledge and understanding of Target Heart Rate Range (THRR);Understanding of Exercise Prescription;Able to check pulse independently   Comments  Pt first day of exercise. Tolerated exercise Rx well.  Reviewed HEP with Pt. Pt was responsive and understands goals. Pt is walking 3-4 days at home for 30-45 minutes.   Reviewed METs and goals with Pt. Pt has a MET level of 3.2. Pt is tolerating exercise Rx well and continues to progress with each session. Pt is walking 7 days per week for 30-45 minutes in addition to the program.    Expected Outcomes  Will continue to monitor and progress Pt as tolerated.   Will continue to monitor and progress Pt as tolerated.   Will continue to monitor and progress Pt as tolerated.           Discharge Exercise Prescription (Final Exercise Prescription Changes): Exercise Prescription Changes - 09/26/18 0900    Response to Exercise          Blood Pressure (Admit)  122/72    Blood Pressure (Exercise)  130/72    Blood Pressure (Exit)  130/64    Heart Rate (Admit)  77 bpm    Heart Rate (Exercise)  103 bpm    Heart Rate (Exit)  80 bpm    Rating of Perceived Exertion  (Exercise)  11    Duration  Progress to 30 minutes of  aerobic without signs/symptoms of physical distress    Intensity  THRR unchanged        Progression          Progression  Continue to progress workloads to maintain intensity without signs/symptoms of physical distress.    Average METs  3.2        Resistance Training          Training Prescription  Yes    Weight  2 lbs.     Reps  10-15    Time  10 Minutes        Recumbant Bike          Level  2    Minutes  10    METs  4        NuStep          Level  2    SPM  85    Minutes  10    METs  3.5        Track          Laps  16    Minutes  10    METs  3.76           Nutrition:  Target Goals: Understanding of nutrition guidelines, daily intake of sodium '1500mg'$ , cholesterol '200mg'$ , calories 30% from fat and 7% or less from saturated fats, daily to have 5 or more servings of fruits and vegetables.  Biometrics: Pre Biometrics - 08/30/18 0830    Pre Biometrics          Height  5' 3.25" (1.607 m)    Weight  65.3 kg    Waist Circumference  34.5 inches    Hip Circumference  37 inches    Waist to Hip Ratio  0.93 %    BMI (Calculated)  25.29  Triceps Skinfold  17 mm    % Body Fat  36 %    Grip Strength  27.5 kg    Flexibility  9.75 in    Single Leg Stand  9.1 seconds            Nutrition Therapy Plan and Nutrition Goals: Nutrition Therapy & Goals - 08/30/18 1544    Nutrition Therapy          Diet  heart healthy, carb modified        Personal Nutrition Goals          Nutrition Goal  Pt to identify and limit food sources of saturated fat, trans fat, refined carbohydrates and sodium    Personal Goal #2  Pt to try 1 new heart healthy recipe a week.        Intervention Plan          Intervention  Prescribe, educate and counsel regarding individualized specific dietary modifications aiming towards targeted core components such as weight, hypertension, lipid management, diabetes, heart failure and  other comorbidities.    Expected Outcomes  Short Term Goal: Understand basic principles of dietary content, such as calories, fat, sodium, cholesterol and nutrients.;Long Term Goal: Adherence to prescribed nutrition plan.           Nutrition Assessments: Nutrition Assessments - 08/30/18 1545    MEDFICTS Scores          Pre Score  30           Nutrition Goals Re-Evaluation:   Nutrition Goals Re-Evaluation:   Nutrition Goals Discharge (Final Nutrition Goals Re-Evaluation):   Psychosocial: Target Goals: Acknowledge presence or absence of significant depression and/or stress, maximize coping skills, provide positive support system. Participant is able to verbalize types and ability to use techniques and skills needed for reducing stress and depression.  Initial Review & Psychosocial Screening: Initial Psych Review & Screening - 08/30/18 1155    Initial Review          Current issues with  None Identified        Family Dynamics          Good Support System?  Yes   Daughter-Cathy Jacqualin Combes        Barriers          Psychosocial barriers to participate in program  There are no identifiable barriers or psychosocial needs.        Screening Interventions          Interventions  Encouraged to exercise           Quality of Life Scores: Quality of Life - 08/30/18 1037    Quality of Life Scores          Health/Function Pre  29.6 %    Socioeconomic Pre  30 %    Psych/Spiritual Pre  30 %    Family Pre  30 %    GLOBAL Pre  29.8 %          Scores of 19 and below usually indicate a poorer quality of life in these areas.  A difference of  2-3 points is a clinically meaningful difference.  A difference of 2-3 points in the total score of the Quality of Life Index has been associated with significant improvement in overall quality of life, self-image, physical symptoms, and general health in studies assessing change in quality of life.  PHQ-9: Recent Review Flowsheet Data     Depression screen [facility] 2/9 09/07/2018   Decreased  Interest 0   Down, Depressed, Hopeless 0   PHQ - 2 Score 0     Interpretation of Total Score  Total Score Depression Severity:  1-4 = Minimal depression, 5-9 = Mild depression, 10-14 = Moderate depression, 15-19 = Moderately severe depression, 20-27 = Severe depression   Psychosocial Evaluation and Intervention: Psychosocial Evaluation - 09/07/18 1027    Psychosocial Evaluation & Interventions          Interventions  Encouraged to exercise with the program and follow exercise prescription    Comments  No psychosocial needs identified, no interventions necessary. pt enjoys playing Bingo and walking in her community,    Expected Outcomes  pt will exhibit positive outlook with good coping skills.     Continue Psychosocial Services   No Follow up required           Psychosocial Re-Evaluation: Psychosocial Re-Evaluation    Psychosocial Re-Evaluation    [place] Name 09/23/18 [phone]   Current issues with  None Identified   Comments  No psychosocial needs identified, no interventions necessary. pt enjoys playing Bingo and walking in her community,   Expected Outcomes  pt will exhibit positive outlook with good coping skills.    Interventions  Encouraged to attend Cardiac Rehabilitation for the exercise   Continue Psychosocial Services   No Follow up required          Psychosocial Discharge (Final Psychosocial Re-Evaluation): Psychosocial Re-Evaluation - 09/23/18 0746    Psychosocial Re-Evaluation          Current issues with  None Identified    Comments  No psychosocial needs identified, no interventions necessary. pt enjoys playing Bingo and walking in her community,    Expected Outcomes  pt will exhibit positive outlook with good coping skills.     Interventions  Encouraged to attend Cardiac Rehabilitation for the exercise    Continue Psychosocial Services   No Follow up required           Vocational Rehabilitation: Provide  vocational rehab assistance to qualifying candidates.   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 Participant will state understanding/return demonstration of topics presented.  Learning Barriers/Preferences: Learning Barriers/Preferences - 08/30/18 0843    Learning Barriers/Preferences          Learning Barriers  Sight    Learning Preferences  Written Material           Education Topics: Count Your Pulse:  -Group instruction provided by verbal instruction, demonstration, patient participation and written materials to support subject.  Instructors address importance of being able to find your pulse and how to count your pulse when at home without a heart monitor.  Patients get hands on experience counting their pulse with staff help and individually. Flowsheet Row CARDIAC REHAB PHASE II EXERCISE from 09/16/2018 in [place]  Date  09/16/18  Instruction Review Code  2- Demonstrated Understanding      Heart Attack, Angina, and Risk Factor Modification:  -Group instruction provided by verbal instruction, video, and written materials to support subject.  Instructors address signs and symptoms of angina and heart attacks.    Also discuss risk  factors for heart disease and how to make changes to improve heart health risk factors.   Functional Fitness:  -Group instruction provided by verbal instruction, demonstration, patient participation, and written materials to support subject.  Instructors address safety measures for doing things around the house.  Discuss how to get up and down off the floor, how to pick things up properly, how to safely get out of a chair  without assistance, and balance training. Flowsheet Row CARDIAC REHAB PHASE II EXERCISE from 09/16/2018 in [place]  Date  09/09/18  Instruction Review Code  2- Demonstrated Understanding      Meditation and Mindfulness:  -Group instruction provided by verbal instruction, patient participation, and written materials to support subject.  Instructor addresses importance of mindfulness and meditation practice to help reduce stress and improve awareness.  Instructor also leads participants through a meditation exercise.  Flowsheet Row CARDIAC REHAB PHASE II EXERCISE from 09/16/2018 in [place]  Date  09/07/18  Educator  [place]  Instruction Review Code  2- Demonstrated Understanding      Stretching for Flexibility and Mobility:  -Group instruction provided by verbal instruction, patient participation, and written materials to support subject.  Instructors lead participants through series of stretches that are designed to increase flexibility thus improving mobility.  These stretches are additional exercise for major muscle groups that are typically performed during regular warm up and cool down.   Hands Only CPR:  -Group verbal, video, and participation provides a basic overview of AHA guidelines for community CPR. Role-play of emergencies allow participants the opportunity to practice calling for help and chest compression technique with discussion of AED use.   Hypertension: -Group verbal and written instruction that provides a basic overview of hypertension including the most recent diagnostic guidelines, risk factor reduction with self-care instructions and medication management.    Nutrition I class: Heart Healthy Eating:  -Group instruction provided by PowerPoint slides, verbal discussion, and written materials to support subject matter. The instructor gives an explanation and review of the Therapeutic Lifestyle Changes  diet recommendations, which includes a discussion on lipid goals, dietary fat, sodium, fiber, plant stanol/sterol esters, sugar, and the components of a well-balanced, healthy diet.   Nutrition II class: Lifestyle Skills:  -Group instruction provided by PowerPoint slides, verbal discussion, and written materials to support subject matter. The instructor gives an explanation and review of label reading, grocery shopping for heart health, heart healthy recipe modifications, and ways to make healthier choices when eating out.   Diabetes Question & Answer:  -Group instruction provided by PowerPoint slides, verbal discussion, and written materials to support subject matter. The instructor gives an explanation and review of diabetes co-morbidities, pre- and post-prandial blood glucose goals, pre-exercise blood glucose goals, signs, symptoms, and treatment of hypoglycemia and hyperglycemia, and foot care basics.   Diabetes Blitz:  -Group instruction provided by PowerPoint slides, verbal discussion, and written materials to support subject matter. The instructor gives an explanation and review of the physiology behind type 1 and type 2 diabetes, diabetes medications and rational behind using different medications, pre- and post-prandial blood glucose recommendations and Hemoglobin A1c goals, diabetes diet, and exercise including blood glucose guidelines for exercising safely.    Portion Distortion:  -Group instruction provided by PowerPoint slides, verbal discussion, written materials, and food models to support subject matter. The instructor gives an explanation of serving size versus portion size, changes in portions sizes over the last 20 years, and what  consists of a serving from each food group.   Stress Management:  -Group instruction provided by verbal instruction, video, and written materials to support subject matter.  Instructors review role of stress in heart disease and how to cope with  stress positively.   Flowsheet Row CARDIAC REHAB PHASE II EXERCISE from 09/23/2018 in [place]  Date  09/23/18  Instruction Review Code  2- Demonstrated Understanding      Exercising on Your Own:  -Group instruction provided by verbal instruction, power point, and written materials to support subject.  Instructors discuss benefits of exercise, components of exercise, frequency and intensity of exercise, and end points for exercise.  Also discuss use of nitroglycerin and activating EMS.  Review options of places to exercise outside of rehab.  Review guidelines for sex with heart disease.   Cardiac Drugs I:  -Group instruction provided by verbal instruction and written materials to support subject.  Instructor reviews cardiac drug classes: antiplatelets, anticoagulants, beta blockers, and statins.  Instructor discusses reasons, side effects, and lifestyle considerations for each drug class.   Cardiac Drugs II:  -Group instruction provided by verbal instruction and written materials to support subject.  Instructor reviews cardiac drug classes: angiotensin converting enzyme inhibitors (ACE-I), angiotensin II receptor blockers (ARBs), nitrates, and calcium channel blockers.  Instructor discusses reasons, side effects, and lifestyle considerations for each drug class.   Anatomy and Physiology of the Circulatory System:  Group verbal and written instruction and models provide basic cardiac anatomy and physiology, with the coronary electrical and arterial systems. Review of: AMI, Angina, Valve disease, Heart Failure, Peripheral Artery Disease, Cardiac Arrhythmia, Pacemakers, and the ICD.   Other Education:  -Group or individual verbal, written, or video instructions that support the educational goals of the cardiac rehab program.   Holiday Eating Survival Tips:  -Group instruction provided by PowerPoint slides, verbal discussion, and written materials to support  subject matter. The instructor gives patients tips, tricks, and techniques to help them not only survive but enjoy the holidays despite the onslaught of food that accompanies the holidays.   Knowledge Questionnaire Score: Knowledge Questionnaire Score - 09/09/18 0957    Knowledge Questionnaire Score          Pre Score  17/24           Core Components/Risk Factors/Patient Goals at Admission: Personal Goals and Risk Factors at Admission - 08/30/18 1135    Core Components/Risk Factors/Patient Goals on Admission          Tobacco Cessation  Yes    Number of packs per day  0.5   none since heart attack   Intervention  Offer self-teaching materials, assist with locating and accessing local/national Quit Smoking programs, and support quit date choice.    Expected Outcomes  Short Term: Will quit all tobacco product use, adhering to prevention of relapse plan.;Long Term: Complete abstinence from all tobacco products for at least 12 months from quit date.    Diabetes  Yes    Intervention  Provide education about signs/symptoms and action to take for hypo/hyperglycemia.;Provide education about proper nutrition, including hydration, and aerobic/resistive exercise prescription along with prescribed medications to achieve blood glucose in normal ranges: Fasting glucose 65-99 mg/dL    Expected Outcomes  Short Term: Participant verbalizes understanding of the signs/symptoms and immediate care of hyper/hypoglycemia, proper foot care and importance of medication, aerobic/resistive exercise and nutrition plan for blood glucose control.;Long Term: Attainment of HbA1C < 7%.    Hypertension  Yes    Intervention  Provide education on lifestyle modifcations including regular physical activity/exercise, weight management, moderate sodium restriction and increased consumption of fresh fruit, vegetables, and low fat dairy, alcohol moderation, and smoking cessation.;Monitor prescription use compliance.    Expected  Outcomes  Short Term: Continued assessment and intervention until BP is < 140/25m HG in hypertensive participants. < 130/836mHG in hypertensive participants with diabetes, heart failure or chronic kidney disease.;Long Term: Maintenance of blood pressure at goal levels.    Lipids  Yes    Intervention  Provide education and support for participant on nutrition & aerobic/resistive exercise along with prescribed medications to achieve LDL '70mg'$ , HDL >'40mg'$ .    Expected Outcomes  Short Term: Participant states understanding of desired cholesterol values and is compliant with medications prescribed. Participant is following exercise prescription and nutrition guidelines.;Long Term: Cholesterol controlled with medications as prescribed, with individualized exercise RX and with personalized nutrition plan. Value goals: LDL < '70mg'$ , HDL > 40 mg.           Core Components/Risk Factors/Patient Goals Review:  Goals and Risk Factor Review    Core Components/Risk Factors/Patient Goals Review    Row Name 09/07/18 1028 09/23/18 0746   Personal Goals Review  Tobacco Cessation;Diabetes;Hypertension;Lipids  Tobacco Cessation;Diabetes;Hypertension;Lipids   Review  pt with multiple CAD RF demonstrates eagerness to participate in CR program. pt denies current tobacco use. pt checks home blood sugar. pt personal goals are to resume ability to walk 3 miles 5 day/week.    pt with multiple CAD RF demonstrates eagerness to participate in CR program. pt denies current tobacco use. pt checks home blood sugar. pt personal goals are to resume ability to walk 3 miles 5 day/week.  pt is very pleased to have strength/stamina to make her king size bed without resting and climbing upstairs without difficulty.   Expected Outcomes  pt will participate in CR exercise, nutrition and lifestyle modification opportunities to decrease overall RF and resume usual activities.   pt will participate in CR exercise, nutrition and lifestyle  modification opportunities to decrease overall RF and resume usual activities.           Core Components/Risk Factors/Patient Goals at Discharge (Final Review):  Goals and Risk Factor Review - 09/23/18 0746    Core Components/Risk Factors/Patient Goals Review          Personal Goals Review  Tobacco Cessation;Diabetes;Hypertension;Lipids    Review  pt with multiple CAD RF demonstrates eagerness to participate in CR program. pt denies current tobacco use. pt checks home blood sugar. pt personal goals are to resume ability to walk 3 miles 5 day/week.  pt is very pleased to have strength/stamina to make her king size bed without resting and climbing upstairs without difficulty.    Expected Outcomes  pt will participate in CR exercise, nutrition and lifestyle modification opportunities to decrease overall RF and resume usual activities.            ITP Comments: ITP Comments    Row Name 08/30/18 0839 09/07/18 1025 09/23/18 0745   ITP Comments  Medical Director- Dr. TrFransico HimMD  pt started group exercise. pt tolerated light activity without difficulty. pt oriented to exercise equipment and safety routine. understanding verbalized.   30 day ITP review. pt with good attendance and participation.       Comments:

## 2018-09-30 ENCOUNTER — Encounter (HOSPITAL_COMMUNITY): Payer: Medicare Other

## 2018-09-30 ENCOUNTER — Encounter (HOSPITAL_COMMUNITY)
Admission: RE | Admit: 2018-09-30 | Discharge: 2018-09-30 | Disposition: A | Discharge status: Home / Self Care | Payer: Medicare Other | Source: Ambulatory Visit | Attending: Cardiovascular Disease | Admitting: Cardiovascular Disease

## 2018-09-30 DIAGNOSIS — I214 Non-ST elevation (NSTEMI) myocardial infarction: Secondary | ICD-10-CM

## 2018-09-30 DIAGNOSIS — Z955 Presence of coronary angioplasty implant and graft: Secondary | ICD-10-CM

## 2018-10-03 ENCOUNTER — Encounter (HOSPITAL_COMMUNITY)
Admission: RE | Admit: 2018-10-03 | Discharge: 2018-10-03 | Disposition: A | Discharge status: Home / Self Care | Payer: Medicare Other | Source: Ambulatory Visit | Attending: Cardiovascular Disease | Admitting: Cardiovascular Disease

## 2018-10-03 ENCOUNTER — Encounter (HOSPITAL_COMMUNITY): Payer: Medicare Other

## 2018-10-03 DIAGNOSIS — Z955 Presence of coronary angioplasty implant and graft: Secondary | ICD-10-CM

## 2018-10-03 DIAGNOSIS — I214 Non-ST elevation (NSTEMI) myocardial infarction: Secondary | ICD-10-CM | POA: Diagnosis not present

## 2018-10-03 NOTE — Addendum Note (Signed)
Encounter addended by: Jeralyn Bennett on: 10/03/2018 10:14 AM  Actions taken: Clinical Note Signed

## 2018-10-03 NOTE — Progress Notes (Signed)
I have reviewed a Home Exercise Prescription with [name] . [name] is currently exercising at home.  The patient was advised to walk 5-7 days a week for 30-45 minutes.  [name] and I discussed how to progress their exercise prescription.  The patient stated that their goals were to continue walking 2-4 days in addition to Cardiac Rehab.  The patient stated that they understand the exercise prescription.  We reviewed exercise guidelines, target heart rate during exercise, RPE Scale, weather conditions, NTG use, endpoints for exercise, warmup and cool down.  Patient is encouraged to come to me with any questions. I will continue to follow up with the patient to assist them with progression and safety.    10/03/2018 10:13 AM  [name] BS, ACSM CEP

## 2018-10-05 ENCOUNTER — Encounter (HOSPITAL_COMMUNITY)
Admission: RE | Admit: 2018-10-05 | Discharge: 2018-10-05 | Disposition: A | Discharge status: Home / Self Care | Payer: Medicare Other | Source: Ambulatory Visit | Attending: Cardiovascular Disease | Admitting: Cardiovascular Disease

## 2018-10-05 ENCOUNTER — Encounter (HOSPITAL_COMMUNITY): Payer: Medicare Other

## 2018-10-05 DIAGNOSIS — Z955 Presence of coronary angioplasty implant and graft: Secondary | ICD-10-CM

## 2018-10-05 DIAGNOSIS — I214 Non-ST elevation (NSTEMI) myocardial infarction: Secondary | ICD-10-CM

## 2018-10-07 ENCOUNTER — Encounter (HOSPITAL_COMMUNITY): Payer: Medicare Other

## 2018-10-07 ENCOUNTER — Encounter (HOSPITAL_COMMUNITY)
Admission: RE | Admit: 2018-10-07 | Discharge: 2018-10-07 | Disposition: A | Discharge status: Home / Self Care | Payer: Medicare Other | Source: Ambulatory Visit | Attending: Cardiovascular Disease | Admitting: Cardiovascular Disease

## 2018-10-07 DIAGNOSIS — I214 Non-ST elevation (NSTEMI) myocardial infarction: Secondary | ICD-10-CM | POA: Diagnosis not present

## 2018-10-07 DIAGNOSIS — Z955 Presence of coronary angioplasty implant and graft: Secondary | ICD-10-CM | POA: Diagnosis not present

## 2018-10-10 ENCOUNTER — Encounter (HOSPITAL_COMMUNITY)
Admission: RE | Admit: 2018-10-10 | Discharge: 2018-10-10 | Disposition: A | Discharge status: Home / Self Care | Payer: Medicare Other | Source: Ambulatory Visit | Attending: Cardiovascular Disease | Admitting: Cardiovascular Disease

## 2018-10-10 ENCOUNTER — Encounter (HOSPITAL_COMMUNITY): Payer: Medicare Other

## 2018-10-10 DIAGNOSIS — I214 Non-ST elevation (NSTEMI) myocardial infarction: Secondary | ICD-10-CM

## 2018-10-10 DIAGNOSIS — Z955 Presence of coronary angioplasty implant and graft: Secondary | ICD-10-CM | POA: Diagnosis not present

## 2018-10-12 ENCOUNTER — Encounter (HOSPITAL_COMMUNITY): Payer: Medicare Other

## 2018-10-12 ENCOUNTER — Encounter (HOSPITAL_COMMUNITY)
Admission: RE | Admit: 2018-10-12 | Discharge: 2018-10-12 | Disposition: A | Discharge status: Home / Self Care | Payer: Medicare Other | Source: Ambulatory Visit | Attending: Cardiovascular Disease | Admitting: Cardiovascular Disease

## 2018-10-12 DIAGNOSIS — I214 Non-ST elevation (NSTEMI) myocardial infarction: Secondary | ICD-10-CM | POA: Diagnosis not present

## 2018-10-12 DIAGNOSIS — Z955 Presence of coronary angioplasty implant and graft: Secondary | ICD-10-CM

## 2018-10-14 ENCOUNTER — Encounter (HOSPITAL_COMMUNITY): Payer: Medicare Other

## 2018-10-14 ENCOUNTER — Telehealth (HOSPITAL_COMMUNITY): Payer: Self-pay | Admitting: Family Medicine

## 2018-10-17 ENCOUNTER — Encounter (HOSPITAL_COMMUNITY): Payer: Medicare Other

## 2018-10-17 ENCOUNTER — Encounter (HOSPITAL_COMMUNITY)
Admission: RE | Admit: 2018-10-17 | Discharge: 2018-10-17 | Disposition: A | Discharge status: Home / Self Care | Payer: Medicare Other | Source: Ambulatory Visit | Attending: Cardiovascular Disease | Admitting: Cardiovascular Disease

## 2018-10-17 DIAGNOSIS — Z955 Presence of coronary angioplasty implant and graft: Secondary | ICD-10-CM | POA: Diagnosis not present

## 2018-10-17 DIAGNOSIS — I214 Non-ST elevation (NSTEMI) myocardial infarction: Secondary | ICD-10-CM | POA: Diagnosis not present

## 2018-10-19 ENCOUNTER — Encounter (HOSPITAL_COMMUNITY): Payer: Medicare Other

## 2018-10-19 ENCOUNTER — Encounter (HOSPITAL_COMMUNITY)
Admission: RE | Admit: 2018-10-19 | Discharge: 2018-10-19 | Disposition: A | Discharge status: Home / Self Care | Payer: Medicare Other | Source: Ambulatory Visit | Attending: Cardiovascular Disease | Admitting: Cardiovascular Disease

## 2018-10-19 DIAGNOSIS — I214 Non-ST elevation (NSTEMI) myocardial infarction: Secondary | ICD-10-CM

## 2018-10-19 DIAGNOSIS — Z955 Presence of coronary angioplasty implant and graft: Secondary | ICD-10-CM

## 2018-10-21 ENCOUNTER — Encounter (HOSPITAL_COMMUNITY)
Admission: RE | Admit: 2018-10-21 | Discharge: 2018-10-21 | Disposition: A | Discharge status: Home / Self Care | Payer: Medicare Other | Source: Ambulatory Visit | Attending: Cardiovascular Disease | Admitting: Cardiovascular Disease

## 2018-10-21 ENCOUNTER — Encounter (HOSPITAL_COMMUNITY): Payer: Medicare Other

## 2018-10-21 DIAGNOSIS — Z955 Presence of coronary angioplasty implant and graft: Secondary | ICD-10-CM

## 2018-10-21 DIAGNOSIS — I214 Non-ST elevation (NSTEMI) myocardial infarction: Secondary | ICD-10-CM | POA: Diagnosis not present

## 2018-10-24 ENCOUNTER — Encounter (HOSPITAL_COMMUNITY): Payer: Medicare Other

## 2018-10-24 ENCOUNTER — Encounter (HOSPITAL_COMMUNITY)
Admission: RE | Admit: 2018-10-24 | Discharge: 2018-10-24 | Disposition: A | Discharge status: Home / Self Care | Payer: Medicare Other | Source: Ambulatory Visit | Attending: Cardiovascular Disease | Admitting: Cardiovascular Disease

## 2018-10-24 DIAGNOSIS — I214 Non-ST elevation (NSTEMI) myocardial infarction: Secondary | ICD-10-CM | POA: Insufficient documentation

## 2018-10-24 DIAGNOSIS — Z955 Presence of coronary angioplasty implant and graft: Secondary | ICD-10-CM | POA: Diagnosis not present

## 2018-10-26 ENCOUNTER — Encounter (HOSPITAL_COMMUNITY)
Admission: RE | Admit: 2018-10-26 | Discharge: 2018-10-26 | Disposition: A | Discharge status: Home / Self Care | Payer: Medicare Other | Source: Ambulatory Visit | Attending: Cardiovascular Disease | Admitting: Cardiovascular Disease

## 2018-10-26 ENCOUNTER — Encounter (HOSPITAL_COMMUNITY): Payer: Medicare Other

## 2018-10-26 DIAGNOSIS — I214 Non-ST elevation (NSTEMI) myocardial infarction: Secondary | ICD-10-CM

## 2018-10-26 DIAGNOSIS — Z955 Presence of coronary angioplasty implant and graft: Secondary | ICD-10-CM

## 2018-10-26 NOTE — Progress Notes (Signed)
Cardiac Individual Treatment Plan  Patient Details  Name: [name] MRN: [id number] Date of Birth: [birth year] Referring Provider:   Flowsheet Row CARDIAC REHAB PHASE II ORIENTATION from 08/30/2018 in [place]  Referring Provider  Sherren Mocha, MD      Initial Encounter Date:  Flowsheet Row CARDIAC REHAB PHASE II ORIENTATION from 08/30/2018 in [place]  Date  08/30/18      Visit Diagnosis: 06/14/18 NSTEMI  06/15/18 DES RCA  Patient's Home Medications on Admission:  Current Outpatient Medications:  .  ACCU-CHEK FASTCLIX LANCETS MISC, USE AS DIRECTED FOR DAILY TESTING, Disp: , Rfl:  .  ACCU-CHEK GUIDE test strip, daily. use as directed, Disp: , Rfl:  .  acetaminophen (TYLENOL 8 HOUR) 650 MG CR tablet, Take 1 tablet (650 mg total) by mouth every 8 (eight) hours as needed for pain., Disp: , Rfl:  .  amLODipine-olmesartan (AZOR) 5-20 MG per tablet, Take 1 tablet by mouth every morning. , Disp: , Rfl:  .  aspirin EC 81 MG tablet, Take 81 mg by mouth daily., Disp: , Rfl:  .  Cholecalciferol (VITAMIN D3) 50000 units CAPS, Take 50,000 Units by mouth once a week. On Friday, Disp: , Rfl: 1 .  clopidogrel (PLAVIX) 75 MG tablet, Take 1 tablet (75 mg total) by mouth daily with breakfast., Disp: 90 tablet, Rfl: 3 .  glipiZIDE (GLUCOTROL) 10 MG tablet, Take 10 mg by mouth 2 (two) times daily., Disp: , Rfl:  .  insulin glargine (LANTUS) 100 unit/mL SOPN, Inject 18 Units into the skin at bedtime. , Disp: , Rfl:  .  levothyroxine (SYNTHROID, LEVOTHROID) 50 MCG tablet, Take 1 tablet by mouth daily., Disp: , Rfl:  .  metFORMIN (GLUCOPHAGE) 500 MG tablet, Take 1,000 mg by mouth at bedtime. , Disp: , Rfl:  .  metoprolol succinate (TOPROL XL) 25 MG 24 hr tablet, Take 1 tablet (25 mg total) by mouth daily., Disp: 90 tablet, Rfl: 3 .  nitroGLYCERIN (NITROSTAT) 0.4 MG SL tablet, Place 1 tablet (0.4 mg total) under the tongue every 5 (five)  minutes x 3 doses as needed for chest pain., Disp: 25 tablet, Rfl: 1 .  RESTASIS MULTIDOSE 0.05 % ophthalmic emulsion, Apply 1 drop to eye 2 (two) times daily. , Disp: , Rfl:  .  rosuvastatin (CRESTOR) 20 MG tablet, Take 1 tablet (20 mg total) by mouth daily., Disp: 90 tablet, Rfl: 3 .  sitaGLIPtin (JANUVIA) 100 MG tablet, Take 100 mg by mouth every morning. , Disp: , Rfl:  .  Umeclidinium-Vilanterol (ANORO ELLIPTA) 62.5-25 MCG/INH AEPB, Inhale 1 puff into the lungs daily. , Disp: , Rfl:   Past Medical History: Past Medical History:  Diagnosis Date  . Arthritis   . Asthma   . CAD (coronary artery disease)    s/p NSTEMI 10/19 - [facility]:  pRCA 99, oLCx 30 >> DES to RCA; Echo 10/19: Mild focal basal septal hypertrophy, EF 60-65, inf-lat, inf and inf-sept HK, trivial MR, trivial TR  . Diabetes mellitus without complication ([place])   . Hypertension     Tobacco Use: Social History   Tobacco Use  Smoking Status Former Smoker  . Packs/day: 0.50  . Years: 60.00  . Pack years: 30.00  Smokeless Tobacco Never Used    Labs: Recent Review Flowsheet Data    Labs for ITP Cardiac and Pulmonary Rehab Latest Ref Rng & Units 06/15/2018 07/12/2018   Cholestrol 100 - 199 mg/dL 108 109  LDLCALC 0 - 99 mg/dL 38 30   HDL >39 mg/dL 41 50   Trlycerides 0 - 149 mg/dL 146 143      Capillary Blood Glucose: Lab Results  Component Value Date   GLUCAP 86 09/09/2018   GLUCAP 162 (H) 09/07/2018   GLUCAP 228 (H) 09/07/2018   GLUCAP 199 (H) 06/16/2018   GLUCAP 138 (H) 06/16/2018     Exercise Target Goals: Exercise Program Goal: Individual exercise prescription set using results from initial 6 min walk test and THRR while considering  patient's activity barriers and safety.   Exercise Prescription Goal: Initial exercise prescription builds to 30-45 minutes a day of aerobic activity, 2-3 days per week.  Home exercise guidelines will be given to patient during program as part of exercise prescription that  the participant will acknowledge.  Activity Barriers & Risk Stratification: Activity Barriers & Cardiac Risk Stratification - 08/30/18 0840    Activity Barriers & Cardiac Risk Stratification          Activity Barriers  Arthritis;Other (comment)    Comments  Vertigo    Cardiac Risk Stratification  High           6 Minute Walk: 6 Minute Walk    6 Minute Walk    Row Name 08/30/18 0933   Phase  Initial   Distance  1210 feet   Walk Time  6 minutes   # of Rest Breaks  1 38 seconds   MPH  2.29   METS  2.02   RPE  11   Perceived Dyspnea   0   VO2 Peak  7.08   Symptoms  Yes (comment)   Comments  Patient c/o dizziness.   Resting HR  69 bpm   Resting BP  124/80   Resting Oxygen Saturation   94 %   Exercise Oxygen Saturation  during 6 min walk  97 %   Max Ex. HR  88 bpm   Max Ex. BP  124/60   2 Minute Post BP  140/80          Oxygen Initial Assessment:   Oxygen Re-Evaluation:   Oxygen Discharge (Final Oxygen Re-Evaluation):   Initial Exercise Prescription: Initial Exercise Prescription - 08/30/18 1100    Date of Initial Exercise RX and Referring Provider          Date  08/30/18    Referring Provider  Sherren Mocha, MD    Expected Discharge Date  11/25/18        Recumbant Bike          Level  2    Minutes  10    METs  2        NuStep          Level  2    SPM  85    Minutes  10    METs  2        Track          Laps  8    Minutes  10    METs  2.39        Prescription Details          Frequency (times per week)  3    Duration  Progress to 30 minutes of continuous aerobic without signs/symptoms of physical distress        Intensity          THRR 40-80% of Max Heartrate  56-111    Ratings of Perceived Exertion  11-13  Perceived Dyspnea  0-4        Progression          Progression  Continue to progress workloads to maintain intensity without signs/symptoms of physical distress.        Resistance Training          Training  Prescription  Yes    Weight  2lbs    Reps  10-15           Perform Capillary Blood Glucose checks as needed.  Exercise Prescription Changes: Exercise Prescription Changes    Response to Exercise    Row Name 09/07/18 0900 09/14/18 1000 09/26/18 0900 10/18/18 1300   Blood Pressure (Admit)  118/70  no documentation  122/72  128/72   Blood Pressure (Exercise)  124/60  no documentation  130/72  132/72   Blood Pressure (Exit)  122/78  no documentation  130/64  110/74   Heart Rate (Admit)  66 bpm  no documentation  77 bpm  81 bpm   Heart Rate (Exercise)  96 bpm  no documentation  103 bpm  96 bpm   Heart Rate (Exit)  69 bpm  no documentation  80 bpm  80 bpm   Rating of Perceived Exertion (Exercise)  11  no documentation  11  12   Symptoms  no documentation  no documentation  no documentation  None   Comments  Pt first day of exercise.   no documentation  no documentation  no documentation   Duration  Progress to 30 minutes of  aerobic without signs/symptoms of physical distress  no documentation  Progress to 30 minutes of  aerobic without signs/symptoms of physical distress  Progress to 30 minutes of  aerobic without signs/symptoms of physical distress   Intensity  THRR unchanged  no documentation  THRR unchanged  THRR unchanged       Progression    Row Name 09/07/18 0900 09/14/18 1000 09/26/18 0900 10/18/18 1300   Progression  Continue to progress workloads to maintain intensity without signs/symptoms of physical distress.  no documentation  Continue to progress workloads to maintain intensity without signs/symptoms of physical distress.  Continue to progress workloads to maintain intensity without signs/symptoms of physical distress.   Average METs  2.6  no documentation  3.2  3.6       Resistance Training    Row Name 09/07/18 0900 09/14/18 1000 09/26/18 0900 10/18/18 1300   Training Prescription  No  no documentation  Yes  Yes   Weight  no documentation  no documentation  2 lbs.   2  lbs.    Reps  no documentation  no documentation  10-15  10-15   Time  no documentation  no documentation  10 Minutes  [place] Name 09/07/18 0900 09/14/18 1000 09/26/18 0900 10/18/18 1300   Level  2  no documentation  2  2   Minutes  10  no documentation  10  10   METs  2.5  no documentation  4  2.3       NuStep    Row Name 09/07/18 0900 09/14/18 1000 09/26/18 0900 10/18/18 1300   Level  2  no documentation  2  2   SPM  85  no documentation  85  85   Minutes  10  no documentation  10  10   METs  2.3  no documentation  3.5  4.7  Track    Row Name 09/07/18 0900 09/14/18 1000 09/26/18 0900 10/18/18 1300   Laps  12  no documentation  16  16   Minutes  10  no documentation  10  10   METs  3.07  no documentation  3.76  3.78       Home Exercise Plan    [place] Name 09/07/18 0900 09/14/18 1000 09/26/18 0900 10/18/18 1300   Plans to continue exercise at  no documentation  Home (comment)  no documentation  no documentation   Frequency  no documentation  Add 4 additional days to program exercise sessions.  no documentation  no documentation   Initial Home Exercises Provided  no documentation  09/14/18  no documentation  no documentation          Exercise Comments: Exercise Comments    Row Name 09/07/18 0940 09/14/18 1036 09/28/18 1029 10/25/18 1422   Exercise Comments  Pt first day of exercise. Tolerated exercise well.   Reviewed HEP with Pt. Pt was responsive and is exercising at home in addition to cardiac rehab.   Reviewed METs and goals with Pt. Pt is progressing well.   Reviewed METs and goals with Pt. Pt is progressing well.       Exercise Goals and Review: Exercise Goals    Exercise Goals    Row Name 08/30/18 0842   Increase Physical Activity  Yes   Intervention  Provide advice, education, support and counseling about physical activity/exercise needs.;Develop an individualized exercise prescription for aerobic and resistive training based  on initial evaluation findings, risk stratification, comorbidities and participant's personal goals.   Expected Outcomes  Short Term: Attend rehab on a regular basis to increase amount of physical activity.;Long Term: Add in home exercise to make exercise part of routine and to increase amount of physical activity.;Long Term: Exercising regularly at least 3-5 days a week.   Increase Strength and Stamina  Yes   Intervention  Provide advice, education, support and counseling about physical activity/exercise needs.;Develop an individualized exercise prescription for aerobic and resistive training based on initial evaluation findings, risk stratification, comorbidities and participant's personal goals.   Expected Outcomes  Short Term: Increase workloads from initial exercise prescription for resistance, speed, and METs.;Short Term: Perform resistance training exercises routinely during rehab and add in resistance training at home;Long Term: Improve cardiorespiratory fitness, muscular endurance and strength as measured by increased METs and functional capacity (6MWT)   Able to understand and use rate of perceived exertion (RPE) scale  Yes   Intervention  Provide education and explanation on how to use RPE scale   Expected Outcomes  Short Term: Able to use RPE daily in rehab to express subjective intensity level;Long Term:  Able to use RPE to guide intensity level when exercising independently   Knowledge and understanding of Target Heart Rate Range (THRR)  Yes   Intervention  Provide education and explanation of THRR including how the numbers were predicted and where they are located for reference   Expected Outcomes  Short Term: Able to state/look up THRR;Long Term: Able to use THRR to govern intensity when exercising independently;Short Term: Able to use daily as guideline for intensity in rehab   Able to check pulse independently  Yes   Intervention  Provide education and demonstration on how to check  pulse in carotid and radial arteries.;Review the importance of being able to check your own pulse for safety during independent exercise   Expected Outcomes  Short Term: Able to  explain why pulse checking is important during independent exercise;Long Term: Able to check pulse independently and accurately   Understanding of Exercise Prescription  Yes   Intervention  Provide education, explanation, and written materials on patient's individual exercise prescription   Expected Outcomes  Short Term: Able to explain program exercise prescription;Long Term: Able to explain home exercise prescription to exercise independently          Exercise Goals Re-Evaluation : Exercise Goals Re-Evaluation    Exercise Goal Re-Evaluation    Row Name 09/07/18 7741 09/14/18 1034 09/28/18 1027 10/25/18 1421   Exercise Goals Review  Increase Physical Activity;Increase Strength and Stamina;Able to understand and use rate of perceived exertion (RPE) scale;Knowledge and understanding of Target Heart Rate Range (THRR);Understanding of Exercise Prescription;Able to check pulse independently  Increase Physical Activity;Increase Strength and Stamina;Able to understand and use rate of perceived exertion (RPE) scale;Knowledge and understanding of Target Heart Rate Range (THRR);Understanding of Exercise Prescription;Able to check pulse independently  Increase Physical Activity;Increase Strength and Stamina;Able to understand and use rate of perceived exertion (RPE) scale;Knowledge and understanding of Target Heart Rate Range (THRR);Understanding of Exercise Prescription;Able to check pulse independently  Increase Physical Activity;Increase Strength and Stamina;Able to understand and use rate of perceived exertion (RPE) scale;Knowledge and understanding of Target Heart Rate Range (THRR);Understanding of Exercise Prescription;Able to check pulse independently   Comments  Pt first day of exercise. Tolerated exercise Rx well.  Reviewed HEP  with Pt. Pt was responsive and understands goals. Pt is walking 3-4 days at home for 30-45 minutes.   Reviewed METs and goals with Pt. Pt has a MET level of 3.2. Pt is tolerating exercise Rx well and continues to progress with each session. Pt is walking 7 days per week for 30-45 minutes in addition to the program.   Reviewed METs and goals with Pt. Pt has a MET level of 3.6. Pt is tolerating exercise Rx well and continues to progress with each session. Pt is walking 7 days per week for 30-45 minutes in addition to the program.    Expected Outcomes  Will continue to monitor and progress Pt as tolerated.   Will continue to monitor and progress Pt as tolerated.   Will continue to monitor and progress Pt as tolerated.   Will continue to monitor and progress Pt as tolerated.           Discharge Exercise Prescription (Final Exercise Prescription Changes): Exercise Prescription Changes - 10/18/18 1300    Response to Exercise          Blood Pressure (Admit)  128/72    Blood Pressure (Exercise)  132/72    Blood Pressure (Exit)  110/74    Heart Rate (Admit)  81 bpm    Heart Rate (Exercise)  96 bpm    Heart Rate (Exit)  80 bpm    Rating of Perceived Exertion (Exercise)  12    Symptoms  None    Duration  Progress to 30 minutes of  aerobic without signs/symptoms of physical distress    Intensity  THRR unchanged        Progression          Progression  Continue to progress workloads to maintain intensity without signs/symptoms of physical distress.    Average METs  3.6        Resistance Training          Training Prescription  Yes    Weight  2 lbs.     Reps  10-15  Time  10 Minutes        Recumbant Bike          Level  2    Minutes  10    METs  2.3        NuStep          Level  2    SPM  85    Minutes  10    METs  4.7        Track          Laps  16    Minutes  10    METs  3.78           Nutrition:  Target Goals: Understanding of nutrition guidelines, daily intake  of sodium <1544m, cholesterol <2067m calories 30% from fat and 7% or less from saturated fats, daily to have 5 or more servings of fruits and vegetables.  Biometrics: Pre Biometrics - 08/30/18 0830    Pre Biometrics          Height  5' 3.25" (1.607 m)    Weight  65.3 kg    Waist Circumference  34.5 inches    Hip Circumference  37 inches    Waist to Hip Ratio  0.93 %    BMI (Calculated)  25.29    Triceps Skinfold  17 mm    % Body Fat  36 %    Grip Strength  27.5 kg    Flexibility  9.75 in    Single Leg Stand  9.1 seconds            Nutrition Therapy Plan and Nutrition Goals: Nutrition Therapy & Goals - 08/30/18 1544    Nutrition Therapy          Diet  heart healthy, carb modified        Personal Nutrition Goals          Nutrition Goal  Pt to identify and limit food sources of saturated fat, trans fat, refined carbohydrates and sodium    Personal Goal #2  Pt to try 1 new heart healthy recipe a week.        Intervention Plan          Intervention  Prescribe, educate and counsel regarding individualized specific dietary modifications aiming towards targeted core components such as weight, hypertension, lipid management, diabetes, heart failure and other comorbidities.    Expected Outcomes  Short Term Goal: Understand basic principles of dietary content, such as calories, fat, sodium, cholesterol and nutrients.;Long Term Goal: Adherence to prescribed nutrition plan.           Nutrition Assessments: Nutrition Assessments - 08/30/18 1545    MEDFICTS Scores          Pre Score  30           Nutrition Goals Re-Evaluation:   Nutrition Goals Re-Evaluation:   Nutrition Goals Discharge (Final Nutrition Goals Re-Evaluation):   Psychosocial: Target Goals: Acknowledge presence or absence of significant depression and/or stress, maximize coping skills, provide positive support system. Participant is able to verbalize types and ability to use techniques and skills  needed for reducing stress and depression.  Initial Review & Psychosocial Screening: Initial Psych Review & Screening - 08/30/18 1155    Initial Review          Current issues with  None Identified        Family Dynamics          Good Support System?  Yes  Daughter-Cathy Jacqualin Combes        Barriers          Psychosocial barriers to participate in program  There are no identifiable barriers or psychosocial needs.        Screening Interventions          Interventions  Encouraged to exercise           Quality of Life Scores: Quality of Life - 08/30/18 1037    Quality of Life Scores          Health/Function Pre  29.6 %    Socioeconomic Pre  30 %    Psych/Spiritual Pre  30 %    Family Pre  30 %    GLOBAL Pre  29.8 %          Scores of 19 and below usually indicate a poorer quality of life in these areas.  A difference of  2-3 points is a clinically meaningful difference.  A difference of 2-3 points in the total score of the Quality of Life Index has been associated with significant improvement in overall quality of life, self-image, physical symptoms, and general health in studies assessing change in quality of life.  PHQ-9: Recent Review Flowsheet Data    Depression screen [facility] 2/9 09/07/2018   Decreased Interest 0   Down, Depressed, Hopeless 0   PHQ - 2 Score 0     Interpretation of Total Score  Total Score Depression Severity:  1-4 = Minimal depression, 5-9 = Mild depression, 10-14 = Moderate depression, 15-19 = Moderately severe depression, 20-27 = Severe depression   Psychosocial Evaluation and Intervention: Psychosocial Evaluation - 09/07/18 1027    Psychosocial Evaluation & Interventions          Interventions  Encouraged to exercise with the program and follow exercise prescription    Comments  No psychosocial needs identified, no interventions necessary. pt enjoys playing Bingo and walking in her community,    Expected Outcomes  pt will exhibit positive  outlook with good coping skills.     Continue Psychosocial Services   No Follow up required           Psychosocial Re-Evaluation: Psychosocial Re-Evaluation    Psychosocial Re-Evaluation    [place] Name 09/23/18 0746 10/25/18 1323   Current issues with  None Identified  None Identified   Comments  No psychosocial needs identified, no interventions necessary. pt enjoys playing Bingo and walking in her community,  No psychosocial needs identified, no interventions necessary. pt enjoys playing Bingo and walking in her community,   Expected Outcomes  pt will exhibit positive outlook with good coping skills.   pt will exhibit positive outlook with good coping skills.    Interventions  Encouraged to attend Cardiac Rehabilitation for the exercise  Encouraged to attend Cardiac Rehabilitation for the exercise   Continue Psychosocial Services   No Follow up required  No Follow up required          Psychosocial Discharge (Final Psychosocial Re-Evaluation): Psychosocial Re-Evaluation - 10/25/18 1323    Psychosocial Re-Evaluation          Current issues with  None Identified    Comments  No psychosocial needs identified, no interventions necessary. pt enjoys playing Bingo and walking in her community,    Expected Outcomes  pt will exhibit positive outlook with good coping skills.     Interventions  Encouraged to attend Cardiac Rehabilitation for the exercise    Continue Psychosocial Services   No  Follow up required           Vocational Rehabilitation: Provide vocational rehab assistance to qualifying candidates.   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 Participant will state  understanding/return demonstration of topics presented.  Learning Barriers/Preferences: Learning Barriers/Preferences - 08/30/18 0843    Learning Barriers/Preferences          Learning Barriers  Sight    Learning Preferences  Written Material           Education Topics: Count Your Pulse:  -Group instruction provided by verbal instruction, demonstration, patient participation and written materials to support subject.  Instructors address importance of being able to find your pulse and how to count your pulse when at home without a heart monitor.  Patients get hands on experience counting their pulse with staff help and individually. Flowsheet Row CARDIAC REHAB PHASE II EXERCISE from 10/19/2018 in [place]  Date  09/16/18  Instruction Review Code  2- Demonstrated Understanding      Heart Attack, Angina, and Risk Factor Modification:  -Group instruction provided by verbal instruction, video, and written materials to support subject.  Instructors address signs and symptoms of angina and heart attacks.    Also discuss risk factors for heart disease and how to make changes to improve heart health risk factors.   Functional Fitness:  -Group instruction provided by verbal instruction, demonstration, patient participation, and written materials to support subject.  Instructors address safety measures for doing things around the house.  Discuss how to get up and down off the floor, how to pick things up properly, how to safely get out of a chair without assistance, and balance training. Flowsheet Row CARDIAC REHAB PHASE II EXERCISE from 10/19/2018 in [place]  Date  09/09/18  Instruction Review Code  2- Demonstrated Understanding      Meditation and Mindfulness:  -Group instruction provided by verbal instruction, patient participation, and written materials to support subject.  Instructor addresses importance of mindfulness  and meditation practice to help reduce stress and improve awareness.  Instructor also leads participants through a meditation exercise.  Flowsheet Row CARDIAC REHAB PHASE II EXERCISE from 10/19/2018 in [place]  Date  09/07/18  Educator  [place]  Instruction Review Code  2- Demonstrated Understanding      Stretching for Flexibility and Mobility:  -Group instruction provided by verbal instruction, patient participation, and written materials to support subject.  Instructors lead participants through series of stretches that are designed to increase flexibility thus improving mobility.  These stretches are additional exercise for major muscle groups that are typically performed during regular warm up and cool down.   Hands Only CPR:  -Group verbal, video, and participation provides a basic overview of AHA guidelines for community CPR. Role-play of emergencies allow participants the opportunity to practice calling for help and chest compression technique with discussion of AED use.   Hypertension: -Group verbal and written instruction that provides a basic overview of hypertension including the most recent diagnostic guidelines, risk factor reduction with self-care instructions and medication management. Flowsheet Row CARDIAC REHAB PHASE II EXERCISE  from 10/19/2018 in [place]  Date  09/30/18  Educator  RN  Instruction Review Code  2- Demonstrated Understanding       Nutrition I class: Heart Healthy Eating:  -Group instruction provided by PowerPoint slides, verbal discussion, and written materials to support subject matter. The instructor gives an explanation and review of the Therapeutic Lifestyle Changes diet recommendations, which includes a discussion on lipid goals, dietary fat, sodium, fiber, plant stanol/sterol esters, sugar, and the components of a well-balanced, healthy diet.   Nutrition II class: Lifestyle Skills:   -Group instruction provided by PowerPoint slides, verbal discussion, and written materials to support subject matter. The instructor gives an explanation and review of label reading, grocery shopping for heart health, heart healthy recipe modifications, and ways to make healthier choices when eating out.   Diabetes Question & Answer:  -Group instruction provided by PowerPoint slides, verbal discussion, and written materials to support subject matter. The instructor gives an explanation and review of diabetes co-morbidities, pre- and post-prandial blood glucose goals, pre-exercise blood glucose goals, signs, symptoms, and treatment of hypoglycemia and hyperglycemia, and foot care basics.   Diabetes Blitz:  -Group instruction provided by PowerPoint slides, verbal discussion, and written materials to support subject matter. The instructor gives an explanation and review of the physiology behind type 1 and type 2 diabetes, diabetes medications and rational behind using different medications, pre- and post-prandial blood glucose recommendations and Hemoglobin A1c goals, diabetes diet, and exercise including blood glucose guidelines for exercising safely.    Portion Distortion:  -Group instruction provided by PowerPoint slides, verbal discussion, written materials, and food models to support subject matter. The instructor gives an explanation of serving size versus portion size, changes in portions sizes over the last 20 years, and what consists of a serving from each food group.   Stress Management:  -Group instruction provided by verbal instruction, video, and written materials to support subject matter.  Instructors review role of stress in heart disease and how to cope with stress positively.   Flowsheet Row CARDIAC REHAB PHASE II EXERCISE from 10/19/2018 in [place]  Date  09/23/18  Instruction Review Code  2- Demonstrated Understanding      Exercising on Your  Own:  -Group instruction provided by verbal instruction, power point, and written materials to support subject.  Instructors discuss benefits of exercise, components of exercise, frequency and intensity of exercise, and end points for exercise.  Also discuss use of nitroglycerin and activating EMS.  Review options of places to exercise outside of rehab.  Review guidelines for sex with heart disease. Flowsheet Row CARDIAC REHAB PHASE II EXERCISE from 10/19/2018 in [place]  Date  10/19/18  Educator  EP  Instruction Review Code  2- Demonstrated Understanding      Cardiac Drugs I:  -Group instruction provided by verbal instruction and written materials to support subject.  Instructor reviews cardiac drug classes: antiplatelets, anticoagulants, beta blockers, and statins.  Instructor discusses reasons, side effects, and lifestyle considerations for each drug class.   Cardiac Drugs II:  -Group instruction provided by verbal instruction and written materials to support subject.  Instructor reviews cardiac drug classes: angiotensin converting enzyme inhibitors (ACE-I), angiotensin II receptor blockers (ARBs), nitrates, and calcium channel blockers.  Instructor discusses reasons, side effects, and lifestyle considerations for each drug class. Flowsheet Row CARDIAC REHAB PHASE II EXERCISE from 10/19/2018 in [place]  Date  10/05/18  Instruction Review Code  2- Demonstrated Understanding      Anatomy and Physiology of the Circulatory System:  Group verbal and written instruction and models provide basic cardiac anatomy and physiology, with the coronary electrical and arterial systems. Review of: AMI, Angina, Valve disease, Heart Failure, Peripheral Artery Disease, Cardiac Arrhythmia, Pacemakers, and the ICD.   Other Education:  -Group or individual verbal, written, or video instructions that support the educational goals of the cardiac  rehab program.   Holiday Eating Survival Tips:  -Group instruction provided by PowerPoint slides, verbal discussion, and written materials to support subject matter. The instructor gives patients tips, tricks, and techniques to help them not only survive but enjoy the holidays despite the onslaught of food that accompanies the holidays.   Knowledge Questionnaire Score: Knowledge Questionnaire Score - 09/09/18 0957    Knowledge Questionnaire Score          Pre Score  17/24           Core Components/Risk Factors/Patient Goals at Admission: Personal Goals and Risk Factors at Admission - 08/30/18 1135    Core Components/Risk Factors/Patient Goals on Admission          Tobacco Cessation  Yes    Number of packs per day  0.5   none since heart attack   Intervention  Offer self-teaching materials, assist with locating and accessing local/national Quit Smoking programs, and support quit date choice.    Expected Outcomes  Short Term: Will quit all tobacco product use, adhering to prevention of relapse plan.;Long Term: Complete abstinence from all tobacco products for at least 12 months from quit date.    Diabetes  Yes    Intervention  Provide education about signs/symptoms and action to take for hypo/hyperglycemia.;Provide education about proper nutrition, including hydration, and aerobic/resistive exercise prescription along with prescribed medications to achieve blood glucose in normal ranges: Fasting glucose 65-99 mg/dL    Expected Outcomes  Short Term: Participant verbalizes understanding of the signs/symptoms and immediate care of hyper/hypoglycemia, proper foot care and importance of medication, aerobic/resistive exercise and nutrition plan for blood glucose control.;Long Term: Attainment of HbA1C < 7%.    Hypertension  Yes    Intervention  Provide education on lifestyle modifcations including regular physical activity/exercise, weight management, moderate sodium restriction and increased  consumption of fresh fruit, vegetables, and low fat dairy, alcohol moderation, and smoking cessation.;Monitor prescription use compliance.    Expected Outcomes  Short Term: Continued assessment and intervention until BP is < 140/27m HG in hypertensive participants. < 130/861mHG in hypertensive participants with diabetes, heart failure or chronic kidney disease.;Long Term: Maintenance of blood pressure at goal levels.    Lipids  Yes    Intervention  Provide education and support for participant on nutrition & aerobic/resistive exercise along with prescribed medications to achieve LDL <7016mHDL >68m75m  Expected Outcomes  Short Term: Participant states understanding of desired cholesterol values and is compliant with medications prescribed. Participant is following exercise prescription and nutrition guidelines.;Long Term: Cholesterol controlled with medications as prescribed, with individualized exercise RX and with personalized nutrition plan. Value goals: LDL < 70mg64mL > 40 mg.           Core Components/Risk Factors/Patient Goals Review:  Goals and Risk Factor Review    Core Components/Risk Factors/Patient Goals Review    Row Name 09/07/18 1028 09/23/18 0746 10/25/18 1322   Personal Goals Review  Tobacco Cessation;Diabetes;Hypertension;Lipids  Tobacco Cessation;Diabetes;Hypertension;Lipids  Tobacco Cessation;Diabetes;Hypertension;Lipids   Review  pt with multiple CAD RF demonstrates eagerness to participate in CR program. pt denies current tobacco use. pt checks home blood sugar. pt personal goals are to resume ability to walk 3 miles 5 day/week.    pt with multiple CAD RF demonstrates eagerness to participate in CR program. pt denies current tobacco use. pt checks home blood sugar. pt personal goals are to resume ability to walk 3 miles 5 day/week.  pt is very pleased to have strength/stamina to make her king size bed without resting and climbing upstairs without difficulty.  pt with multiple  CAD RF demonstrates eagerness to participate in CR program. pt denies current tobacco use. pt checks home blood sugar. pt is currently walking 29mnutes daily at home.  pt personal goals are to resume ability to walk 3 miles 5 day/week.  pt is very pleased to have strength/stamina to make her king size bed without resting and climbing upstairs without difficulty.   Expected Outcomes  pt will participate in CR exercise, nutrition and lifestyle modification opportunities to decrease overall RF and resume usual activities.   pt will participate in CR exercise, nutrition and lifestyle modification opportunities to decrease overall RF and resume usual activities.   pt will participate in CR exercise, nutrition and lifestyle modification opportunities to decrease overall RF and resume usual activities.           Core Components/Risk Factors/Patient Goals at Discharge (Final Review):  Goals and Risk Factor Review - 10/25/18 1322    Core Components/Risk Factors/Patient Goals Review          Personal Goals Review  Tobacco Cessation;Diabetes;Hypertension;Lipids    Review  pt with multiple CAD RF demonstrates eagerness to participate in CR program. pt denies current tobacco use. pt checks home blood sugar. pt is currently walking 486mutes daily at home.  pt personal goals are to resume ability to walk 3 miles 5 day/week.  pt is very pleased to have strength/stamina to make her king size bed without resting and climbing upstairs without difficulty.    Expected Outcomes  pt will participate in CR exercise, nutrition and lifestyle modification opportunities to decrease overall RF and resume usual activities.            ITP Comments: ITP Comments    Row Name 08/30/18 0839 09/07/18 1025 09/23/18 0745 10/25/18 1322   ITP Comments  Medical Director- Dr. TrFransico HimMD  pt started group exercise. pt tolerated light activity without difficulty. pt oriented to exercise equipment and safety routine. understanding  verbalized.   30 day ITP review. pt with good attendance and participation.   30 day ITP review. pt with good attendance and participation.       Comments:

## 2018-10-26 NOTE — Progress Notes (Signed)
OUTPATIENT CARDIAC REHAB  PMH:  Primary Cardiologist: Pt c/o dyspnea and wheezing with being in closed up room after smelling perfume on another patient.  Pt states she used MDI as prescribed this morning.  Pt walked outside for fresh air, which relieved her symptoms.  Andi Hence, RN, BSN Cardiac Pulmonary Rehab

## 2018-10-28 ENCOUNTER — Encounter (HOSPITAL_COMMUNITY)
Admission: RE | Admit: 2018-10-28 | Discharge: 2018-10-28 | Disposition: A | Discharge status: Home / Self Care | Payer: Medicare Other | Source: Ambulatory Visit | Attending: Cardiovascular Disease | Admitting: Cardiovascular Disease

## 2018-10-28 ENCOUNTER — Encounter (HOSPITAL_COMMUNITY): Payer: Medicare Other

## 2018-10-28 DIAGNOSIS — I214 Non-ST elevation (NSTEMI) myocardial infarction: Secondary | ICD-10-CM | POA: Diagnosis not present

## 2018-10-28 DIAGNOSIS — Z955 Presence of coronary angioplasty implant and graft: Secondary | ICD-10-CM

## 2018-10-31 ENCOUNTER — Encounter (HOSPITAL_COMMUNITY): Payer: Medicare Other

## 2018-10-31 ENCOUNTER — Encounter (HOSPITAL_COMMUNITY)
Admission: RE | Admit: 2018-10-31 | Discharge: 2018-10-31 | Disposition: A | Discharge status: Home / Self Care | Payer: Medicare Other | Source: Ambulatory Visit | Attending: Cardiovascular Disease | Admitting: Cardiovascular Disease

## 2018-10-31 DIAGNOSIS — Z955 Presence of coronary angioplasty implant and graft: Secondary | ICD-10-CM

## 2018-10-31 DIAGNOSIS — I214 Non-ST elevation (NSTEMI) myocardial infarction: Secondary | ICD-10-CM | POA: Diagnosis not present

## 2018-11-02 ENCOUNTER — Encounter (HOSPITAL_COMMUNITY)
Admission: RE | Admit: 2018-11-02 | Discharge: 2018-11-02 | Disposition: A | Discharge status: Home / Self Care | Payer: Medicare Other | Source: Ambulatory Visit | Attending: Cardiovascular Disease | Admitting: Cardiovascular Disease

## 2018-11-02 ENCOUNTER — Encounter (HOSPITAL_COMMUNITY): Payer: Medicare Other

## 2018-11-02 ENCOUNTER — Other Ambulatory Visit: Payer: Self-pay

## 2018-11-02 DIAGNOSIS — Z955 Presence of coronary angioplasty implant and graft: Secondary | ICD-10-CM

## 2018-11-02 DIAGNOSIS — I214 Non-ST elevation (NSTEMI) myocardial infarction: Secondary | ICD-10-CM | POA: Diagnosis not present

## 2018-11-03 DIAGNOSIS — E782 Mixed hyperlipidemia: Secondary | ICD-10-CM | POA: Diagnosis not present

## 2018-11-03 DIAGNOSIS — E118 Type 2 diabetes mellitus with unspecified complications: Secondary | ICD-10-CM | POA: Diagnosis not present

## 2018-11-03 DIAGNOSIS — I10 Essential (primary) hypertension: Secondary | ICD-10-CM | POA: Diagnosis not present

## 2018-11-03 DIAGNOSIS — E039 Hypothyroidism, unspecified: Secondary | ICD-10-CM | POA: Diagnosis not present

## 2018-11-04 ENCOUNTER — Encounter (HOSPITAL_COMMUNITY): Payer: Medicare Other

## 2018-11-04 ENCOUNTER — Other Ambulatory Visit: Payer: Self-pay

## 2018-11-04 ENCOUNTER — Encounter (HOSPITAL_COMMUNITY)
Admission: RE | Admit: 2018-11-04 | Discharge: 2018-11-04 | Disposition: A | Discharge status: Home / Self Care | Payer: Medicare Other | Source: Ambulatory Visit | Attending: Cardiovascular Disease | Admitting: Cardiovascular Disease

## 2018-11-04 DIAGNOSIS — Z955 Presence of coronary angioplasty implant and graft: Secondary | ICD-10-CM

## 2018-11-04 DIAGNOSIS — I214 Non-ST elevation (NSTEMI) myocardial infarction: Secondary | ICD-10-CM | POA: Diagnosis not present

## 2018-11-04 DIAGNOSIS — E038 Other specified hypothyroidism: Secondary | ICD-10-CM | POA: Diagnosis not present

## 2018-11-04 DIAGNOSIS — J449 Chronic obstructive pulmonary disease, unspecified: Secondary | ICD-10-CM | POA: Diagnosis not present

## 2018-11-04 DIAGNOSIS — E785 Hyperlipidemia, unspecified: Secondary | ICD-10-CM | POA: Diagnosis not present

## 2018-11-04 DIAGNOSIS — H409 Unspecified glaucoma: Secondary | ICD-10-CM | POA: Diagnosis not present

## 2018-11-07 ENCOUNTER — Encounter (HOSPITAL_COMMUNITY): Payer: Medicare Other

## 2018-11-07 ENCOUNTER — Telehealth (HOSPITAL_COMMUNITY): Payer: Self-pay | Admitting: Cardiac Rehabilitation

## 2018-11-07 NOTE — Telephone Encounter (Signed)
Phone call to pt to advise of CR Phase II departmental closing for 2 weeks.  LM on machine.  Andi Hence, RN, BSN Cardiac Pulmonary Rehab

## 2018-11-09 ENCOUNTER — Encounter (HOSPITAL_COMMUNITY): Payer: Medicare Other

## 2018-11-11 ENCOUNTER — Encounter (HOSPITAL_COMMUNITY): Payer: Medicare Other

## 2018-11-14 ENCOUNTER — Encounter (HOSPITAL_COMMUNITY): Payer: Medicare Other

## 2018-11-14 ENCOUNTER — Other Ambulatory Visit: Payer: Medicare Other

## 2018-11-14 ENCOUNTER — Telehealth (HOSPITAL_COMMUNITY): Payer: Self-pay

## 2018-11-14 NOTE — Telephone Encounter (Signed)
Phone call made to inform Pt about closure of Cardiac Rehab for additional 4 weeks due to [place]. Tentative reopen on on 12/12/18. Pt verbalized understanding.

## 2018-11-15 ENCOUNTER — Encounter (HOSPITAL_COMMUNITY): Payer: Self-pay | Admitting: Cardiac Rehabilitation

## 2018-11-15 DIAGNOSIS — Z955 Presence of coronary angioplasty implant and graft: Secondary | ICD-10-CM

## 2018-11-15 DIAGNOSIS — I214 Non-ST elevation (NSTEMI) myocardial infarction: Secondary | ICD-10-CM

## 2018-11-15 NOTE — Progress Notes (Signed)
Cardiac Individual Treatment Plan  Patient Details  Name: [name] MRN: [id number] Date of Birth: August 26, 1934 Referring Provider:   Flowsheet Row CARDIAC REHAB PHASE II ORIENTATION from 08/30/2018 in [facility]  Referring Provider  Sherren Mocha, MD      Initial Encounter Date:  Flowsheet Row CARDIAC REHAB PHASE II ORIENTATION from 08/30/2018 in [facility]  Date  08/30/18      Visit Diagnosis: 06/14/18 NSTEMI  06/15/18 DES RCA  Patient's Home Medications on Admission:  Current Outpatient Medications:  .  ACCU-CHEK FASTCLIX LANCETS MISC, USE AS DIRECTED FOR DAILY TESTING, Disp: , Rfl:  .  ACCU-CHEK GUIDE test strip, daily. use as directed, Disp: , Rfl:  .  acetaminophen (TYLENOL 8 HOUR) 650 MG CR tablet, Take 1 tablet (650 mg total) by mouth every 8 (eight) hours as needed for pain., Disp: , Rfl:  .  amLODipine-olmesartan (AZOR) 5-20 MG per tablet, Take 1 tablet by mouth every morning. , Disp: , Rfl:  .  aspirin EC 81 MG tablet, Take 81 mg by mouth daily., Disp: , Rfl:  .  Cholecalciferol (VITAMIN D3) 50000 units CAPS, Take 50,000 Units by mouth once a week. On Friday, Disp: , Rfl: 1 .  clopidogrel (PLAVIX) 75 MG tablet, Take 1 tablet (75 mg total) by mouth daily with breakfast., Disp: 90 tablet, Rfl: 3 .  glipiZIDE (GLUCOTROL) 10 MG tablet, Take 10 mg by mouth 2 (two) times daily., Disp: , Rfl:  .  insulin glargine (LANTUS) 100 unit/mL SOPN, Inject 18 Units into the skin at bedtime. , Disp: , Rfl:  .  levothyroxine (SYNTHROID, LEVOTHROID) 50 MCG tablet, Take 1 tablet by mouth daily., Disp: , Rfl:  .  metFORMIN (GLUCOPHAGE) 500 MG tablet, Take 1,000 mg by mouth at bedtime. , Disp: , Rfl:  .  metoprolol succinate (TOPROL XL) 25 MG 24 hr tablet, Take 1 tablet (25 mg total) by mouth daily., Disp: 90 tablet, Rfl: 3 .  nitroGLYCERIN (NITROSTAT) 0.4 MG SL tablet, Place 1 tablet (0.4 mg total) under the tongue every 5 (five)  minutes x 3 doses as needed for chest pain., Disp: 25 tablet, Rfl: 1 .  RESTASIS MULTIDOSE 0.05 % ophthalmic emulsion, Apply 1 drop to eye 2 (two) times daily. , Disp: , Rfl:  .  rosuvastatin (CRESTOR) 20 MG tablet, Take 1 tablet (20 mg total) by mouth daily., Disp: 90 tablet, Rfl: 3 .  sitaGLIPtin (JANUVIA) 100 MG tablet, Take 100 mg by mouth every morning. , Disp: , Rfl:  .  Umeclidinium-Vilanterol (ANORO ELLIPTA) 62.5-25 MCG/INH AEPB, Inhale 1 puff into the lungs daily. , Disp: , Rfl:   Past Medical History: Past Medical History:  Diagnosis Date  . Arthritis   . Asthma   . CAD (coronary artery disease)    s/p NSTEMI 10/19 - [facility]:  pRCA 99, oLCx 30 >> DES to RCA; Echo 10/19: Mild focal basal septal hypertrophy, EF 60-65, inf-lat, inf and inf-sept HK, trivial MR, trivial TR  . Diabetes mellitus without complication ([place])   . Hypertension     Tobacco Use: Social History   Tobacco Use  Smoking Status Former Smoker  . Packs/day: 0.50  . Years: 60.00  . Pack years: 30.00  Smokeless Tobacco Never Used    Labs: Recent Review Flowsheet Data    Labs for ITP Cardiac and Pulmonary Rehab Latest Ref Rng & Units 06/15/2018 07/12/2018   Cholestrol 100 - 199 mg/dL 108 109  LDLCALC 0 - 99 mg/dL 38 30   HDL >39 mg/dL 41 50   Trlycerides 0 - 149 mg/dL 146 143      Capillary Blood Glucose: Lab Results  Component Value Date   GLUCAP 86 09/09/2018   GLUCAP 162 (H) 09/07/2018   GLUCAP 228 (H) 09/07/2018   GLUCAP 199 (H) 06/16/2018   GLUCAP 138 (H) 06/16/2018     Exercise Target Goals: Exercise Program Goal: Individual exercise prescription set using results from initial 6 min walk test and THRR while considering  patient's activity barriers and safety.   Exercise Prescription Goal: Initial exercise prescription builds to 30-45 minutes a day of aerobic activity, 2-3 days per week.  Home exercise guidelines will be given to patient during program as part of exercise prescription that  the participant will acknowledge.  Activity Barriers & Risk Stratification: Activity Barriers & Cardiac Risk Stratification - 08/30/18 0840    Activity Barriers & Cardiac Risk Stratification          Activity Barriers  Arthritis;Other (comment)    Comments  Vertigo    Cardiac Risk Stratification  High           6 Minute Walk: 6 Minute Walk    6 Minute Walk    Row Name 08/30/18 0933   Phase  Initial   Distance  1210 feet   Walk Time  6 minutes   # of Rest Breaks  1 38 seconds   MPH  2.29   METS  2.02   RPE  11   Perceived Dyspnea   0   VO2 Peak  7.08   Symptoms  Yes (comment)   Comments  Patient c/o dizziness.   Resting HR  69 bpm   Resting BP  124/80   Resting Oxygen Saturation   94 %   Exercise Oxygen Saturation  during 6 min walk  97 %   Max Ex. HR  88 bpm   Max Ex. BP  124/60   2 Minute Post BP  140/80          Oxygen Initial Assessment:   Oxygen Re-Evaluation:   Oxygen Discharge (Final Oxygen Re-Evaluation):   Initial Exercise Prescription: Initial Exercise Prescription - 08/30/18 1100    Date of Initial Exercise RX and Referring Provider          Date  08/30/18    Referring Provider  Sherren Mocha, MD    Expected Discharge Date  11/25/18        Recumbant Bike          Level  2    Minutes  10    METs  2        NuStep          Level  2    SPM  85    Minutes  10    METs  2        Track          Laps  8    Minutes  10    METs  2.39        Prescription Details          Frequency (times per week)  3    Duration  Progress to 30 minutes of continuous aerobic without signs/symptoms of physical distress        Intensity          THRR 40-80% of Max Heartrate  56-111    Ratings of Perceived Exertion  11-13  Perceived Dyspnea  0-4        Progression          Progression  Continue to progress workloads to maintain intensity without signs/symptoms of physical distress.        Resistance Training          Training  Prescription  Yes    Weight  2lbs    Reps  10-15           Perform Capillary Blood Glucose checks as needed.  Exercise Prescription Changes: Exercise Prescription Changes    Response to Exercise    Row Name 09/07/18 0900 09/14/18 1000 09/26/18 0900 10/18/18 1300 11/10/18 0800   Blood Pressure (Admit)  118/70  no documentation  122/72  128/72  138/72   Blood Pressure (Exercise)  124/60  no documentation  130/72  132/72  150/62   Blood Pressure (Exit)  122/78  no documentation  130/64  110/74  124/70   Heart Rate (Admit)  66 bpm  no documentation  77 bpm  81 bpm  81 bpm   Heart Rate (Exercise)  96 bpm  no documentation  103 bpm  96 bpm  102 bpm   Heart Rate (Exit)  69 bpm  no documentation  80 bpm  80 bpm  80 bpm   Rating of Perceived Exertion (Exercise)  11  no documentation  _0 Symptoms  no documentation  no documentation  no documentation  None  None   Comments  Pt first day of exercise.   no documentation  no documentation  no documentation  no documentation   Duration  Progress to 30 minutes of  aerobic without signs/symptoms of physical distress  no documentation  Progress to 30 minutes of  aerobic without signs/symptoms of physical distress  Progress to 30 minutes of  aerobic without signs/symptoms of physical distress  Progress to 30 minutes of  aerobic without signs/symptoms of physical distress   Intensity  THRR unchanged  no documentation  THRR unchanged  THRR unchanged  THRR unchanged       Progression    Row Name 09/07/18 0900 09/14/18 1000 09/26/18 0900 10/18/18 1300 11/10/18 0800   Progression  Continue to progress workloads to maintain intensity without signs/symptoms of physical distress.  no documentation  Continue to progress workloads to maintain intensity without signs/symptoms of physical distress.  Continue to progress workloads to maintain intensity without signs/symptoms of physical distress.  Continue to progress workloads to maintain intensity without  signs/symptoms of physical distress.   Average METs  2.6  no documentation  3.2  3.6  3.2       Resistance Training    Row Name 09/07/18 0900 09/14/18 1000 09/26/18 0900 10/18/18 1300 11/10/18 0800   Training Prescription  No  no documentation  Yes  Yes  Yes   Weight  no documentation  no documentation  2 lbs.   2 lbs.   2 lbs.    Reps  no documentation  no documentation  10-15  10-15  10-15   Time  no documentation  no documentation  10 Minutes  [place] Name 09/07/18 0900 09/14/18 1000 09/26/18 0900 10/18/18 1300 11/10/18 0800   Level  2  no documentation  _1 Minutes  10  no documentation  _2 METs  2.5  no documentation  4  2.3  2.8       NuStep    Row Name 09/07/18 0900 09/14/18 1000 09/26/18 0900 10/18/18 1300 11/10/18 0800   Level  2  no documentation  _0 SPM  85  no documentation  85  85  85   Minutes  10  no documentation  _1 METs  2.3  no documentation  3.5  4.7  3.3       Track    Row Name 09/07/18 0900 09/14/18 1000 09/26/18 0900 10/18/18 1300 11/10/18 0800   Laps  12  no documentation  _2 Minutes  10  no documentation  _3 METs  3.07  no documentation  3.76  3.78  3.78       Home Exercise Plan    [place] Name 09/07/18 0900 09/14/18 1000 09/26/18 0900 10/18/18 1300 11/10/18 0800   Plans to continue exercise at  no documentation  Home (comment)  no documentation  no documentation  Home (comment)   Frequency  no documentation  Add 4 additional days to program exercise sessions.  no documentation  no documentation  Add 4 additional days to program exercise sessions.   Initial Home Exercises Provided  no documentation  09/14/18  no documentation  no documentation  09/14/18          Exercise Comments: Exercise Comments    Row Name 09/07/18 0940 09/14/18 1036 09/28/18 1029 10/25/18 1422 11/10/18 0901   Exercise Comments  Pt first day of exercise. Tolerated exercise well.    Reviewed HEP with Pt. Pt was responsive and is exercising at home in addition to cardiac rehab.   Reviewed METs and goals with Pt. Pt is progressing well.   Reviewed METs and goals with Pt. Pt is progressing well.   Unable to review METs and goals with Pt due to closure for COVID19.       Exercise Goals and Review: Exercise Goals    Exercise Goals    Row Name 08/30/18 0842   Increase Physical Activity  Yes   Intervention  Provide advice, education, support and counseling about physical activity/exercise needs.;Develop an individualized exercise prescription for aerobic and resistive training based on initial evaluation findings, risk stratification, comorbidities and participant's personal goals.   Expected Outcomes  Short Term: Attend rehab on a regular basis to increase amount of physical activity.;Long Term: Add in home exercise to make exercise part of routine and to increase amount of physical activity.;Long Term: Exercising regularly at least 3-5 days a week.   Increase Strength and Stamina  Yes   Intervention  Provide advice, education, support and counseling about physical activity/exercise needs.;Develop an individualized exercise prescription for aerobic and resistive training based on initial evaluation findings, risk stratification, comorbidities and participant's personal goals.   Expected Outcomes  Short Term: Increase workloads from initial exercise prescription for resistance, speed, and METs.;Short Term: Perform resistance training exercises routinely during rehab and add in resistance training at home;Long Term: Improve cardiorespiratory fitness, muscular endurance and strength as measured by increased METs and functional capacity (6MWT)   Able to understand and use rate of perceived exertion (RPE) scale  Yes   Intervention  Provide education and explanation on how to use RPE scale   Expected Outcomes  Short Term: Able to use RPE daily in rehab to express subjective intensity  level;Long Term:  Able to use RPE to guide intensity level when exercising independently   Knowledge and understanding of Target Heart Rate Range (THRR)  Yes   Intervention  Provide education and explanation of THRR including how the numbers were predicted and where they are located for reference   Expected Outcomes  Short Term: Able to state/look up THRR;Long Term: Able to use THRR to govern intensity when exercising independently;Short Term: Able to use daily as guideline for intensity in rehab   Able to check pulse independently  Yes   Intervention  Provide education and demonstration on how to check pulse in carotid and radial arteries.;Review the importance of being able to check your own pulse for safety during independent exercise   Expected Outcomes  Short Term: Able to explain why pulse checking is important during independent exercise;Long Term: Able to check pulse independently and accurately   Understanding of Exercise Prescription  Yes   Intervention  Provide education, explanation, and written materials on patient's individual exercise prescription   Expected Outcomes  Short Term: Able to explain program exercise prescription;Long Term: Able to explain home exercise prescription to exercise independently          Exercise Goals Re-Evaluation : Exercise Goals Re-Evaluation    Exercise Goal Re-Evaluation    Row Name 09/07/18 9798 09/14/18 1034 09/28/18 1027 10/25/18 1421 11/10/18 0901   Exercise Goals Review  Increase Physical Activity;Increase Strength and Stamina;Able to understand and use rate of perceived exertion (RPE) scale;Knowledge and understanding of Target Heart Rate Range (THRR);Understanding of Exercise Prescription;Able to check pulse independently  Increase Physical Activity;Increase Strength and Stamina;Able to understand and use rate of perceived exertion (RPE) scale;Knowledge and understanding of Target Heart Rate Range (THRR);Understanding of Exercise  Prescription;Able to check pulse independently  Increase Physical Activity;Increase Strength and Stamina;Able to understand and use rate of perceived exertion (RPE) scale;Knowledge and understanding of Target Heart Rate Range (THRR);Understanding of Exercise Prescription;Able to check pulse independently  Increase Physical Activity;Increase Strength and Stamina;Able to understand and use rate of perceived exertion (RPE) scale;Knowledge and understanding of Target Heart Rate Range (THRR);Understanding of Exercise Prescription;Able to check pulse independently  Increase Physical Activity;Increase Strength and Stamina;Able to understand and use rate of perceived exertion (RPE) scale;Knowledge and understanding of Target Heart Rate Range (THRR);Understanding of Exercise Prescription;Able to check pulse independently   Comments  Pt first day of exercise. Tolerated exercise Rx well.  Reviewed HEP with Pt. Pt was responsive and understands goals. Pt is walking 3-4 days at home for 30-45 minutes.   Reviewed METs and goals with Pt. Pt has a MET level of 3.2. Pt is tolerating exercise Rx well and continues to progress with each session. Pt is walking 7 days per week for 30-45 minutes in addition to the program.   Reviewed METs and goals with Pt. Pt has a MET level of 3.6. Pt is tolerating exercise Rx well and continues to progress with each session. Pt is walking 7 days per week for 30-45 minutes in addition to the program.   Unable to review METs and goals with Pt due to closure of Cardiac Rehab for [place]. Pt MET level is 3.2 and is progressing well. Pt is tolerating exercise Rx well.    Expected Outcomes  Will continue to monitor and progress Pt as tolerated.   Will continue to monitor and progress Pt as tolerated.   Will continue to monitor and progress Pt as tolerated.   Will continue to monitor and progress Pt as tolerated.  Will continue to monitor and progress Pt as tolerated.           Discharge Exercise  Prescription (Final Exercise Prescription Changes): Exercise Prescription Changes - 11/10/18 0800    Response to Exercise          Blood Pressure (Admit)  138/72    Blood Pressure (Exercise)  150/62    Blood Pressure (Exit)  124/70    Heart Rate (Admit)  81 bpm    Heart Rate (Exercise)  102 bpm    Heart Rate (Exit)  80 bpm    Rating of Perceived Exertion (Exercise)  12    Symptoms  None    Duration  Progress to 30 minutes of  aerobic without signs/symptoms of physical distress    Intensity  THRR unchanged        Progression          Progression  Continue to progress workloads to maintain intensity without signs/symptoms of physical distress.    Average METs  3.2        Resistance Training          Training Prescription  Yes    Weight  2 lbs.     Reps  10-15    Time  10 Minutes        Recumbant Bike          Level  2    Minutes  10    METs  2.8        NuStep          Level  2    SPM  85    Minutes  10    METs  3.3        Track          Laps  16    Minutes  10    METs  3.78        Home Exercise Plan          Plans to continue exercise at  Home (comment)    Frequency  Add 4 additional days to program exercise sessions.    Initial Home Exercises Provided  09/14/18           Nutrition:  Target Goals: Understanding of nutrition guidelines, daily intake of sodium <1533m, cholesterol <209m calories 30% from fat and 7% or less from saturated fats, daily to have 5 or more servings of fruits and vegetables.  Biometrics: Pre Biometrics - 08/30/18 0830    Pre Biometrics          Height  5' 3.25" (1.607 m)    Weight  143 lb 15.4 oz (65.3 kg)    Waist Circumference  34.5 inches    Hip Circumference  37 inches    Waist to Hip Ratio  0.93 %    BMI (Calculated)  25.29    Triceps Skinfold  17 mm    % Body Fat  36 %    Grip Strength  27.5 kg    Flexibility  9.75 in    Single Leg Stand  9.1 seconds            Nutrition Therapy Plan and Nutrition  Goals: Nutrition Therapy & Goals - 08/30/18 1544    Nutrition Therapy          Diet  heart healthy, carb modified        Personal Nutrition Goals          Nutrition Goal  Pt to identify and limit  food sources of saturated fat, trans fat, refined carbohydrates and sodium    Personal Goal #2  Pt to try 1 new heart healthy recipe a week.        Intervention Plan          Intervention  Prescribe, educate and counsel regarding individualized specific dietary modifications aiming towards targeted core components such as weight, hypertension, lipid management, diabetes, heart failure and other comorbidities.    Expected Outcomes  Short Term Goal: Understand basic principles of dietary content, such as calories, fat, sodium, cholesterol and nutrients.;Long Term Goal: Adherence to prescribed nutrition plan.           Nutrition Assessments: Nutrition Assessments - 08/30/18 1545    MEDFICTS Scores          Pre Score  30           Nutrition Goals Re-Evaluation:   Nutrition Goals Re-Evaluation:   Nutrition Goals Discharge (Final Nutrition Goals Re-Evaluation):   Psychosocial: Target Goals: Acknowledge presence or absence of significant depression and/or stress, maximize coping skills, provide positive support system. Participant is able to verbalize types and ability to use techniques and skills needed for reducing stress and depression.  Initial Review & Psychosocial Screening: Initial Psych Review & Screening - 08/30/18 1155    Initial Review          Current issues with  None Identified        Family Dynamics          Good Support System?  Yes   Daughter-Cathy Jacqualin Combes        Barriers          Psychosocial barriers to participate in program  There are no identifiable barriers or psychosocial needs.        Screening Interventions          Interventions  Encouraged to exercise           Quality of Life Scores: Quality of Life - 08/30/18 1037    Quality of Life  Scores          Health/Function Pre  29.6 %    Socioeconomic Pre  30 %    Psych/Spiritual Pre  30 %    Family Pre  30 %    GLOBAL Pre  29.8 %          Scores of 19 and below usually indicate a poorer quality of life in these areas.  A difference of  2-3 points is a clinically meaningful difference.  A difference of 2-3 points in the total score of the Quality of Life Index has been associated with significant improvement in overall quality of life, self-image, physical symptoms, and general health in studies assessing change in quality of life.  PHQ-9: Recent Review Flowsheet Data    Depression screen [facility] 2/9 09/07/2018   Decreased Interest 0   Down, Depressed, Hopeless 0   PHQ - 2 Score 0     Interpretation of Total Score  Total Score Depression Severity:  1-4 = Minimal depression, 5-9 = Mild depression, 10-14 = Moderate depression, 15-19 = Moderately severe depression, 20-27 = Severe depression   Psychosocial Evaluation and Intervention: Psychosocial Evaluation - 09/07/18 1027    Psychosocial Evaluation & Interventions          Interventions  Encouraged to exercise with the program and follow exercise prescription    Comments  No psychosocial needs identified, no interventions necessary. pt enjoys playing Bingo and walking in her community,  Expected Outcomes  pt will exhibit positive outlook with good coping skills.     Continue Psychosocial Services   No Follow up required           Psychosocial Re-Evaluation: Psychosocial Re-Evaluation    Psychosocial Re-Evaluation    [place] Name 09/23/18 0746 10/25/18 1323 11/09/18 1127   Current issues with  None Identified  None Identified  None Identified   Comments  No psychosocial needs identified, no interventions necessary. pt enjoys playing Bingo and walking in her community,  No psychosocial needs identified, no interventions necessary. pt enjoys playing Bingo and walking in her community,  No psychosocial needs identified, no  interventions necessary. pt enjoys playing Bingo and walking in her community,   Expected Outcomes  pt will exhibit positive outlook with good coping skills.   pt will exhibit positive outlook with good coping skills.   pt will exhibit positive outlook with good coping skills.    Interventions  Encouraged to attend Cardiac Rehabilitation for the exercise  Encouraged to attend Cardiac Rehabilitation for the exercise  Encouraged to attend Cardiac Rehabilitation for the exercise   Continue Psychosocial Services   No Follow up required  No Follow up required  No Follow up required          Psychosocial Discharge (Final Psychosocial Re-Evaluation): Psychosocial Re-Evaluation - 11/09/18 1127    Psychosocial Re-Evaluation          Current issues with  None Identified    Comments  No psychosocial needs identified, no interventions necessary. pt enjoys playing Bingo and walking in her community,    Expected Outcomes  pt will exhibit positive outlook with good coping skills.     Interventions  Encouraged to attend Cardiac Rehabilitation for the exercise    Continue Psychosocial Services   No Follow up required           Vocational Rehabilitation: Provide vocational rehab assistance to qualifying candidates.   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 Participant will state understanding/return demonstration of topics presented.  Learning Barriers/Preferences: Learning Barriers/Preferences - 08/30/18 0843    Learning Barriers/Preferences          Learning Barriers  Sight    Learning Preferences  Written Material           Education Topics: Count Your Pulse:  -Group instruction provided by  verbal instruction, demonstration, patient participation and written materials to support subject.  Instructors address importance of being able to find your pulse and how to count your pulse when at home without a heart monitor.  Patients get hands on experience counting their pulse with staff help and individually. Flowsheet Row CARDIAC REHAB PHASE II EXERCISE from 10/19/2018 in [place]  Date  09/16/18  Instruction Review Code  2- Demonstrated Understanding      Heart Attack, Angina, and Risk Factor Modification:  -Group instruction provided by verbal instruction, video, and written materials to support subject.  Instructors address signs and symptoms of angina and heart attacks.    Also discuss risk factors for heart disease and how to make changes to improve heart health risk factors.   Functional  Fitness:  -Group instruction provided by verbal instruction, demonstration, patient participation, and written materials to support subject.  Instructors address safety measures for doing things around the house.  Discuss how to get up and down off the floor, how to pick things up properly, how to safely get out of a chair without assistance, and balance training. Flowsheet Row CARDIAC REHAB PHASE II EXERCISE from 10/19/2018 in [place]  Date  09/09/18  Instruction Review Code  2- Demonstrated Understanding      Meditation and Mindfulness:  -Group instruction provided by verbal instruction, patient participation, and written materials to support subject.  Instructor addresses importance of mindfulness and meditation practice to help reduce stress and improve awareness.  Instructor also leads participants through a meditation exercise.  Flowsheet Row CARDIAC REHAB PHASE II EXERCISE from 10/19/2018 in [place]  Date  09/07/18  Educator  [place]  Instruction Review Code  2- Demonstrated Understanding       Stretching for Flexibility and Mobility:  -Group instruction provided by verbal instruction, patient participation, and written materials to support subject.  Instructors lead participants through series of stretches that are designed to increase flexibility thus improving mobility.  These stretches are additional exercise for major muscle groups that are typically performed during regular warm up and cool down.   Hands Only CPR:  -Group verbal, video, and participation provides a basic overview of AHA guidelines for community CPR. Role-play of emergencies allow participants the opportunity to practice calling for help and chest compression technique with discussion of AED use.   Hypertension: -Group verbal and written instruction that provides a basic overview of hypertension including the most recent diagnostic guidelines, risk factor reduction with self-care instructions and medication management. Flowsheet Row CARDIAC REHAB PHASE II EXERCISE from 10/19/2018 in [place]  Date  09/30/18  Educator  RN  Instruction Review Code  2- Demonstrated Understanding       Nutrition I class: Heart Healthy Eating:  -Group instruction provided by PowerPoint slides, verbal discussion, and written materials to support subject matter. The instructor gives an explanation and review of the Therapeutic Lifestyle Changes diet recommendations, which includes a discussion on lipid goals, dietary fat, sodium, fiber, plant stanol/sterol esters, sugar, and the components of a well-balanced, healthy diet.   Nutrition II class: Lifestyle Skills:  -Group instruction provided by PowerPoint slides, verbal discussion, and written materials to support subject matter. The instructor gives an explanation and review of label reading, grocery shopping for heart health, heart healthy recipe modifications, and ways to make healthier choices when eating out.   Diabetes Question &  Answer:  -Group instruction provided by PowerPoint slides, verbal discussion, and written materials to support subject matter. The instructor gives an explanation and review of diabetes co-morbidities, pre- and post-prandial blood glucose goals, pre-exercise blood glucose goals, signs, symptoms, and treatment of hypoglycemia and hyperglycemia, and foot care basics.   Diabetes Blitz:  -Group instruction provided by PowerPoint slides, verbal discussion, and written materials to support subject matter. The instructor gives an explanation and review of the physiology behind type 1 and type 2 diabetes, diabetes medications and rational behind using different medications, pre- and post-prandial blood glucose recommendations and Hemoglobin A1c goals, diabetes diet, and exercise including blood glucose guidelines for exercising safely.    Portion Distortion:  -Group instruction provided by PowerPoint slides, verbal discussion, written materials, and food models to support subject matter. The instructor gives an explanation  of serving size versus portion size, changes in portions sizes over the last 20 years, and what consists of a serving from each food group.   Stress Management:  -Group instruction provided by verbal instruction, video, and written materials to support subject matter.  Instructors review role of stress in heart disease and how to cope with stress positively.   Flowsheet Row CARDIAC REHAB PHASE II EXERCISE from 10/19/2018 in [place]  Date  09/23/18  Instruction Review Code  2- Demonstrated Understanding      Exercising on Your Own:  -Group instruction provided by verbal instruction, power point, and written materials to support subject.  Instructors discuss benefits of exercise, components of exercise, frequency and intensity of exercise, and end points for exercise.  Also discuss use of nitroglycerin and activating EMS.  Review options of places to  exercise outside of rehab.  Review guidelines for sex with heart disease. Flowsheet Row CARDIAC REHAB PHASE II EXERCISE from 10/19/2018 in [place]  Date  10/19/18  Educator  EP  Instruction Review Code  2- Demonstrated Understanding      Cardiac Drugs I:  -Group instruction provided by verbal instruction and written materials to support subject.  Instructor reviews cardiac drug classes: antiplatelets, anticoagulants, beta blockers, and statins.  Instructor discusses reasons, side effects, and lifestyle considerations for each drug class.   Cardiac Drugs II:  -Group instruction provided by verbal instruction and written materials to support subject.  Instructor reviews cardiac drug classes: angiotensin converting enzyme inhibitors (ACE-I), angiotensin II receptor blockers (ARBs), nitrates, and calcium channel blockers.  Instructor discusses reasons, side effects, and lifestyle considerations for each drug class. Flowsheet Row CARDIAC REHAB PHASE II EXERCISE from 10/19/2018 in [place]  Date  10/05/18  Instruction Review Code  2- Demonstrated Understanding      Anatomy and Physiology of the Circulatory System:  Group verbal and written instruction and models provide basic cardiac anatomy and physiology, with the coronary electrical and arterial systems. Review of: AMI, Angina, Valve disease, Heart Failure, Peripheral Artery Disease, Cardiac Arrhythmia, Pacemakers, and the ICD.   Other Education:  -Group or individual verbal, written, or video instructions that support the educational goals of the cardiac rehab program.   Holiday Eating Survival Tips:  -Group instruction provided by PowerPoint slides, verbal discussion, and written materials to support subject matter. The instructor gives patients tips, tricks, and techniques to help them not only survive but enjoy the holidays despite the onslaught of food that accompanies  the holidays.   Knowledge Questionnaire Score: Knowledge Questionnaire Score - 09/09/18 0957    Knowledge Questionnaire Score          Pre Score  17/24           Core Components/Risk Factors/Patient Goals at Admission: Personal Goals and Risk Factors at Admission - 08/30/18 1135    Core Components/Risk Factors/Patient Goals on Admission          Tobacco Cessation  Yes    Number of packs per day  0.5   none since heart attack   Intervention  Offer self-teaching materials, assist with locating and accessing local/national Quit Smoking programs, and support quit date choice.    Expected Outcomes  Short Term: Will quit all tobacco product use, adhering to prevention of relapse plan.;Long Term: Complete abstinence from all tobacco products for at least 12 months from quit date.    Diabetes  Yes  Intervention  Provide education about signs/symptoms and action to take for hypo/hyperglycemia.;Provide education about proper nutrition, including hydration, and aerobic/resistive exercise prescription along with prescribed medications to achieve blood glucose in normal ranges: Fasting glucose 65-99 mg/dL    Expected Outcomes  Short Term: Participant verbalizes understanding of the signs/symptoms and immediate care of hyper/hypoglycemia, proper foot care and importance of medication, aerobic/resistive exercise and nutrition plan for blood glucose control.;Long Term: Attainment of HbA1C < 7%.    Hypertension  Yes    Intervention  Provide education on lifestyle modifcations including regular physical activity/exercise, weight management, moderate sodium restriction and increased consumption of fresh fruit, vegetables, and low fat dairy, alcohol moderation, and smoking cessation.;Monitor prescription use compliance.    Expected Outcomes  Short Term: Continued assessment and intervention until BP is < 140/47m HG in hypertensive participants. < 130/825mHG in hypertensive participants with diabetes,  heart failure or chronic kidney disease.;Long Term: Maintenance of blood pressure at goal levels.    Lipids  Yes    Intervention  Provide education and support for participant on nutrition & aerobic/resistive exercise along with prescribed medications to achieve LDL <7032mHDL >86m52m  Expected Outcomes  Short Term: Participant states understanding of desired cholesterol values and is compliant with medications prescribed. Participant is following exercise prescription and nutrition guidelines.;Long Term: Cholesterol controlled with medications as prescribed, with individualized exercise RX and with personalized nutrition plan. Value goals: LDL < 70mg31mL > 40 mg.           Core Components/Risk Factors/Patient Goals Review:  Goals and Risk Factor Review    Core Components/Risk Factors/Patient Goals Review    Row Name 09/07/18 1028 09/23/18 0746 10/25/18 1322 11/09/18 1127   Personal Goals Review  Tobacco Cessation;Diabetes;Hypertension;Lipids  Tobacco Cessation;Diabetes;Hypertension;Lipids  Tobacco Cessation;Diabetes;Hypertension;Lipids  Tobacco Cessation;Diabetes;Hypertension;Lipids   Review  pt with multiple CAD RF demonstrates eagerness to participate in CR program. pt denies current tobacco use. pt checks home blood sugar. pt personal goals are to resume ability to walk 3 miles 5 day/week.    pt with multiple CAD RF demonstrates eagerness to participate in CR program. pt denies current tobacco use. pt checks home blood sugar. pt personal goals are to resume ability to walk 3 miles 5 day/week.  pt is very pleased to have strength/stamina to make her king size bed without resting and climbing upstairs without difficulty.  pt with multiple CAD RF demonstrates eagerness to participate in CR program. pt denies current tobacco use. pt checks home blood sugar. pt is currently walking 86min65m daily at home.  pt personal goals are to resume ability to walk 3 miles 5 day/week.  pt is very pleased to have  strength/stamina to make her king size bed without resting and climbing upstairs without difficulty.  pt with multiple CAD RF demonstrates eagerness to participate in CR program. pt denies current tobacco use. pt checks home blood sugar. pt is currently walking 86minu35mdaily at home.  pt personal goals are to resume ability to walk 3 miles 5 day/week.  pt is very pleased to have strength/stamina to make her king size bed without resting and climbing upstairs without difficulty.  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   Expected Outcomes  pt will participate in CR exercise, nutrition and lifestyle modification opportunities to decrease overall RF and resume usual activities.   pt will participate in CR exercise, nutrition and lifestyle modification opportunities to decrease overall RF and resume usual activities.  pt will participate in CR exercise, nutrition and lifestyle modification opportunities to decrease overall RF and resume usual activities.   pt will participate in CR exercise, nutrition and lifestyle modification opportunities to decrease overall RF and resume usual activities.           Core Components/Risk Factors/Patient Goals at Discharge (Final Review):  Goals and Risk Factor Review - 11/09/18 1127    Core Components/Risk Factors/Patient Goals Review          Personal Goals Review  Tobacco Cessation;Diabetes;Hypertension;Lipids    Review  pt with multiple CAD RF demonstrates eagerness to participate in CR program. pt denies current tobacco use. pt checks home blood sugar. pt is currently walking 86mnutes daily at home.  pt personal goals are to resume ability to walk 3 miles 5 day/week.  pt is very pleased to have strength/stamina to make her king size bed without resting and climbing upstairs without difficulty.  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    Expected Outcomes  pt will participate in CR exercise,  nutrition and lifestyle modification opportunities to decrease overall RF and resume usual activities.            ITP Comments: ITP Comments    Row Name 08/30/18 0839 09/07/18 1025 09/23/18 0745 10/25/18 1322 11/09/18 1127   ITP Comments  Medical Director- Dr. TFransico Him MD  pt started group exercise. pt tolerated light activity without difficulty. pt oriented to exercise equipment and safety routine. understanding verbalized.   30 day ITP review. pt with good attendance and participation.   30 day ITP review. pt with good attendance and participation.   30 day ITP review. pt with good attendance and participation. pt currently on hold for departmental closing from Covid 19 precautions   RBeaverName 11/15/18 1515   ITP Comments  30 day ITP review. pt with good attendance and participation. pt currently on hold for departmental closing from Covid 19 precautions      Comments:  JAndi Hence RN, BSN Cardiac Pulmonary Rehab

## 2018-11-16 ENCOUNTER — Encounter (HOSPITAL_COMMUNITY): Payer: Medicare Other

## 2018-11-18 ENCOUNTER — Encounter (HOSPITAL_COMMUNITY): Payer: Medicare Other

## 2018-11-21 ENCOUNTER — Encounter (HOSPITAL_COMMUNITY): Payer: Medicare Other

## 2018-11-23 ENCOUNTER — Encounter (HOSPITAL_COMMUNITY): Payer: Medicare Other

## 2018-11-25 ENCOUNTER — Encounter (HOSPITAL_COMMUNITY): Payer: Medicare Other

## 2018-11-28 ENCOUNTER — Encounter (HOSPITAL_COMMUNITY): Payer: Medicare Other

## 2018-11-30 ENCOUNTER — Encounter (HOSPITAL_COMMUNITY): Payer: Medicare Other

## 2018-11-30 ENCOUNTER — Telehealth (HOSPITAL_COMMUNITY): Payer: Self-pay | Admitting: Cardiac Rehabilitation

## 2018-11-30 NOTE — Telephone Encounter (Signed)
Pt phone call to inform of continued Outpatient Cardiac Rehab departmental closure for COVID 19 precautions. Future opening date to be determined.  Pt instructed to continue exercising on his own following home exercise guidelines.  She is continuing to walk on her own.   Pt advised to contact cardiology or PCP PRN symptoms, questions or concerns. Understanding verbalized.   Pt denies food insecurity or other needs at this time.  Pt offered emotional support and understanding. Pt expressed gratitude for the  call and is interested in resuming  CRPII when available.  Andi Hence, RN, BSN Cardiac Pulmonary Rehab

## 2018-12-02 ENCOUNTER — Encounter (HOSPITAL_COMMUNITY): Payer: Medicare Other

## 2018-12-05 ENCOUNTER — Encounter (HOSPITAL_COMMUNITY): Payer: Medicare Other

## 2018-12-07 ENCOUNTER — Encounter (HOSPITAL_COMMUNITY): Payer: Medicare Other

## 2018-12-09 ENCOUNTER — Encounter (HOSPITAL_COMMUNITY): Payer: Medicare Other

## 2018-12-15 DIAGNOSIS — E1169 Type 2 diabetes mellitus with other specified complication: Secondary | ICD-10-CM | POA: Diagnosis not present

## 2018-12-16 DIAGNOSIS — E1169 Type 2 diabetes mellitus with other specified complication: Secondary | ICD-10-CM | POA: Diagnosis not present

## 2018-12-16 DIAGNOSIS — E782 Mixed hyperlipidemia: Secondary | ICD-10-CM | POA: Diagnosis not present

## 2019-01-17 ENCOUNTER — Other Ambulatory Visit: Payer: Self-pay

## 2019-01-17 ENCOUNTER — Other Ambulatory Visit: Payer: Medicare Other

## 2019-01-17 ENCOUNTER — Telehealth (HOSPITAL_COMMUNITY): Payer: Self-pay

## 2019-01-17 DIAGNOSIS — I10 Essential (primary) hypertension: Secondary | ICD-10-CM

## 2019-01-17 DIAGNOSIS — E785 Hyperlipidemia, unspecified: Secondary | ICD-10-CM | POA: Diagnosis not present

## 2019-01-17 LAB — HEPATIC FUNCTION PANEL
ALT: 16 IU/L (ref 0–32)
AST: 19 IU/L (ref 0–40)
Albumin: 3.9 g/dL (ref 3.6–4.6)
Alkaline Phosphatase: 83 IU/L (ref 39–117)
Bilirubin Total: 0.5 mg/dL (ref 0.0–1.2)
Bilirubin, Direct: 0.17 mg/dL (ref 0.00–0.40)
Total Protein: 7 g/dL (ref 6.0–8.5)

## 2019-01-17 LAB — LIPID PANEL
Chol/HDL Ratio: 2.3 ratio (ref 0.0–4.4)
Cholesterol, Total: 101 mg/dL (ref 100–199)
HDL: 43 mg/dL (ref >39)
LDL Calculated: 38 mg/dL (ref 0–99)
Triglycerides: 101 mg/dL (ref 0–149)
VLDL Cholesterol Cal: 20 mg/dL (ref 5–40)

## 2019-01-17 NOTE — Telephone Encounter (Signed)
Phone call made to Pt to provide information about virtual cardiac rehab. Pt does not have a smart phone or smart device. Handouts will be sent to Pt by mail. Pt was informaed that if she has any questions she can call our office.

## 2019-02-09 DIAGNOSIS — H40013 Open angle with borderline findings, low risk, bilateral: Secondary | ICD-10-CM | POA: Diagnosis not present

## 2019-02-21 ENCOUNTER — Telehealth (HOSPITAL_COMMUNITY): Payer: Self-pay

## 2019-02-21 NOTE — Addendum Note (Signed)
Encounter addended by: Noel Christmas, RN on: 02/21/2019 3:58 PM  Actions taken: Clinical Note Signed

## 2019-02-21 NOTE — Progress Notes (Signed)
Discharge Progress Report  Patient Details  Name: [name] MRN: [id number] Date of Birth: 1935/06/29 Referring Provider:     CARDIAC REHAB PHASE II ORIENTATION from 08/30/2018 in [place]  Referring Provider  Sherren Mocha, MD       Number of Visits: 25  Reason for Discharge:  Patient reached a stable level of exercise. Patient independent in their exercise. Patient has met program and personal goals.  Smoking History:  Social History   Tobacco Use  Smoking Status Former Smoker  . Packs/day: 0.50  . Years: 60.00  . Pack years: 30.00  Smokeless Tobacco Never Used    Diagnosis:  06/14/18 NSTEMI  06/15/18 DES RCA  ADL [facility]:   Initial Exercise Prescription: Initial Exercise Prescription - 08/30/18 1100      Date of Initial Exercise RX and Referring Provider   Date  08/30/18    Referring Provider  Sherren Mocha, MD    Expected Discharge Date  11/25/18      Recumbant Bike   Level  2    Minutes  10    METs  2      NuStep   Level  2    SPM  85    Minutes  10    METs  2      Track   Laps  8    Minutes  10    METs  2.39      Prescription Details   Frequency (times per week)  3    Duration  Progress to 30 minutes of continuous aerobic without signs/symptoms of physical distress      Intensity   THRR 40-80% of Max Heartrate  56-111    Ratings of Perceived Exertion  11-13    Perceived Dyspnea  0-4      Progression   Progression  Continue to progress workloads to maintain intensity without signs/symptoms of physical distress.      Resistance Training   Training Prescription  Yes    Weight  2lbs    Reps  10-15       Discharge Exercise Prescription (Final Exercise Prescription Changes): Exercise Prescription Changes - 11/10/18 0800      Response to Exercise   Blood Pressure (Admit)  138/72    Blood Pressure (Exercise)  150/62    Blood Pressure (Exit)  124/70    Heart Rate (Admit)  81 bpm    Heart Rate  (Exercise)  102 bpm    Heart Rate (Exit)  80 bpm    Rating of Perceived Exertion (Exercise)  12    Symptoms  None    Duration  Progress to 30 minutes of  aerobic without signs/symptoms of physical distress    Intensity  THRR unchanged      Progression   Progression  Continue to progress workloads to maintain intensity without signs/symptoms of physical distress.    Average METs  3.2      Resistance Training   Training Prescription  Yes    Weight  2 lbs.     Reps  10-15    Time  10 Minutes      Recumbant Bike   Level  2    Minutes  10    METs  2.8      NuStep   Level  2    SPM  85    Minutes  10    METs  3.3      Track   Laps  16    Minutes  10    METs  3.78      Home Exercise Plan   Plans to continue exercise at  Home (comment)    Frequency  Add 4 additional days to program exercise sessions.    Initial Home Exercises Provided  09/14/18       Functional Capacity: 6 Minute Walk    Row Name 08/30/18 0933         6 Minute Walk   Phase  Initial     Distance  1210 feet     Walk Time  6 minutes     # of Rest Breaks  1 38 seconds     MPH  2.29     METS  2.02     RPE  11     Perceived Dyspnea   0     VO2 Peak  7.08     Symptoms  Yes (comment)     Comments  Patient c/o dizziness.     Resting HR  69 bpm     Resting BP  124/80     Resting Oxygen Saturation   94 %     Exercise Oxygen Saturation  during 6 min walk  97 %     Max Ex. HR  88 bpm     Max Ex. BP  124/60     2 Minute Post BP  140/80        Psychological, QOL, Others - Outcomes: PHQ 2/9: Depression screen PHQ 2/9 09/07/2018  Decreased Interest 0  Down, Depressed, Hopeless 0  PHQ - 2 Score 0    Quality of Life: Quality of Life - 08/30/18 1037      Quality of Life Scores   Health/Function Pre  29.6 %    Socioeconomic Pre  30 %    Psych/Spiritual Pre  30 %    Family Pre  30 %    GLOBAL Pre  29.8 %       Personal Goals: Goals established at orientation with interventions provided to  work toward goal. Personal Goals and Risk Factors at Admission - 08/30/18 1135      Core Components/Risk Factors/Patient Goals on Admission   Tobacco Cessation  Yes    Number of packs per day  0.5   none since heart attack   Intervention  Offer self-teaching materials, assist with locating and accessing local/national Quit Smoking programs, and support quit date choice.    Expected Outcomes  Short Term: Will quit all tobacco product use, adhering to prevention of relapse plan.;Long Term: Complete abstinence from all tobacco products for at least 12 months from quit date.    Diabetes  Yes    Intervention  Provide education about signs/symptoms and action to take for hypo/hyperglycemia.;Provide education about proper nutrition, including hydration, and aerobic/resistive exercise prescription along with prescribed medications to achieve blood glucose in normal ranges: Fasting glucose 65-99 mg/dL    Expected Outcomes  Short Term: Participant verbalizes understanding of the signs/symptoms and immediate care of hyper/hypoglycemia, proper foot care and importance of medication, aerobic/resistive exercise and nutrition plan for blood glucose control.;Long Term: Attainment of HbA1C < 7%.    Hypertension  Yes    Intervention  Provide education on lifestyle modifcations including regular physical activity/exercise, weight management, moderate sodium restriction and increased consumption of fresh fruit, vegetables, and low fat dairy, alcohol moderation, and smoking cessation.;Monitor prescription use compliance.    Expected Outcomes  Short Term: Continued assessment and intervention until  BP is < 140/35m HG in hypertensive participants. < 130/830mHG in hypertensive participants with diabetes, heart failure or chronic kidney disease.;Long Term: Maintenance of blood pressure at goal levels.    Lipids  Yes    Intervention  Provide education and support for participant on nutrition & aerobic/resistive exercise  along with prescribed medications to achieve LDL '70mg'$ , HDL >'40mg'$ .    Expected Outcomes  Short Term: Participant states understanding of desired cholesterol values and is compliant with medications prescribed. Participant is following exercise prescription and nutrition guidelines.;Long Term: Cholesterol controlled with medications as prescribed, with individualized exercise RX and with personalized nutrition plan. Value goals: LDL < '70mg'$ , HDL > 40 mg.        Personal Goals Discharge: Goals and Risk Factor Review    Row Name 09/07/18 1028 09/23/18 0746 10/25/18 1322 11/09/18 1127       Core Components/Risk Factors/Patient Goals Review   Personal Goals Review  Tobacco Cessation;Diabetes;Hypertension;Lipids  Tobacco Cessation;Diabetes;Hypertension;Lipids  Tobacco Cessation;Diabetes;Hypertension;Lipids  Tobacco Cessation;Diabetes;Hypertension;Lipids    Review  pt with multiple CAD RF demonstrates eagerness to participate in CR program. pt denies current tobacco use. pt checks home blood sugar. pt personal goals are to resume ability to walk 3 miles 5 day/week.    pt with multiple CAD RF demonstrates eagerness to participate in CR program. pt denies current tobacco use. pt checks home blood sugar. pt personal goals are to resume ability to walk 3 miles 5 day/week.  pt is very pleased to have strength/stamina to make her king size bed without resting and climbing upstairs without difficulty.  pt with multiple CAD RF demonstrates eagerness to participate in CR program. pt denies current tobacco use. pt checks home blood sugar. pt is currently walking 4066mtes daily at home.  pt personal goals are to resume ability to walk 3 miles 5 day/week.  pt is very pleased to have strength/stamina to make her king size bed without resting and climbing upstairs without difficulty.  pt with multiple CAD RF demonstrates eagerness to participate in CR program. pt denies current tobacco use. pt checks home blood sugar. pt is  currently walking 1m41mes daily at home.  pt personal goals are to resume ability to walk 3 miles 5 day/week.  pt is very pleased to have strength/stamina to make her king size bed without resting and climbing upstairs without difficulty.  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    Expected Outcomes  pt will participate in CR exercise, nutrition and lifestyle modification opportunities to decrease overall RF and resume usual activities.   pt will participate in CR exercise, nutrition and lifestyle modification opportunities to decrease overall RF and resume usual activities.   pt will participate in CR exercise, nutrition and lifestyle modification opportunities to decrease overall RF and resume usual activities.   pt will participate in CR exercise, nutrition and lifestyle modification opportunities to decrease overall RF and resume usual activities.        Exercise Goals and Review: Exercise Goals    Row Name 08/30/18 [id number]         Exercise Goals   Increase Physical Activity  Yes       Intervention  Provide advice, education, support and counseling about physical activity/exercise needs.;Develop an individualized exercise prescription for aerobic and resistive training based on initial evaluation findings, risk stratification, comorbidities and participant's personal goals.       Expected Outcomes  Short Term: Attend rehab on a  regular basis to increase amount of physical activity.;Long Term: Add in home exercise to make exercise part of routine and to increase amount of physical activity.;Long Term: Exercising regularly at least 3-5 days a week.       Increase Strength and Stamina  Yes       Intervention  Provide advice, education, support and counseling about physical activity/exercise needs.;Develop an individualized exercise prescription for aerobic and resistive training based on initial evaluation findings, risk stratification, comorbidities and  participant's personal goals.       Expected Outcomes  Short Term: Increase workloads from initial exercise prescription for resistance, speed, and METs.;Short Term: Perform resistance training exercises routinely during rehab and add in resistance training at home;Long Term: Improve cardiorespiratory fitness, muscular endurance and strength as measured by increased METs and functional capacity (6MWT)       Able to understand and use rate of perceived exertion (RPE) scale  Yes       Intervention  Provide education and explanation on how to use RPE scale       Expected Outcomes  Short Term: Able to use RPE daily in rehab to express subjective intensity level;Long Term:  Able to use RPE to guide intensity level when exercising independently       Knowledge and understanding of Target Heart Rate Range (THRR)  Yes       Intervention  Provide education and explanation of THRR including how the numbers were predicted and where they are located for reference       Expected Outcomes  Short Term: Able to state/look up THRR;Long Term: Able to use THRR to govern intensity when exercising independently;Short Term: Able to use daily as guideline for intensity in rehab       Able to check pulse independently  Yes       Intervention  Provide education and demonstration on how to check pulse in carotid and radial arteries.;Review the importance of being able to check your own pulse for safety during independent exercise       Expected Outcomes  Short Term: Able to explain why pulse checking is important during independent exercise;Long Term: Able to check pulse independently and accurately       Understanding of Exercise Prescription  Yes       Intervention  Provide education, explanation, and written materials on patient's individual exercise prescription       Expected Outcomes  Short Term: Able to explain program exercise prescription;Long Term: Able to explain home exercise prescription to exercise independently           Exercise Goals Re-Evaluation: Exercise Goals Re-Evaluation    Row Name 09/07/18 2353 09/14/18 1034 09/28/18 1027 10/25/18 1421 11/10/18 0901     Exercise Goal Re-Evaluation   Exercise Goals Review  Increase Physical Activity;Increase Strength and Stamina;Able to understand and use rate of perceived exertion (RPE) scale;Knowledge and understanding of Target Heart Rate Range (THRR);Understanding of Exercise Prescription;Able to check pulse independently  Increase Physical Activity;Increase Strength and Stamina;Able to understand and use rate of perceived exertion (RPE) scale;Knowledge and understanding of Target Heart Rate Range (THRR);Understanding of Exercise Prescription;Able to check pulse independently  Increase Physical Activity;Increase Strength and Stamina;Able to understand and use rate of perceived exertion (RPE) scale;Knowledge and understanding of Target Heart Rate Range (THRR);Understanding of Exercise Prescription;Able to check pulse independently  Increase Physical Activity;Increase Strength and Stamina;Able to understand and use rate of perceived exertion (RPE) scale;Knowledge and understanding of Target Heart Rate Range (THRR);Understanding of Exercise  Prescription;Able to check pulse independently  Increase Physical Activity;Increase Strength and Stamina;Able to understand and use rate of perceived exertion (RPE) scale;Knowledge and understanding of Target Heart Rate Range (THRR);Understanding of Exercise Prescription;Able to check pulse independently   Comments  Pt first day of exercise. Tolerated exercise Rx well.  Reviewed HEP with Pt. Pt was responsive and understands goals. Pt is walking 3-4 days at home for 30-45 minutes.   Reviewed METs and goals with Pt. Pt has a MET level of 3.2. Pt is tolerating exercise Rx well and continues to progress with each session. Pt is walking 7 days per week for 30-45 minutes in addition to the program.   Reviewed METs and goals with Pt. Pt has a  MET level of 3.6. Pt is tolerating exercise Rx well and continues to progress with each session. Pt is walking 7 days per week for 30-45 minutes in addition to the program.   Unable to review METs and goals with Pt due to closure of Card[facility] Rehab for [place]. Pt MET level is 3.2 and is progressing well. Pt is tolerating exercise Rx well.    Expected Outcomes  Will continue to monitor and progress Pt as tolerated.   Will continue to monitor and progress Pt as tolerated.   Will continue to monitor and progress Pt as tolerated.   Will continue to monitor and progress Pt as tolerated.   Will continue to monitor and progress Pt as tolerated.       Nutrition & Weight - Outcomes: Pre Biometrics - 08/30/18 0830      Pre Biometrics   Height  5' 3.25" (1.607 m)    Weight  65.3 kg    Waist Circumference  34.5 inches    Hip Circumference  37 inches    Waist to Hip Ratio  0.93 %    BMI (Calculated)  25.29    Triceps Skinfold  17 mm    % Body Fat  36 %    Grip Strength  27.5 kg    Flexibility  9.75 in    Single Leg Stand  9.1 seconds        Nutrition: Nutrition Therapy & Goals - 08/30/18 1544      Nutrition Therapy   Diet  heart healthy, carb modified      Personal Nutrition Goals   Nutrition Goal  Pt to identify and limit food sources of saturated fat, trans fat, refined carbohydrates and sodium    Personal Goal #2  Pt to try 1 new heart healthy recipe a week.      Intervention Plan   Intervention  Prescribe, educate and counsel regarding individualized specific dietary modifications aiming towards targeted core components such as weight, hypertension, lipid management, diabetes, heart failure and other comorbidities.    Expected Outcomes  Short Term Goal: Understand basic principles of dietary content, such as calories, fat, sodium, cholesterol and nutrients.;Long Term Goal: Adherence to prescribed nutrition plan.       Nutrition Discharge: Nutrition Assessments - 08/30/18 1545       MEDFICTS Scores   Pre Score  30       Education Questionnaire Score: Knowledge Questionnaire Score - 09/09/18 0957      Knowledge Questionnaire Score   Pre Score  17/24       Goals reviewed with patient; copy given to patient.

## 2019-02-21 NOTE — Telephone Encounter (Signed)
Called and spoke with pt in regards to CR, pt stated she will not be returning. [place].  Closed referral

## 2019-03-16 DIAGNOSIS — I10 Essential (primary) hypertension: Secondary | ICD-10-CM | POA: Diagnosis not present

## 2019-03-16 DIAGNOSIS — R799 Abnormal finding of blood chemistry, unspecified: Secondary | ICD-10-CM | POA: Diagnosis not present

## 2019-03-16 DIAGNOSIS — I2111 ST elevation (STEMI) myocardial infarction involving right coronary artery: Secondary | ICD-10-CM | POA: Diagnosis not present

## 2019-03-16 DIAGNOSIS — E785 Hyperlipidemia, unspecified: Secondary | ICD-10-CM | POA: Diagnosis not present

## 2019-03-16 DIAGNOSIS — E1169 Type 2 diabetes mellitus with other specified complication: Secondary | ICD-10-CM | POA: Diagnosis not present

## 2019-03-17 DIAGNOSIS — I10 Essential (primary) hypertension: Secondary | ICD-10-CM | POA: Diagnosis not present

## 2019-03-17 DIAGNOSIS — E1169 Type 2 diabetes mellitus with other specified complication: Secondary | ICD-10-CM | POA: Diagnosis not present

## 2019-03-30 NOTE — Progress Notes (Signed)
[place]   [phone] [phone]   Clinic Follow up Note   Patient Care Team: Lucianne Lei, MD as PCP - General (Family Medicine) Sherren Mocha, MD as PCP - Cardiology (Cardiology) Jackolyn Confer, MD as Consulting Physician (General Surgery) Truitt Merle, MD as Consulting Physician (Hematology)  Date of Service:  04/03/2019  CHIEF COMPLAINT: Follow up lymphoproliferative disease   CURRENT THERAPY:  observation  INTERVAL HISTORY:  [name] is here for a follow up lymphoproliferative disease. She was last seen by me 1 year ago. She presents to the clinic alone.  She is doing very well overall, denies any pain, fever or chills, night sweats, or other symptoms.  She has good energy level, good appetite, functions very well at home.  Her weight has been stable.  She had a heart attack in October 2019, and has recovered very well.  Her blood glucose has been high, Dr. Criss Rosales has added on new medication lately.    REVIEW OF SYSTEMS:   Constitutional: Denies fevers, chills or abnormal weight loss Eyes: Denies blurriness of vision Ears, nose, mouth, throat, and face: Denies mucositis or sore throat Respiratory: Denies cough, dyspnea or wheezes Cardiovascular: Denies palpitation, chest discomfort or lower extremity swelling Gastrointestinal:  Denies nausea, heartburn or change in bowel habits Skin: Denies abnormal skin rashes Lymphatics: Denies new lymphadenopathy or easy bruising Neurological:Denies numbness, tingling or new weaknesses Behavioral/Psych: Mood is stable, no new changes  All other systems were reviewed with the patient and are negative.  MEDICAL HISTORY:  Past Medical History:  Diagnosis Date  . Arthritis   . Asthma   . CAD (coronary artery disease)    s/p NSTEMI 10/19 - LHC:  pRCA 99, oLCx 30 >> DES to RCA; Echo 10/19: Mild focal basal septal hypertrophy, EF 60-65, inf-lat, inf and inf-sept HK, trivial MR, trivial TR  .  Diabetes mellitus without complication ([place])   . Hypertension     SURGICAL HISTORY: Past Surgical History:  Procedure Laterality Date  . ABDOMINAL HYSTERECTOMY  "many yrs ago"  . AXILLARY LYMPH NODE BIOPSY Right 02/26/2015   Procedure: RIGHT AXILLARY LYMPH NODE BIOPSY;  Surgeon: Jackolyn Confer, MD;  Location: [facility];  Service: General;  Laterality: Right;  . CHOLECYSTECTOMY    . CORONARY STENT INTERVENTION N/A 06/15/2018   Procedure: CORONARY STENT INTERVENTION;  Surgeon: Wellington Hampshire, MD;  Location: [facility];  Service: Cardiovascular;  Laterality: N/A;  . LEFT HEART CATH AND CORONARY ANGIOGRAPHY N/A 06/15/2018   Procedure: LEFT HEART CATH AND CORONARY ANGIOGRAPHY;  Surgeon: Wellington Hampshire, MD;  Location: [facility];  Service: Cardiovascular;  Laterality: N/A;    I have reviewed the social history and family history with the patient and they are unchanged from previous note.  ALLERGIES:  has No Known Allergies.  MEDICATIONS:  Current Outpatient Medications  Medication Sig Dispense Refill  . ACCU-CHEK FASTCLIX LANCETS MISC USE AS DIRECTED FOR DAILY TESTING    . ACCU-CHEK GUIDE test strip daily. use as directed    . acetaminophen (TYLENOL 8 HOUR) 650 MG CR tablet Take 1 tablet (650 mg total) by mouth every 8 (eight) hours as needed for pain.    Marland Kitchen amLODipine-olmesartan (AZOR) 5-20 MG per tablet Take 1 tablet by mouth every morning.     Marland Kitchen aspirin EC 81 MG tablet Take 81 mg by mouth daily.    . Cholecalciferol (VITAMIN D3) 50000 units CAPS Take 50,000 Units by mouth once a  week. On Friday  1  . clopidogrel (PLAVIX) 75 MG tablet Take 1 tablet (75 mg total) by mouth daily with breakfast. 90 tablet 3  . Dulaglutide (TRULICITY) 8.02 MV/3.6PQ SOPN Inject into the skin.    Marland Kitchen insulin glargine (LANTUS) 100 unit/mL SOPN Inject 22 Units into the skin at bedtime.     Marland Kitchen levothyroxine (SYNTHROID, LEVOTHROID) 50 MCG tablet Take 1 tablet by mouth daily.    . metFORMIN  (GLUCOPHAGE) 500 MG tablet Take 1,000 mg by mouth at bedtime.     . metoprolol succinate (TOPROL XL) 25 MG 24 hr tablet Take 1 tablet (25 mg total) by mouth daily. 90 tablet 3  . nitroGLYCERIN (NITROSTAT) 0.4 MG SL tablet Place 1 tablet (0.4 mg total) under the tongue every 5 (five) minutes x 3 doses as needed for chest pain. 25 tablet 1  . RESTASIS MULTIDOSE 0.05 % ophthalmic emulsion Apply 1 drop to eye 2 (two) times daily.     Marland Kitchen Umeclidinium-Vilanterol (ANORO ELLIPTA) 62.5-25 MCG/INH AEPB Inhale 1 puff into the lungs daily.     . rosuvastatin (CRESTOR) 20 MG tablet Take 1 tablet (20 mg total) by mouth daily. 90 tablet 3   No current facility-administered medications for this visit.     PHYSICAL EXAMINATION: ECOG PERFORMANCE STATUS: 0 - Asymptomatic  Vitals:   04/03/19 0812  BP: (!) 150/68  Pulse: 73  Resp: 17  Temp: 98.7 F (37.1 C)  SpO2: 96%   Filed Weights   04/03/19 0812  Weight: 147 lb 12.8 oz (67 kg)    GENERAL:alert, no distress and comfortable SKIN: skin color, texture, turgor are normal, no rashes or significant lesions EYES: normal, Conjunctiva are pink and non-injected, sclera clear  NECK: supple, thyroid normal size, non-tender, without nodularity LYMPH:  no palpable lymphadenopathy in the cervical, axillary or inguinal LUNGS: clear to auscultation and percussion with normal breathing effort HEART: regular rate & rhythm and no murmurs and no lower extremity edema ABDOMEN:abdomen soft, non-tender and normal bowel sounds Musculoskeletal:no cyanosis of digits and no clubbing  NEURO: alert & oriented x 3 with fluent speech, no focal motor/sensory deficits  LABORATORY DATA:  I have reviewed the data as listed CBC Latest Ref Rng & Units 04/03/2019 06/16/2018 06/15/2018  WBC 4.0 - 10.5 K/uL 9.4 11.4(H) 9.6  Hemoglobin 12.0 - 15.0 g/dL 11.9(L) 12.9 12.6  Hematocrit 36.0 - 46.0 % 36.9 39.5 38.9  Platelets 150 - 400 K/uL 242 255 241     CMP Latest Ref Rng & Units  01/17/2019 08/10/2018 07/12/2018  Glucose 70 - 99 mg/dL - - -  BUN 8 - 23 mg/dL - - -  Creatinine 0.44 - 1.00 mg/dL - - -  Sodium 135 - 145 mmol/L - - -  Potassium 3.5 - 5.1 mmol/L - - -  Chloride 98 - 111 mmol/L - - -  CO2 22 - 32 mmol/L - - -  Calcium 8.9 - 10.3 mg/dL - - -  Total Protein 6.0 - 8.5 g/dL 7.0 6.9 7.4  Total Bilirubin 0.0 - 1.2 mg/dL 0.5 0.6 0.7  Alkaline Phos 39 - 117 IU/L 83 83 78  AST 0 - 40 IU/L 19 36 25  ALT 0 - 32 IU/L 16 42(H) 36(H)      RADIOGRAPHIC STUDIES: I have personally reviewed the radiological images as listed and agreed with the findings in the report. No results found.   ASSESSMENT & PLAN:  [name] is a 84 y.o. female with  1. Lymphoproliferative disease, rule out lymphoma or CLL  -she was found to have bilateral axillary adenopathy in 2016, non-bulky, she is asymptomatic.  -I previously reviewed her lymph node biopsy results with her in details. The biopsy is suspicious for lymphoproliferative disease, especially low grade indolent lymphoma, especially marginal zone lymphoma. However no evidence of B cell monoclonality by flow. The FISH studies were also negative for IGH/BCL2 fusion or rearrangements of BCL6, or IGH rearrangement. Certainly, this could be reactive change also, although she does not have any chronic inflammation on chest wall or breasts. -CLL is also a possibility, she has been having increased lymphocytosis (4.8-5K), but her peripheral flow cytometry was negative for monoclonal B-cell.No evidence of CLL.  -CTs of the chest, abdomen pelvis axillary lymphadenopathy, no other enlarged lymph nodes. -She is clinically doing very well, no B symptoms or other concerns.  Exam today was negative for any peripheral adenopathy. -Lab reviewed, CBC all within normal limits, except hemoglobin 11.9, CMP and LDH are still pending -She is scheduled for mammogram on May 15, 2019, I will add on bilateral axillary lymph node evaluation by  ultrasound on same day -We discussed that the possibility of lymphoma is low, she prefers to follow-up with [place], follow-up in 1 year  2. HTN, DM, CAD -She will continue follow-up with her primary care physician and cardiologist    Plan -We will add bilateral axillary ultrasound to evaluate her lymph nodes on the day of her mammogram on 05/15/2019 -continue regular cancer screening -Lab and f/u in 1 year    No problem-specific Assessment & Plan notes found for this encounter.   Orders Placed This Encounter  Procedures  . US BREAST LTD UNI RIGHT INC AXILLA    Standing Status:   Future    Standing Expiration Date:   06/02/2020    Order Specific Question:   Reason for Exam (SYMPTOM  OR DIAGNOSIS REQUIRED)    Answer:   evaluate axillary nodes, history of adenopathy    Order Specific Question:   Preferred imaging location?    Answer:   [facility]  . US BREAST LTD UNI LEFT INC AXILLA    Standing Status:   Future    Standing Expiration Date:   06/02/2020    Order Specific Question:   Reason for Exam (SYMPTOM  OR DIAGNOSIS REQUIRED)    Answer:   axillary adnopathy    Order Specific Question:   Preferred imaging location?    Answer:   [facility]   All questions were answered. The patient knows to call the clinic with any problems, questions or concerns. No barriers to learning was detected. I spent 15 minutes counseling the patient face to face. The total time spent in the appointment was 20 minutes and more than 50% was on counseling and review of test results     Truitt Merle, MD 04/03/2019   I, Joslyn Devon, am acting as scribe for Truitt Merle, MD.   I have reviewed the above documentation for accuracy and completeness, and I agree with the above.

## 2019-03-31 ENCOUNTER — Other Ambulatory Visit: Payer: Self-pay | Admitting: Family Medicine

## 2019-03-31 DIAGNOSIS — Z1231 Encounter for screening mammogram for malignant neoplasm of breast: Secondary | ICD-10-CM

## 2019-04-03 ENCOUNTER — Other Ambulatory Visit: Payer: Self-pay

## 2019-04-03 ENCOUNTER — Other Ambulatory Visit: Payer: Self-pay | Admitting: Family Medicine

## 2019-04-03 ENCOUNTER — Inpatient Hospital Stay: Payer: Medicare Other | Attending: Hematology

## 2019-04-03 ENCOUNTER — Inpatient Hospital Stay: Payer: Medicare Other | Admitting: Hematology

## 2019-04-03 ENCOUNTER — Encounter: Payer: Self-pay | Admitting: Hematology

## 2019-04-03 VITALS — BP 150/68 | HR 73 | Temp 98.7°F | Resp 17 | Ht 63.25 in | Wt 147.8 lb

## 2019-04-03 DIAGNOSIS — I10 Essential (primary) hypertension: Secondary | ICD-10-CM | POA: Diagnosis not present

## 2019-04-03 DIAGNOSIS — I252 Old myocardial infarction: Secondary | ICD-10-CM | POA: Diagnosis not present

## 2019-04-03 DIAGNOSIS — I2510 Atherosclerotic heart disease of native coronary artery without angina pectoris: Secondary | ICD-10-CM | POA: Diagnosis not present

## 2019-04-03 DIAGNOSIS — Z79899 Other long term (current) drug therapy: Secondary | ICD-10-CM | POA: Diagnosis not present

## 2019-04-03 DIAGNOSIS — D72820 Lymphocytosis (symptomatic): Secondary | ICD-10-CM | POA: Insufficient documentation

## 2019-04-03 DIAGNOSIS — D479 Neoplasm of uncertain behavior of lymphoid, hematopoietic and related tissue, unspecified: Secondary | ICD-10-CM

## 2019-04-03 DIAGNOSIS — M1990 Unspecified osteoarthritis, unspecified site: Secondary | ICD-10-CM | POA: Insufficient documentation

## 2019-04-03 DIAGNOSIS — R590 Localized enlarged lymph nodes: Secondary | ICD-10-CM | POA: Insufficient documentation

## 2019-04-03 LAB — CBC WITH DIFFERENTIAL/PLATELET
Abs Immature Granulocytes: 0.02 10*3/uL (ref 0.00–0.07)
Basophils Absolute: 0.1 10*3/uL (ref 0.0–0.1)
Basophils Relative: 1 %
Eosinophils Absolute: 0.1 10*3/uL (ref 0.0–0.5)
Eosinophils Relative: 1 %
HCT: 36.9 % (ref 36.0–46.0)
Hemoglobin: 11.9 g/dL — ABNORMAL LOW (ref 12.0–15.0)
Immature Granulocytes: 0 %
Lymphocytes Relative: 53 %
Lymphs Abs: 5 10*3/uL — ABNORMAL HIGH (ref 0.7–4.0)
MCH: 27.7 pg (ref 26.0–34.0)
MCHC: 32.2 g/dL (ref 30.0–36.0)
MCV: 85.8 fL (ref 80.0–100.0)
Monocytes Absolute: 0.8 10*3/uL (ref 0.1–1.0)
Monocytes Relative: 9 %
Neutro Abs: 3.4 10*3/uL (ref 1.7–7.7)
Neutrophils Relative %: 36 %
Platelets: 242 10*3/uL (ref 150–400)
RBC: 4.3 MIL/uL (ref 3.87–5.11)
RDW: 14.3 % (ref 11.5–15.5)
WBC: 9.4 10*3/uL (ref 4.0–10.5)
nRBC: 0 % (ref 0.0–0.2)

## 2019-04-03 LAB — COMPREHENSIVE METABOLIC PANEL
ALT: 32 U/L (ref 0–44)
AST: 26 U/L (ref 15–41)
Albumin: 3.5 g/dL (ref 3.5–5.0)
Alkaline Phosphatase: 80 U/L (ref 38–126)
Anion gap: 11 (ref 5–15)
BUN: 7 mg/dL — ABNORMAL LOW (ref 8–23)
CO2: 26 mmol/L (ref 22–32)
Calcium: 9.8 mg/dL (ref 8.9–10.3)
Chloride: 103 mmol/L (ref 98–111)
Creatinine, Ser: 1.3 mg/dL — ABNORMAL HIGH (ref 0.44–1.00)
GFR calc Af Amer: 45 mL/min — ABNORMAL LOW (ref >60)
GFR calc non Af Amer: 38 mL/min — ABNORMAL LOW (ref >60)
Glucose, Bld: 281 mg/dL — ABNORMAL HIGH (ref 70–99)
Potassium: 4.3 mmol/L (ref 3.5–5.1)
Sodium: 140 mmol/L (ref 135–145)
Total Bilirubin: 0.7 mg/dL (ref 0.3–1.2)
Total Protein: 7.3 g/dL (ref 6.5–8.1)

## 2019-04-03 LAB — LACTATE DEHYDROGENASE: LDH: 155 U/L (ref 98–192)

## 2019-04-04 ENCOUNTER — Telehealth: Payer: Self-pay | Admitting: Hematology

## 2019-04-04 ENCOUNTER — Telehealth: Payer: Self-pay | Admitting: *Deleted

## 2019-04-04 NOTE — Telephone Encounter (Signed)
Called pt regarding Dr Ernestina Penna message to let her know LDH was in normal limits but Glucose & Creatinine was high & Dr Burr Medico suggest she f/u with her PCP. She states she has an appt in Oct with Dr Criss Rosales.  Suggested she call her soon.  She asked that we send results to her.  This nurse will route labs to Dr Criss Rosales.

## 2019-04-04 NOTE — Telephone Encounter (Signed)
Scheduled appt per 8/10 los.  Will print and mail out calendar when I am in the office the week of Aug 24.

## 2019-05-11 ENCOUNTER — Other Ambulatory Visit: Payer: Self-pay | Admitting: Hematology

## 2019-05-11 DIAGNOSIS — D479 Neoplasm of uncertain behavior of lymphoid, hematopoietic and related tissue, unspecified: Secondary | ICD-10-CM

## 2019-05-12 ENCOUNTER — Other Ambulatory Visit: Payer: Self-pay | Admitting: Nurse Practitioner

## 2019-05-12 DIAGNOSIS — D479 Neoplasm of uncertain behavior of lymphoid, hematopoietic and related tissue, unspecified: Secondary | ICD-10-CM

## 2019-05-15 ENCOUNTER — Other Ambulatory Visit: Payer: Self-pay

## 2019-05-15 ENCOUNTER — Other Ambulatory Visit: Payer: Self-pay | Admitting: Hematology

## 2019-05-15 ENCOUNTER — Ambulatory Visit: Payer: Medicare Other

## 2019-05-15 ENCOUNTER — Ambulatory Visit
Admission: RE | Admit: 2019-05-15 | Discharge: 2019-05-15 | Disposition: A | Discharge status: Home / Self Care | Payer: Medicare Other | Source: Ambulatory Visit | Attending: Hematology | Admitting: Hematology

## 2019-05-15 ENCOUNTER — Ambulatory Visit
Admission: RE | Admit: 2019-05-15 | Discharge: 2019-05-15 | Disposition: A | Discharge status: Home / Self Care | Payer: Medicare Other | Source: Ambulatory Visit | Attending: Family Medicine | Admitting: Family Medicine

## 2019-05-15 DIAGNOSIS — R928 Other abnormal and inconclusive findings on diagnostic imaging of breast: Secondary | ICD-10-CM | POA: Diagnosis not present

## 2019-05-15 DIAGNOSIS — N6489 Other specified disorders of breast: Secondary | ICD-10-CM | POA: Diagnosis not present

## 2019-05-15 DIAGNOSIS — D479 Neoplasm of uncertain behavior of lymphoid, hematopoietic and related tissue, unspecified: Secondary | ICD-10-CM

## 2019-05-16 ENCOUNTER — Telehealth: Payer: Self-pay | Admitting: *Deleted

## 2019-05-16 NOTE — Telephone Encounter (Signed)
-----   Message from Truitt Merle, MD sent at 05/16/2019  2:16 PM EDT ----- Please let pt know her [place] results, b/l axillary andropathy is stable since 2016, will continue monitoring.   Truitt Merle  05/16/2019

## 2019-05-16 NOTE — Telephone Encounter (Signed)
TCT patient regarding her axillary [place] from yesterday. Spoke with patient and advised that Dr. Burr Medico states her exam is unchanged from the one done in 2016 and she will continue to monitor her. Pt's next appt is in August 2021. Pt voiced understanding.

## 2019-05-25 DIAGNOSIS — Z0000 Encounter for general adult medical examination without abnormal findings: Secondary | ICD-10-CM | POA: Diagnosis not present

## 2019-05-25 DIAGNOSIS — E782 Mixed hyperlipidemia: Secondary | ICD-10-CM | POA: Diagnosis not present

## 2019-05-25 DIAGNOSIS — E1169 Type 2 diabetes mellitus with other specified complication: Secondary | ICD-10-CM | POA: Diagnosis not present

## 2019-05-25 DIAGNOSIS — E039 Hypothyroidism, unspecified: Secondary | ICD-10-CM | POA: Diagnosis not present

## 2019-05-25 DIAGNOSIS — I10 Essential (primary) hypertension: Secondary | ICD-10-CM | POA: Diagnosis not present

## 2019-05-25 DIAGNOSIS — E785 Hyperlipidemia, unspecified: Secondary | ICD-10-CM | POA: Diagnosis not present

## 2019-05-26 DIAGNOSIS — I10 Essential (primary) hypertension: Secondary | ICD-10-CM | POA: Diagnosis not present

## 2019-05-26 DIAGNOSIS — E1169 Type 2 diabetes mellitus with other specified complication: Secondary | ICD-10-CM | POA: Diagnosis not present

## 2019-05-26 DIAGNOSIS — E785 Hyperlipidemia, unspecified: Secondary | ICD-10-CM | POA: Diagnosis not present

## 2019-06-12 ENCOUNTER — Other Ambulatory Visit: Payer: Self-pay

## 2019-06-12 DIAGNOSIS — Z20822 Contact with and (suspected) exposure to covid-19: Secondary | ICD-10-CM

## 2019-06-14 LAB — NOVEL CORONAVIRUS, NAA: SARS-CoV-2, NAA: NOT DETECTED

## 2019-06-22 ENCOUNTER — Other Ambulatory Visit: Payer: Self-pay | Admitting: Physician Assistant

## 2019-06-26 ENCOUNTER — Other Ambulatory Visit: Payer: Self-pay | Admitting: Physician Assistant

## 2019-07-09 NOTE — Progress Notes (Signed)
Cardiology Office Note:    Date:  07/11/2019   ID:  [name], DOB December 01, 1934, MRN [id number]  PCP:  Madeline Lei, MD  Cardiologist:  Sherren Mocha, MD   Electrophysiologist:  None   Referring MD: Madeline Lei, MD   Chief Complaint  Patient presents with  . Follow-up    CAD     History of Present Illness:    [name] is a 84 y.o. female with:   Coronary artery disease  S/p NSTEMI 05/2018: DES to the proximal RCA  Echo 05/2018: EF 60-65 with inferior hypokinesis  Diabetes mellitus  Hypertension  Hyperlipidemia  Lymphoproliferative disorder  [name] was last seen in February 2020.  She returns for follow-up.  She is here alone.  She has not had chest discomfort.  She has dyspnea with some activities.  This is unchanged.  She has not had syncope, leg swelling.  She has not had any bleeding issues.  Recently, she has had a recurrence of vertigo.     Prior CV studies:   The following studies were reviewed today:   Echo 06/16/18 Mild focal basal septal hypertrophy, EF 60-65, inf-lat, inf and inf-sept HK, trivial MR, trivial TR  Cardiac Catheterization10/23/19 LM normal LAD irregs LCx ost 30 RCA prox 99 PCI: 2.5 x 18 mm [place] DES to RCA  Past Medical History:  Diagnosis Date  . Arthritis   . Asthma   . CAD (coronary artery disease)    s/p NSTEMI 10/19 - LHC:  pRCA 99, oLCx 30 >> DES to RCA; Echo 10/19: Mild focal basal septal hypertrophy, EF 60-65, inf-lat, inf and inf-sept HK, trivial MR, trivial TR  . Diabetes mellitus without complication ([place])   . Hypertension    Surgical Hx: The patient  has a past surgical history that includes Abdominal hysterectomy ("many yrs ago"); Axillary lymph node biopsy (Right, 02/26/2015); Cholecystectomy; LEFT HEART CATH AND CORONARY ANGIOGRAPHY (N/A, 06/15/2018); and CORONARY STENT INTERVENTION (N/A, 06/15/2018).   Current Medications: Current Meds  Medication Sig  . ACCU-CHEK FASTCLIX LANCETS MISC  USE AS DIRECTED FOR DAILY TESTING  . ACCU-CHEK GUIDE test strip daily. use as directed  . acetaminophen (TYLENOL 8 HOUR) 650 MG CR tablet Take 1 tablet (650 mg total) by mouth every 8 (eight) hours as needed for pain.  Marland Kitchen amLODipine-olmesartan (AZOR) 5-20 MG per tablet Take 1 tablet by mouth every morning.   Marland Kitchen aspirin EC 81 MG tablet Take 81 mg by mouth daily.  . Cholecalciferol (VITAMIN D3) 50000 units CAPS Take 50,000 Units by mouth once a week. On Friday  . clopidogrel (PLAVIX) 75 MG tablet Take 1 tablet by mouth once daily with breakfast  . Dulaglutide (TRULICITY) 4.94 WH/6.7RF SOPN Inject into the skin.  Marland Kitchen insulin glargine (LANTUS) 100 unit/mL SOPN Inject 22 Units into the skin at bedtime.   Marland Kitchen levothyroxine (SYNTHROID, LEVOTHROID) 50 MCG tablet Take 1 tablet by mouth daily.  . metFORMIN (GLUCOPHAGE) 500 MG tablet Take 1,000 mg by mouth at bedtime.   . metoprolol succinate (TOPROL-XL) 25 MG 24 hr tablet Take 1 tablet by mouth once daily  . nitroGLYCERIN (NITROSTAT) 0.4 MG SL tablet Place 1 tablet (0.4 mg total) under the tongue every 5 (five) minutes x 3 doses as needed for chest pain.  Marland Kitchen RESTASIS MULTIDOSE 0.05 % ophthalmic emulsion Apply 1 drop to eye 2 (two) times daily.   . rosuvastatin (CRESTOR) 20 MG tablet Take 1 tablet (20 mg total) by mouth daily.  Marland Kitchen Umeclidinium-Vilanterol [facility]  ELLIPTA) 62.5-25 MCG/INH AEPB Inhale 1 puff into the lungs daily.      Allergies:   Patient has no known allergies.   Social History   Tobacco Use  . Smoking status: Former Smoker    Packs/day: 0.50    Years: 60.00    Pack years: 30.00  . Smokeless tobacco: Never Used  Substance Use Topics  . Alcohol use: No  . Drug use: No     Family Hx: The patient's family history includes Diabetes in her brother, father, mother, and sister; Heart disease in her mother and sister.  ROS:   Please see the history of present illness.    ROS All other systems reviewed and are negative.   EKGs/Labs/Other  Test Reviewed:    EKG:  EKG is  ordered today.  The ekg ordered today demonstrates normal sinus rhythm, heart rate 75, normal axis, no ST-T wave changes, QTC 417  Recent Labs: 04/03/2019: ALT 32; BUN 7; Creatinine, Ser 1.30; Hemoglobin 11.9; Platelets 242; Potassium 4.3; Sodium 140   Recent Lipid Panel Lab Results  Component Value Date/Time   CHOL 101 01/17/2019 08:23 AM   TRIG 101 01/17/2019 08:23 AM   HDL 43 01/17/2019 08:23 AM   CHOLHDL 2.3 01/17/2019 08:23 AM   CHOLHDL 2.6 06/15/2018 05:42 AM   LDLCALC 38 01/17/2019 08:23 AM    Physical Exam:    VS:  BP 112/70   Pulse 75   Ht 5\' 3"  (1.6 m)   Wt 146 lb (66.2 kg)   SpO2 97%   BMI 25.86 kg/m     Wt Readings from Last 3 Encounters:  07/11/19 146 lb (66.2 kg)  04/03/19 147 lb 12.8 oz (67 kg)  09/28/18 146 lb 12.8 oz (66.6 kg)     Physical Exam  Constitutional: She is oriented to person, place, and time. She appears well-developed and well-nourished. No distress.  HENT:  Head: Normocephalic and atraumatic.  Neck: Neck supple. No JVD present.  Cardiovascular: Normal rate, regular rhythm, S1 normal, S2 normal and normal heart sounds.  No murmur heard. Pulmonary/Chest: Effort normal. She has no rales.  Abdominal: Soft. There is no hepatomegaly.  Musculoskeletal:        General: No edema.  Neurological: She is alert and oriented to person, place, and time.  Skin: Skin is warm and dry.    ASSESSMENT & PLAN:    1. Coronary artery disease involving native coronary artery of native heart without angina pectoris Status post non-ST elevation myocardial infarction in October 2019 treated with a drug-eluting stent to the RCA.  She is doing well without anginal symptoms.  Continue aspirin, clopidogrel, metoprolol succinate, amlodipine/olmesartan and rosuvastatin.  Follow-up in 1 year.  2. Essential hypertension The patient's blood pressure is controlled on her current regimen.  Continue current therapy.   3. Hyperlipidemia,  unspecified hyperlipidemia type LDL optimal on most recent lab work.  Continue current Rx.     Dispo:  Return in about 1 year (around 07/10/2020) for Routine Follow Up, w/ Dr. Burt Knack, or Richardson Dopp, PA-C, in person.   Medication Adjustments/Labs and Tests Ordered: Current medicines are reviewed at length with the patient today.  Concerns regarding medicines are outlined above.  Tests Ordered: Orders Placed This Encounter  Procedures  . EKG 12-Lead cs   Medication Changes: No orders of the defined types were placed in this encounter.   Signed, Richardson Dopp, PA-C  07/11/2019 9:37 AM    [facility]  [address], [place], [place]  [address] Phone: [phone]; Fax: [phone]

## 2019-07-11 ENCOUNTER — Other Ambulatory Visit: Payer: Self-pay

## 2019-07-11 ENCOUNTER — Ambulatory Visit: Payer: Medicare Other | Admitting: Physician Assistant

## 2019-07-11 ENCOUNTER — Encounter: Payer: Self-pay | Admitting: Physician Assistant

## 2019-07-11 ENCOUNTER — Telehealth: Payer: Self-pay | Admitting: Physician Assistant

## 2019-07-11 VITALS — BP 112/70 | HR 75 | Ht 63.0 in | Wt 146.0 lb

## 2019-07-11 DIAGNOSIS — E118 Type 2 diabetes mellitus with unspecified complications: Secondary | ICD-10-CM | POA: Diagnosis not present

## 2019-07-11 DIAGNOSIS — I2510 Atherosclerotic heart disease of native coronary artery without angina pectoris: Secondary | ICD-10-CM | POA: Diagnosis not present

## 2019-07-11 DIAGNOSIS — E785 Hyperlipidemia, unspecified: Secondary | ICD-10-CM | POA: Diagnosis not present

## 2019-07-11 DIAGNOSIS — H8309 Labyrinthitis, unspecified ear: Secondary | ICD-10-CM | POA: Diagnosis not present

## 2019-07-11 DIAGNOSIS — Z794 Long term (current) use of insulin: Secondary | ICD-10-CM | POA: Diagnosis not present

## 2019-07-11 DIAGNOSIS — I10 Essential (primary) hypertension: Secondary | ICD-10-CM

## 2019-07-11 DIAGNOSIS — E1169 Type 2 diabetes mellitus with other specified complication: Secondary | ICD-10-CM | POA: Diagnosis not present

## 2019-07-11 NOTE — Telephone Encounter (Signed)
Please call the patient. Tell her I spoke with Dr. Burt Knack.  Since her heart attack was more than 1 year ago, we can stop her Aspirin.  She will need to remain on Clopidogrel (Plavix) indefinitely.   PLAN:  1. DC ASA Richardson Dopp, PA-C    07/11/2019 3:51 PM

## 2019-07-11 NOTE — Patient Instructions (Signed)
Medication Instructions:  No changes.  See your medication list.  *If you need a refill on your cardiac medications before your next appointment, please call your pharmacy*  Lab Work: None   If you have labs (blood work) drawn today and your tests are completely normal, you will receive your results only by: Marland Kitchen MyChart Message (if you have MyChart) OR . A paper copy in the mail If you have any lab test that is abnormal or we need to change your treatment, we will call you to review the results.  Testing/Procedures: None   Follow-Up: At [facility], you and your health needs are our priority.  As part of our continuing mission to provide you with exceptional heart care, we have created designated Provider Care Teams.  These Care Teams include your primary Cardiologist (physician) and Advanced Practice Providers (APPs -  Physician Assistants and Nurse Practitioners) who all work together to provide you with the care you need, when you need it.  Your next appointment:   12 months  The format for your next appointment:   In Person  Provider:   Sherren Mocha, MD or Richardson Dopp, PA-C  Other Instructions  Ask your doctor about getting a referral to Vestibular Rehabilitation for your vertigo.

## 2019-07-12 ENCOUNTER — Other Ambulatory Visit: Payer: Self-pay | Admitting: *Deleted

## 2019-07-12 NOTE — Telephone Encounter (Signed)
S/w pt is aware of recommendation's.  Pt will D/C ASA, medication list updated.

## 2019-07-24 NOTE — Telephone Encounter (Signed)
No other changes.  She can just stop the ASA.  With taking Plavix, she no longer needs both meds. She can d/w her PCP as well. Richardson Dopp, PA-C    07/24/2019 4:22 PM

## 2019-07-24 NOTE — Telephone Encounter (Signed)
S/w pt stated asa was started by PCP not this office and will t/w PCP about d/c ASA.  I see asa on med list from ov but it has been taken off pt's permanent list.  Pt was very confused and started listing all meds one by one to see if pt can get off other meds. Pt stated how about metoprolol she was sure this was taken off list.  Stated pt needed to stay on all other medications.  Will send to [place] to [place].

## 2019-07-25 DIAGNOSIS — E1051 Type 1 diabetes mellitus with diabetic peripheral angiopathy without gangrene: Secondary | ICD-10-CM | POA: Diagnosis not present

## 2019-07-31 ENCOUNTER — Other Ambulatory Visit: Payer: Self-pay | Admitting: Physician Assistant

## 2019-08-01 DIAGNOSIS — H8309 Labyrinthitis, unspecified ear: Secondary | ICD-10-CM | POA: Diagnosis not present

## 2019-08-07 ENCOUNTER — Telehealth: Payer: Self-pay | Admitting: Cardiovascular Disease

## 2019-08-07 NOTE — Telephone Encounter (Signed)
Follow Up:     Pt said Richardson Dopp called her this morning and to stop taking a medicine. She can not remember the name of the medicine.

## 2019-08-07 NOTE — Telephone Encounter (Signed)
S/w pt no phone note in chart stating this office called pt this am.  Confirmed last phone conversation 11/17 and pt was to D/C ASA and stay on plavix.  Pt will call PCP to see if a message was left by PCP.

## 2019-08-10 DIAGNOSIS — H40013 Open angle with borderline findings, low risk, bilateral: Secondary | ICD-10-CM | POA: Diagnosis not present

## 2019-08-10 DIAGNOSIS — H04123 Dry eye syndrome of bilateral lacrimal glands: Secondary | ICD-10-CM | POA: Diagnosis not present

## 2019-08-10 LAB — HM DIABETES EYE EXAM

## 2019-08-29 ENCOUNTER — Other Ambulatory Visit: Payer: Self-pay

## 2019-08-29 DIAGNOSIS — Z20822 Contact with and (suspected) exposure to covid-19: Secondary | ICD-10-CM | POA: Diagnosis not present

## 2019-08-30 LAB — NOVEL CORONAVIRUS, NAA: SARS-CoV-2, NAA: DETECTED — AB

## 2019-08-31 ENCOUNTER — Emergency Department (HOSPITAL_COMMUNITY): Payer: Medicare Other

## 2019-08-31 ENCOUNTER — Other Ambulatory Visit: Payer: Self-pay

## 2019-08-31 ENCOUNTER — Inpatient Hospital Stay (HOSPITAL_COMMUNITY)
Admission: EM | Admit: 2019-08-31 | Discharge: 2019-09-05 | DRG: 637 | Disposition: A | Discharge status: Home / Self Care | Payer: Medicare Other | Attending: Student | Admitting: Student

## 2019-08-31 ENCOUNTER — Encounter (HOSPITAL_COMMUNITY): Payer: Self-pay

## 2019-08-31 DIAGNOSIS — Z87891 Personal history of nicotine dependence: Secondary | ICD-10-CM

## 2019-08-31 DIAGNOSIS — E039 Hypothyroidism, unspecified: Secondary | ICD-10-CM | POA: Diagnosis present

## 2019-08-31 DIAGNOSIS — N179 Acute kidney failure, unspecified: Secondary | ICD-10-CM

## 2019-08-31 DIAGNOSIS — J45909 Unspecified asthma, uncomplicated: Secondary | ICD-10-CM | POA: Diagnosis present

## 2019-08-31 DIAGNOSIS — Z79890 Hormone replacement therapy (postmenopausal): Secondary | ICD-10-CM

## 2019-08-31 DIAGNOSIS — R451 Restlessness and agitation: Secondary | ICD-10-CM | POA: Diagnosis present

## 2019-08-31 DIAGNOSIS — E118 Type 2 diabetes mellitus with unspecified complications: Secondary | ICD-10-CM

## 2019-08-31 DIAGNOSIS — Z7902 Long term (current) use of antithrombotics/antiplatelets: Secondary | ICD-10-CM

## 2019-08-31 DIAGNOSIS — I2510 Atherosclerotic heart disease of native coronary artery without angina pectoris: Secondary | ICD-10-CM | POA: Diagnosis not present

## 2019-08-31 DIAGNOSIS — E1110 Type 2 diabetes mellitus with ketoacidosis without coma: Principal | ICD-10-CM | POA: Diagnosis present

## 2019-08-31 DIAGNOSIS — E785 Hyperlipidemia, unspecified: Secondary | ICD-10-CM | POA: Diagnosis present

## 2019-08-31 DIAGNOSIS — I129 Hypertensive chronic kidney disease with stage 1 through stage 4 chronic kidney disease, or unspecified chronic kidney disease: Secondary | ICD-10-CM | POA: Diagnosis present

## 2019-08-31 DIAGNOSIS — R0609 Other forms of dyspnea: Secondary | ICD-10-CM | POA: Diagnosis present

## 2019-08-31 DIAGNOSIS — E870 Hyperosmolality and hypernatremia: Secondary | ICD-10-CM | POA: Diagnosis not present

## 2019-08-31 DIAGNOSIS — E860 Dehydration: Secondary | ICD-10-CM | POA: Diagnosis present

## 2019-08-31 DIAGNOSIS — R531 Weakness: Secondary | ICD-10-CM | POA: Diagnosis not present

## 2019-08-31 DIAGNOSIS — E872 Acidosis: Secondary | ICD-10-CM | POA: Diagnosis not present

## 2019-08-31 DIAGNOSIS — U071 COVID-19: Secondary | ICD-10-CM | POA: Diagnosis present

## 2019-08-31 DIAGNOSIS — E1122 Type 2 diabetes mellitus with diabetic chronic kidney disease: Secondary | ICD-10-CM | POA: Diagnosis not present

## 2019-08-31 DIAGNOSIS — E1310 Other specified diabetes mellitus with ketoacidosis without coma: Secondary | ICD-10-CM

## 2019-08-31 DIAGNOSIS — Z8249 Family history of ischemic heart disease and other diseases of the circulatory system: Secondary | ICD-10-CM | POA: Diagnosis not present

## 2019-08-31 DIAGNOSIS — Z833 Family history of diabetes mellitus: Secondary | ICD-10-CM

## 2019-08-31 DIAGNOSIS — M1990 Unspecified osteoarthritis, unspecified site: Secondary | ICD-10-CM | POA: Diagnosis not present

## 2019-08-31 DIAGNOSIS — I10 Essential (primary) hypertension: Secondary | ICD-10-CM | POA: Diagnosis present

## 2019-08-31 DIAGNOSIS — G9341 Metabolic encephalopathy: Secondary | ICD-10-CM | POA: Diagnosis present

## 2019-08-31 DIAGNOSIS — D479 Neoplasm of uncertain behavior of lymphoid, hematopoietic and related tissue, unspecified: Secondary | ICD-10-CM | POA: Diagnosis present

## 2019-08-31 DIAGNOSIS — E875 Hyperkalemia: Secondary | ICD-10-CM | POA: Diagnosis not present

## 2019-08-31 DIAGNOSIS — Z9114 Patient's other noncompliance with medication regimen: Secondary | ICD-10-CM

## 2019-08-31 DIAGNOSIS — I252 Old myocardial infarction: Secondary | ICD-10-CM

## 2019-08-31 DIAGNOSIS — N1831 Chronic kidney disease, stage 3a: Secondary | ICD-10-CM | POA: Diagnosis not present

## 2019-08-31 DIAGNOSIS — R739 Hyperglycemia, unspecified: Secondary | ICD-10-CM | POA: Diagnosis not present

## 2019-08-31 DIAGNOSIS — Z79899 Other long term (current) drug therapy: Secondary | ICD-10-CM

## 2019-08-31 DIAGNOSIS — Z794 Long term (current) use of insulin: Secondary | ICD-10-CM

## 2019-08-31 DIAGNOSIS — Z7982 Long term (current) use of aspirin: Secondary | ICD-10-CM

## 2019-08-31 DIAGNOSIS — E1165 Type 2 diabetes mellitus with hyperglycemia: Secondary | ICD-10-CM | POA: Diagnosis not present

## 2019-08-31 HISTORY — DX: Type 2 diabetes mellitus with ketoacidosis without coma: E11.10

## 2019-08-31 LAB — URINALYSIS, ROUTINE W REFLEX MICROSCOPIC
Bilirubin Urine: NEGATIVE
Glucose, UA: >=500 mg/dL — AB
Ketones, ur: 5 mg/dL — AB
Leukocytes,Ua: NEGATIVE
Nitrite: NEGATIVE
Protein, ur: 30 mg/dL — AB
Specific Gravity, Urine: 1.014 (ref 1.005–1.030)
pH: 5 (ref 5.0–8.0)

## 2019-08-31 LAB — COMPREHENSIVE METABOLIC PANEL
ALT: 32 U/L (ref 0–44)
AST: 35 U/L (ref 15–41)
Albumin: 3.6 g/dL (ref 3.5–5.0)
Alkaline Phosphatase: 69 U/L (ref 38–126)
Anion gap: 18 — ABNORMAL HIGH (ref 5–15)
BUN: 61 mg/dL — ABNORMAL HIGH (ref 8–23)
CO2: 17 mmol/L — ABNORMAL LOW (ref 22–32)
Calcium: 10 mg/dL (ref 8.9–10.3)
Chloride: 102 mmol/L (ref 98–111)
Creatinine, Ser: 2.45 mg/dL — ABNORMAL HIGH (ref 0.44–1.00)
GFR calc Af Amer: 21 mL/min — ABNORMAL LOW (ref >60)
GFR calc non Af Amer: 18 mL/min — ABNORMAL LOW (ref >60)
Glucose, Bld: 680 mg/dL (ref 70–99)
Potassium: 5.7 mmol/L — ABNORMAL HIGH (ref 3.5–5.1)
Sodium: 137 mmol/L (ref 135–145)
Total Bilirubin: 0.9 mg/dL (ref 0.3–1.2)
Total Protein: 8.3 g/dL — ABNORMAL HIGH (ref 6.5–8.1)

## 2019-08-31 LAB — CBC WITH DIFFERENTIAL/PLATELET
Abs Immature Granulocytes: 0.04 10*3/uL (ref 0.00–0.07)
Basophils Absolute: 0 10*3/uL (ref 0.0–0.1)
Basophils Relative: 0 %
Eosinophils Absolute: 0 10*3/uL (ref 0.0–0.5)
Eosinophils Relative: 0 %
HCT: 43.5 % (ref 36.0–46.0)
Hemoglobin: 13.7 g/dL (ref 12.0–15.0)
Immature Granulocytes: 1 %
Lymphocytes Relative: 23 %
Lymphs Abs: 2 10*3/uL (ref 0.7–4.0)
MCH: 27.7 pg (ref 26.0–34.0)
MCHC: 31.5 g/dL (ref 30.0–36.0)
MCV: 87.9 fL (ref 80.0–100.0)
Monocytes Absolute: 0.8 10*3/uL (ref 0.1–1.0)
Monocytes Relative: 9 %
Neutro Abs: 6 10*3/uL (ref 1.7–7.7)
Neutrophils Relative %: 67 %
Platelets: 180 10*3/uL (ref 150–400)
RBC: 4.95 MIL/uL (ref 3.87–5.11)
RDW: 14.6 % (ref 11.5–15.5)
WBC: 8.9 10*3/uL (ref 4.0–10.5)
nRBC: 0 % (ref 0.0–0.2)

## 2019-08-31 LAB — BETA-HYDROXYBUTYRIC ACID
Beta-Hydroxybutyric Acid: 4.28 mmol/L — ABNORMAL HIGH (ref 0.05–0.27)
Beta-Hydroxybutyric Acid: 0.78 mmol/L — ABNORMAL HIGH (ref 0.05–0.27)

## 2019-08-31 LAB — BASIC METABOLIC PANEL
Anion gap: 9 (ref 5–15)
BUN: 42 mg/dL — ABNORMAL HIGH (ref 8–23)
CO2: 21 mmol/L — ABNORMAL LOW (ref 22–32)
Calcium: 8.9 mg/dL (ref 8.9–10.3)
Chloride: 116 mmol/L — ABNORMAL HIGH (ref 98–111)
Creatinine, Ser: 1.61 mg/dL — ABNORMAL HIGH (ref 0.44–1.00)
GFR calc Af Amer: 34 mL/min — ABNORMAL LOW (ref >60)
GFR calc non Af Amer: 29 mL/min — ABNORMAL LOW (ref >60)
Glucose, Bld: 272 mg/dL — ABNORMAL HIGH (ref 70–99)
Potassium: 4.8 mmol/L (ref 3.5–5.1)
Sodium: 146 mmol/L — ABNORMAL HIGH (ref 135–145)

## 2019-08-31 LAB — CBG MONITORING, ED
Glucose-Capillary: >600 mg/dL (ref 70–99)
Glucose-Capillary: 485 mg/dL — ABNORMAL HIGH (ref 70–99)
Glucose-Capillary: 444 mg/dL — ABNORMAL HIGH (ref 70–99)
Glucose-Capillary: 448 mg/dL — ABNORMAL HIGH (ref 70–99)
Glucose-Capillary: 342 mg/dL — ABNORMAL HIGH (ref 70–99)
Glucose-Capillary: 309 mg/dL — ABNORMAL HIGH (ref 70–99)
Glucose-Capillary: 259 mg/dL — ABNORMAL HIGH (ref 70–99)
Glucose-Capillary: 265 mg/dL — ABNORMAL HIGH (ref 70–99)
Glucose-Capillary: 311 mg/dL — ABNORMAL HIGH (ref 70–99)
Glucose-Capillary: 336 mg/dL — ABNORMAL HIGH (ref 70–99)

## 2019-08-31 LAB — LIPASE, BLOOD: Lipase: 33 U/L (ref 11–51)

## 2019-08-31 LAB — BLOOD GAS, VENOUS
Acid-base deficit: 7.3 mmol/L — ABNORMAL HIGH (ref 0.0–2.0)
Bicarbonate: 19.2 mmol/L — ABNORMAL LOW (ref 20.0–28.0)
O2 Saturation: 21.5 %
Patient temperature: 98.6
pCO2, Ven: 44.7 mmHg (ref 44.0–60.0)
pH, Ven: 7.257 (ref 7.250–7.430)
pO2, Ven: <31 mmHg — CL (ref 32.0–45.0)

## 2019-08-31 MED ORDER — AMLODIPINE BESYLATE 5 MG PO TABS
5.0000 mg | ORAL_TABLET | Freq: Every day | ORAL | Status: DC
  Administered 2019-08-31 – 2019-09-02 (×3): 5 mg via ORAL
  Filled 2019-08-31 (×3): qty 1

## 2019-08-31 MED ORDER — NITROGLYCERIN 0.4 MG SL SUBL: 0.4000 mg | SUBLINGUAL_TABLET | SUBLINGUAL | Status: DC | PRN

## 2019-08-31 MED ORDER — ROSUVASTATIN CALCIUM 20 MG PO TABS
20.0000 mg | ORAL_TABLET | Freq: Every day | ORAL | Status: DC
  Administered 2019-09-01 – 2019-09-05 (×5): 20 mg via ORAL
  Filled 2019-08-31 (×5): qty 1

## 2019-08-31 MED ORDER — SODIUM CHLORIDE 0.9 % IV BOLUS
1000.0000 mL | Freq: Once | INTRAVENOUS | Status: AC
  Administered 2019-08-31: 1000 mL via INTRAVENOUS

## 2019-08-31 MED ORDER — MECLIZINE HCL 25 MG PO TABS
25.0000 mg | ORAL_TABLET | Freq: Every day | ORAL | Status: DC | PRN
  Filled 2019-08-31: qty 1

## 2019-08-31 MED ORDER — LIFITEGRAST 5 % OP SOLN: 2.0000 [drp] | Freq: Two times a day (BID) | OPHTHALMIC | Status: DC

## 2019-08-31 MED ORDER — INSULIN REGULAR(HUMAN) IN NACL 100-0.9 UT/100ML-% IV SOLN
INTRAVENOUS | Status: AC
  Administered 2019-08-31: 8.5 [IU]/h via INTRAVENOUS
  Filled 2019-08-31 (×2): qty 100

## 2019-08-31 MED ORDER — SODIUM CHLORIDE 0.9 % IV SOLN
100.0000 mg | Freq: Every day | INTRAVENOUS | Status: AC
  Administered 2019-09-01 – 2019-09-04 (×4): 100 mg via INTRAVENOUS
  Filled 2019-08-31 (×4): qty 100

## 2019-08-31 MED ORDER — ONDANSETRON HCL 4 MG PO TABS: 4.0000 mg | ORAL_TABLET | Freq: Four times a day (QID) | ORAL | Status: DC | PRN

## 2019-08-31 MED ORDER — METOPROLOL SUCCINATE ER 25 MG PO TB24
25.0000 mg | ORAL_TABLET | Freq: Every day | ORAL | Status: DC
  Administered 2019-09-01 – 2019-09-05 (×5): 25 mg via ORAL
  Filled 2019-08-31 (×5): qty 1

## 2019-08-31 MED ORDER — ASPIRIN EC 81 MG PO TBEC
81.0000 mg | DELAYED_RELEASE_TABLET | Freq: Every day | ORAL | Status: DC
  Administered 2019-08-31 – 2019-09-05 (×6): 81 mg via ORAL
  Filled 2019-08-31 (×6): qty 1

## 2019-08-31 MED ORDER — CLOPIDOGREL BISULFATE 75 MG PO TABS
75.0000 mg | ORAL_TABLET | Freq: Every day | ORAL | Status: DC
  Administered 2019-09-01 – 2019-09-05 (×5): 75 mg via ORAL
  Filled 2019-08-31 (×5): qty 1

## 2019-08-31 MED ORDER — ACETAMINOPHEN 500 MG PO TABS: 500.0000 mg | ORAL_TABLET | Freq: Four times a day (QID) | ORAL | Status: DC | PRN

## 2019-08-31 MED ORDER — SODIUM CHLORIDE 0.9 % IV SOLN
200.0000 mg | Freq: Once | INTRAVENOUS | Status: AC
  Administered 2019-08-31: 200 mg via INTRAVENOUS
  Filled 2019-08-31: qty 200

## 2019-08-31 MED ORDER — VITAMIN D3 1.25 MG (50000 UT) PO CAPS
50000.0000 [IU] | ORAL_CAPSULE | ORAL | Status: DC
  Filled 2019-08-31: qty 1

## 2019-08-31 MED ORDER — LEVOTHYROXINE SODIUM 50 MCG PO TABS
50.0000 ug | ORAL_TABLET | Freq: Every day | ORAL | Status: DC
  Administered 2019-09-02 – 2019-09-05 (×4): 50 ug via ORAL
  Filled 2019-08-31 (×5): qty 1

## 2019-08-31 MED ORDER — SODIUM CHLORIDE 0.9 % IV SOLN: INTRAVENOUS | Status: DC

## 2019-08-31 MED ORDER — CYCLOSPORINE 0.05 % OP EMUL
1.0000 [drp] | Freq: Two times a day (BID) | OPHTHALMIC | Status: DC
  Administered 2019-08-31 – 2019-09-05 (×9): 1 [drp] via OPHTHALMIC
  Filled 2019-08-31 (×10): qty 30

## 2019-08-31 MED ORDER — DEXTROSE-NACL 5-0.45 % IV SOLN: INTRAVENOUS | Status: DC

## 2019-08-31 MED ORDER — ONDANSETRON HCL 4 MG/2ML IJ SOLN: 4.0000 mg | Freq: Four times a day (QID) | INTRAMUSCULAR | Status: DC | PRN

## 2019-08-31 MED ORDER — DEXTROSE 50 % IV SOLN: 0.0000 mL | INTRAVENOUS | Status: DC | PRN

## 2019-08-31 MED ORDER — ENOXAPARIN SODIUM 40 MG/0.4ML ~~LOC~~ SOLN: 40.0000 mg | SUBCUTANEOUS | Status: DC

## 2019-08-31 MED ORDER — UMECLIDINIUM-VILANTEROL 62.5-25 MCG/INH IN AEPB
1.0000 | INHALATION_SPRAY | Freq: Every day | RESPIRATORY_TRACT | Status: DC
  Administered 2019-09-01 – 2019-09-05 (×5): 1 via RESPIRATORY_TRACT
  Filled 2019-08-31: qty 14

## 2019-08-31 NOTE — ED Triage Notes (Signed)
Pt COVID+, pt has had generalized fatigue and weakness. Pt has been hyperglycemic as well, with sugars over 500.  Pt was SHOB over the weekend, but that has resolved.

## 2019-08-31 NOTE — H&P (Addendum)
History and Physical    DOA: 08/31/2019  PCP: Lucianne Lei, MD  Patient coming from: Home  Chief Complaint:   HPI: [name] is a 85 y.o. female with history h/o diabetes mellitus, hypertension, CAD, asthma who lives with her sister and recently tested positive for Covid (on 1/5) presents to the ED with complaints of generalized weakness, fatigue and uncontrolled blood glucose levels.  She also reports dyspnea on exertion for a week now.  Quite independent at baseline, lives alone performs all ADLs and also drives.  She denies any chest pain, headache or visual changes.  She does report loss of taste/smell, poor appetite and has not been eating well lately. she does report noncompliance with her insulin regimen (per home med lists Lantus 30 units but she reports she takes 120 units usually) over the last week.  2 days back her glucometer showed "too high" blood glucose reading. Her blood glucose was elevated in the 500s yesterday and she called her PCP who advised her to take Lantus along with Trulicity/Metformin but her blood glucose was persistently elevated and she presented to the ED.  ED work-up: Afebrile, respiratory rate 16-25, blood pressure 150/69, O2 sat 91 to 92% at rest on room air but according to the nurse did drop to 88% when she fell asleep, lab work revealed sodium 137, potassium 5.7, bicarb 17, glucose 680, BUN 61, creatinine 2.45, anion gap 18, lipase 33, VBG with pH 7.25, bicarb 19.2. Beta hydroxybutyrate elevated at 4.28.  CBC within normal limits.  Chest x-ray without any infiltrates.  UA pending.  EKG normal sinus rhythm with LVH pattern, QTc 440 ms   Review of Systems: As per HPI otherwise 10 point review of systems negative.    Past Medical History:  Diagnosis Date  . Arthritis   . Asthma   . CAD (coronary artery disease)    s/p NSTEMI 10/19 - [facility]:  pRCA 99, oLCx 30 >> DES to RCA; Echo 10/19: Mild focal basal septal hypertrophy, EF 60-65, inf-lat, inf and  inf-sept HK, trivial MR, trivial TR  . Diabetes mellitus without complication ([place])   . Hypertension     Past Surgical History:  Procedure Laterality Date  . ABDOMINAL HYSTERECTOMY  "many yrs ago"  . AXILLARY LYMPH NODE BIOPSY Right 02/26/2015   Procedure: RIGHT AXILLARY LYMPH NODE BIOPSY;  Surgeon: Jackolyn Confer, MD;  Location: [facility];  Service: General;  Laterality: Right;  . CHOLECYSTECTOMY    . CORONARY STENT INTERVENTION N/A 06/15/2018   Procedure: CORONARY STENT INTERVENTION;  Surgeon: Wellington Hampshire, MD;  Location: [place] [facility];  Service: Cardiovascular;  Laterality: N/A;  . LEFT HEART CATH AND CORONARY ANGIOGRAPHY N/A 06/15/2018   Procedure: LEFT HEART CATH AND CORONARY ANGIOGRAPHY;  Surgeon: Wellington Hampshire, MD;  Location: [place] [facility];  Service: Cardiovascular;  Laterality: N/A;    Social history:  reports that she has quit smoking. She has a 30.00 pack-year smoking history. She has never used smokeless tobacco. She reports that she does not drink alcohol or use drugs.   No Known Allergies  Family History  Problem Relation Age of Onset  . Diabetes Mother   . Heart disease Mother   . Diabetes Father   . Diabetes Sister   . Heart disease Sister   . Diabetes Brother       Prior to Admission medications   Medication Sig Start Date End Date Taking? Authorizing Provider  acetaminophen (TYLENOL) 325 MG tablet Take by  mouth every 6 (six) hours as needed for mild pain.   Yes [provider]  amLODipine-olmesartan (AZOR) 5-20 MG per tablet Take 1 tablet by mouth every morning.    Yes [provider]  aspirin EC 81 MG tablet Take 81 mg by mouth daily.   Yes [provider]  Cholecalciferol (VITAMIN D3) 50000 units CAPS Take 50,000 Units by mouth once a week. On Friday 04/09/18  Yes [provider]  clopidogrel (PLAVIX) 75 MG tablet Take 1 tablet by mouth once daily with breakfast Patient taking differently: Take 75 mg by  mouth daily.  06/23/19  Yes Weaver, Scott T, PA-C  Dulaglutide (TRULICITY) 4.40 HK/7.4QV SOPN Inject 0.75 mg into the skin once a week. Friday   Yes [provider]  insulin glargine (LANTUS) 100 unit/mL SOPN Inject 30 Units into the skin at bedtime.    Yes [provider]  levothyroxine (SYNTHROID, LEVOTHROID) 50 MCG tablet Take 50 mcg by mouth daily.  01/05/17  Yes [provider]  meclizine (ANTIVERT) 25 MG tablet Take 25 mg by mouth daily as needed for dizziness.  08/22/19  Yes [provider]  metFORMIN (GLUCOPHAGE) 500 MG tablet Take 1,000 mg by mouth at bedtime.    Yes [provider]  metoprolol succinate (TOPROL-XL) 25 MG 24 hr tablet Take 1 tablet by mouth once daily Patient taking differently: Take 25 mg by mouth daily.  06/28/19  Yes Weaver, Scott T, PA-C  nitroGLYCERIN (NITROSTAT) 0.4 MG SL tablet Place 1 tablet (0.4 mg total) under the tongue every 5 (five) minutes x 3 doses as needed for chest pain. 06/16/18  Yes Reino Bellis B, NP  RESTASIS MULTIDOSE 0.05 % ophthalmic emulsion Place 1 drop into both eyes 2 (two) times daily.  03/01/17  Yes [provider]  rosuvastatin (CRESTOR) 20 MG tablet Take 1 tablet by mouth once daily Patient taking differently: Take 20 mg by mouth daily.  08/01/19  Yes Weaver, Scott T, PA-C  Umeclidinium-Vilanterol (ANORO ELLIPTA) 62.5-25 MCG/INH AEPB Inhale 1 puff into the lungs daily.    Yes [provider]  XIIDRA 5 % SOLN Place 2 drops into both eyes 2 (two) times daily.  08/10/19  Yes [provider]  ACCU-CHEK FASTCLIX LANCETS MISC USE AS DIRECTED FOR DAILY TESTING 09/24/18   [provider]  ACCU-CHEK GUIDE test strip daily. use as directed 09/10/18   [provider]  acetaminophen (TYLENOL 8 HOUR) 650 MG CR tablet Take 1 tablet (650 mg total) by mouth every 8 (eight) hours as needed for pain. Patient not taking: Reported on 08/31/2019 09/28/18   Richardson Dopp T, PA-C     Physical Exam: Vitals:   08/31/19 1104 08/31/19 1107 08/31/19 1315  BP: (!) 158/74  (!) 150/69  Pulse: 92  75  Resp: 16  (!) 25  Temp:  97.6 F (36.4 C)   TempSrc:  Oral   SpO2: 91%  92%    Constitutional: NAD, calm, comfortable Eyes: PERRL, lids and conjunctivae normal ENMT: Mucous membranes are moist. Posterior pharynx clear of any exudate or lesions.Normal dentition.  Neck: normal, supple, no masses, no thyromegaly Respiratory: clear to auscultation bilaterally, no wheezing, no crackles. Normal respiratory effort. No accessory muscle use.  Cardiovascular: Regular rate and rhythm, no murmurs / rubs / gallops. No extremity edema. 2+ pedal pulses. No carotid bruits.  Abdomen: no tenderness, no masses palpated. No hepatosplenomegaly. Bowel sounds positive.  Musculoskeletal: no clubbing / cyanosis. No joint deformity upper and lower  extremities. Good ROM, no contractures. Normal muscle tone.  Neurologic: CN 2-12 grossly intact. Sensation intact, DTR normal. Strength 5/5 in all 4.  Psychiatric: Normal judgment and insight. Alert and oriented x 3. Normal mood.  SKIN/catheters: no rashes, lesions, ulcers. No induration  Labs on Admission: I have personally reviewed following labs and imaging studies  CBC: Recent Labs  Lab 08/31/19 1141  WBC 8.9  NEUTROABS 6.0  HGB 13.7  HCT 43.5  MCV 87.9  PLT 194   Basic Metabolic Panel: Recent Labs  Lab 08/31/19 1141  NA 137  K 5.7*  CL 102  CO2 17*  GLUCOSE 680*  BUN 61*  CREATININE 2.45*  CALCIUM 10.0   GFR: CrCl cannot be calculated (Unknown ideal weight.). Liver Function Tests: Recent Labs  Lab 08/31/19 1141  AST 35  ALT 32  ALKPHOS 69  BILITOT 0.9  PROT 8.3*  ALBUMIN 3.6   Recent Labs  Lab 08/31/19 1141  LIPASE 33   No results for input(s): AMMONIA in the last 168 hours. Coagulation Profile: No results for input(s): INR, PROTIME in the last 168 hours. Cardiac Enzymes: No results for input(s): CKTOTAL,  CKMB, CKMBINDEX, TROPONINI in the last 168 hours. BNP (last 3 results) No results for input(s): PROBNP in the last 8760 hours. HbA1C: No results for input(s): HGBA1C in the last 72 hours. CBG: Recent Labs  Lab 08/31/19 1100 08/31/19 1322  GLUCAP >600* 485*   Lipid Profile: No results for input(s): CHOL, HDL, LDLCALC, TRIG, CHOLHDL, LDLDIRECT in the last 72 hours. Thyroid Function Tests: No results for input(s): TSH, T4TOTAL, FREET4, T3FREE, THYROIDAB in the last 72 hours. Anemia Panel: No results for input(s): VITAMINB12, FOLATE, FERRITIN, TIBC, IRON, RETICCTPCT in the last 72 hours. Urine analysis:   Radiological Exams on Admission: Personally reviewed  DG Chest Portable 1 View  Result Date: 08/31/2019 CLINICAL DATA:  COVID positive, hyperglycemia EXAM: PORTABLE CHEST 1 VIEW COMPARISON:  06/14/2018 FINDINGS: The heart size and mediastinal contours are within normal limits. Both lungs are clear. No pleural effusion. The visualized skeletal structures are unremarkable. IMPRESSION: No acute process in the chest. Electronically Signed   By: Macy Mis M.D.   On: 08/31/2019 12:18    EKG: Independently reviewed.  Normal sinus rhythm with QTC 440 ms     Assessment and Plan:   Principal Problem:   DKA (diabetic ketoacidoses) ([place]) Active Problems:   COVID-19 virus infection   AKI (acute kidney injury) ([place])   CAD (coronary artery disease)   Hypertension   Hyperlipidemia   Type 2 diabetes mellitus with complication, with long-term current use of insulin ([place])    1.  DKA: Likely precipitated by problem #2 and associated poor oral intake.  Claims compliance with medications.  Initiated on Endo tool in ED.  Will continue until the gap closes and transition to Lantus subcu insulin when appropriate.  Repeat labs including BMP/BHA in a.m.  Hold Metformin for now  2.  COVID-19 illness with mild hypoxic respiratory insufficiency: No evidence of infiltrates on chest x-ray.  Able to  maintain O2 sat in low 90s on room air at rest but reports exertional dyspnea for a week and also noted to drop her O2 sats occasionally to 88%.  Currently satting well on 2 L O2.  Will avoid steroids given problem #1 for now, continue to monitor closely.  Add remdesivir.  MDI as needed  3.  Acute renal failure on CKD stage IIIa: Patient's baseline creatinine appears to be around 1-1.2.  Currently elevated in the setting of poor oral intake and dehydration.  Hold ACE inhibitors, IV fluids and labs in a.m.  4.  Hypertension: Resume amlodipine, metoprolol, hold olmesartan  5.  Mild hyperkalemia: Should improve with insulin drip and IV fluids.  Hold olmesartan  6.  CAD: Patient has history of non-STEMI in October 2019 with DES to RCA.  Resume aspirin, Plavix, beta-blockers.  Sublingual nitro as needed available.    7. Hyperlipidemia: Resume Crestor  8.  Asthma: Stable.  Resume MDI as needed  9.  Generalized weakness: Likely multifactorial in the setting of problem #1, 2 and 3.  Treat underlying condition.  PT eval   DVT prophylaxis: Lovenox  COVID screen: Positive on 1/5  Code Status: Full code.Health care proxy would be daughter Juliann Pulse  Patient/Family Communication: Discussed with patient and all questions answered to satisfaction.  Consults called: None Admission status :I certify that at the point of admission it is my clinical judgment that the patient will require inpatient hospital care spanning beyond 2 midnights from the point of admission due to high intensity of service and high frequency of surveillance required.Inpatient status is judged to be reasonable and necessary in order to provide the required intensity of service to ensure the patient's safety. The patient's presenting symptoms, physical exam findings, and initial radiographic and laboratory data in the context of their chronic comorbidities is felt to place them at high risk for further clinical deterioration. The following  factors support the patient status of inpatient : DKA and AKI requiring IV insulin/IV fluids Expected LOS: 3 days    Guilford Shi MD [facility] Pager [phone]  If 7PM-7AM, please contact night-coverage [email or web address] Password [email or web address]  08/31/2019, 2:25 PM

## 2019-08-31 NOTE — ED Notes (Addendum)
This RN went into patient's room to get her hourly blood glucose level. Upon entering the room, the patient had removed her gown and IV. This RN attempted to start a new IV twice. MD Kamineni notified of situation.

## 2019-08-31 NOTE — ED Provider Notes (Signed)
[place] DEPT Provider Note   CSN: [id number] Arrival date & time: 08/31/19  1045     History Chief Complaint  Patient presents with  . Hyperglycemia  . COVID+    [name] is a 85 y.o. female.  The history is provided by the patient.  Hyperglycemia Blood sugar level PTA:  >600 Severity:  Moderate Onset quality:  Gradual Timing:  Constant Progression:  Unchanged Chronicity:  New Diabetes status:  Controlled with insulin and controlled with oral medications Current diabetic therapy:  Metformin, lantus Context: recent illness   Context: not noncompliance  Recent change in diet: COVID positive, poor appetitie.   Relieved by:  Insulin Associated symptoms: dehydration, fatigue and weakness   Associated symptoms: no abdominal pain, no chest pain, no confusion, no dizziness, no dysuria, no fever, no shortness of breath and no vomiting        Past Medical History:  Diagnosis Date  . Arthritis   . Asthma   . CAD (coronary artery disease)    s/p NSTEMI 10/19 - LHC:  pRCA 99, oLCx 30 >> DES to RCA; Echo 10/19: Mild focal basal septal hypertrophy, EF 60-65, inf-lat, inf and inf-sept HK, trivial MR, trivial TR  . Diabetes mellitus without complication ([place])   . Hypertension     Patient Active Problem List   Diagnosis Date Noted  . CAD (coronary artery disease) 09/27/2018  . Hypertension 06/16/2018  . Hyperlipidemia 06/16/2018  . Type 2 diabetes mellitus with complication, with long-term current use of insulin ([place]) 06/16/2018  . Hx of NSTEMI 10/19 Tx with DES to [facility] 06/14/2018  . Lymphoproliferative disease ([place]) 10/03/2015    Past Surgical History:  Procedure Laterality Date  . ABDOMINAL HYSTERECTOMY  "many yrs ago"  . AXILLARY LYMPH NODE BIOPSY Right 02/26/2015   Procedure: RIGHT AXILLARY LYMPH NODE BIOPSY;  Surgeon: Jackolyn Confer, MD;  Location: [facility] OR[facility];  Service: General;  Laterality: Right;  . CHOLECYSTECTOMY    .  CORONARY STENT INTERVENTION N/A 06/15/2018   Procedure: CORONARY STENT INTERVENTION;  Surgeon: Wellington Hampshire, MD;  Location: [place] CV LAB;  Service: Cardiovascular;  Laterality: N/A;  . LEFT HEART CATH AND CORONARY ANGIOGRAPHY N/A 06/15/2018   Procedure: LEFT HEART CATH AND CORONARY ANGIOGRAPHY;  Surgeon: Wellington Hampshire, MD;  Location: [place] CV LAB;  Service: Cardiovascular;  Laterality: N/A;     OB History   No obstetric history on file.     Family History  Problem Relation Age of Onset  . Diabetes Mother   . Heart disease Mother   . Diabetes Father   . Diabetes Sister   . Heart disease Sister   . Diabetes Brother     Social History   Tobacco Use  . Smoking status: Former Smoker    Packs/day: 0.50    Years: 60.00    Pack years: 30.00  . Smokeless tobacco: Never Used  Substance Use Topics  . Alcohol use: No  . Drug use: No    Home Medications Prior to Admission medications   Medication Sig Start Date End Date Taking? Authorizing Provider  acetaminophen (TYLENOL) 325 MG tablet Take by mouth every 6 (six) hours as needed for mild pain.   Yes [provider]  amLODipine-olmesartan (AZOR) 5-20 MG per tablet Take 1 tablet by mouth every morning.    Yes [provider]  aspirin EC 81 MG tablet Take 81 mg by mouth daily.   Yes [provider]  Cholecalciferol (  VITAMIN D3) 50000 units CAPS Take 50,000 Units by mouth once a week. On Friday 04/09/18  Yes [provider]  clopidogrel (PLAVIX) 75 MG tablet Take 1 tablet by mouth once daily with breakfast Patient taking differently: Take 75 mg by mouth daily.  06/23/19  Yes Weaver, Scott T, PA-C  Dulaglutide (TRULICITY) 7.98 XQ/1.1HE SOPN Inject 0.75 mg into the skin once a week. Friday   Yes [provider]  insulin glargine (LANTUS) 100 unit/mL SOPN Inject 30 Units into the skin at bedtime.    Yes [provider]  levothyroxine (SYNTHROID, LEVOTHROID) 50 MCG  tablet Take 50 mcg by mouth daily.  01/05/17  Yes [provider]  meclizine (ANTIVERT) 25 MG tablet Take 25 mg by mouth daily as needed for dizziness.  08/22/19  Yes [provider]  metFORMIN (GLUCOPHAGE) 500 MG tablet Take 1,000 mg by mouth at bedtime.    Yes [provider]  metoprolol succinate (TOPROL-XL) 25 MG 24 hr tablet Take 1 tablet by mouth once daily Patient taking differently: Take 25 mg by mouth daily.  06/28/19  Yes Weaver, Scott T, PA-C  nitroGLYCERIN (NITROSTAT) 0.4 MG SL tablet Place 1 tablet (0.4 mg total) under the tongue every 5 (five) minutes x 3 doses as needed for chest pain. 06/16/18  Yes Reino Bellis B, NP  RESTASIS MULTIDOSE 0.05 % ophthalmic emulsion Place 1 drop into both eyes 2 (two) times daily.  03/01/17  Yes [provider]  rosuvastatin (CRESTOR) 20 MG tablet Take 1 tablet by mouth once daily Patient taking differently: Take 20 mg by mouth daily.  08/01/19  Yes Weaver, Scott T, PA-C  Umeclidinium-Vilanterol (ANORO ELLIPTA) 62.5-25 MCG/INH AEPB Inhale 1 puff into the lungs daily.    Yes [provider]  XIIDRA 5 % SOLN Place 2 drops into both eyes 2 (two) times daily.  08/10/19  Yes [provider]  ACCU-CHEK FASTCLIX LANCETS MISC USE AS DIRECTED FOR DAILY TESTING 09/24/18   [provider]  ACCU-CHEK GUIDE test strip daily. use as directed 09/10/18   [provider]  acetaminophen (TYLENOL 8 HOUR) 650 MG CR tablet Take 1 tablet (650 mg total) by mouth every 8 (eight) hours as needed for pain. Patient not taking: Reported on 08/31/2019 09/28/18   Richardson Dopp T, PA-C    Allergies    Patient has no known allergies.  Review of Systems   Review of Systems  Constitutional: Positive for fatigue. Negative for chills and fever.  HENT: Negative for ear pain and sore throat.   Eyes: Negative for pain and visual disturbance.  Respiratory: Negative for cough and shortness of breath.   Cardiovascular:  Negative for chest pain and palpitations.  Gastrointestinal: Negative for abdominal pain and vomiting.  Genitourinary: Negative for dysuria and hematuria.  Musculoskeletal: Negative for arthralgias and back pain.  Skin: Negative for color change and rash.  Neurological: Positive for weakness. Negative for dizziness, seizures and syncope.  Psychiatric/Behavioral: Negative for confusion.  All other systems reviewed and are negative.   Physical Exam Updated Vital Signs  ED Triage Vitals  Enc Vitals Group     BP 08/31/19 1104 (!) 158/74     Pulse Rate 08/31/19 1104 92     Resp 08/31/19 1104 16     Temp 08/31/19 1107 97.6 F (36.4 C)     Temp Source 08/31/19 1107 Oral     SpO2 08/31/19 1104 91 %     Weight --  Height --      Head Circumference --      Peak Flow --      Pain Score 08/31/19 1104 0     Pain Loc --      Pain Edu? --      Excl. in [place]? --     Physical Exam Vitals and nursing note reviewed.  Constitutional:      General: She is not in acute distress.    Appearance: She is well-developed. She is not ill-appearing.  HENT:     Head: Normocephalic and atraumatic.     Mouth/Throat:     Mouth: Mucous membranes are dry.  Eyes:     Extraocular Movements: Extraocular movements intact.     Conjunctiva/sclera: Conjunctivae normal.     Pupils: Pupils are equal, round, and reactive to light.  Cardiovascular:     Rate and Rhythm: Normal rate and regular rhythm.     Pulses: Normal pulses.     Heart sounds: Normal heart sounds. No murmur.  Pulmonary:     Effort: Pulmonary effort is normal. No respiratory distress.     Breath sounds: Normal breath sounds.  Abdominal:     Palpations: Abdomen is soft.     Tenderness: There is no abdominal tenderness.  Musculoskeletal:     Cervical back: Normal range of motion and neck supple.     Right lower leg: No edema.     Left lower leg: No edema.  Skin:    General: Skin is warm and dry.  Neurological:     General: No focal  deficit present.     Mental Status: She is alert and oriented to person, place, and time.     Cranial Nerves: No cranial nerve deficit.     Sensory: No sensory deficit.     Motor: No weakness.     Coordination: Coordination normal.  Psychiatric:        Mood and Affect: Mood normal.     ED Results / Procedures / Treatments   Labs (all labs ordered are listed, but only abnormal results are displayed) Labs Reviewed  COMPREHENSIVE METABOLIC PANEL - Abnormal; Notable for the following components:      Result Value   Potassium 5.7 (*)    CO2 17 (*)    Glucose, Bld 680 (*)    BUN 61 (*)    Creatinine, Ser 2.45 (*)    Total Protein 8.3 (*)    GFR calc non Af Amer 18 (*)    GFR calc Af Amer 21 (*)    Anion gap 18 (*)    All other components within normal limits  CBG MONITORING, ED - Abnormal; Notable for the following components:   Glucose-Capillary >600 (*)    All other components within normal limits  CBC WITH DIFFERENTIAL/PLATELET  LIPASE, BLOOD  URINALYSIS, ROUTINE W REFLEX MICROSCOPIC  BETA-HYDROXYBUTYRIC ACID  BLOOD GAS, VENOUS    EKG EKG Interpretation  Date/Time:  Thursday August 31 2019 11:13:35 EST Ventricular Rate:  82 PR Interval:    QRS Duration: 97 QT Interval:  376 QTC Calculation: 440 R Axis:   1 Text Interpretation: Sinus rhythm Left ventricular hypertrophy Baseline wander in lead(s) II V1 V2 V3 Confirmed by Lennice Sites [phone]) on 08/31/2019 11:17:47 AM   Radiology DG Chest Portable 1 View  Result Date: 08/31/2019 CLINICAL DATA:  COVID positive, hyperglycemia EXAM: PORTABLE CHEST 1 VIEW COMPARISON:  06/14/2018 FINDINGS: The heart size and mediastinal contours are within normal limits. Both lungs  are clear. No pleural effusion. The visualized skeletal structures are unremarkable. IMPRESSION: No acute process in the chest. Electronically Signed   By: Macy Mis M.D.   On: 08/31/2019 12:18    Procedures .Critical Care Performed by: Lennice Sites,  DO Authorized by: Lennice Sites, DO   Critical care provider statement:    Critical care time (minutes):  40   Critical care was necessary to treat or prevent imminent or life-threatening deterioration of the following conditions:  Metabolic crisis   Critical care was time spent personally by me on the following activities:  Blood draw for specimens, development of treatment plan with patient or surrogate, discussions with primary provider, evaluation of patient's response to treatment, examination of patient, obtaining history from patient or surrogate, ordering and performing treatments and interventions, ordering and review of laboratory studies, ordering and review of radiographic studies, pulse oximetry, re-evaluation of patient's condition and review of old charts   I assumed direction of critical care for this patient from another provider in my specialty: no     (including critical care time)  Medications Ordered in ED Medications  sodium chloride 0.9 % bolus 1,000 mL (has no administration in time range)  insulin regular, human (MYXREDLIN) 100 units/ 100 mL infusion (has no administration in time range)  0.9 %  sodium chloride infusion (has no administration in time range)  dextrose 5 %-0.45 % sodium chloride infusion (has no administration in time range)  dextrose 50 % solution 0-50 mL (has no administration in time range)  sodium chloride 0.9 % bolus 1,000 mL (1,000 mLs Intravenous New Bag/Given 08/31/19 1142)    ED Course  I have reviewed the triage vital signs and the nursing notes.  Pertinent labs & imaging results that were available during my care of the patient were reviewed by me and considered in my medical decision making (see chart for details).    MDM Rules/Calculators/A&P  Jersie Beel is a 85 year old female with history of diabetes, hypertension, CAD who presents to the ED with fatigue, hyperglycemia.  Recently tested positive for coronavirus.  Denies any  respiratory symptoms.  Normal vitals.  No fever.  Has lost taste and smell.  Has not had any respiratory symptoms.  Blood sugar has been in the 500s despite Lantus and Metformin.  She has had poor appetite.  Increasing fatigue and weakness but does have good support at home with family helping to take care of her.  Patient appears clinically dehydrated.  Denies any nausea, vomiting, diarrhea.  Denies any pain with urination.  Denies any cough.  Will get labs to evaluate for DKA, dehydration, electrolyte abnormalities.  Will get chest x-ray.  EKG shows sinus rhythm.  No ischemic changes.  Will give fluid bolus and reevaluate after lab work.  Patient has already taken Metformin and Lantus last night.  Patient with glucose of 680, anion gap of 18, bicarb of 17.  Suspect mild DKA.  Started on insulin infusion.  Given additional normal saline bolus.  Creatinine is elevated at 2.45 suggesting dehydration as well.  Potassium is 5.7 but EKG reassuring.  Chest x-ray shows no signs of infection.  Patient to be admitted to hospitalist for further treatment.  Hemodynamically stable throughout my care.  This chart was dictated using voice recognition software.  Despite best efforts to proofread,  errors can occur which can change the documentation meaning.    Final Clinical Impression(s) / ED Diagnoses Final diagnoses:  Diabetic ketoacidosis without coma associated with other specified  diabetes mellitus ([place])  COVID-19  AKI (acute kidney injury) [facility])    Rx / DC Orders ED Discharge Orders    None       Lennice Sites, DO 08/31/19 1233

## 2019-09-01 ENCOUNTER — Inpatient Hospital Stay (HOSPITAL_COMMUNITY): Payer: Medicare Other

## 2019-09-01 DIAGNOSIS — E1110 Type 2 diabetes mellitus with ketoacidosis without coma: Principal | ICD-10-CM

## 2019-09-01 DIAGNOSIS — Z794 Long term (current) use of insulin: Secondary | ICD-10-CM

## 2019-09-01 DIAGNOSIS — E118 Type 2 diabetes mellitus with unspecified complications: Secondary | ICD-10-CM

## 2019-09-01 DIAGNOSIS — E870 Hyperosmolality and hypernatremia: Secondary | ICD-10-CM

## 2019-09-01 DIAGNOSIS — R531 Weakness: Secondary | ICD-10-CM

## 2019-09-01 DIAGNOSIS — G9341 Metabolic encephalopathy: Secondary | ICD-10-CM

## 2019-09-01 LAB — BASIC METABOLIC PANEL
Anion gap: 13 (ref 5–15)
Anion gap: 10 (ref 5–15)
Anion gap: 10 (ref 5–15)
Anion gap: 10 (ref 5–15)
BUN: 35 mg/dL — ABNORMAL HIGH (ref 8–23)
BUN: 32 mg/dL — ABNORMAL HIGH (ref 8–23)
BUN: 33 mg/dL — ABNORMAL HIGH (ref 8–23)
BUN: 34 mg/dL — ABNORMAL HIGH (ref 8–23)
CO2: 19 mmol/L — ABNORMAL LOW (ref 22–32)
CO2: 22 mmol/L (ref 22–32)
CO2: 21 mmol/L — ABNORMAL LOW (ref 22–32)
CO2: 21 mmol/L — ABNORMAL LOW (ref 22–32)
Calcium: 9.3 mg/dL (ref 8.9–10.3)
Calcium: 9.4 mg/dL (ref 8.9–10.3)
Calcium: 9.7 mg/dL (ref 8.9–10.3)
Calcium: 9.4 mg/dL (ref 8.9–10.3)
Chloride: 114 mmol/L — ABNORMAL HIGH (ref 98–111)
Chloride: 114 mmol/L — ABNORMAL HIGH (ref 98–111)
Chloride: 118 mmol/L — ABNORMAL HIGH (ref 98–111)
Chloride: 117 mmol/L — ABNORMAL HIGH (ref 98–111)
Creatinine, Ser: 1.56 mg/dL — ABNORMAL HIGH (ref 0.44–1.00)
Creatinine, Ser: 1.92 mg/dL — ABNORMAL HIGH (ref 0.44–1.00)
Creatinine, Ser: 1.77 mg/dL — ABNORMAL HIGH (ref 0.44–1.00)
Creatinine, Ser: 1.71 mg/dL — ABNORMAL HIGH (ref 0.44–1.00)
GFR calc Af Amer: 35 mL/min — ABNORMAL LOW (ref >60)
GFR calc Af Amer: 28 mL/min — ABNORMAL LOW (ref >60)
GFR calc Af Amer: 30 mL/min — ABNORMAL LOW (ref >60)
GFR calc Af Amer: 32 mL/min — ABNORMAL LOW (ref >60)
GFR calc non Af Amer: 31 mL/min — ABNORMAL LOW (ref >60)
GFR calc non Af Amer: 24 mL/min — ABNORMAL LOW (ref >60)
GFR calc non Af Amer: 26 mL/min — ABNORMAL LOW (ref >60)
GFR calc non Af Amer: 27 mL/min — ABNORMAL LOW (ref >60)
Glucose, Bld: 198 mg/dL — ABNORMAL HIGH (ref 70–99)
Glucose, Bld: 238 mg/dL — ABNORMAL HIGH (ref 70–99)
Glucose, Bld: 136 mg/dL — ABNORMAL HIGH (ref 70–99)
Glucose, Bld: 142 mg/dL — ABNORMAL HIGH (ref 70–99)
Potassium: 4 mmol/L (ref 3.5–5.1)
Potassium: 3.8 mmol/L (ref 3.5–5.1)
Potassium: 3.7 mmol/L (ref 3.5–5.1)
Potassium: 3.9 mmol/L (ref 3.5–5.1)
Sodium: 146 mmol/L — ABNORMAL HIGH (ref 135–145)
Sodium: 146 mmol/L — ABNORMAL HIGH (ref 135–145)
Sodium: 149 mmol/L — ABNORMAL HIGH (ref 135–145)
Sodium: 148 mmol/L — ABNORMAL HIGH (ref 135–145)

## 2019-09-01 LAB — HEPATIC FUNCTION PANEL
ALT: 28 U/L (ref 0–44)
AST: 43 U/L — ABNORMAL HIGH (ref 15–41)
Albumin: 3.2 g/dL — ABNORMAL LOW (ref 3.5–5.0)
Alkaline Phosphatase: 60 U/L (ref 38–126)
Bilirubin, Direct: 0.2 mg/dL (ref 0.0–0.2)
Indirect Bilirubin: 0.6 mg/dL (ref 0.3–0.9)
Total Bilirubin: 0.8 mg/dL (ref 0.3–1.2)
Total Protein: 7.4 g/dL (ref 6.5–8.1)

## 2019-09-01 LAB — FERRITIN: Ferritin: 391 ng/mL — ABNORMAL HIGH (ref 11–307)

## 2019-09-01 LAB — BRAIN NATRIURETIC PEPTIDE
B Natriuretic Peptide: 111.5 pg/mL — ABNORMAL HIGH (ref 0.0–100.0)
B Natriuretic Peptide: 96 pg/mL (ref 0.0–100.0)

## 2019-09-01 LAB — CBC
HCT: 37.7 % (ref 36.0–46.0)
Hemoglobin: 12.2 g/dL (ref 12.0–15.0)
MCH: 28.2 pg (ref 26.0–34.0)
MCHC: 32.4 g/dL (ref 30.0–36.0)
MCV: 87.3 fL (ref 80.0–100.0)
Platelets: 215 10*3/uL (ref 150–400)
RBC: 4.32 MIL/uL (ref 3.87–5.11)
RDW: 14.6 % (ref 11.5–15.5)
WBC: 11.3 10*3/uL — ABNORMAL HIGH (ref 4.0–10.5)
nRBC: 0 % (ref 0.0–0.2)

## 2019-09-01 LAB — CBG MONITORING, ED
Glucose-Capillary: 196 mg/dL — ABNORMAL HIGH (ref 70–99)
Glucose-Capillary: 221 mg/dL — ABNORMAL HIGH (ref 70–99)
Glucose-Capillary: 295 mg/dL — ABNORMAL HIGH (ref 70–99)
Glucose-Capillary: 307 mg/dL — ABNORMAL HIGH (ref 70–99)

## 2019-09-01 LAB — GLUCOSE, CAPILLARY
Glucose-Capillary: 150 mg/dL — ABNORMAL HIGH (ref 70–99)
Glucose-Capillary: 199 mg/dL — ABNORMAL HIGH (ref 70–99)
Glucose-Capillary: 226 mg/dL — ABNORMAL HIGH (ref 70–99)
Glucose-Capillary: 292 mg/dL — ABNORMAL HIGH (ref 70–99)
Glucose-Capillary: 289 mg/dL — ABNORMAL HIGH (ref 70–99)
Glucose-Capillary: 264 mg/dL — ABNORMAL HIGH (ref 70–99)
Glucose-Capillary: 193 mg/dL — ABNORMAL HIGH (ref 70–99)
Glucose-Capillary: 156 mg/dL — ABNORMAL HIGH (ref 70–99)
Glucose-Capillary: 144 mg/dL — ABNORMAL HIGH (ref 70–99)
Glucose-Capillary: 121 mg/dL — ABNORMAL HIGH (ref 70–99)
Glucose-Capillary: 98 mg/dL (ref 70–99)
Glucose-Capillary: 137 mg/dL — ABNORMAL HIGH (ref 70–99)
Glucose-Capillary: 132 mg/dL — ABNORMAL HIGH (ref 70–99)
Glucose-Capillary: 130 mg/dL — ABNORMAL HIGH (ref 70–99)

## 2019-09-01 LAB — TROPONIN I (HIGH SENSITIVITY)
Troponin I (High Sensitivity): 17 ng/L (ref <18)
Troponin I (High Sensitivity): 14 ng/L (ref <18)

## 2019-09-01 LAB — AMMONIA: Ammonia: 32 umol/L (ref 9–35)

## 2019-09-01 LAB — MRSA PCR SCREENING: MRSA by PCR: NEGATIVE

## 2019-09-01 LAB — LACTATE DEHYDROGENASE: LDH: 343 U/L — ABNORMAL HIGH (ref 98–192)

## 2019-09-01 LAB — VITAMIN B12: Vitamin B-12: 470 pg/mL (ref 180–914)

## 2019-09-01 LAB — HEMOGLOBIN A1C
Hgb A1c MFr Bld: 12 % — ABNORMAL HIGH (ref 4.8–5.6)
Mean Plasma Glucose: 297.7 mg/dL

## 2019-09-01 LAB — C-REACTIVE PROTEIN: CRP: 5.9 mg/dL — ABNORMAL HIGH (ref <1.0)

## 2019-09-01 LAB — D-DIMER, QUANTITATIVE: D-Dimer, Quant: 5.38 ug/mL-FEU — ABNORMAL HIGH (ref 0.00–0.50)

## 2019-09-01 LAB — TSH: TSH: 3.264 u[IU]/mL (ref 0.350–4.500)

## 2019-09-01 MED ORDER — SODIUM CHLORIDE 0.9% FLUSH: 10.0000 mL | INTRAVENOUS | Status: DC | PRN

## 2019-09-01 MED ORDER — CHLORHEXIDINE GLUCONATE CLOTH 2 % EX PADS
6.0000 | MEDICATED_PAD | Freq: Every day | CUTANEOUS | Status: DC
  Administered 2019-09-01 – 2019-09-04 (×3): 6 via TOPICAL

## 2019-09-01 MED ORDER — SODIUM CHLORIDE 0.45 % IV SOLN: INTRAVENOUS | Status: DC

## 2019-09-01 MED ORDER — INSULIN ASPART 100 UNIT/ML ~~LOC~~ SOLN
0.0000 [IU] | Freq: Three times a day (TID) | SUBCUTANEOUS | Status: DC
  Administered 2019-09-01: 2 [IU] via SUBCUTANEOUS
  Administered 2019-09-02 (×2): 3 [IU] via SUBCUTANEOUS
  Administered 2019-09-02 – 2019-09-03 (×2): 2 [IU] via SUBCUTANEOUS
  Administered 2019-09-04: 5 [IU] via SUBCUTANEOUS
  Administered 2019-09-04: 2 [IU] via SUBCUTANEOUS
  Administered 2019-09-05: 5 [IU] via SUBCUTANEOUS

## 2019-09-01 MED ORDER — INSULIN ASPART 100 UNIT/ML ~~LOC~~ SOLN
3.0000 [IU] | Freq: Three times a day (TID) | SUBCUTANEOUS | Status: DC
  Administered 2019-09-01 – 2019-09-05 (×8): 3 [IU] via SUBCUTANEOUS

## 2019-09-01 MED ORDER — HEPARIN SODIUM (PORCINE) 5000 UNIT/ML IJ SOLN
7500.0000 [IU] | Freq: Three times a day (TID) | INTRAMUSCULAR | Status: DC
  Administered 2019-09-01 – 2019-09-03 (×6): 7500 [IU] via SUBCUTANEOUS
  Filled 2019-09-01 (×7): qty 2

## 2019-09-01 MED ORDER — VITAMIN D (ERGOCALCIFEROL) 1.25 MG (50000 UNIT) PO CAPS
50000.0000 [IU] | ORAL_CAPSULE | ORAL | Status: DC
  Administered 2019-09-01: 50000 [IU] via ORAL
  Filled 2019-09-01: qty 1

## 2019-09-01 MED ORDER — SODIUM CHLORIDE 0.9% FLUSH
10.0000 mL | Freq: Two times a day (BID) | INTRAVENOUS | Status: DC
  Administered 2019-09-01 – 2019-09-03 (×4): 10 mL

## 2019-09-01 MED ORDER — INSULIN GLARGINE 100 UNIT/ML ~~LOC~~ SOLN
15.0000 [IU] | Freq: Two times a day (BID) | SUBCUTANEOUS | Status: DC
  Administered 2019-09-01 – 2019-09-03 (×5): 15 [IU] via SUBCUTANEOUS
  Filled 2019-09-01 (×6): qty 0.15

## 2019-09-01 MED ORDER — TOCILIZUMAB 400 MG/20ML IV SOLN
500.0000 mg | Freq: Once | INTRAVENOUS | Status: AC
  Administered 2019-09-01: 500 mg via INTRAVENOUS
  Filled 2019-09-01: qty 20

## 2019-09-01 MED ORDER — INSULIN ASPART 100 UNIT/ML ~~LOC~~ SOLN
0.0000 [IU] | Freq: Every day | SUBCUTANEOUS | Status: DC
  Administered 2019-09-04: 3 [IU] via SUBCUTANEOUS

## 2019-09-01 NOTE — ED Notes (Signed)
Patient is confused and continuously getting out of bed and pulling all of her monitors off. Patient placed on a bed alarm.

## 2019-09-01 NOTE — Progress Notes (Signed)
PROGRESS NOTE  [name] BZJ:[id number] DOB: February 13, 1935   PCP: Lucianne Lei, MD  Patient is from: Home.  Quite independent at baseline.  Lives alone and performs all ADLs and also drives.  DOA: 08/31/2019 LOS: 1  Brief Narrative / Interim history: 85 year old female with history of DM-2, HTN, CAD/DES to [place] in 05/2018, asthma, lymphoproliferative disease being monitored by Dr. Burr Medico and arthritis presenting with generalized weakness, fatigue, DOE and uncontrolled blood glucose for about a week.  Reportedly noncompliant with her insulin.  She tested positive for COVID-19 on 08/28/2018.  In ED, and febrile.  HDS.  Saturation 91 to 92% on RA but desaturated to 88% when she fell asleep.  Labs consistent with DKA.  pH elevated to 4.28.  CXR without significant finding.  EKG NSR with mild LVH.  Patient admitted for DKA and tested positive for COVID-19 infection.  Started on IV fluid and insulin drip per protocol for DKA.  Started on remdesivir for COVID-19 infection.   Subjective: Patient developed encephalopathy with confusion and agitation overnight requiring mittens, now Posey belt.  Aimlessly moving her arms.  Totally disoriented.  Mumbles but doesn't converse or follow command.  Objective: Vitals:   09/01/19 1011 09/01/19 1100 09/01/19 1132 09/01/19 1150  BP: 129/61 (!) 108/47    Pulse: 84 82 84 85  Resp:      Temp:   98.5 F (36.9 C)   TempSrc:   Axillary   SpO2: 94% 96% 95% 95%  Weight:      Height:        Intake/Output Summary (Last 24 hours) at 09/01/2019 1156 Last data filed at 09/01/2019 1156 Gross per 24 hour  Intake 2649.89 ml  Output -  Net 2649.89 ml   Filed Weights   09/01/19 0344  Weight: 59.8 kg    Examination:  GENERAL: Confused and disoriented. HEENT: MMM.  Vision and hearing grossly intact.  NECK: Supple.  No apparent JVD.  RESP:  No IWOB. Good air movement bilaterally. CVS:  RRR. Heart sounds normal.  ABD/GI/GU: Bowel sounds present. Soft. Non  tender.  Posey belt MSK/EXT:  Moves all extremities. No apparent deformity. No edema.  [place]. SKIN: no apparent skin lesion or wound NEURO: Awake but disoriented and confused.  No apparent focal neuro deficit. PSYCH: Confused and disoriented.  Procedures:  None  Assessment & Plan: DKA in patient with uncontrolled DM-2: initial labs consistent with DKA.  Reportedly noncompliant with her insulin.  DKA resolving but patient with acute metabolic encephalopathy now.  -Continue insulin drip and IV fluids until she can take p.o. safely.  -Gentle IV fluid in the setting of Covid infection -Monitor BMP every 4 hours, and CBG every 1 hours. -Check hemoglobin A1c.  COVID-19 infection: presented with generalized weakness, fatigue and DOE.  CXR without acute finding.  Inflammatory markers elevated.  No documented desaturation below 90%.  Recent Labs    09/01/19 0736  DDIMER 5.38*  FERRITIN 391*  LDH 343*  CRP 5.9*  -Continue remdesivir 1/7>> -Not on steroid out of concern about her DKA.  Will give Actemra.  Discussed risk and benefits and an off label use with patient's granddaughter/HCPOA Rayburn Ma -Supportive care -Monitor inflammatory markers   Acute metabolic encephalopathy: suspect delirium in the setting of the above two.  Reportedly confused for the last 2 days.  No history of dementia.  Independent for ADLs and IADL's at baseline.  -CT head, ammonia, TFT and vitamin B12.  Doubt CO2 retention. -Treat treatable causes -Delirium  and fall precautions.  AKI on CKD-3a: Cr 1.3 (in 03/2019)> 2.45 (admit)>> 1.92 -Hold home Benicar -Continue monitoring -IV fluid as above  Hypernatremia: Na 146 -Change IV fluid to half-normal saline  Anion gap metabolic acidosis: Likely due to DKA.  Resolved.  Essential hypertension: Normotensive -Continue amlodipine -Hold Benicar.  CAD s/p DES to pRCA in 05/2018: EKG without acute ischemic finding.  Borderline BNP -Check high-sensitivity  troponin -Repeat BMP. -Continue home metoprolol, Plavix and aspirin -Needs a statin unless contraindicated  History of asthma -Inhalers  Hypothyroidism -Check TFT. -Continue home Synthroid  Generalized weakness/physical deconditioning: Likely due to acute illness and uncontrolled diabetes. -We will get PT/OT when medically stable.                DVT prophylaxis: Subcu heparin Code Status: Full code-confirmed with granddaughter, Rayburn Ma who claims HCPOA Family Communication: Updated daughter and granddaughter. Disposition Plan: Remains in stepdown while insulin drip Consultants: None   Microbiology summarized: 08/28/2018-COVID-19 positive. MRSA PCR negative.  Sch Meds:  Scheduled Meds: . amLODipine  5 mg Oral Daily  . aspirin EC  81 mg Oral Daily  . Chlorhexidine Gluconate Cloth  6 each Topical Daily  . clopidogrel  75 mg Oral Daily  . cycloSPORINE  1 drop Both Eyes BID  . heparin injection (subcutaneous)  7,500 Units Subcutaneous Q8H  . levothyroxine  50 mcg Oral Q0600  . Lifitegrast  2 drop Both Eyes BID  . metoprolol succinate  25 mg Oral Daily  . rosuvastatin  20 mg Oral Daily  . sodium chloride flush  10-40 mL Intracatheter Q12H  . umeclidinium-vilanterol  1 puff Inhalation Daily  . Vitamin D (Ergocalciferol)  50,000 Units Oral Q7 days   Continuous Infusions: . sodium chloride Stopped (09/01/19 1100)  . dextrose 5 % and 0.45% NaCl 75 mL/hr at 09/01/19 1102  . insulin 4.2 mL/hr at 09/01/19 1156  . remdesivir 100 mg in NS 100 mL Stopped (09/01/19 1135)   PRN Meds:.acetaminophen, dextrose, meclizine, nitroGLYCERIN, ondansetron **OR** ondansetron (ZOFRAN) IV, sodium chloride flush  Antimicrobials: Anti-infectives (From admission, onward)   Start     Dose/Rate Route Frequency Ordered Stop   09/01/19 1000  remdesivir 100 mg in sodium chloride 0.9 % 100 mL IVPB     100 mg 200 mL/hr over 30 Minutes Intravenous Daily 08/31/19 1547 09/05/19 0959    08/31/19 1800  remdesivir 200 mg in sodium chloride 0.9% 250 mL IVPB     200 mg 580 mL/hr over 30 Minutes Intravenous Once 08/31/19 1547 08/31/19 2023       I have personally reviewed the following labs and images: CBC: Recent Labs  Lab 08/31/19 1141 09/01/19 0019  WBC 8.9 11.3*  NEUTROABS 6.0  --   HGB 13.7 12.2  HCT 43.5 37.7  MCV 87.9 87.3  PLT 180 215   BMP &GFR Recent Labs  Lab 08/31/19 1141 08/31/19 1952 09/01/19 0501 09/01/19 1052  NA 137 146* 146* 146*  K 5.7* 4.8 4.0 3.8  CL 102 116* 114* 114*  CO2 17* 21* 19* 22  GLUCOSE 680* 272* 198* 238*  BUN 61* 42* 35* 32*  CREATININE 2.45* 1.61* 1.56* 1.92*  CALCIUM 10.0 8.9 9.3 9.4   Estimated Creatinine Clearance: 18.7 mL/min (A) (by C-G formula based on SCr of 1.92 mg/dL (H)). Liver & Pancreas: Recent Labs  Lab 08/31/19 1141 09/01/19 0736  AST 35 43*  ALT 32 28  ALKPHOS 69 60  BILITOT 0.9 0.8  PROT 8.3* 7.4  ALBUMIN  3.6 3.2*   Recent Labs  Lab 08/31/19 1141  LIPASE 33   No results for input(s): AMMONIA in the last 168 hours. Diabetic: No results for input(s): HGBA1C in the last 72 hours. Recent Labs  Lab 09/01/19 0723 09/01/19 0830 09/01/19 0950 09/01/19 1058 09/01/19 1154  GLUCAP 292* 289* 264* 193* 156*   Cardiac Enzymes: No results for input(s): CKTOTAL, CKMB, CKMBINDEX, TROPONINI in the last 168 hours. No results for input(s): PROBNP in the last 8760 hours. Coagulation Profile: No results for input(s): INR, PROTIME in the last 168 hours. Thyroid Function Tests: No results for input(s): TSH, T4TOTAL, FREET4, T3FREE, THYROIDAB in the last 72 hours. Lipid Profile: No results for input(s): CHOL, HDL, LDLCALC, TRIG, CHOLHDL, LDLDIRECT in the last 72 hours. Anemia Panel: Recent Labs    09/01/19 0736  FERRITIN 391*   Urine analysis:    Component Value Date/Time   COLORURINE STRAW (A) 08/31/2019 1531   APPEARANCEUR CLEAR 08/31/2019 1531   LABSPEC 1.014 08/31/2019 1531   PHURINE 5.0  08/31/2019 1531   GLUCOSEU >=500 (A) 08/31/2019 1531   HGBUR MODERATE (A) 08/31/2019 1531   BILIRUBINUR NEGATIVE 08/31/2019 1531   KETONESUR 5 (A) 08/31/2019 1531   PROTEINUR 30 (A) 08/31/2019 1531   UROBILINOGEN 0.2 01/29/2014 1053   NITRITE NEGATIVE 08/31/2019 1531   LEUKOCYTESUR NEGATIVE 08/31/2019 1531   Sepsis Labs: Invalid input(s): PROCALCITONIN, [place]  Microbiology: Recent Results (from the past 240 hour(s))  Novel Coronavirus, NAA ([facility])     Status: Abnormal   Collection Time: 08/29/19  3:59 PM   Specimen: Nasopharyngeal(NP) swabs in vial transport medium   NASOPHARYNGE  SCREENIN  Result Value Ref Range Status   SARS-CoV-2, NAA Detected (A) Not Detected Final    Comment: This nucleic acid amplification test was developed and its performance characteristics determined by [employer]. Nucleic acid amplification tests include PCR and TMA. This test has not been FDA cleared or approved. This test has been authorized by FDA under an Emergency Use Authorization (EUA). This test is only authorized for the duration of time the declaration that circumstances exist justifying the authorization of the emergency use of in vitro diagnostic tests for detection of SARS-CoV-2 virus and/or diagnosis of COVID-19 infection under section 564(b)(1) of the Act, 21 U.S.C. 712WPY-0(D) (1), unless the authorization is terminated or revoked sooner. When diagnostic testing is negative, the possibility of a false negative result should be considered in the context of a patient's recent exposures and the presence of clinical signs and symptoms consistent with COVID-19. An individual without symptoms of COVID-19 and who is not shedding SARS-CoV-2 virus would  expect to have a negative (not detected) result in this assay.   MRSA PCR Screening     Status: None   Collection Time: 09/01/19  3:46 AM   Specimen: Nasal Mucosa; Nasopharyngeal  Result Value Ref Range Status   MRSA by  PCR NEGATIVE NEGATIVE Final    Comment:        The GeneXpert MRSA Assay (FDA approved for NASAL specimens only), is one component of a comprehensive MRSA colonization surveillance program. It is not intended to diagnose MRSA infection nor to guide or monitor treatment for MRSA infections. Performed at [facility], [place] [address] [place], [place] [address]     Radiology Studies: DG Chest Portable 1 View  Result Date: 08/31/2019 CLINICAL DATA:  COVID positive, hyperglycemia EXAM: PORTABLE CHEST 1 VIEW COMPARISON:  06/14/2018 FINDINGS: The heart size and mediastinal contours are within normal  limits. Both lungs are clear. No pleural effusion. The visualized skeletal structures are unremarkable. IMPRESSION: No acute process in the chest. Electronically Signed   By: Macy Mis M.D.   On: 08/31/2019 12:18    45 minutes with more than 50% spent in reviewing records, counseling patient/family and coordinating care.   Gianlucas Evenson T. Delta  If 7PM-7AM, please contact night-coverage [email or web address] Password [facility] 09/01/2019, 11:56 AM

## 2019-09-01 NOTE — Progress Notes (Signed)
Inpatient Diabetes Program Recommendations  AACE/ADA: New Consensus Statement on Inpatient Glycemic Control (2015)  Target Ranges:  Prepandial:   less than 140 mg/dL      Peak postprandial:   less than 180 mg/dL (1-2 hours)      Critically ill patients:  140 - 180 mg/dL   Lab Results  Component Value Date   GLUCAP 264 (H) 09/01/2019    Review of Glycemic Control  Diabetes history: DM 2 Outpatient Diabetes medications: Lantus 30 qhs, Trulicity 1.61 mg QFriday, Metformin 10000 mg qhs Current orders for Inpatient glycemic control:  IV insulin gtt  Inpatient Diabetes Program Recommendations:    Pt waiting on new IV site for IV insulin. Insulin gtt rate this am running at 11 units/hour. Glucose still in 200's.  Recommend leaving pt on IV insulin for now.  Thanks,  Tama Headings RN, MSN, BC-ADM Inpatient Diabetes Coordinator Team Pager [phone] (8a-5p)

## 2019-09-01 NOTE — Progress Notes (Signed)
Pt arrived to the unit. Pt A & O to self. Pt on 2 liters of O2 nasal cannula. Pt has Insulin gtt and D5 half saline running. Vital signs obtained WNL. Admission assessment complete. CHG bath completed. Telemetry monitoring started and verified. Pt is pulling at tubes and wires. Bilateral hand mitten in place. Will continue to monitor.

## 2019-09-01 NOTE — Evaluation (Signed)
Clinical/Bedside Swallow Evaluation Patient Details  Name: [name] MRN: [id number] Date of Birth: [birth year]  Today's Date: 09/01/2019 Time: SLP Start Time (ACUTE ONLY): 1062 SLP Stop Time (ACUTE ONLY): 1532 SLP Time Calculation (min) (ACUTE ONLY): 21 min  Past Medical History:  Past Medical History:  Diagnosis Date  . Arthritis   . Asthma   . CAD (coronary artery disease)    s/p NSTEMI 10/19 - LHC:  pRCA 99, oLCx 30 >> DES to RCA; Echo 10/19: Mild focal basal septal hypertrophy, EF 60-65, inf-lat, inf and inf-sept HK, trivial MR, trivial TR  . Diabetes mellitus without complication ([place])   . Hypertension    Past Surgical History:  Past Surgical History:  Procedure Laterality Date  . ABDOMINAL HYSTERECTOMY  "many yrs ago"  . AXILLARY LYMPH NODE BIOPSY Right 02/26/2015   Procedure: RIGHT AXILLARY LYMPH NODE BIOPSY;  Surgeon: Jackolyn Confer, MD;  Location: [facility];  Service: General;  Laterality: Right;  . CHOLECYSTECTOMY    . CORONARY STENT INTERVENTION N/A 06/15/2018   Procedure: CORONARY STENT INTERVENTION;  Surgeon: Wellington Hampshire, MD;  Location: [facility];  Service: Cardiovascular;  Laterality: N/A;  . LEFT HEART CATH AND CORONARY ANGIOGRAPHY N/A 06/15/2018   Procedure: LEFT HEART CATH AND CORONARY ANGIOGRAPHY;  Surgeon: Wellington Hampshire, MD;  Location: [facility];  Service: Cardiovascular;  Laterality: N/A;   HPI:  [name] is a 85 y.o. female with history h/o diabetes mellitus, hypertension, CAD, asthma who lives with her sister and recently tested positive for Covid (on 1/5) presents to the ED with complaints of generalized weakness, fatigue, uncontrolled blood glucose levels and dyspnea on exertion. Found to be in DKA however per chart it is resolving but patient with acute metabolic encephalopathy and agitation now. Per chart pt independent at baseline, lives alone. Chest x-ray without any infiltrates.     Assessment / Plan /  Recommendation Clinical Impression  Pt mildly confused however appears improved from earlier documentation. She followed commands with oral motor examination revealing lingual candidias. Pt is edentuouls without dentures present and pt reports she eats with dentures donned. Cognitive deficits likely etiology of oral holding and delayed transit with most textures with liquid wash removing mild lingual residue. No s/s aspiration and respiratory and swallow reciprocity adequate. Her O2 sats remained 96%or above. Recommend Dys 1 (puree) texture, thin liquids, crush pills and full supervision. ST will upgrade when safe.  SLP Visit Diagnosis: Dysphagia, unspecified (R13.10)    Aspiration Risk  Mild aspiration risk    Diet Recommendation Dysphagia 1 (Puree);Thin liquid   Medication Administration: Whole meds with puree Supervision: Patient able to self feed;Full supervision/cueing for compensatory strategies Compensations: Slow rate;Small sips/bites    Other  Recommendations Oral Care Recommendations: Oral care BID   Follow up Recommendations 24 hour supervision/assistance      Frequency and Duration min 2x/week  2 weeks       Prognosis Prognosis for Safe Diet Advancement: Good Barriers to Reach Goals: Cognitive deficits      Swallow Study   General HPI: [name] is a 85 y.o. female with history h/o diabetes mellitus, hypertension, CAD, asthma who lives with her sister and recently tested positive for Covid (on 1/5) presents to the ED with complaints of generalized weakness, fatigue, uncontrolled blood glucose levels and dyspnea on exertion. Found to be in DKA however per chart it is resolving but patient with acute metabolic encephalopathy and agitation now. Per chart pt  independent at baseline, lives alone. Chest x-ray without any infiltrates.   Type of Study: Bedside Swallow Evaluation Previous Swallow Assessment: none Diet Prior to this Study: Regular;Thin  liquids Temperature Spikes Noted: No Respiratory Status: Nasal cannula History of Recent Intubation: No Behavior/Cognition: Alert;Cooperative;Requires cueing Oral Cavity Assessment: (lingual candidia) Oral Care Completed by SLP: Recent completion by staff Oral Cavity - Dentition: Edentulous Vision: Functional for self-feeding Self-Feeding Abilities: Able to feed self Patient Positioning: Upright in bed Baseline Vocal Quality: Normal Volitional Cough: Weak Volitional Swallow: Able to elicit    Oral/Motor/Sensory Function Overall Oral Motor/Sensory Function: Within functional limits   Ice Chips Ice chips: Not tested   Thin Liquid Thin Liquid: Impaired Presentation: Cup;Straw Oral Phase Impairments: Reduced lingual movement/coordination Oral Phase Functional Implications: Oral holding    Nectar Thick Nectar Thick Liquid: Not tested   Honey Thick Honey Thick Liquid: Not tested   Puree Puree: Impaired Oral Phase Impairments: Reduced lingual movement/coordination Oral Phase Functional Implications: Oral holding   Solid     Solid: Impaired Oral Phase Impairments: Reduced lingual movement/coordination Oral Phase Functional Implications: Impaired mastication      [name] 09/01/2019,5:23 PM  [name].Ed Risk analyst [phone] Office [phone]

## 2019-09-01 NOTE — Progress Notes (Signed)
Upon assessment, patient IV in left forearm red, edematous. IV fluids and IV insulin stopped until IV access can be obtained. IV team consult put in STAT. RN attempted IV x1 with no success.

## 2019-09-02 LAB — COMPREHENSIVE METABOLIC PANEL
ALT: 40 U/L (ref 0–44)
AST: 77 U/L — ABNORMAL HIGH (ref 15–41)
Albumin: 3 g/dL — ABNORMAL LOW (ref 3.5–5.0)
Alkaline Phosphatase: 52 U/L (ref 38–126)
Anion gap: 7 (ref 5–15)
BUN: 38 mg/dL — ABNORMAL HIGH (ref 8–23)
CO2: 24 mmol/L (ref 22–32)
Calcium: 9.5 mg/dL (ref 8.9–10.3)
Chloride: 118 mmol/L — ABNORMAL HIGH (ref 98–111)
Creatinine, Ser: 2 mg/dL — ABNORMAL HIGH (ref 0.44–1.00)
GFR calc Af Amer: 26 mL/min — ABNORMAL LOW (ref >60)
GFR calc non Af Amer: 23 mL/min — ABNORMAL LOW (ref >60)
Glucose, Bld: 144 mg/dL — ABNORMAL HIGH (ref 70–99)
Potassium: 4.4 mmol/L (ref 3.5–5.1)
Sodium: 149 mmol/L — ABNORMAL HIGH (ref 135–145)
Total Bilirubin: 0.9 mg/dL (ref 0.3–1.2)
Total Protein: 7.5 g/dL (ref 6.5–8.1)

## 2019-09-02 LAB — FERRITIN: Ferritin: 462 ng/mL — ABNORMAL HIGH (ref 11–307)

## 2019-09-02 LAB — CBC WITH DIFFERENTIAL/PLATELET
Abs Immature Granulocytes: 0.03 10*3/uL (ref 0.00–0.07)
Basophils Absolute: 0 10*3/uL (ref 0.0–0.1)
Basophils Relative: 1 %
Eosinophils Absolute: 0 10*3/uL (ref 0.0–0.5)
Eosinophils Relative: 0 %
HCT: 41.9 % (ref 36.0–46.0)
Hemoglobin: 13.1 g/dL (ref 12.0–15.0)
Immature Granulocytes: 1 %
Lymphocytes Relative: 39 %
Lymphs Abs: 2.5 10*3/uL (ref 0.7–4.0)
MCH: 27.4 pg (ref 26.0–34.0)
MCHC: 31.3 g/dL (ref 30.0–36.0)
MCV: 87.7 fL (ref 80.0–100.0)
Monocytes Absolute: 0.4 10*3/uL (ref 0.1–1.0)
Monocytes Relative: 6 %
Neutro Abs: 3.5 10*3/uL (ref 1.7–7.7)
Neutrophils Relative %: 53 %
Platelets: 174 10*3/uL (ref 150–400)
RBC: 4.78 MIL/uL (ref 3.87–5.11)
RDW: 14.8 % (ref 11.5–15.5)
WBC: 6.5 10*3/uL (ref 4.0–10.5)
nRBC: 0 % (ref 0.0–0.2)

## 2019-09-02 LAB — BASIC METABOLIC PANEL
Anion gap: 9 (ref 5–15)
BUN: 44 mg/dL — ABNORMAL HIGH (ref 8–23)
CO2: 22 mmol/L (ref 22–32)
Calcium: 9.3 mg/dL (ref 8.9–10.3)
Chloride: 118 mmol/L — ABNORMAL HIGH (ref 98–111)
Creatinine, Ser: 2.04 mg/dL — ABNORMAL HIGH (ref 0.44–1.00)
GFR calc Af Amer: 26 mL/min — ABNORMAL LOW (ref >60)
GFR calc non Af Amer: 22 mL/min — ABNORMAL LOW (ref >60)
Glucose, Bld: 181 mg/dL — ABNORMAL HIGH (ref 70–99)
Potassium: 3.9 mmol/L (ref 3.5–5.1)
Sodium: 149 mmol/L — ABNORMAL HIGH (ref 135–145)

## 2019-09-02 LAB — C-REACTIVE PROTEIN: CRP: 7.1 mg/dL — ABNORMAL HIGH (ref <1.0)

## 2019-09-02 LAB — LACTATE DEHYDROGENASE: LDH: 450 U/L — ABNORMAL HIGH (ref 98–192)

## 2019-09-02 LAB — GLUCOSE, CAPILLARY
Glucose-Capillary: 128 mg/dL — ABNORMAL HIGH (ref 70–99)
Glucose-Capillary: 180 mg/dL — ABNORMAL HIGH (ref 70–99)
Glucose-Capillary: 180 mg/dL — ABNORMAL HIGH (ref 70–99)
Glucose-Capillary: 84 mg/dL (ref 70–99)

## 2019-09-02 LAB — T4, FREE: Free T4: 1.03 ng/dL (ref 0.61–1.12)

## 2019-09-02 LAB — D-DIMER, QUANTITATIVE: D-Dimer, Quant: 2.41 ug/mL-FEU — ABNORMAL HIGH (ref 0.00–0.50)

## 2019-09-02 MED ORDER — SODIUM CHLORIDE 0.45 % IV SOLN: INTRAVENOUS | Status: DC

## 2019-09-02 MED ORDER — LINAGLIPTIN 5 MG PO TABS
5.0000 mg | ORAL_TABLET | Freq: Every day | ORAL | Status: DC
  Administered 2019-09-02 – 2019-09-05 (×4): 5 mg via ORAL
  Filled 2019-09-02 (×4): qty 1

## 2019-09-02 NOTE — Progress Notes (Signed)
PROGRESS NOTE  [name] ZDG:[id number] DOB: 05/04/1935   PCP: Lucianne Lei, MD  Patient is from: Home.  Quite independent at baseline.  Lives alone and performs all ADLs and also drives.  DOA: 08/31/2019 LOS: 2  Brief Narrative / Interim history: 85 year old female with history of DM-2, HTN, CAD/DES to [facility] in 05/2018, asthma, lymphoproliferative disease being monitored by Dr. Burr Medico and arthritis presenting with generalized weakness, fatigue, DOE and uncontrolled blood glucose for about a week.  Reportedly noncompliant with her insulin.  She tested positive for COVID-19 on 08/28/2018.  In ED, and febrile.  HDS.  Saturation 91 to 92% on RA but desaturated to 88% when she fell asleep.  Labs consistent with DKA.  pH elevated to 4.28.  CXR without significant finding.  EKG NSR with mild LVH.  Patient admitted for DKA and tested positive for COVID-19 infection.  Started on IV fluid and insulin drip per protocol for DKA.  Started on remdesivir for COVID-19 infection.    DKA resolved.  Patient was transitioned to subcu insulin and Tradjenta. Received Actemra the next day for COVID-19 infection.  Encephalopathy work-up including CT head, ammonia, vitamin B12 and thyroid panel within normal range.  Subjective: No major events overnight of this morning.  No complaint this morning.  Encephalopathy resolved.  She is oriented x4 except the year.  Denies chest pain, shortness of breath, cough, GI or UTI symptoms.  Denies headache, focal weakness, numbness or tingling.  Objective: Vitals:   09/02/19 0700 09/02/19 0800 09/02/19 0809 09/02/19 0939  BP: 103/62 (!) 87/57 (!) 108/59 119/70  Pulse: 70 74 71 87  Resp: (!) 23 (!) 22 (!) 26   Temp:  97.8 F (36.6 C)    TempSrc:  Oral    SpO2: 96% 95% 92%   Weight:      Height:        Intake/Output Summary (Last 24 hours) at 09/02/2019 1101 Last data filed at 09/01/2019 1747 Gross per 24 hour  Intake 533.76 ml  Output --  Net 533.76 ml   Filed  Weights   09/01/19 0344  Weight: 59.8 kg    Examination:  GENERAL: No acute distress.  Appears well.  HEENT: MMM.  Vision and hearing grossly intact.  NECK: Supple.  No apparent JVD.  RESP:  No IWOB. Good air movement bilaterally. CVS:  RRR. Heart sounds normal.  ABD/GI/GU: Bowel sounds present. Soft. Non tender.  MSK/EXT:  Moves extremities. No apparent deformity. No edema.  SKIN: no apparent skin lesion or wound NEURO: Awake, alert and oriented x4 except year.  She states 2020.  No apparent focal neuro deficit. PSYCH: Calm. Normal affect.  Procedures:  None  Assessment & Plan: DKA in patient with uncontrolled DM-2: Likely due to noncompliance and acute illness.  DKA resolved. Recent Labs    09/01/19 1721 09/01/19 2246 09/02/19 0753  GLUCAP 132* 130* 128*  -Continue Lantus 50 units twice daily, NovoLog 3 units AC and SSI-moderate -Added Tradjenta. -Check hemoglobin A1c. -Will start a statin prior to discharge unless contraindication.  COVID-19 infection: presented with generalized weakness, fatigue and DOE.  CXR without acute finding.  Inflammatory markers elevated.  Saturating in mid 90s on 3 L. Recent Labs    09/01/19 0736 09/02/19 0150  DDIMER 5.38* 2.41*  FERRITIN 391* 462*  LDH 343* 450*  CRP 5.9* 7.1*  -Received Actemra 1/8. -Continue remdesivir 1/7>> -Avoiding the steroid due to DKA and diabetes. -Supportive care-incentive spirometry, OOB/PT/OT -Monitor inflammatory markers   Acute  metabolic encephalopathy: Likely due to the above 2.  Work-up including CT head, ammonia, TFTs and vitamin B12 not revealing. Encephalopathy resolved.  Oriented x4 except the year. Independent for ADLs and IADL's at baseline. -Treat treatable causes -Delirium and fall precautions.  AKI on CKD-3a: Cr 1.3 (in 03/2019)> 2.45 (admit)>> 1.92> 2.0 -Continue holding Benicar -Continue monitoring -IV fluid as above  Hypernatremia: Na 146> 149 -Resume half-normal saline.  Anion  gap metabolic acidosis: Likely due to DKA.  Resolved.  Essential hypertension: Soft blood pressures overnight but asymptomatic. -Hold amlodipine and Benicar -Continue metoprolol -IV fluid  CAD s/p DES to pRCA in 05/2018: Patient without chest pain.  EKG and troponin negative.  Borderline BNP -Continue home metoprolol, Plavix and aspirin -Needs a statin unless contraindicated  History of asthma -Inhalers  Hypothyroidism: TSH was normal. -Continue home Synthroid  Generalized weakness/physical deconditioning: Likely due to acute illness and uncontrolled diabetes. -PT/OT when medically stable.                DVT prophylaxis: Subcu heparin Code Status: Full code  Family Communication: Updated granddaughter, Rayburn Ma [facility]) Disposition Plan: Remains inpatient due to hypernatremia.  Final disposition after PT/OT. Consultants: None   Microbiology summarized: 08/28/2018-COVID-19 positive. MRSA PCR negative.  Sch Meds:  Scheduled Meds: . amLODipine  5 mg Oral Daily  . aspirin EC  81 mg Oral Daily  . Chlorhexidine Gluconate Cloth  6 each Topical Daily  . clopidogrel  75 mg Oral Daily  . cycloSPORINE  1 drop Both Eyes BID  . heparin injection (subcutaneous)  7,500 Units Subcutaneous Q8H  . insulin aspart  0-15 Units Subcutaneous TID WC  . insulin aspart  0-5 Units Subcutaneous QHS  . insulin aspart  3 Units Subcutaneous TID WC  . insulin glargine  15 Units Subcutaneous BID  . levothyroxine  50 mcg Oral Q0600  . Lifitegrast  2 drop Both Eyes BID  . metoprolol succinate  25 mg Oral Daily  . rosuvastatin  20 mg Oral Daily  . sodium chloride flush  10-40 mL Intracatheter Q12H  . umeclidinium-vilanterol  1 puff Inhalation Daily  . Vitamin D (Ergocalciferol)  50,000 Units Oral Q7 days   Continuous Infusions: . sodium chloride 75 mL/hr at 09/02/19 1045  . remdesivir 100 mg in NS 100 mL Stopped (09/02/19 1045)   PRN Meds:.acetaminophen, dextrose, meclizine,  nitroGLYCERIN, ondansetron **OR** ondansetron (ZOFRAN) IV, sodium chloride flush  Antimicrobials: Anti-infectives (From admission, onward)   Start     Dose/Rate Route Frequency Ordered Stop   09/01/19 1000  remdesivir 100 mg in sodium chloride 0.9 % 100 mL IVPB     100 mg 200 mL/hr over 30 Minutes Intravenous Daily 08/31/19 1547 09/05/19 0959   08/31/19 1800  remdesivir 200 mg in sodium chloride 0.9% 250 mL IVPB     200 mg 580 mL/hr over 30 Minutes Intravenous Once 08/31/19 1547 08/31/19 2023       I have personally reviewed the following labs and images: CBC: Recent Labs  Lab 08/31/19 1141 09/01/19 0019 09/02/19 0150  WBC 8.9 11.3* 6.5  NEUTROABS 6.0  --  3.5  HGB 13.7 12.2 13.1  HCT 43.5 37.7 41.9  MCV 87.9 87.3 87.7  PLT 180 215 174   BMP &GFR Recent Labs  Lab 09/01/19 0501 09/01/19 1052 09/01/19 1330 09/01/19 1839 09/02/19 0150  NA 146* 146* 149* 148* 149*  K 4.0 3.8 3.7 3.9 4.4  CL 114* 114* 118* 117* 118*  CO2 19* 22 21* 21*  24  GLUCOSE 198* 238* 136* 142* 144*  BUN 35* 32* 33* 34* 38*  CREATININE 1.56* 1.92* 1.77* 1.71* 2.00*  CALCIUM 9.3 9.4 9.7 9.4 9.5   Estimated Creatinine Clearance: 17.9 mL/min (A) (by C-G formula based on SCr of 2 mg/dL (H)). Liver & Pancreas: Recent Labs  Lab 08/31/19 1141 09/01/19 0736 09/02/19 0150  AST 35 43* 77*  ALT 32 28 40  ALKPHOS 69 60 52  BILITOT 0.9 0.8 0.9  PROT 8.3* 7.4 7.5  ALBUMIN 3.6 3.2* 3.0*   Recent Labs  Lab 08/31/19 1141  LIPASE 33   Recent Labs  Lab 09/01/19 1330  AMMONIA 32   Diabetic: Recent Labs    09/01/19 1300  HGBA1C 12.0*   Recent Labs  Lab 09/01/19 1509 09/01/19 1620 09/01/19 1721 09/01/19 2246 09/02/19 0753  GLUCAP 98 137* 132* 130* 128*   Cardiac Enzymes: No results for input(s): CKTOTAL, CKMB, CKMBINDEX, TROPONINI in the last 168 hours. No results for input(s): PROBNP in the last 8760 hours. Coagulation Profile: No results for input(s): INR, PROTIME in the last 168  hours. Thyroid Function Tests: Recent Labs    09/01/19 1228  TSH 3.264   Lipid Profile: No results for input(s): CHOL, HDL, LDLCALC, TRIG, CHOLHDL, LDLDIRECT in the last 72 hours. Anemia Panel: Recent Labs    09/01/19 0736 09/01/19 1227 09/02/19 0150  VITAMINB12  --  470  --   FERRITIN 391*  --  462*   Urine analysis:    Component Value Date/Time   COLORURINE STRAW (A) 08/31/2019 1531   APPEARANCEUR CLEAR 08/31/2019 1531   LABSPEC 1.014 08/31/2019 1531   PHURINE 5.0 08/31/2019 1531   GLUCOSEU >=500 (A) 08/31/2019 1531   HGBUR MODERATE (A) 08/31/2019 1531   BILIRUBINUR NEGATIVE 08/31/2019 1531   KETONESUR 5 (A) 08/31/2019 1531   PROTEINUR 30 (A) 08/31/2019 1531   UROBILINOGEN 0.2 01/29/2014 1053   NITRITE NEGATIVE 08/31/2019 1531   LEUKOCYTESUR NEGATIVE 08/31/2019 1531   Sepsis Labs: Invalid input(s): PROCALCITONIN, [place]  Microbiology: Recent Results (from the past 240 hour(s))  Novel Coronavirus, NAA ([facility])     Status: Abnormal   Collection Time: 08/29/19  3:59 PM   Specimen: Nasopharyngeal(NP) swabs in vial transport medium   NASOPHARYNGE  SCREENIN  Result Value Ref Range Status   SARS-CoV-2, NAA Detected (A) Not Detected Final    Comment: This nucleic acid amplification test was developed and its performance characteristics determined by [employer]. Nucleic acid amplification tests include PCR and TMA. This test has not been FDA cleared or approved. This test has been authorized by FDA under an Emergency Use Authorization (EUA). This test is only authorized for the duration of time the declaration that circumstances exist justifying the authorization of the emergency use of in vitro diagnostic tests for detection of SARS-CoV-2 virus and/or diagnosis of COVID-19 infection under section 564(b)(1) of the Act, 21 U.S.C. 062BJS-2(G) (1), unless the authorization is terminated or revoked sooner. When diagnostic testing is negative, the  possibility of a false negative result should be considered in the context of a patient's recent exposures and the presence of clinical signs and symptoms consistent with COVID-19. An individual without symptoms of COVID-19 and who is not shedding SARS-CoV-2 virus would  expect to have a negative (not detected) result in this assay.   MRSA PCR Screening     Status: None   Collection Time: 09/01/19  3:46 AM   Specimen: Nasal Mucosa; Nasopharyngeal  Result Value Ref Range Status  MRSA by PCR NEGATIVE NEGATIVE Final    Comment:        The GeneXpert MRSA Assay (FDA approved for NASAL specimens only), is one component of a comprehensive MRSA colonization surveillance program. It is not intended to diagnose MRSA infection nor to guide or monitor treatment for MRSA infections. Performed at [facility], [place] [address] [place], [place] [address]     Radiology Studies: CT HEAD WO CONTRAST  Result Date: 09/01/2019 CLINICAL DATA:  Altered mental status EXAM: CT HEAD WITHOUT CONTRAST TECHNIQUE: Contiguous axial images were obtained from the base of the skull through the vertex without intravenous contrast. COMPARISON:  None. FINDINGS: Brain: There is no acute intracranial hemorrhage, mass-effect, or edema. Allende-white differentiation is preserved. There is no extra-axial fluid collection. Patchy hypoattenuation in the supratentorial white matter is nonspecific but may reflect mild chronic microvascular ischemic changes. Ventricles and sulci are within normal limits in size and configuration. Vascular: There is atherosclerotic calcification at the skull base. Skull: Calvarium is unremarkable. Sinuses/Orbits: Minor mucosal thickening.  Left lens replacement. Other: Mastoid air cells are clear. IMPRESSION: No acute intracranial abnormality. Mild chronic microvascular ischemic changes. Electronically Signed   By: Macy Mis M.D.   On: 09/01/2019 17:58     Delos Klich T. Cromwell  If 7PM-7AM, please contact night-coverage [email or web address] Password [email or web address] 09/02/2019, 11:01 AM

## 2019-09-02 NOTE — Progress Notes (Signed)
Patient has remained free from falls and injuries; skin precautions maintained; VSS overnight and afebrile. Patient up to [facility] twice overnight with minimal assistance. Lap belt removed around 0000. Patient is safe with bed locked and in low position. Bed alarm activated to ensure patient safety. O2 sats running between 93-96 percent on 4LNC.

## 2019-09-03 DIAGNOSIS — E872 Acidosis: Secondary | ICD-10-CM

## 2019-09-03 LAB — COMPREHENSIVE METABOLIC PANEL
ALT: 35 U/L (ref 0–44)
AST: 48 U/L — ABNORMAL HIGH (ref 15–41)
Albumin: 2.7 g/dL — ABNORMAL LOW (ref 3.5–5.0)
Alkaline Phosphatase: 45 U/L (ref 38–126)
Anion gap: 11 (ref 5–15)
BUN: 38 mg/dL — ABNORMAL HIGH (ref 8–23)
CO2: 17 mmol/L — ABNORMAL LOW (ref 22–32)
Calcium: 9.1 mg/dL (ref 8.9–10.3)
Chloride: 119 mmol/L — ABNORMAL HIGH (ref 98–111)
Creatinine, Ser: 1.99 mg/dL — ABNORMAL HIGH (ref 0.44–1.00)
GFR calc Af Amer: 26 mL/min — ABNORMAL LOW (ref >60)
GFR calc non Af Amer: 23 mL/min — ABNORMAL LOW (ref >60)
Glucose, Bld: 94 mg/dL (ref 70–99)
Potassium: 3.7 mmol/L (ref 3.5–5.1)
Sodium: 147 mmol/L — ABNORMAL HIGH (ref 135–145)
Total Bilirubin: 0.6 mg/dL (ref 0.3–1.2)
Total Protein: 6.5 g/dL (ref 6.5–8.1)

## 2019-09-03 LAB — CBC WITH DIFFERENTIAL/PLATELET
Abs Immature Granulocytes: 0.02 10*3/uL (ref 0.00–0.07)
Basophils Absolute: 0 10*3/uL (ref 0.0–0.1)
Basophils Relative: 1 %
Eosinophils Absolute: 0 10*3/uL (ref 0.0–0.5)
Eosinophils Relative: 0 %
HCT: 43.1 % (ref 36.0–46.0)
Hemoglobin: 13.2 g/dL (ref 12.0–15.0)
Immature Granulocytes: 0 %
Lymphocytes Relative: 55 %
Lymphs Abs: 2.6 10*3/uL (ref 0.7–4.0)
MCH: 27.2 pg (ref 26.0–34.0)
MCHC: 30.6 g/dL (ref 30.0–36.0)
MCV: 88.7 fL (ref 80.0–100.0)
Monocytes Absolute: 0.4 10*3/uL (ref 0.1–1.0)
Monocytes Relative: 8 %
Neutro Abs: 1.7 10*3/uL (ref 1.7–7.7)
Neutrophils Relative %: 36 %
Platelets: 166 10*3/uL (ref 150–400)
RBC: 4.86 MIL/uL (ref 3.87–5.11)
RDW: 14.9 % (ref 11.5–15.5)
WBC: 4.7 10*3/uL (ref 4.0–10.5)
nRBC: 0 % (ref 0.0–0.2)

## 2019-09-03 LAB — BASIC METABOLIC PANEL
Anion gap: 8 (ref 5–15)
BUN: 39 mg/dL — ABNORMAL HIGH (ref 8–23)
CO2: 22 mmol/L (ref 22–32)
Calcium: 9.1 mg/dL (ref 8.9–10.3)
Chloride: 116 mmol/L — ABNORMAL HIGH (ref 98–111)
Creatinine, Ser: 2.12 mg/dL — ABNORMAL HIGH (ref 0.44–1.00)
GFR calc Af Amer: 24 mL/min — ABNORMAL LOW (ref >60)
GFR calc non Af Amer: 21 mL/min — ABNORMAL LOW (ref >60)
Glucose, Bld: 132 mg/dL — ABNORMAL HIGH (ref 70–99)
Potassium: 3.4 mmol/L — ABNORMAL LOW (ref 3.5–5.1)
Sodium: 146 mmol/L — ABNORMAL HIGH (ref 135–145)

## 2019-09-03 LAB — GLUCOSE, CAPILLARY
Glucose-Capillary: 93 mg/dL (ref 70–99)
Glucose-Capillary: 135 mg/dL — ABNORMAL HIGH (ref 70–99)
Glucose-Capillary: 114 mg/dL — ABNORMAL HIGH (ref 70–99)
Glucose-Capillary: 98 mg/dL (ref 70–99)

## 2019-09-03 LAB — D-DIMER, QUANTITATIVE: D-Dimer, Quant: 1.92 ug/mL-FEU — ABNORMAL HIGH (ref 0.00–0.50)

## 2019-09-03 LAB — C-REACTIVE PROTEIN: CRP: 3.6 mg/dL — ABNORMAL HIGH (ref <1.0)

## 2019-09-03 LAB — LACTATE DEHYDROGENASE: LDH: 416 U/L — ABNORMAL HIGH (ref 98–192)

## 2019-09-03 LAB — FERRITIN: Ferritin: 356 ng/mL — ABNORMAL HIGH (ref 11–307)

## 2019-09-03 MED ORDER — INSULIN GLARGINE 100 UNIT/ML ~~LOC~~ SOLN
10.0000 [IU] | Freq: Two times a day (BID) | SUBCUTANEOUS | Status: DC
  Administered 2019-09-03 – 2019-09-04 (×2): 10 [IU] via SUBCUTANEOUS
  Filled 2019-09-03 (×3): qty 0.1

## 2019-09-03 MED ORDER — HEPARIN SODIUM (PORCINE) 5000 UNIT/ML IJ SOLN
5000.0000 [IU] | Freq: Three times a day (TID) | INTRAMUSCULAR | Status: DC
  Administered 2019-09-03 – 2019-09-05 (×6): 5000 [IU] via SUBCUTANEOUS
  Filled 2019-09-03 (×5): qty 1

## 2019-09-03 MED ORDER — STERILE WATER FOR INJECTION IV SOLN
INTRAVENOUS | Status: DC
  Filled 2019-09-03: qty 4.81

## 2019-09-03 MED ORDER — POTASSIUM CHLORIDE 20 MEQ PO PACK
40.0000 meq | PACK | ORAL | Status: AC
  Administered 2019-09-03 (×2): 40 meq via ORAL
  Filled 2019-09-03 (×2): qty 2

## 2019-09-03 MED ORDER — STERILE WATER FOR INJECTION IV SOLN
INTRAVENOUS | Status: DC
  Filled 2019-09-03 (×3): qty 990.38

## 2019-09-03 NOTE — Progress Notes (Signed)
PROGRESS NOTE  [name] PJK:[id number] DOB: 02/01/1935   PCP: Lucianne Lei, MD  Patient is from: Home.  Quite independent at baseline.  Lives alone and performs all ADLs and also drives.  DOA: 08/31/2019 LOS: 3  Brief Narrative / Interim history: 85 year old female with history of DM-2, HTN, CAD/DES to [place] in 05/2018, asthma, lymphoproliferative disease being monitored by Dr. Burr Medico and arthritis presenting with generalized weakness, fatigue, DOE and uncontrolled blood glucose for about a week.  Reportedly noncompliant with her insulin.  She tested positive for COVID-19 on 08/28/2018.  In ED, and febrile.  HDS.  Saturation 91 to 92% on RA but desaturated to 88% when she fell asleep.  Labs consistent with DKA.  pH elevated to 4.28.  CXR without significant finding.  EKG NSR with mild LVH.  Patient admitted for DKA and tested positive for COVID-19 infection.  Started on IV fluid and insulin drip per protocol for DKA.  Started on remdesivir for COVID-19 infection.    DKA resolved.  Patient was transitioned to subcu insulin and Tradjenta. Received Actemra the next day for COVID-19 infection.  Encephalopathy work-up including CT head, ammonia, vitamin B12 and thyroid panel within normal range.  Encephalopathy resolved.  Now with mild hypernatremia   Subjective: No major events overnight of this morning.  No complaint this morning.  Denies chest pain, dyspnea, GI or UTI symptoms.  Denies headache or focal neuro symptoms.  Objective: Vitals:   09/02/19 2000 09/02/19 2100 09/02/19 2327 09/03/19 0515  BP: (!) 102/54 122/63 104/65 124/67  Pulse: 74 76 71 71  Resp: (!) 25 20 18 18   Temp:  97.8 F (36.6 C) 98.7 F (37.1 C) 97.9 F (36.6 C)  TempSrc:  Oral Oral Oral  SpO2: 95% 94% 99% 92%  Weight:      Height:        Intake/Output Summary (Last 24 hours) at 09/03/2019 1115 Last data filed at 09/03/2019 0200 Gross per 24 hour  Intake 1403.91 ml  Output 800 ml  Net 603.91 ml   Filed  Weights   09/01/19 0344  Weight: 59.8 kg    Examination:  GENERAL: No acute distress.  Appears well.  HEENT: MMM.  Vision and hearing grossly intact.  NECK: Supple.  No apparent JVD.  RESP:  No IWOB. Good air movement bilaterally. CVS:  RRR. Heart sounds normal.  ABD/GI/GU: Bowel sounds present. Soft. Non tender.  MSK/EXT:  Moves extremities. No apparent deformity. No edema.  SKIN: no apparent skin lesion or wound NEURO: Awake, alert and oriented appropriately.  No apparent focal neuro deficit. PSYCH: Calm. Normal affect.  Procedures:  None  Assessment & Plan: DKA in patient with uncontrolled DM-2: Likely due to noncompliance and acute illness.  DKA resolved. Recent Labs    09/02/19 1711 09/02/19 2128 09/03/19 0752  GLUCAP 180* 84 93  -Continue Tradjenta, NovoLog 3 units AC and SSI-moderate -Decrease Lantus to 10 units twice daily. -Check hemoglobin A1c. -Continue Crestor.  COVID-19 infection: presented with generalized weakness, fatigue and DOE.  CXR without acute finding.  Inflammatory markers elevated but improving.  92% on room air. Recent Labs    09/01/19 0736 09/02/19 0150 09/03/19 0256  DDIMER 5.38* 2.41* 1.92*  FERRITIN 391* 462* 356*  LDH 343* 450* 416*  CRP 5.9* 7.1* 3.6*  -Received Actemra 1/8. -Continue remdesivir 1/7>> -Avoiding the steroid due to DKA and diabetes. -Supportive care-incentive spirometry, OOB/PT/OT -Monitor inflammatory markers  Acute metabolic encephalopathy: Likely due to the above 2.  Work-up  including CT head, ammonia, TFTs and vitamin B12 not revealing. Encephalopathy resolved.  Oriented x4 except the year. Independent for ADLs and IADL's at baseline. -Treat treatable causes -Delirium and fall precautions.  Hypernatremia: Na 146> 149>147 -1/4NS at 75 cc an hour -Recheck BMP in the afternoon.  AKI on CKD-3a: Cr 1.3 (in 03/2019)> 2.45 (admit)>> 1.92> 2.0 -Continue holding Benicar -Continue monitoring -1/4NS at 75 cc an  hour  Non-anion gap metabolic acidosis: Likely due to renal failure and IV fluid. -Continue monitoring  Essential hypertension: Normotensive. -Hold amlodipine and Benicar -Continue metoprolol -IV fluid as above  CAD s/p DES to pRCA in 05/2018: Patient without chest pain.  EKG and troponin negative.  Borderline BNP -Continue home metoprolol, Plavix and aspirin -Needs a statin unless contraindicated  History of asthma -Inhalers  Hypothyroidism: TSH was normal. -Continue home Synthroid  Generalized weakness/physical deconditioning: Likely due to acute illness and uncontrolled diabetes. -PT/OT                DVT prophylaxis: Subcu heparin Code Status: Full code  Family Communication: Updated granddaughter, Rayburn Ma [facility]) Disposition Plan: Remains inpatient due to hypernatremia.  Final disposition after PT/OT. Consultants: None   Microbiology summarized: 08/28/2018-COVID-19 positive. MRSA PCR negative.  Sch Meds:  Scheduled Meds: . aspirin EC  81 mg Oral Daily  . Chlorhexidine Gluconate Cloth  6 each Topical Daily  . clopidogrel  75 mg Oral Daily  . cycloSPORINE  1 drop Both Eyes BID  . heparin injection (subcutaneous)  7,500 Units Subcutaneous Q8H  . insulin aspart  0-15 Units Subcutaneous TID WC  . insulin aspart  0-5 Units Subcutaneous QHS  . insulin aspart  3 Units Subcutaneous TID WC  . insulin glargine  15 Units Subcutaneous BID  . levothyroxine  50 mcg Oral Q0600  . Lifitegrast  2 drop Both Eyes BID  . linagliptin  5 mg Oral Daily  . metoprolol succinate  25 mg Oral Daily  . rosuvastatin  20 mg Oral Daily  . sodium chloride flush  10-40 mL Intracatheter Q12H  . umeclidinium-vilanterol  1 puff Inhalation Daily  . Vitamin D (Ergocalciferol)  50,000 Units Oral Q7 days   Continuous Infusions: . remdesivir 100 mg in NS 100 mL 100 mg (09/03/19 0921)  . sterile water 990.38 mL with sodium chloride 38.48 mEq infusion 75 mL/hr at 09/03/19 1030   PRN  Meds:.acetaminophen, dextrose, meclizine, nitroGLYCERIN, ondansetron **OR** ondansetron (ZOFRAN) IV, sodium chloride flush  Antimicrobials: Anti-infectives (From admission, onward)   Start     Dose/Rate Route Frequency Ordered Stop   09/01/19 1000  remdesivir 100 mg in sodium chloride 0.9 % 100 mL IVPB     100 mg 200 mL/hr over 30 Minutes Intravenous Daily 08/31/19 1547 09/05/19 0959   08/31/19 1800  remdesivir 200 mg in sodium chloride 0.9% 250 mL IVPB     200 mg 580 mL/hr over 30 Minutes Intravenous Once 08/31/19 1547 08/31/19 2023       I have personally reviewed the following labs and images: CBC: Recent Labs  Lab 08/31/19 1141 09/01/19 0019 09/02/19 0150 09/03/19 0256  WBC 8.9 11.3* 6.5 4.7  NEUTROABS 6.0  --  3.5 1.7  HGB 13.7 12.2 13.1 13.2  HCT 43.5 37.7 41.9 43.1  MCV 87.9 87.3 87.7 88.7  PLT 180 215 174 166   BMP &GFR Recent Labs  Lab 09/01/19 1330 09/01/19 1839 09/02/19 0150 09/02/19 1336 09/03/19 0256  NA 149* 148* 149* 149* 147*  K 3.7 3.9 4.4  3.9 3.7  CL 118* 117* 118* 118* 119*  CO2 21* 21* 24 22 17*  GLUCOSE 136* 142* 144* 181* 94  BUN 33* 34* 38* 44* 38*  CREATININE 1.77* 1.71* 2.00* 2.04* 1.99*  CALCIUM 9.7 9.4 9.5 9.3 9.1   Estimated Creatinine Clearance: 18 mL/min (A) (by C-G formula based on SCr of 1.99 mg/dL (H)). Liver & Pancreas: Recent Labs  Lab 08/31/19 1141 09/01/19 0736 09/02/19 0150 09/03/19 0256  AST 35 43* 77* 48*  ALT 32 28 40 35  ALKPHOS 69 60 52 45  BILITOT 0.9 0.8 0.9 0.6  PROT 8.3* 7.4 7.5 6.5  ALBUMIN 3.6 3.2* 3.0* 2.7*   Recent Labs  Lab 08/31/19 1141  LIPASE 33   Recent Labs  Lab 09/01/19 1330  AMMONIA 32   Diabetic: Recent Labs    09/01/19 1300  HGBA1C 12.0*   Recent Labs  Lab 09/02/19 0753 09/02/19 1158 09/02/19 1711 09/02/19 2128 09/03/19 0752  GLUCAP 128* 180* 180* 84 93   Cardiac Enzymes: No results for input(s): CKTOTAL, CKMB, CKMBINDEX, TROPONINI in the last 168 hours. No results for  input(s): PROBNP in the last 8760 hours. Coagulation Profile: No results for input(s): INR, PROTIME in the last 168 hours. Thyroid Function Tests: Recent Labs    09/01/19 1227 09/01/19 1228  TSH  --  3.264  FREET4 1.03  --    Lipid Profile: No results for input(s): CHOL, HDL, LDLCALC, TRIG, CHOLHDL, LDLDIRECT in the last 72 hours. Anemia Panel: Recent Labs    09/01/19 1227 09/02/19 0150 09/03/19 0256  VITAMINB12 470  --   --   FERRITIN  --  462* 356*   Urine analysis:    Component Value Date/Time   COLORURINE STRAW (A) 08/31/2019 1531   APPEARANCEUR CLEAR 08/31/2019 1531   LABSPEC 1.014 08/31/2019 1531   PHURINE 5.0 08/31/2019 1531   GLUCOSEU >=500 (A) 08/31/2019 1531   HGBUR MODERATE (A) 08/31/2019 1531   BILIRUBINUR NEGATIVE 08/31/2019 1531   KETONESUR 5 (A) 08/31/2019 1531   PROTEINUR 30 (A) 08/31/2019 1531   UROBILINOGEN 0.2 01/29/2014 1053   NITRITE NEGATIVE 08/31/2019 1531   LEUKOCYTESUR NEGATIVE 08/31/2019 1531   Sepsis Labs: Invalid input(s): PROCALCITONIN, [place]  Microbiology: Recent Results (from the past 240 hour(s))  Novel Coronavirus, NAA ([facility])     Status: Abnormal   Collection Time: 08/29/19  3:59 PM   Specimen: Nasopharyngeal(NP) swabs in vial transport medium   NASOPHARYNGE  SCREENIN  Result Value Ref Range Status   SARS-CoV-2, NAA Detected (A) Not Detected Final    Comment: This nucleic acid amplification test was developed and its performance characteristics determined by [employer]. Nucleic acid amplification tests include PCR and TMA. This test has not been FDA cleared or approved. This test has been authorized by FDA under an Emergency Use Authorization (EUA). This test is only authorized for the duration of time the declaration that circumstances exist justifying the authorization of the emergency use of in vitro diagnostic tests for detection of SARS-CoV-2 virus and/or diagnosis of COVID-19 infection under section  564(b)(1) of the Act, 21 U.S.C. 616WVP-7(T) (1), unless the authorization is terminated or revoked sooner. When diagnostic testing is negative, the possibility of a false negative result should be considered in the context of a patient's recent exposures and the presence of clinical signs and symptoms consistent with COVID-19. An individual without symptoms of COVID-19 and who is not shedding SARS-CoV-2 virus would  expect to have a negative (not detected) result in this  assay.   MRSA PCR Screening     Status: None   Collection Time: 09/01/19  3:46 AM   Specimen: Nasal Mucosa; Nasopharyngeal  Result Value Ref Range Status   MRSA by PCR NEGATIVE NEGATIVE Final    Comment:        The GeneXpert MRSA Assay (FDA approved for NASAL specimens only), is one component of a comprehensive MRSA colonization surveillance program. It is not intended to diagnose MRSA infection nor to guide or monitor treatment for MRSA infections. Performed at [facility], [place] [address], [place], [place] [address]     Radiology Studies: No results found.   Monserrate Blaschke T. [place]  If 7PM-7AM, please contact night-coverage [email or web address] Password [email or web address] 09/03/2019, 11:15 AM

## 2019-09-04 LAB — GLUCOSE, CAPILLARY
Glucose-Capillary: 85 mg/dL (ref 70–99)
Glucose-Capillary: 140 mg/dL — ABNORMAL HIGH (ref 70–99)
Glucose-Capillary: 215 mg/dL — ABNORMAL HIGH (ref 70–99)
Glucose-Capillary: 260 mg/dL — ABNORMAL HIGH (ref 70–99)

## 2019-09-04 LAB — BASIC METABOLIC PANEL
Anion gap: 8 (ref 5–15)
BUN: 35 mg/dL — ABNORMAL HIGH (ref 8–23)
CO2: 20 mmol/L — ABNORMAL LOW (ref 22–32)
Calcium: 9.3 mg/dL (ref 8.9–10.3)
Chloride: 114 mmol/L — ABNORMAL HIGH (ref 98–111)
Creatinine, Ser: 2.06 mg/dL — ABNORMAL HIGH (ref 0.44–1.00)
GFR calc Af Amer: 25 mL/min — ABNORMAL LOW (ref >60)
GFR calc non Af Amer: 22 mL/min — ABNORMAL LOW (ref >60)
Glucose, Bld: 231 mg/dL — ABNORMAL HIGH (ref 70–99)
Potassium: 3.7 mmol/L (ref 3.5–5.1)
Sodium: 142 mmol/L (ref 135–145)

## 2019-09-04 LAB — CBC WITH DIFFERENTIAL/PLATELET
Abs Immature Granulocytes: 0.04 10*3/uL (ref 0.00–0.07)
Basophils Absolute: 0 10*3/uL (ref 0.0–0.1)
Basophils Relative: 1 %
Eosinophils Absolute: 0.1 10*3/uL (ref 0.0–0.5)
Eosinophils Relative: 1 %
HCT: 37.3 % (ref 36.0–46.0)
Hemoglobin: 11.7 g/dL — ABNORMAL LOW (ref 12.0–15.0)
Immature Granulocytes: 1 %
Lymphocytes Relative: 54 %
Lymphs Abs: 2.7 10*3/uL (ref 0.7–4.0)
MCH: 27 pg (ref 26.0–34.0)
MCHC: 31.4 g/dL (ref 30.0–36.0)
MCV: 85.9 fL (ref 80.0–100.0)
Monocytes Absolute: 0.5 10*3/uL (ref 0.1–1.0)
Monocytes Relative: 9 %
Neutro Abs: 1.7 10*3/uL (ref 1.7–7.7)
Neutrophils Relative %: 34 %
Platelets: 191 10*3/uL (ref 150–400)
RBC: 4.34 MIL/uL (ref 3.87–5.11)
RDW: 14.8 % (ref 11.5–15.5)
WBC: 5.1 10*3/uL (ref 4.0–10.5)
nRBC: 0 % (ref 0.0–0.2)

## 2019-09-04 LAB — COMPREHENSIVE METABOLIC PANEL
ALT: 34 U/L (ref 0–44)
AST: 55 U/L — ABNORMAL HIGH (ref 15–41)
Albumin: 2.7 g/dL — ABNORMAL LOW (ref 3.5–5.0)
Alkaline Phosphatase: 45 U/L (ref 38–126)
Anion gap: 8 (ref 5–15)
BUN: 36 mg/dL — ABNORMAL HIGH (ref 8–23)
CO2: 19 mmol/L — ABNORMAL LOW (ref 22–32)
Calcium: 9.1 mg/dL (ref 8.9–10.3)
Chloride: 120 mmol/L — ABNORMAL HIGH (ref 98–111)
Creatinine, Ser: 2.15 mg/dL — ABNORMAL HIGH (ref 0.44–1.00)
GFR calc Af Amer: 24 mL/min — ABNORMAL LOW (ref >60)
GFR calc non Af Amer: 21 mL/min — ABNORMAL LOW (ref >60)
Glucose, Bld: 96 mg/dL (ref 70–99)
Potassium: 4.1 mmol/L (ref 3.5–5.1)
Sodium: 147 mmol/L — ABNORMAL HIGH (ref 135–145)
Total Bilirubin: 1.3 mg/dL — ABNORMAL HIGH (ref 0.3–1.2)
Total Protein: 6.2 g/dL — ABNORMAL LOW (ref 6.5–8.1)

## 2019-09-04 LAB — SODIUM, URINE, RANDOM: Sodium, Ur: 50 mmol/L

## 2019-09-04 LAB — HEMOGLOBIN A1C
Hgb A1c MFr Bld: 12 % — ABNORMAL HIGH (ref 4.8–5.6)
Mean Plasma Glucose: 297.7 mg/dL

## 2019-09-04 LAB — CREATININE, URINE, RANDOM: Creatinine, Urine: 65.85 mg/dL

## 2019-09-04 LAB — MAGNESIUM: Magnesium: 2 mg/dL (ref 1.7–2.4)

## 2019-09-04 LAB — LACTATE DEHYDROGENASE: LDH: 524 U/L — ABNORMAL HIGH (ref 98–192)

## 2019-09-04 LAB — C-REACTIVE PROTEIN: CRP: 1.7 mg/dL — ABNORMAL HIGH (ref <1.0)

## 2019-09-04 LAB — D-DIMER, QUANTITATIVE: D-Dimer, Quant: 1.53 ug/mL-FEU — ABNORMAL HIGH (ref 0.00–0.50)

## 2019-09-04 LAB — FERRITIN: Ferritin: 276 ng/mL (ref 11–307)

## 2019-09-04 MED ORDER — SPIRONOLACTONE 25 MG PO TABS
25.0000 mg | ORAL_TABLET | Freq: Once | ORAL | Status: AC
  Administered 2019-09-04: 25 mg via ORAL
  Filled 2019-09-04: qty 1

## 2019-09-04 MED ORDER — SPIRONOLACTONE 12.5 MG HALF TABLET: 12.5000 mg | ORAL_TABLET | Freq: Once | ORAL | Status: DC

## 2019-09-04 MED ORDER — INSULIN GLARGINE 100 UNIT/ML ~~LOC~~ SOLN
5.0000 [IU] | Freq: Two times a day (BID) | SUBCUTANEOUS | Status: DC
  Administered 2019-09-04 – 2019-09-05 (×2): 5 [IU] via SUBCUTANEOUS
  Filled 2019-09-04 (×3): qty 0.05

## 2019-09-04 MED ORDER — DEXTROSE 5 % IV SOLN: INTRAVENOUS | Status: DC

## 2019-09-04 NOTE — TOC Progression Note (Signed)
Transition of Care [facility]) - Progression Note    Patient Details  Name: [name] MRN: [id number] Date of Birth: 1935/08/20  Transition of Care [facility]) CM/SW Contact  Purcell Mouton, RN Phone Number: 09/04/2019, 12:59 PM  Clinical Narrative:    Pt states that she lives alone, but her daughter is always with her. Pt continue to state she will not need any HH at discharge. Will continue to follow pt for discharge needs if any.    Expected Discharge Plan: Home/Self Care    Expected Discharge Plan and Services Expected Discharge Plan: Home/Self Care   Discharge Planning Services: HF Clinic   Living arrangements for the past 2 months: Single Family Home                                       Social Determinants of Health (SDOH) Interventions    Readmission Risk Interventions No flowsheet data found.

## 2019-09-04 NOTE — Progress Notes (Signed)
  Speech Language Pathology Treatment: Dysphagia  Patient Details Name: [name] MRN: [id number] DOB: 1935-07-24 Today's Date: 09/04/2019 Time: 7412-8786 SLP Time Calculation (min) (ACUTE ONLY): 35 min  Assessment / Plan / Recommendation Clinical Impression  SlP follow up to assess po tolerance and readiness for dietary advancement.  Pt reports she always uses dentures with intake -SLP called granddaughter to request them.  Observed pt with intake of juice, pudding and cracker - prolonged mastication due to pt's edentulous - however speech is clear and no focal CN deficits. Pt able to orally transit cracker with use of pudding - dentures are being brought into the hospital by her granddaughter today.  Will advanced diet to regular as pt without dyspnea that may impact swallow/respiration reciprocity.  Advised pt to recommendations and resolution of dysphagia noted.  Thanks.    HPI HPI: [name] is a 85 y.o. female with history h/o diabetes mellitus, hypertension, CAD, asthma who lives with her sister and recently tested positive for Covid (on 1/5) presents to the ED with complaints of generalized weakness, fatigue, uncontrolled blood glucose levels and dyspnea on exertion. Found to be in DKA however per chart it is resolving but patient with acute metabolic encephalopathy and agitation now. Per chart pt independent at baseline, lives alone. Chest x-ray without any infiltrates.        SLP Plan  All goals met       Recommendations  Diet recommendations: Regular;Thin liquid Liquids provided via: Straw Medication Administration: (as tolerated) Supervision: Patient able to self feed Compensations: Slow rate;Small sips/bites Postural Changes and/or Swallow Maneuvers: Out of bed for meals                Oral Care Recommendations: Oral care BID SLP Visit Diagnosis: Dysphagia, unspecified (R13.10) Plan: All goals met       GO                Macario Golds 09/04/2019, 11:37 AM   Kathleen Lime, MS [place] Office [phone]

## 2019-09-04 NOTE — Progress Notes (Signed)
PROGRESS NOTE  [name] NIO:[id number] DOB: Dec 13, 1934   PCP: Lucianne Lei, MD  Patient is from: Home.  Quite independent at baseline.  Lives alone and performs all ADLs and also drives.  DOA: 08/31/2019 LOS: 4  Brief Narrative / Interim history: 85 year old female with history of DM-2, HTN, CAD/DES to [place] in 05/2018, asthma, lymphoproliferative disease being monitored by Dr. Burr Medico and arthritis presenting with generalized weakness, fatigue, DOE and uncontrolled blood glucose for about a week.  Reportedly noncompliant with her insulin.  She tested positive for COVID-19 on 08/28/2018.  In ED, and febrile.  HDS.  Saturation 91 to 92% on RA but desaturated to 88% when she fell asleep.  Labs consistent with DKA.  pH elevated to 4.28.  CXR without significant finding.  EKG NSR with mild LVH.  Patient admitted for DKA and tested positive for COVID-19 infection.  Started on IV fluid and insulin drip per protocol for DKA.  Started on remdesivir for COVID-19 infection.    DKA resolved.  Patient was transitioned to subcu insulin and Tradjenta. Received Actemra the next day for COVID-19 infection.  Encephalopathy work-up including CT head, ammonia, vitamin B12 and thyroid panel within normal range.  Encephalopathy resolved.  Now with hypernatremia and AKI.   Subjective: No major events overnight of this morning.  No complaint this morning.  She denies headache, chest pain, dyspnea, cough, GI or UTI symptoms.   Objective: Vitals:   09/03/19 1223 09/03/19 2027 09/04/19 0514 09/04/19 0752  BP: 111/68 128/74 (!) 151/78 123/71  Pulse: 66 69 68 70  Resp: (!) 25 (!) 24 20 18   Temp: 98.7 F (37.1 C) 98.8 F (37.1 C) 98.8 F (37.1 C) 98.6 F (37 C)  TempSrc: Oral Oral Oral Oral  SpO2: 93% 91% 93% 92%  Weight:      Height:        Intake/Output Summary (Last 24 hours) at 09/04/2019 1123 Last data filed at 09/04/2019 0300 Gross per 24 hour  Intake 1860.48 ml  Output 1600 ml  Net 260.48 ml    Filed Weights   09/01/19 0344  Weight: 59.8 kg    Examination:  GENERAL: No acute distress.  Appears well.  HEENT: MMM.  Vision and hearing grossly intact.  NECK: Supple.  No apparent JVD.  RESP:  No IWOB. Good air movement bilaterally. CVS:  RRR. Heart sounds normal.  ABD/GI/GU: Bowel sounds present. Soft. Non tender.  MSK/EXT:  Moves extremities. No apparent deformity. No edema.  SKIN: no apparent skin lesion or wound NEURO: Awake, alert and oriented appropriately.  No apparent focal neuro deficit. PSYCH: Calm. Normal affect.  Procedures:  None  Assessment & Plan: DKA in patient with uncontrolled DM-2: Likely due to noncompliance and acute illness.  DKA resolved.  A1c 12.0%. Recent Labs    09/03/19 1638 09/03/19 2025 09/04/19 0746  GLUCAP 114* 98 85  -Continue Tradjenta, NovoLog 3 units AC and SSI-moderate that she is now on D5 for hypernatremia -Decrease Lantus to 5 units twice daily -Continue Crestor.  COVID-19 infection: presented with generalized weakness, fatigue and DOE.  CXR without acute finding.  Inflammatory markers elevated but improving.  92% on room air. Recent Labs    09/02/19 0150 09/03/19 0256 09/04/19 0445  DDIMER 2.41* 1.92* 1.53*  FERRITIN 462* 356* 276  LDH 450* 416* 524*  CRP 7.1* 3.6* 1.7*  -Received Actemra 1/8. -Continue remdesivir 1/7>> -Supportive care-incentive spirometry, OOB/PT/OT -Monitor inflammatory markers  Acute metabolic encephalopathy: Likely due to the above 2.  Work-up including CT head, ammonia, TFTs and vitamin B12 not revealing. Encephalopathy resolved.  Oriented x4 except the year. Independent for ADLs and IADL's at baseline. -Treat treatable causes -Delirium and fall precautions.  Hypernatremia: Na 146> 149>147> 147 -Start D5 at 100 cc an hour. -Recheck BMP in the afternoon.  AKI on CKD-3a/azotemia: Cr 1.3 (in 03/2019)> 2.45 (admit)>> 1.92> 2.0> 2.15.  Azotemia improving. -Continue holding Benicar -Continue  monitoring - IV fluid as above. -Check FENa  Non-anion gap metabolic acidosis: Likely due to renal failure and IV fluid.  Improved. -Continue monitoring  Essential hypertension: Normotensive. -Holding amlodipine and Benicar -Continue metoprolol -IV fluid as above  CAD s/p DES to pRCA in 05/2018: Patient without chest pain.  EKG and troponin negative.  Borderline BNP -Continue home metoprolol, Plavix and aspirin -Needs a statin unless contraindicated  History of asthma -Inhalers  Hypothyroidism: TSH was normal. -Continue home Synthroid  Generalized weakness/physical deconditioning: Likely due to acute illness and uncontrolled diabetes. -PT/OT                DVT prophylaxis: Subcu heparin Code Status: Full code  Family Communication: Updated granddaughter, [facility]) 1/10. No answer on 1/11 Disposition Plan: Remains inpatient due to hypernatremia and AKI.  Final disposition after PT/OT. Consultants: None   Microbiology summarized: 08/28/2018-COVID-19 positive. MRSA PCR negative.  Sch Meds:  Scheduled Meds: . aspirin EC  81 mg Oral Daily  . Chlorhexidine Gluconate Cloth  6 each Topical Daily  . clopidogrel  75 mg Oral Daily  . cycloSPORINE  1 drop Both Eyes BID  . heparin injection (subcutaneous)  5,000 Units Subcutaneous Q8H  . insulin aspart  0-15 Units Subcutaneous TID WC  . insulin aspart  0-5 Units Subcutaneous QHS  . insulin aspart  3 Units Subcutaneous TID WC  . insulin glargine  10 Units Subcutaneous BID  . levothyroxine  50 mcg Oral Q0600  . Lifitegrast  2 drop Both Eyes BID  . linagliptin  5 mg Oral Daily  . metoprolol succinate  25 mg Oral Daily  . rosuvastatin  20 mg Oral Daily  . sodium chloride flush  10-40 mL Intracatheter Q12H  . umeclidinium-vilanterol  1 puff Inhalation Daily  . Vitamin D (Ergocalciferol)  50,000 Units Oral Q7 days   Continuous Infusions: . dextrose 100 mL/hr at 09/04/19 0816   PRN Meds:.acetaminophen,  dextrose, meclizine, nitroGLYCERIN, ondansetron **OR** ondansetron (ZOFRAN) IV, sodium chloride flush  Antimicrobials: Anti-infectives (From admission, onward)   Start     Dose/Rate Route Frequency Ordered Stop   09/01/19 1000  remdesivir 100 mg in sodium chloride 0.9 % 100 mL IVPB     100 mg 200 mL/hr over 30 Minutes Intravenous Daily 08/31/19 1547 09/04/19 0935   08/31/19 1800  remdesivir 200 mg in sodium chloride 0.9% 250 mL IVPB     200 mg 580 mL/hr over 30 Minutes Intravenous Once 08/31/19 1547 08/31/19 2023       I have personally reviewed the following labs and images: CBC: Recent Labs  Lab 08/31/19 1141 09/01/19 0019 09/02/19 0150 09/03/19 0256 09/04/19 0445  WBC 8.9 11.3* 6.5 4.7 5.1  NEUTROABS 6.0  --  3.5 1.7 1.7  HGB 13.7 12.2 13.1 13.2 11.7*  HCT 43.5 37.7 41.9 43.1 37.3  MCV 87.9 87.3 87.7 88.7 85.9  PLT 180 215 174 166 191   BMP &GFR Recent Labs  Lab 09/02/19 0150 09/02/19 1336 09/03/19 0256 09/03/19 1615 09/04/19 0445  NA 149* 149* 147* 146* 147*  K  4.4 3.9 3.7 3.4* 4.1  CL 118* 118* 119* 116* 120*  CO2 24 22 17* 22 19*  GLUCOSE 144* 181* 94 132* 96  BUN 38* 44* 38* 39* 36*  CREATININE 2.00* 2.04* 1.99* 2.12* 2.15*  CALCIUM 9.5 9.3 9.1 9.1 9.1  MG  --   --   --   --  2.0   Estimated Creatinine Clearance: 16.7 mL/min (A) (by C-G formula based on SCr of 2.15 mg/dL (H)). Liver & Pancreas: Recent Labs  Lab 08/31/19 1141 09/01/19 0736 09/02/19 0150 09/03/19 0256 09/04/19 0445  AST 35 43* 77* 48* 55*  ALT 32 28 40 35 34  ALKPHOS 69 60 52 45 45  BILITOT 0.9 0.8 0.9 0.6 1.3*  PROT 8.3* 7.4 7.5 6.5 6.2*  ALBUMIN 3.6 3.2* 3.0* 2.7* 2.7*   Recent Labs  Lab 08/31/19 1141  LIPASE 33   Recent Labs  Lab 09/01/19 1330  AMMONIA 32   Diabetic: Recent Labs    09/01/19 1300 09/04/19 0430  HGBA1C 12.0* 12.0*   Recent Labs  Lab 09/03/19 0752 09/03/19 1207 09/03/19 1638 09/03/19 2025 09/04/19 0746  GLUCAP 93 135* 114* 98 85   Cardiac  Enzymes: No results for input(s): CKTOTAL, CKMB, CKMBINDEX, TROPONINI in the last 168 hours. No results for input(s): PROBNP in the last 8760 hours. Coagulation Profile: No results for input(s): INR, PROTIME in the last 168 hours. Thyroid Function Tests: Recent Labs    09/01/19 1227 09/01/19 1228  TSH  --  3.264  FREET4 1.03  --    Lipid Profile: No results for input(s): CHOL, HDL, LDLCALC, TRIG, CHOLHDL, LDLDIRECT in the last 72 hours. Anemia Panel: Recent Labs    09/01/19 1227 09/03/19 0256 09/04/19 0445  VITAMINB12 470  --   --   FERRITIN  --  356* 276   Urine analysis:    Component Value Date/Time   COLORURINE STRAW (A) 08/31/2019 1531   APPEARANCEUR CLEAR 08/31/2019 1531   LABSPEC 1.014 08/31/2019 1531   PHURINE 5.0 08/31/2019 1531   GLUCOSEU >=500 (A) 08/31/2019 1531   HGBUR MODERATE (A) 08/31/2019 1531   BILIRUBINUR NEGATIVE 08/31/2019 1531   KETONESUR 5 (A) 08/31/2019 1531   PROTEINUR 30 (A) 08/31/2019 1531   UROBILINOGEN 0.2 01/29/2014 1053   NITRITE NEGATIVE 08/31/2019 1531   LEUKOCYTESUR NEGATIVE 08/31/2019 1531   Sepsis Labs: Invalid input(s): PROCALCITONIN, [place]  Microbiology: Recent Results (from the past 240 hour(s))  Novel Coronavirus, NAA ([facility])     Status: Abnormal   Collection Time: 08/29/19  3:59 PM   Specimen: Nasopharyngeal(NP) swabs in vial transport medium   NASOPHARYNGE  SCREENIN  Result Value Ref Range Status   SARS-CoV-2, NAA Detected (A) Not Detected Final    Comment: This nucleic acid amplification test was developed and its performance characteristics determined by [employer]. Nucleic acid amplification tests include PCR and TMA. This test has not been FDA cleared or approved. This test has been authorized by FDA under an Emergency Use Authorization (EUA). This test is only authorized for the duration of time the declaration that circumstances exist justifying the authorization of the emergency use of in  vitro diagnostic tests for detection of SARS-CoV-2 virus and/or diagnosis of COVID-19 infection under section 564(b)(1) of the Act, 21 U.S.C. 654YTK-3(T) (1), unless the authorization is terminated or revoked sooner. When diagnostic testing is negative, the possibility of a false negative result should be considered in the context of a patient's recent exposures and the presence of clinical signs and  symptoms consistent with COVID-19. An individual without symptoms of COVID-19 and who is not shedding SARS-CoV-2 virus would  expect to have a negative (not detected) result in this assay.   MRSA PCR Screening     Status: None   Collection Time: 09/01/19  3:46 AM   Specimen: Nasal Mucosa; Nasopharyngeal  Result Value Ref Range Status   MRSA by PCR NEGATIVE NEGATIVE Final    Comment:        The GeneXpert MRSA Assay (FDA approved for NASAL specimens only), is one component of a comprehensive MRSA colonization surveillance program. It is not intended to diagnose MRSA infection nor to guide or monitor treatment for MRSA infections. Performed at [facility], [place] [address] [place], [place] [address]     Radiology Studies: No results found.   Louiza Moor T. Meyer  If 7PM-7AM, please contact night-coverage [email or web address] Password [facility] 09/04/2019, 11:23 AM

## 2019-09-04 NOTE — Progress Notes (Signed)
VAST to draw BMP off midline. Unable to draw back enough blood for waste and lab despite troubleshooting and repositioning. Flushed easily with NS; IVF's restarted. Pt's nurse and lab notified.

## 2019-09-04 NOTE — Plan of Care (Signed)

## 2019-09-04 NOTE — Evaluation (Signed)
Occupational Therapy Evaluation Patient Details Name: [name] MRN: [id number] DOB: April 02, 1935 Today's Date: 09/04/2019    History of Present Illness Pt admitted Covid +ve and with blood sugar over 600   Clinical Impression   Pt admitted with COVID. Pt currently with functional limitations due to the deficits listed below (see OT Problem List).  Pt will benefit from skilled OT to increase their safety and independence with ADL and functional mobility for ADL to facilitate discharge to venue listed below.     Follow Up Recommendations  No OT follow up;Supervision/Assistance - 24 hour    Equipment Recommendations  None recommended by OT    Recommendations for Other Services       Precautions / Restrictions Precautions Precautions: Fall Restrictions Weight Bearing Restrictions: No      Mobility Bed Mobility Overal bed mobility: Modified Independent             General bed mobility comments: pt up to EOB unassisted  Transfers Overall transfer level: Needs assistance Equipment used: 1 person hand held assist Transfers: Sit to/from Stand Sit to Stand: Min assist         General transfer comment: steady assist only    Balance Overall balance assessment: Needs assistance Sitting-balance support: No upper extremity supported;Feet supported Sitting balance-Leahy Scale: Good     Standing balance support: No upper extremity supported Standing balance-Leahy Scale: Fair                             ADL either performed or assessed with clinical judgement   ADL Overall ADL's : Needs assistance/impaired                                       General ADL Comments: Pt overall min A with ADL activity.  Pt min A getting to [facility] and back to bed.  VC needed for safety     Vision Patient Visual Report: No change from baseline              Pertinent Vitals/Pain Pain Assessment: No/denies pain     Hand Dominance      Extremity/Trunk Assessment Upper Extremity Assessment Upper Extremity Assessment: Generalized weakness           Communication Communication Communication: No difficulties   Cognition Arousal/Alertness: Awake/alert Behavior During Therapy: WFL for tasks assessed/performed Overall Cognitive Status: Within Functional Limits for tasks assessed                                                Home Living Family/patient expects to be discharged to:: Private residence Living Arrangements: Alone Available Help at Discharge: Other (Comment)(other relatives) Type of Home: House Home Access: Stairs to enter [employer] of Steps: 2   Home Layout: One level               Home Equipment: None          Prior Functioning/Environment Level of Independence: Independent                 OT Problem List: Decreased strength;Decreased activity tolerance      OT Treatment/Interventions: Self-care/ADL training;Patient/family education;Therapeutic activities    OT Goals(Current goals can be found in the  care plan section) Acute Rehab OT Goals Patient Stated Goal: Regain IND OT Goal Formulation: With patient Time For Goal Achievement: 09/04/19  OT Frequency: Min 2X/week              AM-PAC OT "6 Clicks" Daily Activity     Outcome Measure Help from another person eating meals?: None Help from another person taking care of personal grooming?: A Little Help from another person toileting, which includes using toliet, bedpan, or urinal?: A Little Help from another person bathing (including washing, rinsing, drying)?: A Little Help from another person to put on and taking off regular upper body clothing?: A Little Help from another person to put on and taking off regular lower body clothing?: A Little 6 Click Score: 19   End of Session Equipment Utilized During Treatment: Gait belt Nurse Communication: Mobility status  Activity Tolerance:  Patient tolerated treatment well Patient left: in bed;with call bell/phone within reach;with nursing/sitter in room  OT Visit Diagnosis: Unsteadiness on feet (R26.81);Muscle weakness (generalized) (M62.81)                Time: 7948-0165 OT Time Calculation (min): 15 min Charges:  OT General Charges $OT Visit: 1 Visit OT Evaluation $OT Eval Moderate Complexity: 1 Mod    Kari Baars, OT Acute Rehabilitation Services [phone] Office- [phone], Edwena Felty D 09/04/2019, 6:38 PM

## 2019-09-05 DIAGNOSIS — E1165 Type 2 diabetes mellitus with hyperglycemia: Secondary | ICD-10-CM

## 2019-09-05 LAB — GLUCOSE, CAPILLARY: Glucose-Capillary: 243 mg/dL — ABNORMAL HIGH (ref 70–99)

## 2019-09-05 LAB — CBC WITH DIFFERENTIAL/PLATELET
Abs Immature Granulocytes: 0.09 10*3/uL — ABNORMAL HIGH (ref 0.00–0.07)
Basophils Absolute: 0 10*3/uL (ref 0.0–0.1)
Basophils Relative: 1 %
Eosinophils Absolute: 0.1 10*3/uL (ref 0.0–0.5)
Eosinophils Relative: 2 %
HCT: 36.4 % (ref 36.0–46.0)
Hemoglobin: 11.4 g/dL — ABNORMAL LOW (ref 12.0–15.0)
Immature Granulocytes: 1 %
Lymphocytes Relative: 56 %
Lymphs Abs: 3.9 10*3/uL (ref 0.7–4.0)
MCH: 27.7 pg (ref 26.0–34.0)
MCHC: 31.3 g/dL (ref 30.0–36.0)
MCV: 88.3 fL (ref 80.0–100.0)
Monocytes Absolute: 0.9 10*3/uL (ref 0.1–1.0)
Monocytes Relative: 12 %
Neutro Abs: 1.9 10*3/uL (ref 1.7–7.7)
Neutrophils Relative %: 28 %
Platelets: 207 10*3/uL (ref 150–400)
RBC: 4.12 MIL/uL (ref 3.87–5.11)
RDW: 14.8 % (ref 11.5–15.5)
WBC: 6.8 10*3/uL (ref 4.0–10.5)
nRBC: 0 % (ref 0.0–0.2)

## 2019-09-05 LAB — FERRITIN: Ferritin: 268 ng/mL (ref 11–307)

## 2019-09-05 LAB — D-DIMER, QUANTITATIVE: D-Dimer, Quant: 1.29 ug/mL-FEU — ABNORMAL HIGH (ref 0.00–0.50)

## 2019-09-05 LAB — COMPREHENSIVE METABOLIC PANEL
ALT: 34 U/L (ref 0–44)
AST: 43 U/L — ABNORMAL HIGH (ref 15–41)
Albumin: 2.6 g/dL — ABNORMAL LOW (ref 3.5–5.0)
Alkaline Phosphatase: 43 U/L (ref 38–126)
Anion gap: 7 (ref 5–15)
BUN: 29 mg/dL — ABNORMAL HIGH (ref 8–23)
CO2: 19 mmol/L — ABNORMAL LOW (ref 22–32)
Calcium: 9 mg/dL (ref 8.9–10.3)
Chloride: 111 mmol/L (ref 98–111)
Creatinine, Ser: 1.97 mg/dL — ABNORMAL HIGH (ref 0.44–1.00)
GFR calc Af Amer: 27 mL/min — ABNORMAL LOW (ref >60)
GFR calc non Af Amer: 23 mL/min — ABNORMAL LOW (ref >60)
Glucose, Bld: 237 mg/dL — ABNORMAL HIGH (ref 70–99)
Potassium: 3.6 mmol/L (ref 3.5–5.1)
Sodium: 137 mmol/L (ref 135–145)
Total Bilirubin: 0.8 mg/dL (ref 0.3–1.2)
Total Protein: 5.6 g/dL — ABNORMAL LOW (ref 6.5–8.1)

## 2019-09-05 LAB — MAGNESIUM: Magnesium: 1.8 mg/dL (ref 1.7–2.4)

## 2019-09-05 LAB — C-REACTIVE PROTEIN: CRP: 1 mg/dL — ABNORMAL HIGH (ref <1.0)

## 2019-09-05 LAB — LACTATE DEHYDROGENASE: LDH: 445 U/L — ABNORMAL HIGH (ref 98–192)

## 2019-09-05 MED ORDER — INSULIN GLARGINE 100 UNITS/ML SOLOSTAR PEN: 30.0000 [IU] | PEN_INJECTOR | Freq: Every day | SUBCUTANEOUS | 11 refills | Status: DC

## 2019-09-05 MED ORDER — POTASSIUM CHLORIDE CRYS ER 20 MEQ PO TBCR
40.0000 meq | EXTENDED_RELEASE_TABLET | Freq: Once | ORAL | Status: AC
  Administered 2019-09-05: 40 meq via ORAL
  Filled 2019-09-05: qty 2

## 2019-09-05 NOTE — Plan of Care (Signed)
Discharge instructions reviewed with patient and daughter, questions answered, verbalized understanding.  Patient transported to main entrance to be taken home by daughter. This RN did attempt to find patients belongings in ICU and ED.  After patient was discharged her belongings were found by the ED, called patients daughter who asked that I leave these for her granddaughter Dorinda Hill who works in short stay here and can bring them home to patient.

## 2019-09-05 NOTE — Discharge Summary (Signed)
Physician Discharge Summary  Chardonnay Holzmann XHB:[id number] DOB: 1935-06-28 DOA: 08/31/2019  PCP: Lucianne Lei, MD  Admit date: 08/31/2019 Discharge date: 09/05/2019  Admitted From: Home Disposition: Home  Recommendations for Outpatient Follow-up:  1. Follow ups as below. 2. Please obtain CBC/BMP/Mag at follow up 3. Consider referral to nephrology. 4. Please follow up on the following pending results: None  Home Health: No need identified Equipment/Devices: None  Discharge Condition: Stable CODE STATUS: Full code  Follow-up Information    Lucianne Lei, MD. Schedule an appointment as soon as possible for a visit in 2 week(s).   Specialty: Family Medicine Contact information: [address] [place] [address] [phone]        Sherren Mocha, MD .   Specialty: Cardiology Contact information: [address] [place] [address] [phone]           Hospital Course: 85 year old female with history of DM-2, HTN, CAD/DES to [place] in 05/2018, asthma, lymphoproliferative disease being monitored by Dr. Burr Medico and arthritis presenting with generalized weakness, fatigue, DOE and uncontrolled blood glucose for about a week.  Reportedly noncompliant with her insulin.  She tested positive for COVID-19 on 08/28/2018.  In ED, and febrile.  HDS.  Saturation 91 to 92% on RA but desaturated to 88% when she fell asleep.  Labs consistent with DKA.  pH elevated to 4.28.  CXR without significant finding.  EKG NSR with mild LVH.  Patient admitted for DKA, metabolic encephalopathy and tested positive for COVID-19 infection.  Started on IV fluid and insulin drip per protocol for DKA.  Started on remdesivir for COVID-19 infection.    DKA resolved.  Patient was transitioned to subcu insulin and Tradjenta. Received Actemra the next day for COVID-19 infection.  Encephalopathy work-up including CT head, ammonia, vitamin B12 and thyroid panel within normal range.   Encephalopathy eventually resolved.  Hospital course also complicated by hypernatremia and AKI on CKD-3a.  Hypernatremia resolved with D5 infusion.  AKI improved as well.  On the day of discharge, patient felt well and ready to go home.  Evaluated by therapist and no need was identified other than intermittent supervision.  See individual problem list below for more on hospital course.  Discharge Diagnoses:  DKA in patient with uncontrolled DM-2: Likely due to noncompliance and acute illness.  DKA resolved.  A1c 12.0%.  CBG elevation likely due to D5 infusion for hypernatremia. Recent Labs    09/04/19 1651 09/04/19 2104 09/05/19 0754  GLUCAP 215* 260* 243*  -Discharged on home Lantus 30 units daily and Trulicity weekly. -Discontinued home Metformin due to her renal function. -Continue home statin.  COVID-19 infection: presented with generalized weakness, fatigue and DOE.  CXR without acute finding.  Inflammatory markers improved.  Remains stable on room air. Recent Labs    09/03/19 0256 09/04/19 0445 09/05/19 0346  DDIMER 1.92* 1.53* 1.29*  FERRITIN 356* 276 268  LDH 416* 524* 445*  CRP 3.6* 1.7* 1.0*  -Received Actemra 1/8. -Continue remdesivir 1/7-1/11 -Counseled on infection prevention and return precautions.  Acute metabolic encephalopathy: Likely due to the above 2.  Work-up including CT head, ammonia, TFTs and vitamin B12 not revealing. Encephalopathy resolved.  Oriented x4.  Hypernatremia: Na 146> 149>> 137 -Recheck BMP at follow-up.  AKI on CKD-3a/azotemia: Cr 1.3 (in 03/2019)> 2.45 (admit)>> 1.92>> 2.15> 1.97.  Azotemia improved. -Recheck renal function on follow-up. -Consider referral to nephrology.  Non-anion gap metabolic acidosis: Likely due to renal failure and IV  fluid.  Improved. -Recheck at follow-up.  Essential hypertension: Normotensive. -Continue home medications.  CAD s/p DES to pRCA in 05/2018: Patient without chest pain.  EKG and troponin  negative.  Borderline BNP -Continue home medications.  History of asthma -Continue home inhalers.  Hypothyroidism: TSH was normal. -Continue home Synthroid  Generalized weakness/physical deconditioning: Likely due to acute illness and uncontrolled diabetes.  Improved.  No need identified by therapy.   Discharge Instructions  Discharge Instructions    Diet - low sodium heart healthy   Complete by: As directed    Diet Carb Modified   Complete by: As directed    Discharge instructions   Complete by: As directed    It has been a pleasure taking care of you! You were hospitalized and treated for diabetic ketoacidosis, COVID-19 infection and high sodium level.  Your diabetic ketoacidosis resolved.  Your sodium level normalized.  You completed treatment course for COVID-19 infection. We may have made some adjustments to your home medication during this hospitalization. Please review your new medication list and the directions before you take your medications.  Please follow-up with your primary care doctor and cardiologist in 1 to 2 weeks.    Although you have been treated for COVID-19 infection, you are still potentially infectious for the next 7 days. We recommend you isolate yourself and take the necessary precautions to prevent the virus from spreading.  Some of the steps to prevent the virus from spreading to others: Stay away from other and members of your household at least for 7 days. Let healthy household members care for children and pets, if possible. If you have to care for children or pets, wash your hands often and wear a mask. If possible, stay in your own room, separate from others. Use a different bathroom.Make sure that all people in your household wash their hands well and often. Leave your house only to seek medical care. Do not use public transport. Do not travel while you are sick. Wash your hands often with soap and water for 20 seconds. If soap and water are not  available, use alcohol-based hand sanitizer. Cough or sneeze into a tissue or your sleeve or elbow. Do not cough or sneeze into your hand or into the air. Wear a cloth face covering or face mask.  Return precautions: Get help or return to the hospital right away if: You have trouble breathing. You have pain or pressure in your chest. You have confusion. You have bluish lips and fingernails. You have difficulty waking from sleep. You have symptoms that get worse. These symptoms may represent a serious problem that is an emergency. Do not wait to see if the symptoms will go away. Get medical help right away. Call your local emergency services (911 in the [place] Do not drive yourself to the hospital. Let the emergency medical personnel know if you think you have COVID-19.  To protect yourself in the future:  Do not travel to areas where COVID-19 is a risk. The areas where COVID-19 is reported change often. To identify high-risk areas and travel restrictions, check the CDC travel website: FatFares.com.br If you live in, or must travel to, an area where COVID-19 is a risk, take precautions to avoid infection. Stay away from people who are sick. Wash your hands often with soap and water for 20 seconds. If soap and water are not available, use an alcohol-based hand sanitizer. Avoid touching your mouth, face, eyes, or nose. Avoid going out in public, follow  guidance from your state and local health authorities. If you must go out in public, wear a cloth face covering or face mask. Disinfect objects and surfaces that are frequently touched every day. This may include: Counters and tables. Doorknobs and light switches. Sinks and faucets. Electronics, such as phones, remote controls, keyboards, computers, and tablets.    Where to find more information Centers for Disease Control and Prevention: PurpleGadgets.be World Health Organization:  https://www.castaneda.info/   Take care,   Increase activity slowly   Complete by: As directed    MyChart COVID-19 home monitoring program   Complete by: Sep 05, 2019    Is the patient willing to use the [place] for home monitoring?: Yes   Temperature monitoring   Complete by: Sep 05, 2019    After how many days would you like to receive a notification of this patient's flowsheet entries?: 1     Allergies as of 09/05/2019   No Known Allergies     Medication List    STOP taking these medications   metFORMIN 500 MG tablet Commonly known as: GLUCOPHAGE     TAKE these medications   Accu-Chek FastClix Lancets Misc USE AS DIRECTED FOR DAILY TESTING   Accu-Chek Guide test strip Generic drug: glucose blood daily. use as directed   acetaminophen 325 MG tablet Commonly known as: TYLENOL Take by mouth every 6 (six) hours as needed for mild pain. What changed: Another medication with the same name was removed. Continue taking this medication, and follow the directions you see here.   Anoro Ellipta 62.5-25 MCG/INH Aepb Generic drug: umeclidinium-vilanterol Inhale 1 puff into the lungs daily.   aspirin EC 81 MG tablet Take 81 mg by mouth daily.   Azor 5-20 MG tablet Generic drug: amLODipine-olmesartan Take 1 tablet by mouth every morning.   clopidogrel 75 MG tablet Commonly known as: PLAVIX Take 1 tablet by mouth once daily with breakfast What changed: See the new instructions.   insulin glargine 100 unit/mL Sopn Commonly known as: LANTUS Inject 0.3 mLs (30 Units total) into the skin daily. What changed: when to take this   levothyroxine 50 MCG tablet Commonly known as: SYNTHROID Take 50 mcg by mouth daily.   meclizine 25 MG tablet Commonly known as: ANTIVERT Take 25 mg by mouth daily as needed for dizziness.   metoprolol succinate 25 MG 24 hr tablet Commonly known as: TOPROL-XL Take 1 tablet by mouth once daily   nitroGLYCERIN 0.4 MG SL  tablet Commonly known as: NITROSTAT Place 1 tablet (0.4 mg total) under the tongue every 5 (five) minutes x 3 doses as needed for chest pain.   Restasis Multidose 0.05 % ophthalmic emulsion Generic drug: cycloSPORINE Place 1 drop into both eyes 2 (two) times daily.   rosuvastatin 20 MG tablet Commonly known as: CRESTOR Take 1 tablet by mouth once daily   Trulicity 0.86 PY/1.9JK Sopn Generic drug: Dulaglutide Inject 0.75 mg into the skin once a week. Friday   Vitamin D3 1.25 MG (50000 UT) Caps Take 50,000 Units by mouth once a week. On Friday   Xiidra 5 % Soln Generic drug: Lifitegrast Place 2 drops into both eyes 2 (two) times daily.       Consultations:  None  Procedures/Studies:   CT HEAD WO CONTRAST  Result Date: 09/01/2019 CLINICAL DATA:  Altered mental status EXAM: CT HEAD WITHOUT CONTRAST TECHNIQUE: Contiguous axial images were obtained from the base of the skull through the vertex without intravenous contrast. COMPARISON:  None. FINDINGS: Brain: There is no acute intracranial hemorrhage, mass-effect, or edema. Hatchel-white differentiation is preserved. There is no extra-axial fluid collection. Patchy hypoattenuation in the supratentorial white matter is nonspecific but may reflect mild chronic microvascular ischemic changes. Ventricles and sulci are within normal limits in size and configuration. Vascular: There is atherosclerotic calcification at the skull base. Skull: Calvarium is unremarkable. Sinuses/Orbits: Minor mucosal thickening.  Left lens replacement. Other: Mastoid air cells are clear. IMPRESSION: No acute intracranial abnormality. Mild chronic microvascular ischemic changes. Electronically Signed   By: Macy Mis M.D.   On: 09/01/2019 17:58   DG Chest Portable 1 View  Result Date: 08/31/2019 CLINICAL DATA:  COVID positive, hyperglycemia EXAM: PORTABLE CHEST 1 VIEW COMPARISON:  06/14/2018 FINDINGS: The heart size and mediastinal contours are within normal  limits. Both lungs are clear. No pleural effusion. The visualized skeletal structures are unremarkable. IMPRESSION: No acute process in the chest. Electronically Signed   By: Macy Mis M.D.   On: 08/31/2019 12:18       Discharge Exam: Vitals:   09/04/19 2107 09/05/19 0507  BP: 120/74 110/67  Pulse: 68 67  Resp: 16 16  Temp: 98.2 F (36.8 C) 98.2 F (36.8 C)  SpO2: 94% 93%    GENERAL: No acute distress.  Appears well.  HEENT: MMM.  Vision and hearing grossly intact.  NECK: Supple.  No apparent JVD.  RESP:  No IWOB. Good air movement bilaterally. CVS:  RRR. Heart sounds normal.  ABD/GI/GU: Bowel sounds present. Soft. Non tender.  MSK/EXT:  Moves extremities. No apparent deformity or edema.  SKIN: no apparent skin lesion or wound NEURO: Awake, alert and oriented appropriately.  No apparent focal neuro deficit. PSYCH: Calm. Normal affect.    The results of significant diagnostics from this hospitalization (including imaging, microbiology, ancillary and laboratory) are listed below for reference.     Microbiology: Recent Results (from the past 240 hour(s))  Novel Coronavirus, NAA ([facility])     Status: Abnormal   Collection Time: 08/29/19  3:59 PM   Specimen: Nasopharyngeal(NP) swabs in vial transport medium   NASOPHARYNGE  SCREENIN  Result Value Ref Range Status   SARS-CoV-2, NAA Detected (A) Not Detected Final    Comment: This nucleic acid amplification test was developed and its performance characteristics determined by [employer]. Nucleic acid amplification tests include PCR and TMA. This test has not been FDA cleared or approved. This test has been authorized by FDA under an Emergency Use Authorization (EUA). This test is only authorized for the duration of time the declaration that circumstances exist justifying the authorization of the emergency use of in vitro diagnostic tests for detection of SARS-CoV-2 virus and/or diagnosis of COVID-19 infection  under section 564(b)(1) of the Act, 21 U.S.C. 992EQA-8(T) (1), unless the authorization is terminated or revoked sooner. When diagnostic testing is negative, the possibility of a false negative result should be considered in the context of a patient's recent exposures and the presence of clinical signs and symptoms consistent with COVID-19. An individual without symptoms of COVID-19 and who is not shedding SARS-CoV-2 virus would  expect to have a negative (not detected) result in this assay.   MRSA PCR Screening     Status: None   Collection Time: 09/01/19  3:46 AM   Specimen: Nasal Mucosa; Nasopharyngeal  Result Value Ref Range Status   MRSA by PCR NEGATIVE NEGATIVE Final    Comment:        The GeneXpert MRSA Assay (FDA approved for  NASAL specimens only), is one component of a comprehensive MRSA colonization surveillance program. It is not intended to diagnose MRSA infection nor to guide or monitor treatment for MRSA infections. Performed at [facility], [place] [address], [place], [place] [address]      Labs: BNP (last 3 results) Recent Labs    09/01/19 0736 09/01/19 1236  BNP 111.5* 95.6   Basic Metabolic Panel: Recent Labs  Lab 09/03/19 0256 09/03/19 1615 09/04/19 0445 09/04/19 1714 09/05/19 0346  NA 147* 146* 147* 142 137  K 3.7 3.4* 4.1 3.7 3.6  CL 119* 116* 120* 114* 111  CO2 17* 22 19* 20* 19*  GLUCOSE 94 132* 96 231* 237*  BUN 38* 39* 36* 35* 29*  CREATININE 1.99* 2.12* 2.15* 2.06* 1.97*  CALCIUM 9.1 9.1 9.1 9.3 9.0  MG  --   --  2.0  --  1.8   Liver Function Tests: Recent Labs  Lab 09/01/19 0736 09/02/19 0150 09/03/19 0256 09/04/19 0445 09/05/19 0346  AST 43* 77* 48* 55* 43*  ALT 28 40 35 34 34  ALKPHOS 60 52 45 45 43  BILITOT 0.8 0.9 0.6 1.3* 0.8  PROT 7.4 7.5 6.5 6.2* 5.6*  ALBUMIN 3.2* 3.0* 2.7* 2.7* 2.6*   Recent Labs  Lab 08/31/19 1141  LIPASE 33   Recent Labs  Lab 09/01/19 1330  AMMONIA 32   CBC: Recent  Labs  Lab 08/31/19 1141 09/01/19 0019 09/02/19 0150 09/03/19 0256 09/04/19 0445 09/05/19 0346  WBC 8.9 11.3* 6.5 4.7 5.1 6.8  NEUTROABS 6.0  --  3.5 1.7 1.7 1.9  HGB 13.7 12.2 13.1 13.2 11.7* 11.4*  HCT 43.5 37.7 41.9 43.1 37.3 36.4  MCV 87.9 87.3 87.7 88.7 85.9 88.3  PLT 180 215 174 166 191 207   Cardiac Enzymes: No results for input(s): CKTOTAL, CKMB, CKMBINDEX, TROPONINI in the last 168 hours. BNP: Invalid input(s): POCBNP CBG: Recent Labs  Lab 09/03/19 2025 09/04/19 0746 09/04/19 1128 09/04/19 1651 09/04/19 2104  GLUCAP 98 85 140* 215* 260*   D-Dimer Recent Labs    09/04/19 0445 09/05/19 0346  DDIMER 1.53* 1.29*   Hgb A1c Recent Labs    09/04/19 0430  HGBA1C 12.0*   Lipid Profile No results for input(s): CHOL, HDL, LDLCALC, TRIG, CHOLHDL, LDLDIRECT in the last 72 hours. Thyroid function studies No results for input(s): TSH, T4TOTAL, T3FREE, THYROIDAB in the last 72 hours.  Invalid input(s): FREET3 Anemia work up Recent Labs    09/04/19 0445 09/05/19 0346  FERRITIN 276 268   Urinalysis    Component Value Date/Time   COLORURINE STRAW (A) 08/31/2019 1531   APPEARANCEUR CLEAR 08/31/2019 1531   LABSPEC 1.014 08/31/2019 1531   PHURINE 5.0 08/31/2019 1531   GLUCOSEU >=500 (A) 08/31/2019 1531   HGBUR MODERATE (A) 08/31/2019 1531   BILIRUBINUR NEGATIVE 08/31/2019 1531   KETONESUR 5 (A) 08/31/2019 1531   PROTEINUR 30 (A) 08/31/2019 1531   UROBILINOGEN 0.2 01/29/2014 1053   NITRITE NEGATIVE 08/31/2019 1531   LEUKOCYTESUR NEGATIVE 08/31/2019 1531   Sepsis Labs Invalid input(s): PROCALCITONIN,  WBC,  LACTICIDVEN   Time coordinating discharge: 40 minutes  SIGNED:  Mercy Riding, MD  [facility] 09/05/2019, 7:30 AM  If 7PM-7AM, please contact night-coverage [email or web address] Password [email or web address]

## 2019-09-21 NOTE — Progress Notes (Signed)
Cardiology Office Note:    Date:  09/22/2019   ID:  [name], DOB 1935/05/17, MRN [id number]  PCP:  Lucianne Lei, MD  Cardiologist:  Sherren Mocha, MD  Electrophysiologist:  None   Referring MD: Lucianne Lei, MD   Chief Complaint  Patient presents with  . Hospitalization Follow-up    Admx w/ DKA, AKI on CKD, COVID-19    Patient Profile:    [name] is a 85 y.o. female with:   Coronary artery disease ? S/p NSTEMI 05/2018: DES to the proximal RCA ? Echo 05/2018: EF 60-65 with inferior hypokinesis  Diabetes mellitus  Hypertension  Hyperlipidemia  Lymphoproliferative disorder  Chronic kidney disease   Prior CV studies:  Echo 06/16/18 Mild focal basal septal hypertrophy, EF 60-65, inf-lat, inf and inf-sept HK, trivial MR, trivial TR  Cardiac Catheterization10/23/19 LM normal LAD irregs LCx ost 30 RCA prox 99 PCI: 2.5 x 18 mm [place] DES to RCA   History of Present Illness:    [name] was last seen in clinic in November 2020.  She was admitted 1/7-1/12 with diabetic ketoacidosis in the setting of COVID-19.  Her course was complicated by acute kidney injury on chronic kidney disease.  Her Creatinine stabilized. She was treated with Remdesevir and Actemra for COVID-19.    She returns for follow-up.  She is here alone.  She has been feeling good since she left the hospital.  She has not had chest discomfort or significant shortness of breath.  She has not had orthopnea, leg swelling or syncope.  She sees her primary care physician next week for follow-up.   Past Medical History:  Diagnosis Date  . Arthritis   . Asthma   . CAD (coronary artery disease)    s/p NSTEMI 10/19 - LHC:  pRCA 99, oLCx 30 >> DES to RCA; Echo 10/19: Mild focal basal septal hypertrophy, EF 60-65, inf-lat, inf and inf-sept HK, trivial MR, trivial TR  . Diabetes mellitus without complication ([place])   . Hypertension     Current Medications: Current Meds  Medication  Sig  . ACCU-CHEK FASTCLIX LANCETS MISC USE AS DIRECTED FOR DAILY TESTING  . ACCU-CHEK GUIDE test strip daily. use as directed  . acetaminophen (TYLENOL) 325 MG tablet Take by mouth every 6 (six) hours as needed for mild pain.  Marland Kitchen amLODipine-olmesartan (AZOR) 5-20 MG per tablet Take 1 tablet by mouth every morning.   . Cholecalciferol (VITAMIN D3) 50000 units CAPS Take 50,000 Units by mouth once a week. On Friday  . clopidogrel (PLAVIX) 75 MG tablet Take 75 mg by mouth daily.  . Dulaglutide (TRULICITY) 6.78 LF/8.1OF SOPN Inject 0.75 mg into the skin once a week. Friday  . insulin glargine (LANTUS) 100 unit/mL SOPN Inject 0.3 mLs (30 Units total) into the skin daily.  Marland Kitchen levothyroxine (SYNTHROID, LEVOTHROID) 50 MCG tablet Take 50 mcg by mouth daily.   . meclizine (ANTIVERT) 25 MG tablet Take 25 mg by mouth daily as needed for dizziness.   . metoprolol succinate (TOPROL-XL) 25 MG 24 hr tablet Take 25 mg by mouth daily.  . nitroGLYCERIN (NITROSTAT) 0.4 MG SL tablet Place 1 tablet (0.4 mg total) under the tongue every 5 (five) minutes x 3 doses as needed for chest pain.  Marland Kitchen RESTASIS MULTIDOSE 0.05 % ophthalmic emulsion Place 1 drop into both eyes 2 (two) times daily.   . rosuvastatin (CRESTOR) 20 MG tablet Take 20 mg by mouth daily.  Marland Kitchen Umeclidinium-Vilanterol (ANORO ELLIPTA) 62.5-25 MCG/INH AEPB Inhale  1 puff into the lungs daily.   Marland Kitchen XIIDRA 5 % SOLN Place 2 drops into both eyes 2 (two) times daily.   . [DISCONTINUED] aspirin EC 81 MG tablet Take 81 mg by mouth daily.     Allergies:   Patient has no known allergies.   Social History   Tobacco Use  . Smoking status: Former Smoker    Packs/day: 0.50    Years: 60.00    Pack years: 30.00  . Smokeless tobacco: Never Used  Substance Use Topics  . Alcohol use: No  . Drug use: No     Family Hx: The patient's family history includes Diabetes in her brother, father, mother, and sister; Heart disease in her mother and sister.  Review of Systems    HENT:       No loss of taste or smell     EKGs/Labs/Other Test Reviewed:    EKG:  EKG is not  ordered today.  The ekg ordered today demonstrates n/a  Recent Labs: 09/01/2019: B Natriuretic Peptide 96.0; TSH 3.264 09/05/2019: ALT 34; BUN 29; Creatinine, Ser 1.97; Hemoglobin 11.4; Magnesium 1.8; Platelets 207; Potassium 3.6; Sodium 137   Recent Lipid Panel Lab Results  Component Value Date/Time   CHOL 101 01/17/2019 08:23 AM   TRIG 101 01/17/2019 08:23 AM   HDL 43 01/17/2019 08:23 AM   CHOLHDL 2.3 01/17/2019 08:23 AM   CHOLHDL 2.6 06/15/2018 05:42 AM   LDLCALC 38 01/17/2019 08:23 AM    Physical Exam:    VS:  BP 132/80   Pulse 78   Ht 5\' 3"  (1.6 m)   Wt 142 lb (64.4 kg)   SpO2 96%   BMI 25.15 kg/m     Wt Readings from Last 3 Encounters:  09/22/19 142 lb (64.4 kg)  09/01/19 131 lb 13.4 oz (59.8 kg)  07/11/19 146 lb (66.2 kg)     Constitutional:      Appearance: Healthy appearance. Not in distress.  Neck:     Thyroid: Thyroid normal.     Vascular: JVD normal.  Pulmonary:     Effort: Pulmonary effort is normal.     Breath sounds: No wheezing. No rales.  Cardiovascular:     Normal rate. Regular rhythm. Normal S1. Normal S2.     Murmurs: There is a grade 2/6 crescendo-decrescendo systolic murmur at the URSB.  Abdominal:     Palpations: Abdomen is soft. There is no hepatomegaly.  Skin:    General: Skin is warm and dry.  Neurological:     Mental Status: Alert and oriented to person, place and time.     Cranial Nerves: Cranial nerves are intact.      ASSESSMENT & PLAN:    1. Coronary artery disease involving native coronary artery of native heart without angina pectoris History of non-ST elevation myocardial infarction October 2019 treated with a drug-eluting stent to the RCA.  She was recently admitted with DKA in the setting of [id number] complicated by acute kidney injury.  She is currently doing well without anginal symptoms.  After last visit, I reviewed her case  with Dr. Burt Knack and we decided to keep her on Plavix and stop her aspirin.  She is still taking aspirin.  I have advised her to go ahead and stop the aspirin now and continue on Plavix.  Continue beta-blocker, statin therapy.  2. Essential hypertension The patient's blood pressure is controlled on her current regimen.  Continue current therapy.   3. Hyperlipidemia, unspecified hyperlipidemia type LDL  optimal on most recent lab work.  Continue current Rx.    4. Stage 3b chronic kidney disease She had some improvement in her creatinine prior to discharge from the hospital.  She sees primary care next week.  She may need referral to nephrology.  5. Type 2 diabetes mellitus with complication, with long-term current use of insulin ([place]) She is now off of Metformin.  She is on Insulin, Trulicity.  Continue follow-up with primary care.  6. History of COVID-19 She has recovered without any residual symptoms.      Dispo:  Return in about 6 months (around 03/21/2020) for Routine Follow Up, w/ Dr. Burt Knack, or Richardson Dopp, PA-C, in person.   Medication Adjustments/Labs and Tests Ordered: Current medicines are reviewed at length with the patient today.  Concerns regarding medicines are outlined above.  Tests Ordered: No orders of the defined types were placed in this encounter.  Medication Changes: No orders of the defined types were placed in this encounter.   Signed, Richardson Dopp, PA-C  09/22/2019 11:49 AM    [facility] [address], [place], [place]  [address] Phone: [phone]; Fax: [phone]

## 2019-09-22 ENCOUNTER — Other Ambulatory Visit: Payer: Self-pay

## 2019-09-22 ENCOUNTER — Ambulatory Visit: Payer: Medicare Other | Admitting: Physician Assistant

## 2019-09-22 ENCOUNTER — Encounter: Payer: Self-pay | Admitting: Physician Assistant

## 2019-09-22 VITALS — BP 132/80 | HR 78 | Ht 63.0 in | Wt 142.0 lb

## 2019-09-22 DIAGNOSIS — N1832 Chronic kidney disease, stage 3b: Secondary | ICD-10-CM | POA: Diagnosis not present

## 2019-09-22 DIAGNOSIS — I10 Essential (primary) hypertension: Secondary | ICD-10-CM

## 2019-09-22 DIAGNOSIS — E118 Type 2 diabetes mellitus with unspecified complications: Secondary | ICD-10-CM | POA: Diagnosis not present

## 2019-09-22 DIAGNOSIS — Z794 Long term (current) use of insulin: Secondary | ICD-10-CM

## 2019-09-22 DIAGNOSIS — I2510 Atherosclerotic heart disease of native coronary artery without angina pectoris: Secondary | ICD-10-CM | POA: Diagnosis not present

## 2019-09-22 DIAGNOSIS — E785 Hyperlipidemia, unspecified: Secondary | ICD-10-CM | POA: Diagnosis not present

## 2019-09-22 DIAGNOSIS — Z8616 Personal history of COVID-19: Secondary | ICD-10-CM

## 2019-09-22 NOTE — Patient Instructions (Signed)
Medication Instructions:   Your physician has recommended you make the following change in your medication:   1) Stop Aspirin   *If you need a refill on your cardiac medications before your next appointment, please call your pharmacy*  Lab Work:  None ordered today  If you have labs (blood work) drawn today and your tests are completely normal, you will receive your results only by: Marland Kitchen MyChart Message (if you have MyChart) OR . A paper copy in the mail If you have any lab test that is abnormal or we need to change your treatment, we will call you to review the results.  Testing/Procedures:  None ordered today  Follow-Up: At [facility], you and your health needs are our priority.  As part of our continuing mission to provide you with exceptional heart care, we have created designated Provider Care Teams.  These Care Teams include your primary Cardiologist (physician) and Advanced Practice Providers (APPs -  Physician Assistants and Nurse Practitioners) who all work together to provide you with the care you need, when you need it.  Your next appointment:   6 month(s)  The format for your next appointment:   In Person  Provider:   You may see Sherren Mocha, MD or one of the following Advanced Practice Providers on your designated Care Team:    Richardson Dopp, PA-C  Vin Fairview, [place]  Daune Perch, [place]

## 2019-09-27 ENCOUNTER — Ambulatory Visit: Payer: Medicare Other | Attending: Internal Medicine

## 2019-09-27 ENCOUNTER — Other Ambulatory Visit: Payer: Self-pay

## 2019-09-27 DIAGNOSIS — Z20822 Contact with and (suspected) exposure to covid-19: Secondary | ICD-10-CM

## 2019-09-28 LAB — NOVEL CORONAVIRUS, NAA: SARS-CoV-2, NAA: NOT DETECTED

## 2019-09-30 ENCOUNTER — Other Ambulatory Visit: Payer: Self-pay | Admitting: Physician Assistant

## 2019-10-27 DIAGNOSIS — I10 Essential (primary) hypertension: Secondary | ICD-10-CM | POA: Diagnosis not present

## 2019-10-27 DIAGNOSIS — E782 Mixed hyperlipidemia: Secondary | ICD-10-CM | POA: Diagnosis not present

## 2019-10-27 DIAGNOSIS — E1169 Type 2 diabetes mellitus with other specified complication: Secondary | ICD-10-CM | POA: Diagnosis not present

## 2019-10-30 DIAGNOSIS — I10 Essential (primary) hypertension: Secondary | ICD-10-CM | POA: Diagnosis not present

## 2019-10-30 DIAGNOSIS — E782 Mixed hyperlipidemia: Secondary | ICD-10-CM | POA: Diagnosis not present

## 2019-10-30 DIAGNOSIS — H409 Unspecified glaucoma: Secondary | ICD-10-CM | POA: Diagnosis not present

## 2019-10-30 DIAGNOSIS — J449 Chronic obstructive pulmonary disease, unspecified: Secondary | ICD-10-CM | POA: Diagnosis not present

## 2019-12-11 ENCOUNTER — Encounter: Payer: Self-pay | Admitting: Nurse Practitioner

## 2019-12-11 ENCOUNTER — Other Ambulatory Visit: Payer: Self-pay

## 2019-12-11 ENCOUNTER — Ambulatory Visit (INDEPENDENT_AMBULATORY_CARE_PROVIDER_SITE_OTHER): Payer: Medicare Other | Admitting: Nurse Practitioner

## 2019-12-11 VITALS — BP 122/78 | HR 76 | Temp 98.1°F | Ht 61.2 in | Wt 144.0 lb

## 2019-12-11 DIAGNOSIS — E039 Hypothyroidism, unspecified: Secondary | ICD-10-CM

## 2019-12-11 DIAGNOSIS — Z794 Long term (current) use of insulin: Secondary | ICD-10-CM

## 2019-12-11 DIAGNOSIS — I10 Essential (primary) hypertension: Secondary | ICD-10-CM | POA: Diagnosis not present

## 2019-12-11 DIAGNOSIS — I2510 Atherosclerotic heart disease of native coronary artery without angina pectoris: Secondary | ICD-10-CM | POA: Diagnosis not present

## 2019-12-11 DIAGNOSIS — E118 Type 2 diabetes mellitus with unspecified complications: Secondary | ICD-10-CM | POA: Diagnosis not present

## 2019-12-11 DIAGNOSIS — D479 Neoplasm of uncertain behavior of lymphoid, hematopoietic and related tissue, unspecified: Secondary | ICD-10-CM

## 2019-12-11 DIAGNOSIS — Z8616 Personal history of COVID-19: Secondary | ICD-10-CM

## 2019-12-11 MED ORDER — TRULICITY 1.5 MG/0.5ML ~~LOC~~ SOAJ: 1.5000 mg | SUBCUTANEOUS | 1 refills | Status: DC

## 2019-12-11 MED ORDER — TRULICITY 0.75 MG/0.5ML ~~LOC~~ SOAJ: 1.5000 mg | SUBCUTANEOUS | 1 refills | Status: DC

## 2019-12-11 NOTE — Progress Notes (Signed)
This visit occurred during the SARS-CoV-2 public health emergency.  Safety protocols were in place, including screening questions prior to the visit, additional usage of staff PPE, and extensive cleaning of exam room while observing appropriate contact time as indicated for disinfecting solutions.  Subjective:     Patient ID: [name] , female    DOB: 1935-07-24 , 85 y.o.   MRN: [id number]   Chief Complaint  Patient presents with  . Establish Care    HPI  Here to establish care - she was going to Dr. Criss Rosales her daughter wanted her to transfer care. Her daughter in law is a patient here Alonza Bogus. She is single, she has 3 children 2 daughters Juliann Pulse ware) and one son.  She used to work at [employer] (retired at age 74), she also worked as a Quarry manager.  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   PMH - COPD - no specialist (smoking up to 2 years for 60 years), MI - 2019.  HTN, DM.   She will see the eye doctor every 6 months (was going to Dr. Doug Sou until he retired) - she has been going to Dr. Venetia Maxon for her eye exams.  She used to go to podiatrist (Dr. Magnus Ivan) insurance stopped covering.  [place].  She had covid in January 2020.   She is scheduled to go to the cancer center on May 9th.    Diabetes She presents for her follow-up diabetic visit. She has type 2 diabetes mellitus. There are no hypoglycemic associated symptoms. Pertinent negatives for hypoglycemia include no confusion, dizziness or nervousness/anxiousness. There are no diabetic associated symptoms. Pertinent negatives for diabetes include no blurred vision, no chest pain, no fatigue, no polydipsia, no polyphagia and no polyuria. She is following a generally healthy diet. She has not had a previous visit with a dietitian. She rarely participates in exercise. (This am her blood sugar was 353 taking Trulicity - 1.'5mg'$ .  Her blood sugar went up to 700 when she had covid) An ACE  inhibitor/angiotensin II receptor blocker is being taken. She does not see a podiatrist.Eye exam is current.     Past Medical History:  Diagnosis Date  . Arthritis   . Asthma   . CAD (coronary artery disease)    s/p NSTEMI 10/19 - LHC:  pRCA 99, oLCx 30 >> DES to RCA; Echo 10/19: Mild focal basal septal hypertrophy, EF 60-65, inf-lat, inf and inf-sept HK, trivial MR, trivial TR  . Diabetes mellitus without complication ([place])   . Hypertension      Family History  Problem Relation Age of Onset  . Diabetes Mother   . Heart disease Mother   . Diabetes Father   . Diabetes Sister   . Heart disease Sister   . Diabetes Brother      Current Outpatient Medications:  .  ACCU-CHEK FASTCLIX LANCETS MISC, USE AS DIRECTED FOR DAILY TESTING, Disp: , Rfl:  .  ACCU-CHEK GUIDE test strip, daily. use as directed, Disp: , Rfl:  .  acetaminophen (TYLENOL) 325 MG tablet, Take by mouth every 6 (six) hours as needed for mild pain., Disp: , Rfl:  .  amLODipine-olmesartan (AZOR) 5-20 MG per tablet, Take 1 tablet by mouth every morning. , Disp: , Rfl:  .  Cholecalciferol (VITAMIN D3) 50000 units CAPS, Take 50,000 Units by mouth once a week. On Friday, Disp: , Rfl: 1 .  clopidogrel (PLAVIX) 75 MG  tablet, Take 1 tablet by mouth once daily with breakfast, Disp: 90 tablet, Rfl: 3 .  Dulaglutide (TRULICITY) 3.64 WO/0.3OZ SOPN, Inject 0.75 mg into the skin once a week. Friday, Disp: , Rfl:  .  insulin glargine (LANTUS) 100 unit/mL SOPN, Inject 0.3 mLs (30 Units total) into the skin daily., Disp: 15 mL, Rfl: 11 .  levothyroxine (SYNTHROID, LEVOTHROID) 50 MCG tablet, Take 50 mcg by mouth daily. , Disp: , Rfl:  .  meclizine (ANTIVERT) 25 MG tablet, Take 25 mg by mouth daily as needed for dizziness. , Disp: , Rfl:  .  metoprolol succinate (TOPROL-XL) 25 MG 24 hr tablet, Take 25 mg by mouth daily., Disp: , Rfl:  .  nitroGLYCERIN (NITROSTAT) 0.4 MG SL tablet, Place 1 tablet (0.4 mg total) under the tongue every 5 (five)  minutes x 3 doses as needed for chest pain., Disp: 25 tablet, Rfl: 1 .  rosuvastatin (CRESTOR) 20 MG tablet, Take 20 mg by mouth daily., Disp: , Rfl:  .  Umeclidinium-Vilanterol (ANORO ELLIPTA) 62.5-25 MCG/INH AEPB, Inhale 1 puff into the lungs daily. , Disp: , Rfl:  .  XIIDRA 5 % SOLN, Place 2 drops into both eyes 2 (two) times daily. , Disp: , Rfl:  .  RESTASIS MULTIDOSE 0.05 % ophthalmic emulsion, Place 1 drop into both eyes 2 (two) times daily. , Disp: , Rfl:    No Known Allergies   Review of Systems  Constitutional: Negative.  Negative for fatigue.  Eyes: Negative for blurred vision.  Respiratory: Negative.   Cardiovascular: Negative.  Negative for chest pain, palpitations and leg swelling.  Gastrointestinal: Negative.   Endocrine: Negative.  Negative for polydipsia, polyphagia and polyuria.  Skin: Negative.   Neurological: Negative.  Negative for dizziness.  Psychiatric/Behavioral: Negative for confusion. The patient is not nervous/anxious.      Today's Vitals   12/11/19 1118  BP: 122/78  Pulse: 76  Temp: 98.1 F (36.7 C)  TempSrc: Oral  Weight: 144 lb (65.3 kg)  Height: 5' 1.2" (1.554 m)  PainSc: 0-No pain   Body mass index is 27.03 kg/m.   Objective:  Physical Exam Vitals reviewed.  Constitutional:      Appearance: Normal appearance.  Cardiovascular:     Rate and Rhythm: Normal rate and regular rhythm.     Pulses: Normal pulses.     Heart sounds: Normal heart sounds. No murmur.  Pulmonary:     Effort: Pulmonary effort is normal. No respiratory distress.     Breath sounds: Normal breath sounds.  Skin:    Capillary Refill: Capillary refill takes less than 2 seconds.  Neurological:     General: No focal deficit present.     Mental Status: She is alert and oriented to person, place, and time.  Psychiatric:        Mood and Affect: Mood normal.        Behavior: Behavior normal.        Thought Content: Thought content normal.        Judgment: Judgment normal.          Assessment And Plan:     1. Essential hypertension  Chronic, good control  Continue with current medications - CBC  2. Type 2 diabetes mellitus with complication, with long-term current use of insulin (HCC)  Chronic, will send prescription for trulicity  - Lipid panel - CMP14+EGFR - Hemoglobin A1c - CBC - Dulaglutide (TRULICITY) 1.5 YY/4.8GN SOPN; Inject 1.5 mg into the skin once a week.  Dispense:  12 pen; Refill: 1  3. Coronary artery disease involving native coronary artery of native heart without angina pectoris  She is being followed by Dr. Kathlen Mody with Cardiology  4. Acquired hypothyroidism  Chronic, will check thyroid levels. - TSH - T4 - T3, free  5. History of COVID-19  She had covid in January 2021 when she was admitted to the hospital.    Overall she feels she is doing well since then  [name], FNP    THE PATIENT IS ENCOURAGED TO PRACTICE SOCIAL DISTANCING DUE TO THE COVID-19 PANDEMIC.

## 2019-12-12 LAB — CBC
Hematocrit: 41.3 % (ref 34.0–46.6)
Hemoglobin: 13.2 g/dL (ref 11.1–15.9)
MCH: 28 pg (ref 26.6–33.0)
MCHC: 32 g/dL (ref 31.5–35.7)
MCV: 88 fL (ref 79–97)
Platelets: 230 10*3/uL (ref 150–450)
RBC: 4.72 x10E6/uL (ref 3.77–5.28)
RDW: 13.5 % (ref 11.7–15.4)
WBC: 8.9 10*3/uL (ref 3.4–10.8)

## 2019-12-12 LAB — CMP14+EGFR
ALT: 34 IU/L — ABNORMAL HIGH (ref 0–32)
AST: 25 IU/L (ref 0–40)
Albumin/Globulin Ratio: 1.1 — ABNORMAL LOW (ref 1.2–2.2)
Albumin: 4 g/dL (ref 3.6–4.6)
Alkaline Phosphatase: 77 IU/L (ref 39–117)
BUN/Creatinine Ratio: 9 — ABNORMAL LOW (ref 12–28)
BUN: 13 mg/dL (ref 8–27)
Bilirubin Total: 0.6 mg/dL (ref 0.0–1.2)
CO2: 25 mmol/L (ref 20–29)
Calcium: 10.1 mg/dL (ref 8.7–10.3)
Chloride: 102 mmol/L (ref 96–106)
Creatinine, Ser: 1.41 mg/dL — ABNORMAL HIGH (ref 0.57–1.00)
GFR calc Af Amer: 40 mL/min/{1.73_m2} — ABNORMAL LOW (ref >59)
GFR calc non Af Amer: 35 mL/min/{1.73_m2} — ABNORMAL LOW (ref >59)
Globulin, Total: 3.5 g/dL (ref 1.5–4.5)
Glucose: 121 mg/dL — ABNORMAL HIGH (ref 65–99)
Potassium: 4.7 mmol/L (ref 3.5–5.2)
Sodium: 142 mmol/L (ref 134–144)
Total Protein: 7.5 g/dL (ref 6.0–8.5)

## 2019-12-12 LAB — LIPID PANEL
Chol/HDL Ratio: 2.9 ratio (ref 0.0–4.4)
Cholesterol, Total: 137 mg/dL (ref 100–199)
HDL: 47 mg/dL (ref >39)
LDL Chol Calc (NIH): 57 mg/dL (ref 0–99)
Triglycerides: 203 mg/dL — ABNORMAL HIGH (ref 0–149)
VLDL Cholesterol Cal: 33 mg/dL (ref 5–40)

## 2019-12-12 LAB — T4: T4, Total: 7.6 ug/dL (ref 4.5–12.0)

## 2019-12-12 LAB — HEMOGLOBIN A1C
Est. average glucose Bld gHb Est-mCnc: 212 mg/dL
Hgb A1c MFr Bld: 9 % — ABNORMAL HIGH (ref 4.8–5.6)

## 2019-12-12 LAB — T3, FREE: T3, Free: 2.7 pg/mL (ref 2.0–4.4)

## 2019-12-12 LAB — TSH: TSH: 0.983 u[IU]/mL (ref 0.450–4.500)

## 2019-12-19 ENCOUNTER — Other Ambulatory Visit: Payer: Self-pay | Admitting: Nurse Practitioner

## 2019-12-19 DIAGNOSIS — E118 Type 2 diabetes mellitus with unspecified complications: Secondary | ICD-10-CM

## 2019-12-19 DIAGNOSIS — I10 Essential (primary) hypertension: Secondary | ICD-10-CM

## 2019-12-19 DIAGNOSIS — N184 Chronic kidney disease, stage 4 (severe): Secondary | ICD-10-CM

## 2019-12-19 NOTE — Progress Notes (Signed)
I have placed a referral for her to the Nephrologist

## 2019-12-22 DIAGNOSIS — Z794 Long term (current) use of insulin: Secondary | ICD-10-CM | POA: Diagnosis not present

## 2019-12-22 DIAGNOSIS — I10 Essential (primary) hypertension: Secondary | ICD-10-CM | POA: Diagnosis not present

## 2019-12-22 DIAGNOSIS — E118 Type 2 diabetes mellitus with unspecified complications: Secondary | ICD-10-CM | POA: Diagnosis not present

## 2019-12-22 DIAGNOSIS — E785 Hyperlipidemia, unspecified: Secondary | ICD-10-CM | POA: Diagnosis not present

## 2020-01-04 ENCOUNTER — Encounter: Payer: Self-pay | Admitting: Nurse Practitioner

## 2020-02-02 ENCOUNTER — Other Ambulatory Visit: Payer: Self-pay | Admitting: Nurse Practitioner

## 2020-02-02 DIAGNOSIS — E118 Type 2 diabetes mellitus with unspecified complications: Secondary | ICD-10-CM

## 2020-02-02 MED ORDER — INSULIN GLARGINE 100 UNITS/ML SOLOSTAR PEN: 30.0000 [IU] | PEN_INJECTOR | Freq: Every day | SUBCUTANEOUS | 3 refills | Status: DC

## 2020-02-02 MED ORDER — TRULICITY 1.5 MG/0.5ML ~~LOC~~ SOAJ: 1.5000 mg | SUBCUTANEOUS | 1 refills | Status: DC

## 2020-02-02 MED ORDER — MECLIZINE HCL 25 MG PO TABS: 25.0000 mg | ORAL_TABLET | Freq: Every day | ORAL | 2 refills | Status: DC | PRN

## 2020-02-07 ENCOUNTER — Other Ambulatory Visit: Payer: Self-pay

## 2020-02-07 DIAGNOSIS — E118 Type 2 diabetes mellitus with unspecified complications: Secondary | ICD-10-CM

## 2020-02-07 DIAGNOSIS — Z794 Long term (current) use of insulin: Secondary | ICD-10-CM

## 2020-02-07 MED ORDER — INSULIN GLARGINE 100 UNITS/ML SOLOSTAR PEN: 30.0000 [IU] | PEN_INJECTOR | Freq: Every day | SUBCUTANEOUS | 3 refills | Status: DC

## 2020-02-19 ENCOUNTER — Other Ambulatory Visit: Payer: Self-pay

## 2020-02-19 MED ORDER — LEVOTHYROXINE SODIUM 50 MCG PO TABS: 50.0000 ug | ORAL_TABLET | Freq: Every day | ORAL | 0 refills | Status: DC

## 2020-02-19 MED ORDER — ANORO ELLIPTA 62.5-25 MCG/INH IN AEPB: 1.0000 | INHALATION_SPRAY | Freq: Every day | RESPIRATORY_TRACT | 0 refills | Status: DC

## 2020-03-04 ENCOUNTER — Other Ambulatory Visit: Payer: Self-pay

## 2020-03-04 DIAGNOSIS — E118 Type 2 diabetes mellitus with unspecified complications: Secondary | ICD-10-CM

## 2020-03-04 DIAGNOSIS — Z794 Long term (current) use of insulin: Secondary | ICD-10-CM

## 2020-03-04 MED ORDER — INSULIN GLARGINE 100 UNITS/ML SOLOSTAR PEN: 30.0000 [IU] | PEN_INJECTOR | Freq: Every day | SUBCUTANEOUS | 3 refills | Status: DC

## 2020-03-04 MED ORDER — TRULICITY 1.5 MG/0.5ML ~~LOC~~ SOAJ: 1.5000 mg | SUBCUTANEOUS | 1 refills | Status: DC

## 2020-03-11 NOTE — Progress Notes (Signed)
This visit occurred during the SARS-CoV-2 public health emergency.  Safety protocols were in place, including screening questions prior to the visit, additional usage of staff PPE, and extensive cleaning of exam room while observing appropriate contact time as indicated for disinfecting solutions.  Subjective:     Patient ID: [name] , female    DOB: 1935/05/09 , 85 y.o.   MRN: [id number]   Chief Complaint  Patient presents with  . Hypertension    HPI  She is scheduled to see Dr. Burt Knack and Dr. Kathlen Mody on Aug 2nd, on Aug 9th goes to cancer center for lymphoproliferative disease this is her last year for follow up she believes.  She is scheduled to see the ophthalmologist next month as well  Hypertension This is a chronic problem. The current episode started more than 1 year ago. The problem has been gradually improving since onset. The problem is controlled. Pertinent negatives include no anxiety or chest pain. There are no associated agents to hypertension. Past treatments include angiotensin blockers, calcium channel blockers and beta blockers. There are no compliance problems.  Hypertensive end-organ damage includes kidney disease and CAD/MI. There is no history of angina. Identifiable causes of hypertension include chronic renal disease and a thyroid problem.     Past Medical History:  Diagnosis Date  . Arthritis   . Asthma   . CAD (coronary artery disease)    s/p NSTEMI 10/19 - LHC:  pRCA 99, oLCx 30 >> DES to RCA; Echo 10/19: Mild focal basal septal hypertrophy, EF 60-65, inf-lat, inf and inf-sept HK, trivial MR, trivial TR  . Diabetes mellitus without complication ([place])   . Hypertension      Family History  Problem Relation Age of Onset  . Diabetes Mother   . Heart disease Mother   . Diabetes Father   . Diabetes Sister   . Heart disease Sister   . Diabetes Brother      Current Outpatient Medications:  .  ACCU-CHEK FASTCLIX LANCETS MISC, USE AS DIRECTED FOR  DAILY TESTING, Disp: , Rfl:  .  ACCU-CHEK GUIDE test strip, daily. use as directed, Disp: , Rfl:  .  acetaminophen (TYLENOL) 325 MG tablet, Take by mouth every 6 (six) hours as needed for mild pain., Disp: , Rfl:  .  amLODipine-olmesartan (AZOR) 5-20 MG per tablet, Take 1 tablet by mouth every morning. , Disp: , Rfl:  .  Cholecalciferol (VITAMIN D3) 50000 units CAPS, Take 50,000 Units by mouth once a week. On Friday, Disp: , Rfl: 1 .  clopidogrel (PLAVIX) 75 MG tablet, Take 1 tablet by mouth once daily with breakfast, Disp: 90 tablet, Rfl: 3 .  Dulaglutide (TRULICITY) 1.5 HE/5.2DP SOPN, Inject 0.5 mLs (1.5 mg total) into the skin once a week., Disp: 12 pen, Rfl: 1 .  insulin glargine (LANTUS) 100 unit/mL SOPN, Inject 0.3 mLs (30 Units total) into the skin daily., Disp: 15 mL, Rfl: 3 .  levothyroxine (SYNTHROID) 50 MCG tablet, Take 1 tablet (50 mcg total) by mouth daily., Disp: 90 tablet, Rfl: 0 .  metoprolol succinate (TOPROL-XL) 25 MG 24 hr tablet, Take 25 mg by mouth daily., Disp: , Rfl:  .  nitroGLYCERIN (NITROSTAT) 0.4 MG SL tablet, Place 1 tablet (0.4 mg total) under the tongue every 5 (five) minutes x 3 doses as needed for chest pain., Disp: 25 tablet, Rfl: 1 .  rosuvastatin (CRESTOR) 20 MG tablet, Take 20 mg by mouth daily., Disp: , Rfl:  .  XIIDRA 5 % SOLN,  Place 2 drops into both eyes 2 (two) times daily. , Disp: , Rfl:  .  ANORO ELLIPTA 62.5-25 MCG/INH AEPB, Inhale 1 puff by mouth once daily, Disp: 60 each, Rfl: 0 .  meclizine (ANTIVERT) 25 MG tablet, Take 1 tablet (25 mg total) by mouth daily as needed for dizziness. (Patient not taking: Reported on 03/11/2020), Disp: 30 tablet, Rfl: 2   No Known Allergies   Review of Systems  Constitutional: Negative.  Negative for fatigue.  Respiratory: Negative.   Cardiovascular: Negative.  Negative for chest pain.  Gastrointestinal: Negative.   Endocrine: Negative.  Negative for polydipsia, polyphagia and polyuria.  Skin: Negative.    Neurological: Negative.  Negative for dizziness.  Psychiatric/Behavioral: Negative.  Negative for confusion. The patient is not nervous/anxious.      Today's Vitals   03/12/20 0847  BP: 140/78  Pulse: 76  Temp: 98.5 F (36.9 C)  TempSrc: Oral  Weight: 147 lb (66.7 kg)  Height: 5' 1.2" (1.554 m)  PainSc: 0-No pain   Body mass index is 27.59 kg/m.   Objective:  Physical Exam Vitals reviewed.  Constitutional:      General: She is not in acute distress.    Appearance: Normal appearance.  Cardiovascular:     Rate and Rhythm: Normal rate and regular rhythm.     Pulses: Normal pulses.     Heart sounds: Normal heart sounds. No murmur heard.   Pulmonary:     Effort: Pulmonary effort is normal. No respiratory distress.     Breath sounds: Normal breath sounds.  Skin:    General: Skin is warm and dry.     Capillary Refill: Capillary refill takes less than 2 seconds.  Neurological:     General: No focal deficit present.     Mental Status: She is alert and oriented to person, place, and time.     Cranial Nerves: No cranial nerve deficit.  Psychiatric:        Mood and Affect: Mood normal.        Behavior: Behavior normal.        Thought Content: Thought content normal.        Judgment: Judgment normal.         Assessment And Plan:     1. Essential hypertension Comments: Chronic, well controlled Continue with current medications  2. Type 2 diabetes mellitus with complication, with long-term current use of insulin (HCC) Comments: Chronic, I had to clarify with her the amount of Trulicity should be 1.5 mg I will speak with her daughter via phone to ensure she is aware of the dose. Will refer to podiatry since [facility] no longer takes her insurance - Ambulatory referral to Podiatry  3. Acquired hypothyroidism Comments: Chronic, stable Continue current medications pending labs. - T3, free - T4 - TSH  4. Stage 4 chronic kidney disease (HCC) Comments: Encouraged to  stay well hydrated avoid nephrotoxic medications - Hemoglobin A1c - CMP14+EGFR  5. Vitamin D deficiency Comments: will check vitamin d  - VITAMIN D 25 Hydroxy (Vit-D Deficiency, Fractures)  6. Lymphoproliferative disease ([place]) Comments: - she is followed by Oncology yearly, next appt in next couple months     Patient was given opportunity to ask questions. Patient verbalized understanding of the plan and was able to repeat key elements of the plan. All questions were answered to their satisfaction.  [name], FNP   I, [name], FNP, have reviewed all documentation for this visit. The documentation on 03/12/20 for  the exam, diagnosis, procedures, and orders are all accurate and complete.  THE PATIENT IS ENCOURAGED TO PRACTICE SOCIAL DISTANCING DUE TO THE COVID-19 PANDEMIC.

## 2020-03-12 ENCOUNTER — Encounter: Payer: Self-pay | Admitting: Nurse Practitioner

## 2020-03-12 ENCOUNTER — Other Ambulatory Visit: Payer: Self-pay | Admitting: Nurse Practitioner

## 2020-03-12 ENCOUNTER — Ambulatory Visit (INDEPENDENT_AMBULATORY_CARE_PROVIDER_SITE_OTHER): Payer: Medicare Other | Admitting: Nurse Practitioner

## 2020-03-12 ENCOUNTER — Other Ambulatory Visit: Payer: Self-pay

## 2020-03-12 VITALS — BP 140/78 | HR 76 | Temp 98.5°F | Ht 61.2 in | Wt 147.0 lb

## 2020-03-12 DIAGNOSIS — Z794 Long term (current) use of insulin: Secondary | ICD-10-CM

## 2020-03-12 DIAGNOSIS — E039 Hypothyroidism, unspecified: Secondary | ICD-10-CM | POA: Diagnosis not present

## 2020-03-12 DIAGNOSIS — N184 Chronic kidney disease, stage 4 (severe): Secondary | ICD-10-CM | POA: Diagnosis not present

## 2020-03-12 DIAGNOSIS — E559 Vitamin D deficiency, unspecified: Secondary | ICD-10-CM

## 2020-03-12 DIAGNOSIS — R7309 Other abnormal glucose: Secondary | ICD-10-CM | POA: Diagnosis not present

## 2020-03-12 DIAGNOSIS — D479 Neoplasm of uncertain behavior of lymphoid, hematopoietic and related tissue, unspecified: Secondary | ICD-10-CM

## 2020-03-12 DIAGNOSIS — I10 Essential (primary) hypertension: Secondary | ICD-10-CM

## 2020-03-12 DIAGNOSIS — E118 Type 2 diabetes mellitus with unspecified complications: Secondary | ICD-10-CM | POA: Diagnosis not present

## 2020-03-12 NOTE — Patient Instructions (Signed)
·   If you have Trulicity 6.19 mg take 2 injections once a week but if you have the 1.5 mg take only one injection, we increased the dose after your last visit.

## 2020-03-13 LAB — CMP14+EGFR
ALT: 28 IU/L (ref 0–32)
AST: 23 IU/L (ref 0–40)
Albumin/Globulin Ratio: 1.3 (ref 1.2–2.2)
Albumin: 4.2 g/dL (ref 3.6–4.6)
Alkaline Phosphatase: 80 IU/L (ref 48–121)
BUN/Creatinine Ratio: 6 — ABNORMAL LOW (ref 12–28)
BUN: 8 mg/dL (ref 8–27)
Bilirubin Total: 0.7 mg/dL (ref 0.0–1.2)
CO2: 26 mmol/L (ref 20–29)
Calcium: 9.8 mg/dL (ref 8.7–10.3)
Chloride: 102 mmol/L (ref 96–106)
Creatinine, Ser: 1.25 mg/dL — ABNORMAL HIGH (ref 0.57–1.00)
GFR calc Af Amer: 46 mL/min/{1.73_m2} — ABNORMAL LOW (ref >59)
GFR calc non Af Amer: 40 mL/min/{1.73_m2} — ABNORMAL LOW (ref >59)
Globulin, Total: 3.2 g/dL (ref 1.5–4.5)
Glucose: 170 mg/dL — ABNORMAL HIGH (ref 65–99)
Potassium: 4.7 mmol/L (ref 3.5–5.2)
Sodium: 140 mmol/L (ref 134–144)
Total Protein: 7.4 g/dL (ref 6.0–8.5)

## 2020-03-13 LAB — HEMOGLOBIN A1C
Est. average glucose Bld gHb Est-mCnc: 206 mg/dL
Hgb A1c MFr Bld: 8.8 % — ABNORMAL HIGH (ref 4.8–5.6)

## 2020-03-13 LAB — T4: T4, Total: 8.2 ug/dL (ref 4.5–12.0)

## 2020-03-13 LAB — T3, FREE: T3, Free: 2.7 pg/mL (ref 2.0–4.4)

## 2020-03-13 LAB — TSH: TSH: 1.44 u[IU]/mL (ref 0.450–4.500)

## 2020-03-13 LAB — VITAMIN D 25 HYDROXY (VIT D DEFICIENCY, FRACTURES): Vit D, 25-Hydroxy: 89.6 ng/mL (ref 30.0–100.0)

## 2020-03-20 ENCOUNTER — Ambulatory Visit: Payer: Medicare Other | Admitting: Physician Assistant

## 2020-03-20 DIAGNOSIS — D479 Neoplasm of uncertain behavior of lymphoid, hematopoietic and related tissue, unspecified: Secondary | ICD-10-CM | POA: Diagnosis not present

## 2020-03-20 DIAGNOSIS — I2510 Atherosclerotic heart disease of native coronary artery without angina pectoris: Secondary | ICD-10-CM | POA: Diagnosis not present

## 2020-03-20 DIAGNOSIS — E1122 Type 2 diabetes mellitus with diabetic chronic kidney disease: Secondary | ICD-10-CM | POA: Diagnosis not present

## 2020-03-20 DIAGNOSIS — N1831 Chronic kidney disease, stage 3a: Secondary | ICD-10-CM | POA: Diagnosis not present

## 2020-03-20 DIAGNOSIS — I129 Hypertensive chronic kidney disease with stage 1 through stage 4 chronic kidney disease, or unspecified chronic kidney disease: Secondary | ICD-10-CM | POA: Diagnosis not present

## 2020-03-21 ENCOUNTER — Other Ambulatory Visit: Payer: Self-pay

## 2020-03-21 MED ORDER — VITAMIN D3 1.25 MG (50000 UT) PO CAPS: 50000.0000 [IU] | ORAL_CAPSULE | ORAL | 1 refills | Status: DC

## 2020-03-24 NOTE — Progress Notes (Signed)
Cardiology Office Note:    Date:  03/25/2020   ID:  [name], DOB 07/19/1935, MRN [id number]  PCP:  [name], FNP  Cardiologist:  Sherren Mocha, MD  Electrophysiologist:  None  Nephrologist:  Dr. Hollie Salk  Referring MD: [name], FNP   Chief Complaint  Patient presents with   Follow-up    CAD    Patient Profile:    [name] is a 85 y.o. female with:   Coronary artery disease ? S/p NSTEMI 05/2018: DES to the proximal RCA ? Echo 05/2018: EF 60-65 with inferior hypokinesis  Diabetes mellitus  Hypertension  Hyperlipidemia  Lymphoproliferative disorder  Chronic kidney disease   Prior CV studies:  Echo 06/16/18 Mild focal basal septal hypertrophy, EF 60-65, inf-lat, inf and inf-sept HK, trivial MR, trivial TR  Cardiac Catheterization10/23/19 LM normal LAD irregs LCx ost 30 RCA prox 99 PCI: 2.5 x 18 mm [place] DES to RCA   History of Present Illness:    [name] was last seen in 08/2019.  She returns for follow up.  She is here alone.  She is feeling good. She has not had chest pain, shortness of breath, syncope, leg swelling.  She walks on a daily basis.     Past Medical History:  Diagnosis Date   Arthritis    Asthma    CAD (coronary artery disease)    s/p NSTEMI 10/19 - LHC:  pRCA 99, oLCx 30 >> DES to RCA; Echo 10/19: Mild focal basal septal hypertrophy, EF 60-65, inf-lat, inf and inf-sept HK, trivial MR, trivial TR   Diabetes mellitus without complication (HCC)    Hypertension     Current Medications: Current Meds  Medication Sig   ACCU-CHEK FASTCLIX LANCETS MISC USE AS DIRECTED FOR DAILY TESTING   ACCU-CHEK GUIDE test strip daily. use as directed   acetaminophen (TYLENOL) 325 MG tablet Take by mouth every 6 (six) hours as needed for mild pain.   amLODipine-olmesartan (AZOR) 5-20 MG per tablet Take 1 tablet by mouth every morning.    ANORO ELLIPTA 62.5-25 MCG/INH AEPB Inhale 1 puff by mouth once daily    Cholecalciferol (VITAMIN D3) 1.25 MG (50000 UT) CAPS Take 50,000 Units by mouth once a week. On Friday   clopidogrel (PLAVIX) 75 MG tablet Take 1 tablet by mouth once daily with breakfast   Dulaglutide (TRULICITY) 1.5 NT/7.0YF SOPN Inject 0.5 mLs (1.5 mg total) into the skin once a week.   insulin glargine (LANTUS) 100 unit/mL SOPN Inject 0.3 mLs (30 Units total) into the skin daily.   levothyroxine (SYNTHROID) 50 MCG tablet Take 1 tablet (50 mcg total) by mouth daily.   metoprolol succinate (TOPROL-XL) 25 MG 24 hr tablet Take 25 mg by mouth daily.   nitroGLYCERIN (NITROSTAT) 0.4 MG SL tablet Place 1 tablet (0.4 mg total) under the tongue every 5 (five) minutes x 3 doses as needed for chest pain.   rosuvastatin (CRESTOR) 20 MG tablet Take 20 mg by mouth daily.   XIIDRA 5 % SOLN Place 2 drops into both eyes 2 (two) times daily.      Allergies:   Patient has no known allergies.   Social History   Tobacco Use   Smoking status: Former Smoker    Packs/day: 0.50    Years: 60.00    Pack years: 30.00   Smokeless tobacco: Never Used  Scientific laboratory technician Use: Never used  Substance Use Topics   Alcohol use: No   Drug use: No  Family Hx: The patient's family history includes Diabetes in her brother, father, mother, and sister; Heart disease in her mother and sister.  ROS   EKGs/Labs/Other Test Reviewed:    EKG:  EKG is  ordered today.  The ekg ordered today demonstrates normal sinus rhythm, heart rate 76, normal axis, no ST-T wave changes, QTC 432, no change from prior tracing  Recent Labs: 09/01/2019: B Natriuretic Peptide 96.0 09/05/2019: Magnesium 1.8 12/11/2019: Hemoglobin 13.2; Platelets 230 03/12/2020: ALT 28; BUN 8; Creatinine, Ser 1.25; Potassium 4.7; Sodium 140; TSH 1.440   Recent Lipid Panel Lab Results  Component Value Date/Time   CHOL 137 12/11/2019 12:11 PM   TRIG 203 (H) 12/11/2019 12:11 PM   HDL 47 12/11/2019 12:11 PM   CHOLHDL 2.9 12/11/2019 12:11 PM    CHOLHDL 2.6 06/15/2018 05:42 AM   LDLCALC 57 12/11/2019 12:11 PM    Physical Exam:    VS:  BP 122/68    Pulse 76    Ht 5' 1.5" (1.562 m)    Wt 148 lb (67.1 kg)    SpO2 96%    BMI 27.51 kg/m     Wt Readings from Last 3 Encounters:  03/25/20 148 lb (67.1 kg)  03/12/20 147 lb (66.7 kg)  12/11/19 144 lb (65.3 kg)     Constitutional:      Appearance: Healthy appearance. Not in distress.  Neck:     Vascular: JVD normal.  Pulmonary:     Effort: Pulmonary effort is normal.     Breath sounds: No wheezing. No rales.  Cardiovascular:     Normal rate. Regular rhythm. Normal S1. Normal S2.     Murmurs: There is no murmur.  Edema:    Peripheral edema absent.  Abdominal:     Palpations: Abdomen is soft.  Skin:    General: Skin is warm and dry.  Neurological:     Mental Status: Alert and oriented to person, place and time.     Cranial Nerves: Cranial nerves are intact.      ASSESSMENT & PLAN:    1. Coronary artery disease involving native coronary artery of native heart without angina pectoris History of non-ST elevation myocardial infarction October 2019 treated with a drug-eluting stent to the RCA.  She is doing well without angina.  Recent LDL < 70.  BP is optimal.  Continue current dose of clopidogrel, metoprolol succinate, rosuvastatin.  Follow-up 1 year.  Dispo:  Return in about 1 year (around 03/25/2021) for Routine Follow Up, w/ Dr. Burt Knack, or Richardson Dopp, PA-C, in person.   Medication Adjustments/Labs and Tests Ordered: Current medicines are reviewed at length with the patient today.  Concerns regarding medicines are outlined above.  Tests Ordered: Orders Placed This Encounter  Procedures   EKG 12-Lead   Medication Changes: No orders of the defined types were placed in this encounter.   Signed, Richardson Dopp, PA-C  03/25/2020 1:09 PM    [facility] [address], [place], [place]  [address] Phone: [phone]; Fax: [phone]

## 2020-03-25 ENCOUNTER — Encounter: Payer: Self-pay | Admitting: Physician Assistant

## 2020-03-25 ENCOUNTER — Ambulatory Visit: Payer: Medicare Other | Admitting: Physician Assistant

## 2020-03-25 ENCOUNTER — Other Ambulatory Visit: Payer: Self-pay

## 2020-03-25 VITALS — BP 122/68 | HR 76 | Ht 61.5 in | Wt 148.0 lb

## 2020-03-25 DIAGNOSIS — I2510 Atherosclerotic heart disease of native coronary artery without angina pectoris: Secondary | ICD-10-CM | POA: Diagnosis not present

## 2020-03-25 NOTE — Patient Instructions (Signed)

## 2020-03-28 ENCOUNTER — Telehealth: Payer: Self-pay

## 2020-03-28 NOTE — Telephone Encounter (Signed)
Patient's daughter is here in the office and requested that we send a refill of vitamin d to the pharmacy I looked back at her labs with Dr.Sanders and she stated her levels are normal and she is ok to do vitamin d 5000 units daily OTC or if she wants she can do the prescription vitamin d every 2 weeks. She stated they will just do the vitamin d 5000 units daily she stated her mom drinks a lot of milk and goes on walks daily so she is probably getting enough vitamin d now. YL,RMA

## 2020-03-31 NOTE — Progress Notes (Signed)
[facility]   [phone] [phone]   Clinic Follow up Note   Patient Care Team: Arnette Felts, FNP as PCP - General (General Practice) Tonny Bollman, MD as PCP - Cardiology (Cardiology) Avel Peace, MD as Consulting Physician (General Surgery) Malachy Mood, MD as Consulting Physician (Hematology) 04/01/2020  CHIEF COMPLAINT: F/u lymphoproliferative disease   CURRENT THERAPY: Observation   INTERVAL HISTORY: Ms. Runnels returns for annual f/u as scheduled. She was last seen by Dr. Mosetta Putt on 04/03/19. Mammogram and [place] in 03/2020 showed stable right greater than left axillary adenopathy.  She feels "wonderful," denies changes in her health since last visit.  Denies unintentional weight loss, fever, night sweats.  Appetite and energy are normal, she walks every morning.  Denies new or enlarging adenopathy, change in bowel habits, bleeding, abdominal pain or bloating, fever/chills, cough, chest pain.  In January she had uncontrolled hyperglycemia with BG > 700 she was hospitalized for management and found to have COVID-19 infection, she recovered well and completed the vaccines in March.   MEDICAL HISTORY:  Past Medical History:  Diagnosis Date  . Arthritis   . Asthma   . CAD (coronary artery disease)    s/p NSTEMI 10/19 - LHC:  pRCA 99, oLCx 30 >> DES to RCA; Echo 10/19: Mild focal basal septal hypertrophy, EF 60-65, inf-lat, inf and inf-sept HK, trivial MR, trivial TR  . Diabetes mellitus without complication (HCC)   . Hypertension     SURGICAL HISTORY: Past Surgical History:  Procedure Laterality Date  . ABDOMINAL HYSTERECTOMY  "many yrs ago"  . AXILLARY LYMPH NODE BIOPSY Right 02/26/2015   Procedure: RIGHT AXILLARY LYMPH NODE BIOPSY;  Surgeon: Avel Peace, MD;  Location: [facility];  Service: General;  Laterality: Right;  . CHOLECYSTECTOMY    . CORONARY STENT INTERVENTION N/A 06/15/2018   Procedure: CORONARY STENT INTERVENTION;  Surgeon: Iran Ouch, MD;  Location: [facility] INVASIVE CV LAB;  Service: Cardiovascular;  Laterality: N/A;  . LEFT HEART CATH AND CORONARY ANGIOGRAPHY N/A 06/15/2018   Procedure: LEFT HEART CATH AND CORONARY ANGIOGRAPHY;  Surgeon: Iran Ouch, MD;  Location: [facility] INVASIVE CV LAB;  Service: Cardiovascular;  Laterality: N/A;    I have reviewed the social history and family history with the patient and they are unchanged from previous note.  ALLERGIES:  has No Known Allergies.  MEDICATIONS:  Current Outpatient Medications  Medication Sig Dispense Refill  . ACCU-CHEK FASTCLIX LANCETS MISC USE AS DIRECTED FOR DAILY TESTING    . ACCU-CHEK GUIDE test strip daily. use as directed    . acetaminophen (TYLENOL) 325 MG tablet Take by mouth every 6 (six) hours as needed for mild pain.    Marland Kitchen amLODipine-olmesartan (AZOR) 5-20 MG per tablet Take 1 tablet by mouth every morning.     Ailene Ards ELLIPTA 62.5-25 MCG/INH AEPB Inhale 1 puff by mouth once daily 60 each 0  . Cholecalciferol (VITAMIN D3) 1.25 MG (50000 UT) CAPS Take 50,000 Units by mouth once a week. On Friday 1 capsule 1  . clopidogrel (PLAVIX) 75 MG tablet Take 1 tablet by mouth once daily with breakfast 90 tablet 3  . Dulaglutide (TRULICITY) 1.5 MG/0.5ML SOPN Inject 0.5 mLs (1.5 mg total) into the skin once a week. 12 pen 1  . insulin glargine (LANTUS) 100 unit/mL SOPN Inject 0.3 mLs (30 Units total) into the skin daily. 15 mL 3  . levothyroxine (SYNTHROID) 50 MCG tablet Take 1 tablet (50 mcg total) by mouth daily. 90  tablet 0  . metoprolol succinate (TOPROL-XL) 25 MG 24 hr tablet Take 25 mg by mouth daily.    . nitroGLYCERIN (NITROSTAT) 0.4 MG SL tablet Place 1 tablet (0.4 mg total) under the tongue every 5 (five) minutes x 3 doses as needed for chest pain. 25 tablet 1  . rosuvastatin (CRESTOR) 20 MG tablet Take 20 mg by mouth daily.    Marland Kitchen XIIDRA 5 % SOLN Place 2 drops into both eyes 2 (two) times daily.      No current facility-administered medications for  this visit.    PHYSICAL EXAMINATION: ECOG PERFORMANCE STATUS: 0 - Asymptomatic  Vitals:   04/01/20 1454  BP: 131/68  Pulse: 82  Resp: 17  Temp: (!) 96.5 F (35.8 C)  SpO2: 99%   Filed Weights   04/01/20 1454  Weight: 149 lb 8 oz (67.8 kg)    GENERAL:alert, no distress and comfortable SKIN: No rash to exposed skin EYES: sclera clear NECK: Without mass LYMPH:  no palpable cervical, supraclavicular, axillary, or inguinal lymphadenopathy  LUNGS: clear with normal breathing effort HEART: regular rate & rhythm, no lower extremity edema ABDOMEN:abdomen soft, non-tender and normal bowel sounds.  No hepatomegaly or mass NEURO: alert & oriented x 3 with fluent speech  LABORATORY DATA:  I have reviewed the data as listed CBC Latest Ref Rng & Units 04/01/2020 12/11/2019 09/05/2019  WBC 4.0 - 10.5 K/uL 9.2 8.9 6.8  Hemoglobin 12.0 - 15.0 g/dL 12.8 13.2 11.4(L)  Hematocrit 36 - 46 % 38.3 41.3 36.4  Platelets 150 - 400 K/uL 246 230 207     CMP Latest Ref Rng & Units 04/01/2020 03/12/2020 12/11/2019  Glucose 70 - 99 mg/dL 233(H) 170(H) 121(H)  BUN 8 - 23 mg/dL _0 Creatinine 0.44 - 1.00 mg/dL 1.47(H) 1.25(H) 1.41(H)  Sodium 135 - 145 mmol/L 139 140 142  Potassium 3.5 - 5.1 mmol/L 4.4 4.7 4.7  Chloride 98 - 111 mmol/L 103 102 102  CO2 22 - 32 mmol/L _1 Calcium 8.9 - 10.3 mg/dL 10.5(H) 9.8 10.1  Total Protein 6.5 - 8.1 g/dL 7.7 7.4 7.5  Total Bilirubin 0.3 - 1.2 mg/dL 0.7 0.7 0.6  Alkaline Phos 38 - 126 U/L 73 80 77  AST 15 - 41 U/L _2 ALT 0 - 44 U/L 21 28 34(H)      RADIOGRAPHIC STUDIES: I have personally reviewed the radiological images as listed and agreed with the findings in the report. No results found.   ASSESSMENT & PLAN: JENAN ELLEGOOD is a 85 y.o. female with   1.Lymphoproliferative disease, rule out lymphoma or CLL  -she was found to have bilateral axillary adenopathy in 2016. The biopsy is suspicious for lymphoproliferative disease, especially low  grade indolent lymphoma, especially marginal zone lymphoma. However no evidence of B cell monoclonality by flow. The FISH studies were also negative for IGH/BCL2 fusion or rearrangements of BCL6, or IGH rearrangement. The differential also includes reactive changes, although no radiographic evidence of respiratory or other inflammation at the time, or CLL.  -She has mild leukocytosis but her peripheral flow cytometry was negative for monoclonal B-cell. No evidence of CLL. -CT CAP on 10/14/2015 showed bilateral mild to moderate axillary lymphadenopathy, otherwise negative for adenopathy in the chest, abdomen, or pelvis -Mammogram and bilateral axillary ultrasound on 05/15/2019 showed stable adenopathy, similar compared to 2017 in 2016 ultrasounds -Currently on observation  2.  COVID-19 infection -She developed uncontrolled hyperglycemia with BG >700 and  January 2021, she was hospitalized for management and found to have COVID-19 infection. -LDH was elevated during this illness but has returned to normal today  -She recovered well and received the [place] vaccines in March 2021  3. HTN, DM, CAD -Follow-up PCP [name], FNP and cardiology  Disposition: Ms. Davenport is clinically doing well.  No B symptoms.  Exam is negative for adenopathy.  LDH is normal, CBC normal except mild lymphocytosis which has been stable since 2019.  CMP shows BG 233, SCr 1.47 which is in her normal range, Ca 10.5.  Our lab recently changed the dye for calcium reagent which is likely contributing to her mild hypercalcemia today. She appears adequately hydrated and does not take calcium supplements.  She prefers to follow-up with her PCP.  I recommend for her to have CBC with differential, LDH, and CMP every 6-12 months with physical exam including lymph node palpation.  Given that her axillary adenopathy is stable over many years she likely does not need this checked annually with mammogram, consider adding axillary ultrasound  with mammo q2-3 years or if clinically indicated.  I will forward my note to her PCP.  Follow-up open.  All questions were answered. The patient knows to call the clinic with any problems, questions or concerns. No barriers to learning were detected.     Alla Feeling, NP 04/01/20

## 2020-04-01 ENCOUNTER — Inpatient Hospital Stay: Payer: Medicare Other | Admitting: Nurse Practitioner

## 2020-04-01 ENCOUNTER — Encounter: Payer: Self-pay | Admitting: Nurse Practitioner

## 2020-04-01 ENCOUNTER — Other Ambulatory Visit: Payer: Self-pay

## 2020-04-01 ENCOUNTER — Inpatient Hospital Stay: Payer: Medicare Other | Attending: Nurse Practitioner

## 2020-04-01 VITALS — BP 131/68 | HR 82 | Temp 96.5°F | Resp 17 | Ht 61.5 in | Wt 149.5 lb

## 2020-04-01 DIAGNOSIS — Z794 Long term (current) use of insulin: Secondary | ICD-10-CM | POA: Insufficient documentation

## 2020-04-01 DIAGNOSIS — D72829 Elevated white blood cell count, unspecified: Secondary | ICD-10-CM | POA: Diagnosis not present

## 2020-04-01 DIAGNOSIS — I2510 Atherosclerotic heart disease of native coronary artery without angina pectoris: Secondary | ICD-10-CM | POA: Diagnosis not present

## 2020-04-01 DIAGNOSIS — D479 Neoplasm of uncertain behavior of lymphoid, hematopoietic and related tissue, unspecified: Secondary | ICD-10-CM

## 2020-04-01 DIAGNOSIS — Z8616 Personal history of COVID-19: Secondary | ICD-10-CM | POA: Diagnosis not present

## 2020-04-01 DIAGNOSIS — E119 Type 2 diabetes mellitus without complications: Secondary | ICD-10-CM | POA: Insufficient documentation

## 2020-04-01 DIAGNOSIS — Z79899 Other long term (current) drug therapy: Secondary | ICD-10-CM | POA: Diagnosis not present

## 2020-04-01 DIAGNOSIS — I10 Essential (primary) hypertension: Secondary | ICD-10-CM | POA: Diagnosis not present

## 2020-04-01 LAB — LACTATE DEHYDROGENASE: LDH: 188 U/L (ref 98–192)

## 2020-04-01 LAB — COMPREHENSIVE METABOLIC PANEL
ALT: 21 U/L (ref 0–44)
AST: 15 U/L (ref 15–41)
Albumin: 3.6 g/dL (ref 3.5–5.0)
Alkaline Phosphatase: 73 U/L (ref 38–126)
Anion gap: 10 (ref 5–15)
BUN: 14 mg/dL (ref 8–23)
CO2: 26 mmol/L (ref 22–32)
Calcium: 10.5 mg/dL — ABNORMAL HIGH (ref 8.9–10.3)
Chloride: 103 mmol/L (ref 98–111)
Creatinine, Ser: 1.47 mg/dL — ABNORMAL HIGH (ref 0.44–1.00)
GFR calc Af Amer: 38 mL/min — ABNORMAL LOW (ref >60)
GFR calc non Af Amer: 33 mL/min — ABNORMAL LOW (ref >60)
Glucose, Bld: 233 mg/dL — ABNORMAL HIGH (ref 70–99)
Potassium: 4.4 mmol/L (ref 3.5–5.1)
Sodium: 139 mmol/L (ref 135–145)
Total Bilirubin: 0.7 mg/dL (ref 0.3–1.2)
Total Protein: 7.7 g/dL (ref 6.5–8.1)

## 2020-04-01 LAB — CBC WITH DIFFERENTIAL/PLATELET
Abs Immature Granulocytes: 0.02 10*3/uL (ref 0.00–0.07)
Basophils Absolute: 0.1 10*3/uL (ref 0.0–0.1)
Basophils Relative: 1 %
Eosinophils Absolute: 0.1 10*3/uL (ref 0.0–0.5)
Eosinophils Relative: 1 %
HCT: 38.3 % (ref 36.0–46.0)
Hemoglobin: 12.8 g/dL (ref 12.0–15.0)
Immature Granulocytes: 0 %
Lymphocytes Relative: 49 %
Lymphs Abs: 4.6 10*3/uL — ABNORMAL HIGH (ref 0.7–4.0)
MCH: 28.9 pg (ref 26.0–34.0)
MCHC: 33.4 g/dL (ref 30.0–36.0)
MCV: 86.5 fL (ref 80.0–100.0)
Monocytes Absolute: 0.7 10*3/uL (ref 0.1–1.0)
Monocytes Relative: 8 %
Neutro Abs: 3.7 10*3/uL (ref 1.7–7.7)
Neutrophils Relative %: 41 %
Platelets: 246 10*3/uL (ref 150–400)
RBC: 4.43 MIL/uL (ref 3.87–5.11)
RDW: 14.1 % (ref 11.5–15.5)
WBC: 9.2 10*3/uL (ref 4.0–10.5)
nRBC: 0 % (ref 0.0–0.2)

## 2020-04-04 ENCOUNTER — Telehealth: Payer: Self-pay | Admitting: Nurse Practitioner

## 2020-04-04 NOTE — Telephone Encounter (Signed)
No 8/9 los 

## 2020-04-11 DIAGNOSIS — H40013 Open angle with borderline findings, low risk, bilateral: Secondary | ICD-10-CM | POA: Diagnosis not present

## 2020-04-11 DIAGNOSIS — H04123 Dry eye syndrome of bilateral lacrimal glands: Secondary | ICD-10-CM | POA: Diagnosis not present

## 2020-04-15 ENCOUNTER — Ambulatory Visit: Payer: Medicare Other | Admitting: Podiatrist

## 2020-04-15 ENCOUNTER — Other Ambulatory Visit: Payer: Self-pay | Admitting: Nurse Practitioner

## 2020-04-15 ENCOUNTER — Other Ambulatory Visit: Payer: Self-pay

## 2020-04-15 ENCOUNTER — Encounter: Payer: Self-pay | Admitting: Podiatrist

## 2020-04-15 DIAGNOSIS — B351 Tinea unguium: Secondary | ICD-10-CM

## 2020-04-15 DIAGNOSIS — Z794 Long term (current) use of insulin: Secondary | ICD-10-CM

## 2020-04-15 DIAGNOSIS — E1142 Type 2 diabetes mellitus with diabetic polyneuropathy: Secondary | ICD-10-CM | POA: Diagnosis not present

## 2020-04-15 DIAGNOSIS — E1169 Type 2 diabetes mellitus with other specified complication: Secondary | ICD-10-CM

## 2020-04-15 DIAGNOSIS — Z1231 Encounter for screening mammogram for malignant neoplasm of breast: Secondary | ICD-10-CM

## 2020-04-15 DIAGNOSIS — E118 Type 2 diabetes mellitus with unspecified complications: Secondary | ICD-10-CM | POA: Diagnosis not present

## 2020-04-15 DIAGNOSIS — R208 Other disturbances of skin sensation: Secondary | ICD-10-CM | POA: Diagnosis not present

## 2020-04-15 NOTE — Progress Notes (Signed)
Dm with lops Nails Bunion Back in 3 mo.    Chief Complaint  Patient presents with  . Nail Problem    Onychomycosis  . Diabetes    Most recent HgbA1c on file = 8.8. Pt has a history of neuropathy. No history of ulcers.     HPI: Patient is 85 y.o. female who presents today for the concerns as listed above. She is diabetic and presents for toenails that need to be trimmed.   Patient Active Problem List   Diagnosis Date Noted  . Acquired hypothyroidism 03/12/2020  . DKA (diabetic ketoacidoses) ([place]) 08/31/2019  . COVID-19 virus infection 08/31/2019  . AKI (acute kidney injury) ([place] [employer]) 08/31/2019  . CAD (coronary artery disease) 09/27/2018  . Hypertension 06/16/2018  . Hyperlipidemia 06/16/2018  . Type 2 diabetes mellitus with complication, with long-term current use of insulin ([place]) 06/16/2018  . Hx of NSTEMI 10/19 Tx with DES to [facility] 06/14/2018  . Lymphoproliferative disease ([place]) 10/03/2015    Current Outpatient Medications on File Prior to Visit  Medication Sig Dispense Refill  . ACCU-CHEK FASTCLIX LANCETS MISC USE AS DIRECTED FOR DAILY TESTING    . ACCU-CHEK GUIDE test strip daily. use as directed    . acetaminophen (TYLENOL) 325 MG tablet Take by mouth every 6 (six) hours as needed for mild pain.    Marland Kitchen amLODipine-olmesartan (AZOR) 5-20 MG per tablet Take 1 tablet by mouth every morning.     Jearl Klinefelter ELLIPTA 62.5-25 MCG/INH AEPB Inhale 1 puff by mouth once daily 60 each 0  . Cholecalciferol (VITAMIN D3) 1.25 MG (50000 UT) CAPS Take 50,000 Units by mouth once a week. On Friday 1 capsule 1  . clopidogrel (PLAVIX) 75 MG tablet Take 1 tablet by mouth once daily with breakfast 90 tablet 3  . Dulaglutide (TRULICITY) 1.5 DJ/4.9FW SOPN Inject 0.5 mLs (1.5 mg total) into the skin once a week. 12 pen 1  . insulin glargine (LANTUS) 100 unit/mL SOPN Inject 0.3 mLs (30 Units total) into the skin daily. 15 mL 3  . levothyroxine (SYNTHROID) 50 MCG tablet Take 1 tablet (50 mcg total) by mouth  daily. 90 tablet 0  . metoprolol succinate (TOPROL-XL) 25 MG 24 hr tablet Take 25 mg by mouth daily.    . nitroGLYCERIN (NITROSTAT) 0.4 MG SL tablet Place 1 tablet (0.4 mg total) under the tongue every 5 (five) minutes x 3 doses as needed for chest pain. 25 tablet 1  . rosuvastatin (CRESTOR) 20 MG tablet Take 20 mg by mouth daily.    Marland Kitchen XIIDRA 5 % SOLN Place 2 drops into both eyes 2 (two) times daily.      No current facility-administered medications on file prior to visit.    No Known Allergies  Review of Systems No fevers, chills, nausea, muscle aches, no difficulty breathing, no calf pain, no chest pain or shortness of breath.   Physical Exam  GENERAL APPEARANCE: Alert, conversant. Appropriately groomed. No acute distress.   VASCULAR: Pedal pulses palpable DP and PT bilateral.  Capillary refill time is immediate to all digits,  Proximal to distal cooling it warm to warm.  Digital perfusion adequate.   NEUROLOGIC: sensation is decreased to 5.07 monofilament at 0/5 sites bilateral.  Light touch is decreased bilateral, vibratory sensation decreased bilateral-  With loss of protective sensation noted.   MUSCULOSKELETAL: acceptable muscle strength, tone and stability bilateral.  No gross boney pedal deformities noted.  No pain, crepitus or limitation noted with foot and ankle range of motion  bilateral.   DERMATOLOGIC: skin is warm, supple, and dry.  No open lesions noted.  No rash, no pre ulcerative lesions. Digital nails are thick, discolored, dystrophic, brittle with subungual debris present and clinically mycotic x 10.     Assessment     ICD-10-CM   1. Onychomycosis of multiple toenails with type 2 diabetes mellitus and peripheral neuropathy (HCC)  E11.42    B35.1    E11.69   2. Loss of protective sensation of skin of foot  R20.8   3. Type 2 diabetes mellitus with complication, with long-term current use of insulin (HCC)  E11.8    Z79.4      Plan  Debridement of toenails was  recommended.  Onychoreduction of symptomatic toenails was performed via nail nipper and power burr without iatrogenic incident.   Patient was instructed on signs and symptoms of infection and was told to call immediately should any of these arise.

## 2020-04-19 ENCOUNTER — Other Ambulatory Visit: Payer: Self-pay | Admitting: Nurse Practitioner

## 2020-04-21 IMAGING — CR DG CHEST 2V
2 series · 2 of 2 positions shown · non-contrast
Comparison: CT 10/14/2015.

CLINICAL DATA: Right upper chest pain.

EXAM:
CHEST - 2 VIEW

[w chest pa]
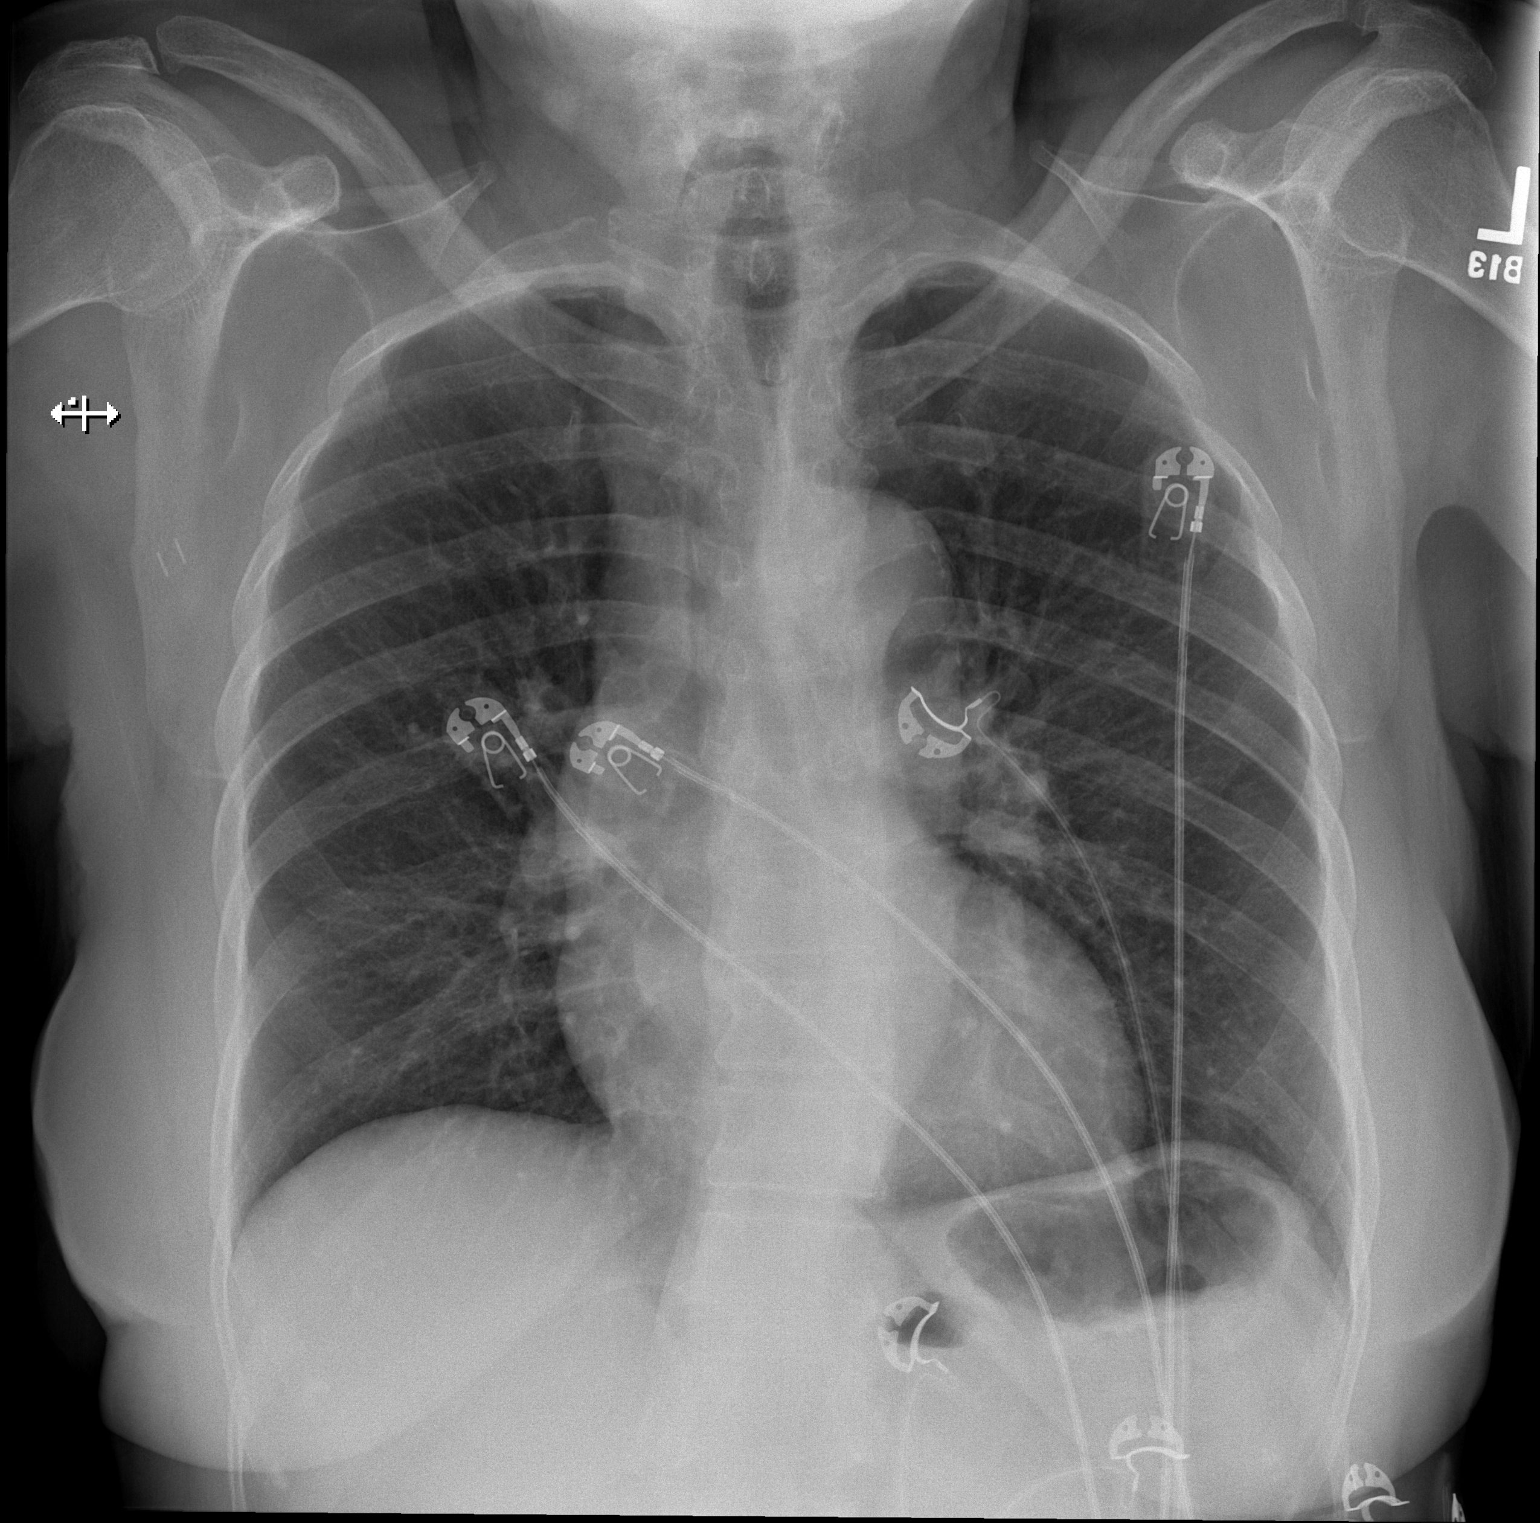

[w chest lat]
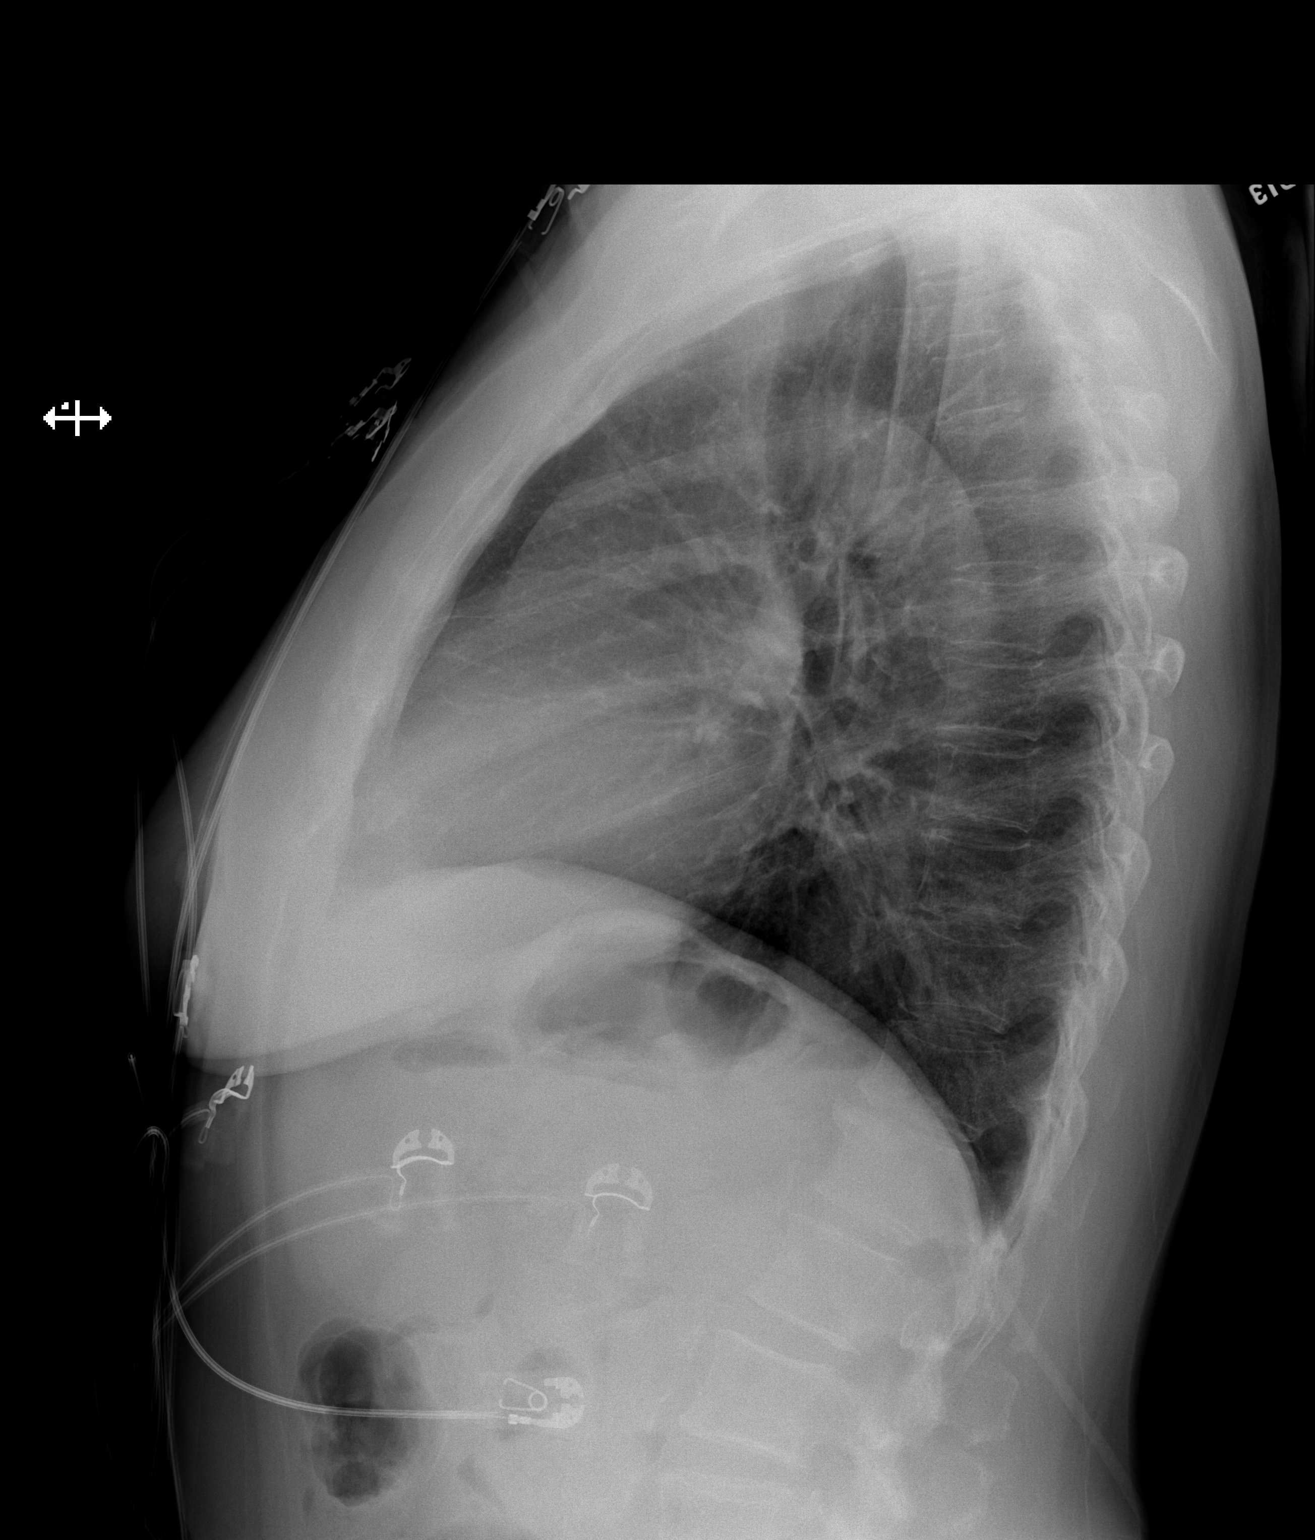

[2 of 2 positions shown; findings below may reference images not displayed]

FINDINGS: Stable mediastinal fullness consistent prominent great vessels and
tortuosity of the thoracic aorta again noted. No interim change.
Heart size stable. Lungs are clear. No pleural effusion or
pneumothorax. No acute bony abnormality. Thoracic spine scoliosis.
Surgical clips right axilla.
IMPRESSION: No acute cardiopulmonary disease.

## 2020-04-25 ENCOUNTER — Other Ambulatory Visit: Payer: Self-pay

## 2020-04-25 MED ORDER — VITAMIN D3 1.25 MG (50000 UT) PO CAPS: 50000.0000 [IU] | ORAL_CAPSULE | ORAL | 1 refills | Status: DC

## 2020-04-25 MED ORDER — RELION PEN NEEDLES 31G X 8 MM MISC: 2 refills | Status: DC

## 2020-05-03 ENCOUNTER — Other Ambulatory Visit: Payer: Self-pay

## 2020-05-03 MED ORDER — VITAMIN D3 1.25 MG (50000 UT) PO CAPS: 50000.0000 [IU] | ORAL_CAPSULE | ORAL | 0 refills | Status: DC

## 2020-05-03 MED ORDER — ACCU-CHEK FASTCLIX LANCETS MISC: 2 refills | Status: DC

## 2020-05-05 ENCOUNTER — Other Ambulatory Visit: Payer: Self-pay | Admitting: Nurse Practitioner

## 2020-05-08 ENCOUNTER — Other Ambulatory Visit: Payer: Self-pay

## 2020-05-08 MED ORDER — NITROGLYCERIN 0.4 MG SL SUBL: 0.4000 mg | SUBLINGUAL_TABLET | SUBLINGUAL | 7 refills | Status: AC | PRN

## 2020-05-15 ENCOUNTER — Other Ambulatory Visit: Payer: Self-pay

## 2020-05-15 ENCOUNTER — Ambulatory Visit
Admission: RE | Admit: 2020-05-15 | Discharge: 2020-05-15 | Disposition: A | Discharge status: Home / Self Care | Payer: Medicare Other | Source: Ambulatory Visit | Attending: Nurse Practitioner | Admitting: Nurse Practitioner

## 2020-05-15 DIAGNOSIS — Z1231 Encounter for screening mammogram for malignant neoplasm of breast: Secondary | ICD-10-CM

## 2020-05-17 ENCOUNTER — Other Ambulatory Visit: Payer: Self-pay | Admitting: Nurse Practitioner

## 2020-06-12 ENCOUNTER — Ambulatory Visit (INDEPENDENT_AMBULATORY_CARE_PROVIDER_SITE_OTHER): Payer: Medicare Other

## 2020-06-12 ENCOUNTER — Other Ambulatory Visit: Payer: Self-pay

## 2020-06-12 ENCOUNTER — Ambulatory Visit (INDEPENDENT_AMBULATORY_CARE_PROVIDER_SITE_OTHER): Payer: Medicare Other | Admitting: Nurse Practitioner

## 2020-06-12 ENCOUNTER — Encounter: Payer: Self-pay | Admitting: Nurse Practitioner

## 2020-06-12 VITALS — BP 118/70 | HR 77 | Temp 98.4°F | Ht 63.2 in | Wt 149.4 lb

## 2020-06-12 VITALS — BP 118/70 | HR 77 | Temp 98.4°F | Ht 63.2 in | Wt 149.5 lb

## 2020-06-12 DIAGNOSIS — E785 Hyperlipidemia, unspecified: Secondary | ICD-10-CM

## 2020-06-12 DIAGNOSIS — Z0000 Encounter for general adult medical examination without abnormal findings: Secondary | ICD-10-CM

## 2020-06-12 DIAGNOSIS — Z794 Long term (current) use of insulin: Secondary | ICD-10-CM | POA: Diagnosis not present

## 2020-06-12 DIAGNOSIS — E039 Hypothyroidism, unspecified: Secondary | ICD-10-CM | POA: Diagnosis not present

## 2020-06-12 DIAGNOSIS — Z1159 Encounter for screening for other viral diseases: Secondary | ICD-10-CM | POA: Diagnosis not present

## 2020-06-12 DIAGNOSIS — Z87891 Personal history of nicotine dependence: Secondary | ICD-10-CM

## 2020-06-12 DIAGNOSIS — E2839 Other primary ovarian failure: Secondary | ICD-10-CM

## 2020-06-12 DIAGNOSIS — E559 Vitamin D deficiency, unspecified: Secondary | ICD-10-CM

## 2020-06-12 DIAGNOSIS — E118 Type 2 diabetes mellitus with unspecified complications: Secondary | ICD-10-CM

## 2020-06-12 DIAGNOSIS — I10 Essential (primary) hypertension: Secondary | ICD-10-CM

## 2020-06-12 LAB — POCT URINALYSIS DIPSTICK
Bilirubin, UA: NEGATIVE
Glucose, UA: NEGATIVE
Ketones, UA: NEGATIVE
Nitrite, UA: NEGATIVE
Protein, UA: NEGATIVE
Spec Grav, UA: 1.02 (ref 1.010–1.025)
Urobilinogen, UA: 0.2 E.U./dL
pH, UA: 5.5 (ref 5.0–8.0)

## 2020-06-12 LAB — POCT UA - MICROALBUMIN
Albumin/Creatinine Ratio, Urine, POC: <30
Creatinine, POC: 100 mg/dL
Microalbumin Ur, POC: 10 mg/L

## 2020-06-12 NOTE — Progress Notes (Signed)
This visit occurred during the SARS-CoV-2 public health emergency.  Safety protocols were in place, including screening questions prior to the visit, additional usage of staff PPE, and extensive cleaning of exam room while observing appropriate contact time as indicated for disinfecting solutions.  Subjective:   [name] is a 85 y.o. female who presents for Medicare Annual (Subsequent) preventive examination.  Review of Systems     Cardiac Risk Factors include: advanced age (>62men, >25 women);diabetes mellitus;dyslipidemia;hypertension     Objective:    Today's Vitals   06/12/20 0953  BP: 118/70  Pulse: 77  Temp: 98.4 F (36.9 C)  TempSrc: Oral  SpO2: 98%  Weight: 149 lb 6.4 oz (67.8 kg)  Height: 5' 3.2" (1.605 m)   Body mass index is 26.3 kg/m.  Advanced Directives 06/12/2020 09/01/2019 08/31/2019 06/15/2018 06/15/2018 04/01/2017 04/02/2016  Does Patient Have a Medical Advance Directive? No No No No No Yes No  Would patient like information on creating a medical advance directive? No - Patient declined Yes (ED - Information included in AVS) Yes (ED - Information included in AVS) No - Patient declined - - -    Current Medications (verified) Outpatient Encounter Medications as of 06/12/2020  Medication Sig  . Accu-Chek FastClix Lancets MISC USE AS DIRECTED FOR DAILY TESTING  . ACCU-CHEK GUIDE test strip daily. use as directed  . acetaminophen (TYLENOL) 325 MG tablet Take by mouth every 6 (six) hours as needed for mild pain.  Marland Kitchen amLODipine-olmesartan (AZOR) 5-20 MG per tablet Take 1 tablet by mouth every morning.   Jearl Klinefelter ELLIPTA 62.5-25 MCG/INH AEPB Inhale 1 puff by mouth once daily  . Cholecalciferol (VITAMIN D3) 1.25 MG (50000 UT) CAPS Take 50,000 Units by mouth once a week. On Friday  . clopidogrel (PLAVIX) 75 MG tablet Take 1 tablet by mouth once daily with breakfast  . Dulaglutide (TRULICITY) 1.5 ML/4.6TK SOPN Inject 0.5 mLs (1.5 mg total) into the skin once a  week.  . insulin glargine (LANTUS) 100 unit/mL SOPN Inject 0.3 mLs (30 Units total) into the skin daily.  . Insulin Pen Needle (RELION PEN NEEDLE 31G/8MM) 31G X 8 MM MISC Use as directed  . levothyroxine (SYNTHROID) 50 MCG tablet Take 1 tablet by mouth once daily  . metoprolol succinate (TOPROL-XL) 25 MG 24 hr tablet Take 25 mg by mouth daily.  . nitroGLYCERIN (NITROSTAT) 0.4 MG SL tablet Place 1 tablet (0.4 mg total) under the tongue every 5 (five) minutes x 3 doses as needed for chest pain.  . rosuvastatin (CRESTOR) 20 MG tablet Take 20 mg by mouth daily.  Marland Kitchen XIIDRA 5 % SOLN Place 2 drops into both eyes 2 (two) times daily.    No facility-administered encounter medications on file as of 06/12/2020.    Allergies (verified) Patient has no known allergies.   History: Past Medical History:  Diagnosis Date  . Arthritis   . Asthma   . CAD (coronary artery disease)    s/p NSTEMI 10/19 - LHC:  pRCA 99, oLCx 30 >> DES to RCA; Echo 10/19: Mild focal basal septal hypertrophy, EF 60-65, inf-lat, inf and inf-sept HK, trivial MR, trivial TR  . Diabetes mellitus without complication ([place])   . Hypertension    Past Surgical History:  Procedure Laterality Date  . ABDOMINAL HYSTERECTOMY  "many yrs ago"  . AXILLARY LYMPH NODE BIOPSY Right 02/26/2015   Procedure: RIGHT AXILLARY LYMPH NODE BIOPSY;  Surgeon: Jackolyn Confer, MD;  Location: [facility];  Service: General;  Laterality:  Right;  Marland Kitchen CHOLECYSTECTOMY    . CORONARY STENT INTERVENTION N/A 06/15/2018   Procedure: CORONARY STENT INTERVENTION;  Surgeon: Wellington Hampshire, MD;  Location: [facility];  Service: Cardiovascular;  Laterality: N/A;  . LEFT HEART CATH AND CORONARY ANGIOGRAPHY N/A 06/15/2018   Procedure: LEFT HEART CATH AND CORONARY ANGIOGRAPHY;  Surgeon: Wellington Hampshire, MD;  Location: [facility];  Service: Cardiovascular;  Laterality: N/A;   Family History  Problem Relation Age of Onset  . Diabetes Mother   . Heart disease  Mother   . Diabetes Father   . Diabetes Sister   . Heart disease Sister   . Diabetes Brother    Social History   Socioeconomic History  . Marital status: Married    Spouse name: Not on file  . Number of children: Not on file  . Years of education: Not on file  . Highest education level: Not on file  Occupational History  . Occupation: retired  Tobacco Use  . Smoking status: Former Smoker    Packs/day: 0.50    Years: 60.00    Pack years: 30.00  . Smokeless tobacco: Never Used  Vaping Use  . Vaping Use: Never used  Substance and Sexual Activity  . Alcohol use: No  . Drug use: No  . Sexual activity: Not on file  Other Topics Concern  . Not on file  Social History Narrative  . Not on file   Social Determinants of Health   Financial Resource Strain: Low Risk   . Difficulty of Paying Living Expenses: Not hard at all  Food Insecurity: No Food Insecurity  . Worried About Charity fundraiser in the Last Year: Never true  . Ran Out of Food in the Last Year: Never true  Transportation Needs: No Transportation Needs  . Lack of Transportation (Medical): No  . Lack of Transportation (Non-Medical): No  Physical Activity: Sufficiently Active  . Days of Exercise per Week: 5 days  . Minutes of Exercise per Session: 60 min  Stress: No Stress Concern Present  . Feeling of Stress : Not at all  Social Connections:   . Frequency of Communication with Friends and Family: Not on file  . Frequency of Social Gatherings with Friends and Family: Not on file  . Attends Religious Services: Not on file  . Active Member of Clubs or Organizations: Not on file  . Attends Archivist Meetings: Not on file  . Marital Status: Not on file    Tobacco Counseling Counseling given: Not Answered   Clinical Intake:  Pre-visit preparation completed: Yes  Pain : No/denies pain     Nutritional Status: BMI 25 -29 Overweight Nutritional Risks: None Diabetes: Yes  How often do you need  to have someone help you when you read instructions, pamphlets, or other written materials from your doctor or pharmacy?: 1 - Never What is the last grade level you completed in school?: 11th grade  Diabetic? Yes Nutrition Risk Assessment:  Has the patient had any N/V/D within the last 2 months?  No  Does the patient have any non-healing wounds?  No  Has the patient had any unintentional weight loss or weight gain?  No   Diabetes:  Is the patient diabetic?  Yes  If diabetic, was a CBG obtained today?  No  Did the patient bring in their glucometer from home?  No  How often do you monitor your CBG's? daily.   Financial Strains and Diabetes Management:  Are you having any financial strains with the device, your supplies or your medication? No .  Does the patient want to be seen by Chronic Care Management for management of their diabetes?  No  Would the patient like to be referred to a Nutritionist or for Diabetic Management?  No   Diabetic Exams:  Diabetic Eye Exam: Completed 08/10/2019 Diabetic Foot Exam: Completed today   Interpreter Needed?: No  Information entered by :: NAllen LPN   Activities of Daily Living In your present state of health, do you have any difficulty performing the following activities: 06/12/2020 09/01/2019  Hearing? N N  Vision? N N  Difficulty concentrating or making decisions? Y N  Comment sometimes memory -  Walking or climbing stairs? N Y  Comment - Secondary to [employer] or bathing? N Y  Doing errands, shopping? N N  Preparing Food and eating ? N -  Using the Toilet? N -  In the past six months, have you accidently leaked urine? Y -  Do you have problems with loss of bowel control? N -  Managing your Medications? N -  Managing your Finances? N -  Housekeeping or managing your Housekeeping? N -  Some recent data might be hidden    Patient Care Team: [name], FNP as PCP - General ([place]) Sherren Mocha, MD as  PCP - Cardiology (Cardiology) Jackolyn Confer, MD as Consulting Physician (General Surgery) Truitt Merle, MD as Consulting Physician (Hematology)  Indicate any recent Medical Services you may have received from other than [facility] providers in the past year (date may be approximate).     Assessment:   This is a routine wellness examination for Ko Olina.  Hearing/Vision screen  Hearing Screening   125Hz  250Hz  500Hz  1000Hz  2000Hz  3000Hz  4000Hz  6000Hz  8000Hz   Right ear:           Left ear:           Vision Screening Comments: Regular eye exams, Dr. Venetia Maxon  Dietary issues and exercise activities discussed: Current Exercise Habits: Home exercise routine, Type of exercise: walking, Time (Minutes): 60, Frequency (Times/Week): 5, Weekly Exercise (Minutes/Week): 300  Goals    . Patient Stated     06/12/2020, no goals      Depression Screen PHQ 2/9 Scores 06/12/2020 12/11/2019 09/07/2018  PHQ - 2 Score 0 0 0    Fall Risk Fall Risk  06/12/2020 12/11/2019 08/30/2018  Falls in the past year? 1 1 0  Comment fell out of the shower, due to vertigo - -  Number falls in past yr: 1 1 -  Injury with Fall? 0 0 -  Risk for fall due to : Medication side effect;History of fall(s) - Other (Comment)  Risk for fall due to: Comment - - Dizzines, vertigo  Follow up Falls evaluation completed;Education provided;Falls prevention discussed - Falls evaluation completed    Any stairs in or around the home? Yes  If so, are there any without handrails? No  Home free of loose throw rugs in walkways, pet beds, electrical cords, etc? Yes  Adequate lighting in your home to reduce risk of falls? Yes   ASSISTIVE DEVICES UTILIZED TO PREVENT FALLS:  Life alert? No  Use of a cane, walker or w/c? No  Grab bars in the bathroom? No  Shower chair or bench in shower? No  Elevated toilet seat or a handicapped toilet? No   TIMED UP AND GO:  Was the test performed? No .   Gait  steady and fast without use of assistive  device  Cognitive Function:     6CIT Screen 06/12/2020  What Year? 0 points  What month? 0 points  What time? 0 points  Count back from 20 0 points  Months in reverse 0 points  Repeat phrase 2 points  Total Score 2    Immunizations Immunization History  Administered Date(s) Administered  . Influenza-Unspecified 06/24/2018, 04/22/2020  . PFIZER SARS-COV-2 Vaccination 10/17/2019, 11/07/2019, 06/03/2020    TDAP status: Up to date per patient Flu Vaccine status: Up to date Pneumococcal vaccine status: Up to date per patient Covid-19 vaccine status: Completed vaccines  Qualifies for Shingles Vaccine? Yes   Zostavax completed Yes   Shingrix Completed?: No.    Education has been provided regarding the importance of this vaccine. Patient has been advised to call insurance company to determine out of pocket expense if they have not yet received this vaccine. Advised may also receive vaccine at local pharmacy or Health Dept. Verbalized acceptance and understanding.  Screening Tests Health Maintenance  Topic Date Due  . FOOT EXAM  Never done  . TETANUS/TDAP  Never done  . DEXA SCAN  Never done  . PNA vac Low Risk Adult (1 of 2 - PCV13) 06/12/2021 (Originally 04/25/2002)  . OPHTHALMOLOGY EXAM  08/09/2020  . HEMOGLOBIN A1C  09/12/2020  . INFLUENZA VACCINE  Completed  . COVID-19 Vaccine  Completed    Health Maintenance  Health Maintenance Due  Topic Date Due  . FOOT EXAM  Never done  . TETANUS/TDAP  Never done  . DEXA SCAN  Never done    Colorectal cancer screening: No longer required.  Mammogram status: Completed 05/15/2020. Repeat every year Bone Density status: Ordered today. Pt provided with contact info and advised to call to schedule appt.  Lung Cancer Screening: (Low Dose CT Chest recommended if Age 56-80 years, 30 pack-year currently smoking OR have quit w/in 15years.) does not qualify.   Lung Cancer Screening Referral: no   Additional Screening:  Hepatitis C  Screening: does not qualify;  Vision Screening: Recommended annual ophthalmology exams for early detection of glaucoma and other disorders of the eye. Is the patient up to date with their annual eye exam?  Yes  Who is the provider or what is the name of the office in which the patient attends annual eye exams? Dr. Venetia Maxon If pt is not established with a provider, would they like to be referred to a provider to establish care? No .   Dental Screening: Recommended annual dental exams for proper oral hygiene  Community Resource Referral / Chronic Care Management: CRR required this visit?  No   CCM required this visit?  No      Plan:     I have personally reviewed and noted the following in the patient's chart:   . Medical and social history . Use of alcohol, tobacco or illicit drugs  . Current medications and supplements . Functional ability and status . Nutritional status . Physical activity . Advanced directives . List of other physicians . Hospitalizations, surgeries, and ER visits in previous 12 months . Vitals . Screenings to include cognitive, depression, and falls . Referrals and appointments  In addition, I have reviewed and discussed with patient certain preventive protocols, quality metrics, and best practice recommendations. A written personalized care plan for preventive services as well as general preventive health recommendations were provided to patient.     Kellie Simmering, LPN   [id number]  Nurse Notes:

## 2020-06-12 NOTE — Patient Instructions (Signed)
Health Maintenance, Female Adopting a healthy lifestyle and getting preventive care are important in promoting health and wellness. Ask your health care provider about:  The right schedule for you to have regular tests and exams.  Things you can do on your own to prevent diseases and keep yourself healthy. What should I know about diet, weight, and exercise? Eat a healthy diet   Eat a diet that includes plenty of vegetables, fruits, low-fat dairy products, and lean protein.  Do not eat a lot of foods that are high in solid fats, added sugars, or sodium. Maintain a healthy weight Body mass index (BMI) is used to identify weight problems. It estimates body fat based on height and weight. Your health care provider can help determine your BMI and help you achieve or maintain a healthy weight. Get regular exercise Get regular exercise. This is one of the most important things you can do for your health. Most adults should:  Exercise for at least 150 minutes each week. The exercise should increase your heart rate and make you sweat (moderate-intensity exercise).  Do strengthening exercises at least twice a week. This is in addition to the moderate-intensity exercise.  Spend less time sitting. Even light physical activity can be beneficial. Watch cholesterol and blood lipids Have your blood tested for lipids and cholesterol at 83 years of age, then have this test every 5 years. Have your cholesterol levels checked more often if:  Your lipid or cholesterol levels are high.  You are older than 83 years of age.  You are at high risk for heart disease. What should I know about cancer screening? Depending on your health history and family history, you may need to have cancer screening at various ages. This may include screening for:  Breast cancer.  Cervical cancer.  Colorectal cancer.  Skin cancer.  Lung cancer. What should I know about heart disease, diabetes, and high blood  pressure? Blood pressure and heart disease  High blood pressure causes heart disease and increases the risk of stroke. This is more likely to develop in people who have high blood pressure readings, are of African descent, or are overweight.  Have your blood pressure checked: ? Every 3-5 years if you are 18-39 years of age. ? Every year if you are 40 years old or older. Diabetes Have regular diabetes screenings. This checks your fasting blood sugar level. Have the screening done:  Once every three years after age 40 if you are at a normal weight and have a low risk for diabetes.  More often and at a younger age if you are overweight or have a high risk for diabetes. What should I know about preventing infection? Hepatitis B If you have a higher risk for hepatitis B, you should be screened for this virus. Talk with your health care provider to find out if you are at risk for hepatitis B infection. Hepatitis C Testing is recommended for:  Everyone born from 1945 through 1965.  Anyone with known risk factors for hepatitis C. Sexually transmitted infections (STIs)  Get screened for STIs, including gonorrhea and chlamydia, if: ? You are sexually active and are younger than 83 years of age. ? You are older than 83 years of age and your health care provider tells you that you are at risk for this type of infection. ? Your sexual activity has changed since you were last screened, and you are at increased risk for chlamydia or gonorrhea. Ask your health care provider if   you are at risk.  Ask your health care provider about whether you are at high risk for HIV. Your health care provider may recommend a prescription medicine to help prevent HIV infection. If you choose to take medicine to prevent HIV, you should first get tested for HIV. You should then be tested every 3 months for as long as you are taking the medicine. Pregnancy  If you are about to stop having your period (premenopausal) and  you may become pregnant, seek counseling before you get pregnant.  Take 400 to 800 micrograms (mcg) of folic acid every day if you become pregnant.  Ask for birth control (contraception) if you want to prevent pregnancy. Osteoporosis and menopause Osteoporosis is a disease in which the bones lose minerals and strength with aging. This can result in bone fractures. If you are 65 years old or older, or if you are at risk for osteoporosis and fractures, ask your health care provider if you should:  Be screened for bone loss.  Take a calcium or vitamin D supplement to lower your risk of fractures.  Be given hormone replacement therapy (HRT) to treat symptoms of menopause. Follow these instructions at home: Lifestyle  Do not use any products that contain nicotine or tobacco, such as cigarettes, e-cigarettes, and chewing tobacco. If you need help quitting, ask your health care provider.  Do not use street drugs.  Do not share needles.  Ask your health care provider for help if you need support or information about quitting drugs. Alcohol use  Do not drink alcohol if: ? Your health care provider tells you not to drink. ? You are pregnant, may be pregnant, or are planning to become pregnant.  If you drink alcohol: ? Limit how much you use to 0-1 drink a day. ? Limit intake if you are breastfeeding.  Be aware of how much alcohol is in your drink. In the [place] one drink equals one 12 oz bottle of beer (355 mL), one 5 oz glass of wine (148 mL), or one 1 oz glass of hard liquor (44 mL). General instructions  Schedule regular health, dental, and eye exams.  Stay current with your vaccines.  Tell your health care provider if: ? You often feel depressed. ? You have ever been abused or do not feel safe at home. Summary  Adopting a healthy lifestyle and getting preventive care are important in promoting health and wellness.  Follow your health care provider's instructions about healthy  diet, exercising, and getting tested or screened for diseases.  Follow your health care provider's instructions on monitoring your cholesterol and blood pressure. This information is not intended to replace advice given to you by your health care provider. Make sure you discuss any questions you have with your health care provider. Document Revised: 08/03/2018 Document Reviewed: 08/03/2018 Elsevier Patient Education  2020 [employer]  

## 2020-06-12 NOTE — Patient Instructions (Signed)
[name] , Thank you for taking time to come for your Medicare Wellness Visit. I appreciate your ongoing commitment to your health goals. Please review the following plan we discussed and let me know if I can assist you in the future.   Screening recommendations/referrals: Colonoscopy: not required Mammogram: 05/15/2020 Bone Density: ordered today Recommended yearly ophthalmology/optometry visit for glaucoma screening and checkup Recommended yearly dental visit for hygiene and checkup  Vaccinations: Influenza vaccine: completed 04/22/2020 Pneumococcal vaccine: decline Tdap vaccine: decline Shingles vaccine: discussed   Covid-19: 11/07/2019, 10/17/2019, 06/03/2020  Advanced directives: Advance directive discussed with you today. Even though you declined this today please call our office should you change your mind and we can give you the proper paperwork for you to fill out.  Conditions/risks identified: none  Next appointment: Follow up in one year for your annual wellness visit 06/18/2021 at 10:00   Preventive Care 65 Years and Older, Female Preventive care refers to lifestyle choices and visits with your health care provider that can promote health and wellness. What does preventive care include?  A yearly physical exam. This is also called an annual well check.  Dental exams once or twice a year.  Routine eye exams. Ask your health care provider how often you should have your eyes checked.  Personal lifestyle choices, including:  Daily care of your teeth and gums.  Regular physical activity.  Eating a healthy diet.  Avoiding tobacco and drug use.  Limiting alcohol use.  Practicing safe sex.  Taking low-dose aspirin every day.  Taking vitamin and mineral supplements as recommended by your health care provider. What happens during an annual well check? The services and screenings done by your health care provider during your annual well check will depend on your age,  overall health, lifestyle risk factors, and family history of disease. Counseling  Your health care provider may ask you questions about your:  Alcohol use.  Tobacco use.  Drug use.  Emotional well-being.  Home and relationship well-being.  Sexual activity.  Eating habits.  History of falls.  Memory and ability to understand (cognition).  Work and work Statistician.  Reproductive health. Screening  You may have the following tests or measurements:  Height, weight, and BMI.  Blood pressure.  Lipid and cholesterol levels. These may be checked every 5 years, or more frequently if you are over 65 years old.  Skin check.  Lung cancer screening. You may have this screening every year starting at age 71 if you have a 30-pack-year history of smoking and currently smoke or have quit within the past 15 years.  Fecal occult blood test (FOBT) of the stool. You may have this test every year starting at age 16.  Flexible sigmoidoscopy or colonoscopy. You may have a sigmoidoscopy every 5 years or a colonoscopy every 10 years starting at age 67.  Hepatitis C blood test.  Hepatitis B blood test.  Sexually transmitted disease (STD) testing.  Diabetes screening. This is done by checking your blood sugar (glucose) after you have not eaten for a while (fasting). You may have this done every 1-3 years.  Bone density scan. This is done to screen for osteoporosis. You may have this done starting at age 43.  Mammogram. This may be done every 1-2 years. Talk to your health care provider about how often you should have regular mammograms. Talk with your health care provider about your test results, treatment options, and if necessary, the need for more tests. Vaccines  Your  health care provider may recommend certain vaccines, such as:  Influenza vaccine. This is recommended every year.  Tetanus, diphtheria, and acellular pertussis (Tdap, Td) vaccine. You may need a Td booster every 10  years.  Zoster vaccine. You may need this after age 59.  Pneumococcal 13-valent conjugate (PCV13) vaccine. One dose is recommended after age 53.  Pneumococcal polysaccharide (PPSV23) vaccine. One dose is recommended after age 72. Talk to your health care provider about which screenings and vaccines you need and how often you need them. This information is not intended to replace advice given to you by your health care provider. Make sure you discuss any questions you have with your health care provider. Document Released: 09/06/2015 Document Revised: 04/29/2016 Document Reviewed: 06/11/2015 Elsevier Interactive Patient Education  2017 [place] Prevention in the Home Falls can cause injuries. They can happen to people of all ages. There are many things you can do to make your home safe and to help prevent falls. What can I do on the outside of my home?  Regularly fix the edges of walkways and driveways and fix any cracks.  Remove anything that might make you trip as you walk through a door, such as a raised step or threshold.  Trim any bushes or trees on the path to your home.  Use bright outdoor lighting.  Clear any walking paths of anything that might make someone trip, such as rocks or tools.  Regularly check to see if handrails are loose or broken. Make sure that both sides of any steps have handrails.  Any raised decks and porches should have guardrails on the edges.  Have any leaves, snow, or ice cleared regularly.  Use sand or salt on walking paths during winter.  Clean up any spills in your garage right away. This includes oil or grease spills. What can I do in the bathroom?  Use night lights.  Install grab bars by the toilet and in the tub and shower. Do not use towel bars as grab bars.  Use non-skid mats or decals in the tub or shower.  If you need to sit down in the shower, use a plastic, non-slip stool.  Keep the floor dry. Clean up any water that  spills on the floor as soon as it happens.  Remove soap buildup in the tub or shower regularly.  Attach bath mats securely with double-sided non-slip rug tape.  Do not have throw rugs and other things on the floor that can make you trip. What can I do in the bedroom?  Use night lights.  Make sure that you have a light by your bed that is easy to reach.  Do not use any sheets or blankets that are too big for your bed. They should not hang down onto the floor.  Have a firm chair that has side arms. You can use this for support while you get dressed.  Do not have throw rugs and other things on the floor that can make you trip. What can I do in the kitchen?  Clean up any spills right away.  Avoid walking on wet floors.  Keep items that you use a lot in easy-to-reach places.  If you need to reach something above you, use a strong step stool that has a grab bar.  Keep electrical cords out of the way.  Do not use floor polish or wax that makes floors slippery. If you must use wax, use non-skid floor wax.  Do not  have throw rugs and other things on the floor that can make you trip. What can I do with my stairs?  Do not leave any items on the stairs.  Make sure that there are handrails on both sides of the stairs and use them. Fix handrails that are broken or loose. Make sure that handrails are as long as the stairways.  Check any carpeting to make sure that it is firmly attached to the stairs. Fix any carpet that is loose or worn.  Avoid having throw rugs at the top or bottom of the stairs. If you do have throw rugs, attach them to the floor with carpet tape.  Make sure that you have a light switch at the top of the stairs and the bottom of the stairs. If you do not have them, ask someone to add them for you. What else can I do to help prevent falls?  Wear shoes that:  Do not have high heels.  Have rubber bottoms.  Are comfortable and fit you well.  Are closed at the  toe. Do not wear sandals.  If you use a stepladder:  Make sure that it is fully opened. Do not climb a closed stepladder.  Make sure that both sides of the stepladder are locked into place.  Ask someone to hold it for you, if possible.  Clearly mark and make sure that you can see:  Any grab bars or handrails.  First and last steps.  Where the edge of each step is.  Use tools that help you move around (mobility aids) if they are needed. These include:  Canes.  Walkers.  Scooters.  Crutches.  Turn on the lights when you go into a dark area. Replace any light bulbs as soon as they burn out.  Set up your furniture so you have a clear path. Avoid moving your furniture around.  If any of your floors are uneven, fix them.  If there are any pets around you, be aware of where they are.  Review your medicines with your doctor. Some medicines can make you feel dizzy. This can increase your chance of falling. Ask your doctor what other things that you can do to help prevent falls. This information is not intended to replace advice given to you by your health care provider. Make sure you discuss any questions you have with your health care provider. Document Released: 06/06/2009 Document Revised: 01/16/2016 Document Reviewed: 09/14/2014 Elsevier Interactive Patient Education  2017 [employer].

## 2020-06-12 NOTE — Progress Notes (Signed)
I,Yamilka Roman [employer] as a Education administrator for [employer], FNP.,have documented all relevant documentation on the behalf of [name], FNP,as directed by  [name], FNP while in the presence of [name], [place]. This visit occurred during the SARS-CoV-2 public health emergency.  Safety protocols were in place, including screening questions prior to the visit, additional usage of staff PPE, and extensive cleaning of exam room while observing appropriate contact time as indicated for disinfecting solutions.  Subjective:     Patient ID: [name] , female    DOB: 1935/07/11 , 85 y.o.   MRN: [id number]   Chief Complaint  Patient presents with  . Annual Exam    HPI  She is scheduled to see Dr. Burt Knack and Dr. Kathlen Mody on Aug 2nd, on Aug 9th goes to cancer center for lymphoproliferative disease this is her last year for follow up she believes.  She is scheduled to see the ophthalmologist next month as well  She had surgery to her right breast 5 years ago questionable breast cancer.   Hypertension This is a chronic problem. The current episode started more than 1 year ago. The problem has been gradually improving since onset. The problem is controlled. Pertinent negatives include no anxiety or chest pain. There are no associated agents to hypertension. Past treatments include angiotensin blockers, calcium channel blockers and beta blockers. There are no compliance problems.  Hypertensive end-organ damage includes kidney disease and CAD/MI. There is no history of angina. Identifiable causes of hypertension include chronic renal disease and a thyroid problem.  Diabetes She presents for her follow-up diabetic visit. She has type 2 diabetes mellitus. There are no hypoglycemic associated symptoms. Pertinent negatives for diabetes include no chest pain. There are no hypoglycemic complications. There are no diabetic complications. Risk factors for coronary artery disease include diabetes  mellitus. Current diabetic treatment includes oral agent (dual therapy). She is compliant with treatment all of the time. When asked about meal planning, she reported none. She participates in exercise daily. There is no change in her home blood glucose trend. (Blood sugar averages 122 occasionally will be higher depending on what she eats) She sees a podiatrist (Dr. Valentina Lucks - seen in August 2021).Eye exam is current (done December 2020).     Past Medical History:  Diagnosis Date  . Arthritis   . Asthma   . CAD (coronary artery disease)    s/p NSTEMI 10/19 - LHC:  pRCA 99, oLCx 30 >> DES to RCA; Echo 10/19: Mild focal basal septal hypertrophy, EF 60-65, inf-lat, inf and inf-sept HK, trivial MR, trivial TR  . Diabetes mellitus without complication ([place])   . Hypertension      Family History  Problem Relation Age of Onset  . Diabetes Mother   . Heart disease Mother   . Diabetes Father   . Diabetes Sister   . Heart disease Sister   . Diabetes Brother      Current Outpatient Medications:  .  Accu-Chek FastClix Lancets MISC, USE AS DIRECTED FOR DAILY TESTING, Disp: 102 each, Rfl: 2 .  ACCU-CHEK GUIDE test strip, daily. use as directed, Disp: , Rfl:  .  acetaminophen (TYLENOL) 325 MG tablet, Take by mouth every 6 (six) hours as needed for mild pain., Disp: , Rfl:  .  amLODipine-olmesartan (AZOR) 5-20 MG tablet, Take 1 tablet by mouth every morning., Disp: 90 tablet, Rfl: 0 .  ANORO ELLIPTA 62.5-25 MCG/INH AEPB, Inhale 1 puff by mouth once daily, Disp: 60 each, Rfl: 0 .  Cholecalciferol (VITAMIN D3) 1.25 MG (50000 UT) CAPS, TAKE 1 CAPSULE BY MOUTH ONCE A WEEK ON  FRIDAY, Disp: 12 capsule, Rfl: 0 .  clopidogrel (PLAVIX) 75 MG tablet, Take 1 tablet by mouth once daily with breakfast, Disp: 90 tablet, Rfl: 3 .  Dulaglutide (TRULICITY) 3 ZY/2.4MG SOPN, Inject 3 mg as directed once a week., Disp: 6 mL, Rfl: 1 .  insulin glargine (LANTUS) 100 unit/mL SOPN, Inject 0.3 mLs (30 Units total) into the  skin daily., Disp: 15 mL, Rfl: 3 .  Insulin Pen Needle (RELION PEN NEEDLE 31G/8MM) 31G X 8 MM MISC, Use as directed, Disp: 100 each, Rfl: 2 .  levothyroxine (SYNTHROID) 50 MCG tablet, Take 1 tablet by mouth once daily, Disp: 90 tablet, Rfl: 0 .  metoprolol succinate (TOPROL-XL) 25 MG 24 hr tablet, Take 1 tablet by mouth once daily, Disp: 90 tablet, Rfl: 2 .  nitroGLYCERIN (NITROSTAT) 0.4 MG SL tablet, Place 1 tablet (0.4 mg total) under the tongue every 5 (five) minutes x 3 doses as needed for chest pain., Disp: 25 tablet, Rfl: 7 .  rosuvastatin (CRESTOR) 20 MG tablet, Take 1 tablet by mouth once daily, Disp: 90 tablet, Rfl: 0 .  Vitamin D, Ergocalciferol, (DRISDOL) 1.25 MG (50000 UNIT) CAPS capsule, Take 50,000 Units by mouth once a week., Disp: , Rfl:  .  XIIDRA 5 % SOLN, Place 2 drops into both eyes 2 (two) times daily. , Disp: , Rfl:    No Known Allergies    The patient states she uses status post hysterectomy for birth control.  Negative for: breast discharge, breast lump(s), breast pain and breast self exam. Associated symptoms include abnormal vaginal bleeding. Pertinent negatives include abnormal bleeding (hematology), anxiety, decreased libido, depression, difficulty falling sleep, dyspareunia, history of infertility, nocturia, sexual dysfunction, sleep disturbances, urinary incontinence, urinary urgency, vaginal discharge and vaginal itching. Diet regular. She tries to avoid sweets.  The patient states her exercise level is moderate - she walks 5 days a week for one hour.      The patient's tobacco use is:  Social History   Tobacco Use  Smoking Status Former Smoker  . Packs/day: 0.50  . Years: 60.00  . Pack years: 30.00  Smokeless Tobacco Never Used   She has been exposed to passive smoke. The patient's alcohol use is:  Social History   Substance and Sexual Activity  Alcohol Use No      Review of Systems  Constitutional: Negative.   HENT: Negative.   Eyes: Negative.    Respiratory: Negative.   Cardiovascular: Negative.  Negative for chest pain.  Gastrointestinal: Negative.   Endocrine: Negative.   Genitourinary: Negative.   Musculoskeletal: Negative.   Skin: Negative.   Allergic/Immunologic: Negative.   Neurological: Negative.   Hematological: Negative.   Psychiatric/Behavioral: Negative.      Today's Vitals   06/12/20 1015  BP: 118/70  Pulse: 77  Temp: 98.4 F (36.9 C)  TempSrc: Oral  Weight: 149 lb 7.6 oz (67.8 kg)  Height: 5' 3.2" (1.605 m)   Body mass index is 26.31 kg/m.   Objective:  Physical Exam Constitutional:      General: She is not in acute distress.    Appearance: Normal appearance. She is well-developed. She is obese.  HENT:     Head: Normocephalic and atraumatic.     Right Ear: Hearing, tympanic membrane, ear canal and external ear normal. There is no impacted cerumen.     Left Ear: Hearing, tympanic membrane, ear canal  and external ear normal. There is no impacted cerumen.     Nose:     Comments: Deferred - masked    Mouth/Throat:     Comments: Deferred - masked Eyes:     General: Lids are normal.     Extraocular Movements: Extraocular movements intact.     Conjunctiva/sclera: Conjunctivae normal.     Pupils: Pupils are equal, round, and reactive to light.     Funduscopic exam:    Right eye: No papilledema.        Left eye: No papilledema.  Neck:     Thyroid: No thyroid mass.     Vascular: No carotid bruit.  Cardiovascular:     Rate and Rhythm: Normal rate and regular rhythm.     Pulses: Normal pulses.     Heart sounds: Normal heart sounds. No murmur heard.   Pulmonary:     Effort: Pulmonary effort is normal.     Breath sounds: Normal breath sounds.  Chest:     Chest wall: No mass.     Breasts: Tanner Score is 5.        Right: Normal. No mass or tenderness.        Left: Normal. No mass or tenderness.  Abdominal:     General: Abdomen is flat. Bowel sounds are normal. There is no distension.      Palpations: Abdomen is soft.     Tenderness: There is no abdominal tenderness.  Genitourinary:    Rectum: Guaiac result negative.  Musculoskeletal:        General: No swelling. Normal range of motion.     Cervical back: Full passive range of motion without pain, normal range of motion and neck supple.     Right lower leg: No edema.     Left lower leg: No edema.  Lymphadenopathy:     Upper Body:     Right upper body: No supraclavicular, axillary or pectoral adenopathy.     Left upper body: No supraclavicular, axillary or pectoral adenopathy.  Skin:    General: Skin is warm and dry.     Capillary Refill: Capillary refill takes less than 2 seconds.  Neurological:     General: No focal deficit present.     Mental Status: She is alert and oriented to person, place, and time.     Cranial Nerves: No cranial nerve deficit.     Sensory: No sensory deficit.  Psychiatric:        Mood and Affect: Mood normal.        Behavior: Behavior normal.        Thought Content: Thought content normal.        Judgment: Judgment normal.         Assessment And Plan:     1. Encounter for general adult medical examination w/o abnormal findings . Behavior modifications discussed and diet history reviewed.   . Pt will continue to exercise regularly and modify diet with low GI, plant based foods and decrease intake of processed foods.  . Recommend intake of daily multivitamin, Vitamin D, and calcium.  . Recommend mammogram and colonoscopy (aged out) for preventive screenings, as well as recommend immunizations that include influenza, TDAP . She also had her AWV done by [facility]  2. Encounter for hepatitis C screening test for low risk patient  Will check Hepatitis C screening due to recent recommendations to screen all adults 18 years and older - Hepatitis C antibody  3. Acquired hypothyroidism  Chronic, controlled  Continue with current medications - T4 - T3, free - TSH  4. Type 2 diabetes mellitus  with complication, with long-term current use of insulin (HCC)  Chronic, controlled  Continue with current medications  Encouraged to limit intake of sugary foods and drinks  Encouraged to increase physical activity to 150 minutes per week  Diabetic foot exam done, no abnormal findings  Eye exam is up to date - CMP14+EGFR - Hemoglobin A1c  5. Hyperlipidemia, unspecified hyperlipidemia type  Chronic, controlled  Continue with current medications - Lipid panel  6. Essential hypertension  Chronic, blood pressure is well controlled  7. Vitamin D deficiency  Will check vitamin D level and supplement as needed.     Also encouraged to spend 15 minutes in the sun daily.   8. History of smoking - CT CHEST LUNG CA SCREEN LOW DOSE W/O CM; Future     Patient was given opportunity to ask questions. Patient verbalized understanding of the plan and was able to repeat key elements of the plan. All questions were answered to their satisfaction.   [name], FNP   I, [name], FNP, have reviewed all documentation for this visit. The documentation on 08/22/20 for the exam, diagnosis, procedures, and orders are all accurate and complete.  THE PATIENT IS ENCOURAGED TO PRACTICE SOCIAL DISTANCING DUE TO THE COVID-19 PANDEMIC.

## 2020-06-13 ENCOUNTER — Telehealth: Payer: Self-pay | Admitting: Podiatrist

## 2020-06-13 LAB — CMP14+EGFR
ALT: 49 IU/L — ABNORMAL HIGH (ref 0–32)
AST: 35 IU/L (ref 0–40)
Albumin/Globulin Ratio: 1.3 (ref 1.2–2.2)
Albumin: 4.1 g/dL (ref 3.6–4.6)
Alkaline Phosphatase: 87 IU/L (ref 44–121)
BUN/Creatinine Ratio: 10 — ABNORMAL LOW (ref 12–28)
BUN: 12 mg/dL (ref 8–27)
Bilirubin Total: 0.7 mg/dL (ref 0.0–1.2)
CO2: 25 mmol/L (ref 20–29)
Calcium: 10.1 mg/dL (ref 8.7–10.3)
Chloride: 102 mmol/L (ref 96–106)
Creatinine, Ser: 1.19 mg/dL — ABNORMAL HIGH (ref 0.57–1.00)
GFR calc Af Amer: 49 mL/min/{1.73_m2} — ABNORMAL LOW (ref >59)
GFR calc non Af Amer: 42 mL/min/{1.73_m2} — ABNORMAL LOW (ref >59)
Globulin, Total: 3.1 g/dL (ref 1.5–4.5)
Glucose: 100 mg/dL — ABNORMAL HIGH (ref 65–99)
Potassium: 4.3 mmol/L (ref 3.5–5.2)
Sodium: 139 mmol/L (ref 134–144)
Total Protein: 7.2 g/dL (ref 6.0–8.5)

## 2020-06-13 LAB — LIPID PANEL
Chol/HDL Ratio: 2.5 ratio (ref 0.0–4.4)
Cholesterol, Total: 127 mg/dL (ref 100–199)
HDL: 50 mg/dL (ref >39)
LDL Chol Calc (NIH): 59 mg/dL (ref 0–99)
Triglycerides: 98 mg/dL (ref 0–149)
VLDL Cholesterol Cal: 18 mg/dL (ref 5–40)

## 2020-06-13 LAB — T4: T4, Total: 8.7 ug/dL (ref 4.5–12.0)

## 2020-06-13 LAB — HEMOGLOBIN A1C
Est. average glucose Bld gHb Est-mCnc: 255 mg/dL
Hgb A1c MFr Bld: 10.5 % — ABNORMAL HIGH (ref 4.8–5.6)

## 2020-06-13 LAB — TSH: TSH: 1.12 u[IU]/mL (ref 0.450–4.500)

## 2020-06-13 LAB — HEPATITIS C ANTIBODY: Hep C Virus Ab: <0.1 s/co ratio (ref 0.0–0.9)

## 2020-06-13 LAB — T3, FREE: T3, Free: 2.9 pg/mL (ref 2.0–4.4)

## 2020-06-13 NOTE — Telephone Encounter (Signed)
Left message for pt to call to schedule an appt if interested in getting diabetic shoes. If we are to try to get them for this year pt would need to come in to be measured within the next 3 wks or so.

## 2020-06-14 ENCOUNTER — Other Ambulatory Visit: Payer: Self-pay | Admitting: Nurse Practitioner

## 2020-06-14 DIAGNOSIS — E118 Type 2 diabetes mellitus with unspecified complications: Secondary | ICD-10-CM

## 2020-06-14 MED ORDER — TRULICITY 3 MG/0.5ML ~~LOC~~ SOAJ: 3.0000 mg | SUBCUTANEOUS | 1 refills | Status: DC

## 2020-06-14 NOTE — Progress Notes (Signed)
I am increasing her Trulicity to 3 mg weekly, I have sent a new Rx to the pharmacy

## 2020-06-28 ENCOUNTER — Other Ambulatory Visit: Payer: Self-pay | Admitting: Nurse Practitioner

## 2020-07-01 ENCOUNTER — Other Ambulatory Visit: Payer: Self-pay | Admitting: Nurse Practitioner

## 2020-07-12 ENCOUNTER — Encounter: Payer: Self-pay | Admitting: Podiatry

## 2020-07-12 ENCOUNTER — Other Ambulatory Visit: Payer: Self-pay

## 2020-07-12 ENCOUNTER — Ambulatory Visit: Payer: Medicare Other | Admitting: Podiatry

## 2020-07-12 DIAGNOSIS — E118 Type 2 diabetes mellitus with unspecified complications: Secondary | ICD-10-CM

## 2020-07-12 DIAGNOSIS — M79674 Pain in right toe(s): Secondary | ICD-10-CM

## 2020-07-12 DIAGNOSIS — M2011 Hallux valgus (acquired), right foot: Secondary | ICD-10-CM

## 2020-07-12 DIAGNOSIS — Z794 Long term (current) use of insulin: Secondary | ICD-10-CM

## 2020-07-12 DIAGNOSIS — B351 Tinea unguium: Secondary | ICD-10-CM | POA: Diagnosis not present

## 2020-07-12 DIAGNOSIS — M79675 Pain in left toe(s): Secondary | ICD-10-CM | POA: Diagnosis not present

## 2020-07-12 DIAGNOSIS — E119 Type 2 diabetes mellitus without complications: Secondary | ICD-10-CM | POA: Diagnosis not present

## 2020-07-12 DIAGNOSIS — M2012 Hallux valgus (acquired), left foot: Secondary | ICD-10-CM

## 2020-07-14 NOTE — Progress Notes (Signed)
ANNUAL DIABETIC FOOT EXAM  Subjective: [name] presents today for for annual diabetic foot examination, at risk foot care with history of diabetic neuropathy and painful thick toenails that are difficult to trim. Pain interferes with ambulation. Aggravating factors include wearing enclosed shoe gear. Pain is relieved with periodic professional debridement..  Patient relates 20 year h/o diabetes.  Patient denies any h/o foot wounds.  Patient admits symptoms of foot numbness.  Patient admits symptoms of foot tingling.  Patient admits symptoms of burning in feet.  Patient's blood sugar was 126 mg/dl this morning.   [name], FNP is patient's PCP. Last visit was 06/14/2020.  She is inquiring about obtaining diabetic shoe on today's visit.  Past Medical History:  Diagnosis Date  . Arthritis   . Asthma   . CAD (coronary artery disease)    s/p NSTEMI 10/19 - LHC:  pRCA 99, oLCx 30 >> DES to RCA; Echo 10/19: Mild focal basal septal hypertrophy, EF 60-65, inf-lat, inf and inf-sept HK, trivial MR, trivial TR  . Diabetes mellitus without complication ([place])   . Hypertension    Patient Active Problem List   Diagnosis Date Noted  . Acquired hypothyroidism 03/12/2020  . DKA (diabetic ketoacidoses) 08/31/2019  . COVID-19 virus infection 08/31/2019  . AKI (acute kidney injury) ([place]) 08/31/2019  . CAD (coronary artery disease) 09/27/2018  . Hypertension 06/16/2018  . Hyperlipidemia 06/16/2018  . Type 2 diabetes mellitus with complication, with long-term current use of insulin ([place]) 06/16/2018  . Hx of NSTEMI 10/19 Tx with DES to [facility] 06/14/2018  . Lymphoproliferative disease ([place]) 10/03/2015   Past Surgical History:  Procedure Laterality Date  . ABDOMINAL HYSTERECTOMY  "many yrs ago"  . AXILLARY LYMPH NODE BIOPSY Right 02/26/2015   Procedure: RIGHT AXILLARY LYMPH NODE BIOPSY;  Surgeon: Jackolyn Confer, MD;  Location: [facility];  Service: General;  Laterality: Right;  .  CHOLECYSTECTOMY    . CORONARY STENT INTERVENTION N/A 06/15/2018   Procedure: CORONARY STENT INTERVENTION;  Surgeon: Wellington Hampshire, MD;  Location: [place] [facility];  Service: Cardiovascular;  Laterality: N/A;  . LEFT HEART CATH AND CORONARY ANGIOGRAPHY N/A 06/15/2018   Procedure: LEFT HEART CATH AND CORONARY ANGIOGRAPHY;  Surgeon: Wellington Hampshire, MD;  Location: [place] [facility];  Service: Cardiovascular;  Laterality: N/A;   Current Outpatient Medications on File Prior to Visit  Medication Sig Dispense Refill  . Accu-Chek FastClix Lancets MISC USE AS DIRECTED FOR DAILY TESTING 102 each 2  . ACCU-CHEK GUIDE test strip daily. use as directed    . acetaminophen (TYLENOL) 325 MG tablet Take by mouth every 6 (six) hours as needed for mild pain.    Marland Kitchen amLODipine-olmesartan (AZOR) 5-20 MG per tablet Take 1 tablet by mouth every morning.     Jearl Klinefelter ELLIPTA 62.5-25 MCG/INH AEPB Inhale 1 puff by mouth once daily 60 each 0  . Cholecalciferol (VITAMIN D3) 1.25 MG (50000 UT) CAPS TAKE 1 CAPSULE BY MOUTH ONCE A WEEK ON  FRIDAY 12 capsule 0  . clopidogrel (PLAVIX) 75 MG tablet Take 1 tablet by mouth once daily with breakfast 90 tablet 3  . Dulaglutide (TRULICITY) 3 RX/5.4MG SOPN Inject 3 mg as directed once a week. 6 mL 1  . insulin glargine (LANTUS) 100 unit/mL SOPN Inject 0.3 mLs (30 Units total) into the skin daily. 15 mL 3  . Insulin Pen Needle (RELION PEN NEEDLE 31G/8MM) 31G X 8 MM MISC Use as directed 100 each 2  . levothyroxine (SYNTHROID)  50 MCG tablet Take 1 tablet by mouth once daily 90 tablet 0  . metoprolol succinate (TOPROL-XL) 25 MG 24 hr tablet Take 25 mg by mouth daily.    . nitroGLYCERIN (NITROSTAT) 0.4 MG SL tablet Place 1 tablet (0.4 mg total) under the tongue every 5 (five) minutes x 3 doses as needed for chest pain. 25 tablet 7  . rosuvastatin (CRESTOR) 20 MG tablet Take 20 mg by mouth daily.    . Vitamin D, Ergocalciferol, (DRISDOL) 1.25 MG (50000 UNIT) CAPS capsule Take 50,000  Units by mouth once a week.    Marland Kitchen XIIDRA 5 % SOLN Place 2 drops into both eyes 2 (two) times daily.      No current facility-administered medications on file prior to visit.    No Known Allergies Social History   Occupational History  . Occupation: retired  Tobacco Use  . Smoking status: Former Smoker    Packs/day: 0.50    Years: 60.00    Pack years: 30.00  . Smokeless tobacco: Never Used  Vaping Use  . Vaping Use: Never used  Substance and Sexual Activity  . Alcohol use: No  . Drug use: No  . Sexual activity: Not on file   Family History  Problem Relation Age of Onset  . Diabetes Mother   . Heart disease Mother   . Diabetes Father   . Diabetes Sister   . Heart disease Sister   . Diabetes Brother    Immunization History  Administered Date(s) Administered  . Influenza-Unspecified 06/24/2018, 04/22/2020  . PFIZER SARS-COV-2 Vaccination 10/17/2019, 11/07/2019, 06/03/2020     Review of Systems: Negative except as noted in the HPI.  Objective: There were no vitals filed for this visit.  [name] is a pleasant 85 y.o. female in NAD. AAO X 3.  Vascular Examination: Capillary refill time to digits immediate b/l. Palpable pedal pulses b/l LE. Pedal hair present. Lower extremity skin temperature gradient within normal limits.  Dermatological Examination: Pedal skin with normal turgor, texture and tone bilaterally. No open wounds bilaterally. No interdigital macerations bilaterally. Toenails 1-5 b/l elongated, discolored, dystrophic, thickened, crumbly with subungual debris and tenderness to dorsal palpation.  Musculoskeletal Examination: Normal muscle strength 5/5 to all lower extremity muscle groups bilaterally. No pain crepitus or joint limitation noted with ROM b/l. Hallux valgus with bunion deformity noted b/l lower extremities.  Footwear Assessment: Does the patient wear appropriate shoes? Yes. Does the patient need inserts/orthotics? No.  Neurological  Examination: Pt has subjective symptoms of neuropathy. Protective sensation decreased with 10 gram monofilament b/l. Vibratory sensation decreased b/l.  Hemoglobin A1C Latest Ref Rng & Units 06/12/2020 03/12/2020 12/11/2019 09/04/2019 09/01/2019  HGBA1C 4.8 - 5.6 % 10.5(H) 8.8(H) 9.0(H) 12.0(H) 12.0(H)  Some recent data might be hidden   Assessment: 1. Pain due to onychomycosis of toenails of both feet   2. Hallux valgus, acquired, bilateral   3. Type 2 diabetes mellitus with complication, with long-term current use of insulin ([place])   4. Encounter for diabetic foot exam ([place])      ADA FOOT RISK CATEGORIZATION High Risk  Patient has one or more of the following: Loss of protective sensation Absent pedal pulses Severe Foot deformity History of foot ulcer  Plan: -Examined patient. -Diabetic foot examination performed on today's visit. -Patient to continue soft, supportive shoe gear daily. -Toenails 1-5 b/l were debrided in length and girth with sterile nail nippers and dremel without iatrogenic bleeding.  -Patient to report any pedal injuries to medical  professional immediately. -Patient/POA to call should there be question/concern in the interim.  Return in about 3 months (around 10/12/2020) for diabetic foot care.  Marzetta Board, DPM

## 2020-07-16 ENCOUNTER — Ambulatory Visit: Payer: Medicare Other | Admitting: Podiatry

## 2020-07-21 ENCOUNTER — Other Ambulatory Visit: Payer: Self-pay | Admitting: Physician Assistant

## 2020-07-22 ENCOUNTER — Other Ambulatory Visit: Payer: Self-pay

## 2020-07-22 MED ORDER — AMLODIPINE-OLMESARTAN 5-20 MG PO TABS
1.0000 | ORAL_TABLET | Freq: Every morning | ORAL | 0 refills | Status: DC
Start: 2020-07-22 — End: 2020-10-07

## 2020-07-29 ENCOUNTER — Other Ambulatory Visit: Payer: Self-pay | Admitting: Physician Assistant

## 2020-07-29 ENCOUNTER — Other Ambulatory Visit: Payer: Self-pay | Admitting: Nurse Practitioner

## 2020-07-29 DIAGNOSIS — I2510 Atherosclerotic heart disease of native coronary artery without angina pectoris: Secondary | ICD-10-CM

## 2020-08-06 ENCOUNTER — Other Ambulatory Visit: Payer: Self-pay | Admitting: Nurse Practitioner

## 2020-08-30 ENCOUNTER — Other Ambulatory Visit: Payer: Self-pay | Admitting: Nurse Practitioner

## 2020-08-30 DIAGNOSIS — Z794 Long term (current) use of insulin: Secondary | ICD-10-CM

## 2020-08-30 DIAGNOSIS — E118 Type 2 diabetes mellitus with unspecified complications: Secondary | ICD-10-CM

## 2020-09-02 ENCOUNTER — Other Ambulatory Visit: Payer: Self-pay | Admitting: Nurse Practitioner

## 2020-09-12 ENCOUNTER — Ambulatory Visit (INDEPENDENT_AMBULATORY_CARE_PROVIDER_SITE_OTHER): Payer: Medicare Other | Admitting: Nurse Practitioner

## 2020-09-12 ENCOUNTER — Other Ambulatory Visit: Payer: Self-pay

## 2020-09-12 ENCOUNTER — Encounter: Payer: Self-pay | Admitting: Nurse Practitioner

## 2020-09-12 VITALS — BP 122/68 | HR 74 | Temp 97.6°F | Ht 63.2 in | Wt 150.0 lb

## 2020-09-12 DIAGNOSIS — N1832 Chronic kidney disease, stage 3b: Secondary | ICD-10-CM

## 2020-09-12 DIAGNOSIS — E785 Hyperlipidemia, unspecified: Secondary | ICD-10-CM | POA: Diagnosis not present

## 2020-09-12 DIAGNOSIS — Z23 Encounter for immunization: Secondary | ICD-10-CM

## 2020-09-12 DIAGNOSIS — E1122 Type 2 diabetes mellitus with diabetic chronic kidney disease: Secondary | ICD-10-CM

## 2020-09-12 DIAGNOSIS — Z794 Long term (current) use of insulin: Secondary | ICD-10-CM | POA: Diagnosis not present

## 2020-09-12 DIAGNOSIS — N184 Chronic kidney disease, stage 4 (severe): Secondary | ICD-10-CM

## 2020-09-12 DIAGNOSIS — E559 Vitamin D deficiency, unspecified: Secondary | ICD-10-CM

## 2020-09-12 DIAGNOSIS — E118 Type 2 diabetes mellitus with unspecified complications: Secondary | ICD-10-CM

## 2020-09-12 DIAGNOSIS — E039 Hypothyroidism, unspecified: Secondary | ICD-10-CM | POA: Diagnosis not present

## 2020-09-12 MED ORDER — TETANUS-DIPHTH-ACELL PERTUSSIS 5-2.5-18.5 LF-MCG/0.5 IM SUSP
0.5000 mL | Freq: Once | INTRAMUSCULAR | 0 refills | Status: AC
Start: 2020-09-12 — End: 2020-09-12

## 2020-09-12 NOTE — Progress Notes (Signed)
I,Yamilka Roman [employer] as a Education administrator for [employer], FNP.,have documented all relevant documentation on the behalf of [name], FNP,as directed by  [name], FNP while in the presence of [name], [place]. This visit occurred during the SARS-CoV-2 public health emergency.  Safety protocols were in place, including screening questions prior to the visit, additional usage of staff PPE, and extensive cleaning of exam room while observing appropriate contact time as indicated for disinfecting solutions.  Subjective:     Patient ID: [name] , female    DOB: 11/14/1934 , 86 y.o.   MRN: [id number]   Chief Complaint  Patient presents with   Diabetes    HPI  Patient here for a f/u on her dm, thyroid and blood pressure.   Wt Readings from Last 3 Encounters: 09/12/20 : 150 lb (68 kg) 06/12/20 : 149 lb 7.6 oz (67.8 kg) 06/12/20 : 149 lb 6.4 oz (67.8 kg)  Diabetes She presents for her follow-up diabetic visit. She has type 2 diabetes mellitus. Her disease course has been stable. There are no hypoglycemic associated symptoms. Pertinent negatives for diabetes include no polydipsia, no polyphagia and no polyuria. There are no hypoglycemic complications. There are no diabetic complications. Risk factors for coronary artery disease include sedentary lifestyle and hypertension. She has not had a previous visit with a dietitian. She rarely participates in exercise. (Blood sugars are averaging 120's) An ACE inhibitor/angiotensin II receptor blocker is being taken. Eye exam is not current.     Past Medical History:  Diagnosis Date   Arthritis    Asthma    CAD (coronary artery disease)    s/p NSTEMI 10/19 - LHC:  pRCA 99, oLCx 30 >> DES to RCA; Echo 10/19: Mild focal basal septal hypertrophy, EF 60-65, inf-lat, inf and inf-sept HK, trivial MR, trivial TR   Diabetes mellitus without complication ([place])    Hypertension      Family History  Problem Relation Age of Onset    Diabetes Mother    Heart disease Mother    Diabetes Father    Diabetes Sister    Heart disease Sister    Diabetes Brother      Current Outpatient Medications:    Accu-Chek FastClix Lancets MISC, USE AS DIRECTED FOR DAILY TESTING, Disp: 102 each, Rfl: 2   ACCU-CHEK GUIDE test strip, daily. use as directed, Disp: , Rfl:    acetaminophen (TYLENOL) 325 MG tablet, Take by mouth every 6 (six) hours as needed for mild pain., Disp: , Rfl:    amLODipine-olmesartan (AZOR) 5-20 MG tablet, Take 1 tablet by mouth every morning., Disp: 90 tablet, Rfl: 0   ANORO ELLIPTA 62.5-25 MCG/INH AEPB, Inhale 1 puff by mouth once daily, Disp: 60 each, Rfl: 0   Cholecalciferol (VITAMIN D3) 1.25 MG (50000 UT) CAPS, TAKE 1 CAPSULE BY MOUTH ONCE A WEEK ON  FRIDAY, Disp: 12 capsule, Rfl: 0   clopidogrel (PLAVIX) 75 MG tablet, Take 1 tablet by mouth once daily with breakfast, Disp: 90 tablet, Rfl: 3   Dulaglutide (TRULICITY) 3 YI/5.0YD SOPN, Inject 3 mg as directed once a week., Disp: 6 mL, Rfl: 1   Insulin Pen Needle (RELION PEN NEEDLE 31G/8MM) 31G X 8 MM MISC, Use as directed, Disp: 100 each, Rfl: 2   LANTUS SOLOSTAR 100 UNIT/ML Solostar Pen, INJECT 0.3 MLS (30 UNITS) INTO THE SKIN DAILY, Disp: 15 mL, Rfl: 0   levothyroxine (SYNTHROID) 50 MCG tablet, Take 1 tablet by mouth once daily, Disp: 90 tablet,  Rfl: 0   metoprolol succinate (TOPROL-XL) 25 MG 24 hr tablet, Take 1 tablet by mouth once daily, Disp: 90 tablet, Rfl: 2   nitroGLYCERIN (NITROSTAT) 0.4 MG SL tablet, Place 1 tablet (0.4 mg total) under the tongue every 5 (five) minutes x 3 doses as needed for chest pain., Disp: 25 tablet, Rfl: 7   rosuvastatin (CRESTOR) 20 MG tablet, Take 1 tablet by mouth once daily, Disp: 90 tablet, Rfl: 0   Vitamin D, Ergocalciferol, (DRISDOL) 1.25 MG (50000 UNIT) CAPS capsule, Take 50,000 Units by mouth once a week., Disp: , Rfl:    XIIDRA 5 % SOLN, Place 2 drops into both eyes 2 (two) times daily. , Disp: , Rfl:     empagliflozin (JARDIANCE) 10 MG TABS tablet, Take 1 tablet (10 mg total) by mouth daily before breakfast., Disp: 30 tablet, Rfl: 5   No Known Allergies   Review of Systems  Constitutional: Negative.   HENT: Negative.   Eyes: Negative.   Respiratory: Negative.   Cardiovascular: Negative.   Gastrointestinal: Negative.   Endocrine: Negative for polydipsia, polyphagia and polyuria.  Genitourinary: Negative.   Musculoskeletal: Negative.   Skin: Negative.   Neurological: Negative.   Hematological: Negative.   Psychiatric/Behavioral: Negative.      Today's Vitals   09/12/20 0948  BP: 122/68  Pulse: 74  Temp: 97.6 F (36.4 C)  TempSrc: Oral  Weight: 150 lb (68 kg)  Height: 5' 3.2" (1.605 m)  PainSc: 0-No pain   Body mass index is 26.4 kg/m.   Objective:  Physical Exam Constitutional:      General: She is not in acute distress.    Appearance: Normal appearance. She is well-developed. She is obese.  HENT:     Head: Normocephalic and atraumatic.     Right Ear: Hearing, tympanic membrane, ear canal and external ear normal. There is no impacted cerumen.     Left Ear: Hearing, tympanic membrane, ear canal and external ear normal. There is no impacted cerumen.     Nose:     Comments: Deferred - masked    Mouth/Throat:     Comments: Deferred - masked Eyes:     General: Lids are normal.     Extraocular Movements: Extraocular movements intact.     Conjunctiva/sclera: Conjunctivae normal.     Pupils: Pupils are equal, round, and reactive to light.     Funduscopic exam:    Right eye: No papilledema.        Left eye: No papilledema.  Neck:     Thyroid: No thyroid mass.     Vascular: No carotid bruit.  Cardiovascular:     Rate and Rhythm: Normal rate and regular rhythm.     Pulses: Normal pulses.     Heart sounds: Normal heart sounds. No murmur heard.   Pulmonary:     Effort: Pulmonary effort is normal.     Breath sounds: Normal breath sounds.  Chest:     Chest  wall: No mass.  Breasts:     Tanner Score is 5.     Right: Normal. No mass, tenderness, axillary adenopathy or supraclavicular adenopathy.     Left: Normal. No mass, tenderness, axillary adenopathy or supraclavicular adenopathy.    Abdominal:     General: Abdomen is flat. Bowel sounds are normal. There is no distension.     Palpations: Abdomen is soft.     Tenderness: There is no abdominal tenderness.  Genitourinary:    Rectum: Guaiac result negative.  Musculoskeletal:  General: No swelling. Normal range of motion.     Cervical back: Full passive range of motion without pain, normal range of motion and neck supple.     Right lower leg: No edema.     Left lower leg: No edema.  Lymphadenopathy:     Upper Body:     Right upper body: No supraclavicular, axillary or pectoral adenopathy.     Left upper body: No supraclavicular, axillary or pectoral adenopathy.  Skin:    General: Skin is warm and dry.     Capillary Refill: Capillary refill takes less than 2 seconds.  Neurological:     General: No focal deficit present.     Mental Status: She is alert and oriented to person, place, and time.     Cranial Nerves: No cranial nerve deficit.     Sensory: No sensory deficit.  Psychiatric:        Mood and Affect: Mood normal.        Behavior: Behavior normal.        Thought Content: Thought content normal.        Judgment: Judgment normal.         Assessment And Plan:     1. Type 2 diabetes mellitus with stage 3b chronic kidney disease, with long-term current use of insulin (HCC)  Chronic, she feels she is doing well  HgbA1c was elevated at last visit increase her trulicity to 3 mg weekly, will recheck today - CMP14+EGFR - Hemoglobin A1c  2. Acquired hypothyroidism  Chronic, stable  Continue current medications pending labs - TSH - T4 - T3, free  3. Vitamin D deficiency  Will check vitamin D level and supplement as needed.     Also encouraged to spend 15 minutes  in the sun daily.   4. Hyperlipidemia, unspecified hyperlipidemia type  Chronic, controlled  Continue with current medications, tolerating medications well.  - Lipid panel  5. Encounter for immunization Sent rx to pharmacy for tetanus - Tdap (BOOSTRIX) 5-2.5-18.5 LF-MCG/0.5 injection; Inject 0.5 mLs into the muscle once for 1 dose.  Dispense: 0.5 mL; Refill: 0     Patient was given opportunity to ask questions. Patient verbalized understanding of the plan and was able to repeat key elements of the plan. All questions were answered to their satisfaction.  [name], FNP   I, [name], FNP, have reviewed all documentation for this visit. The documentation on 09/13/20 for the exam, diagnosis, procedures, and orders are all accurate and complete.   THE PATIENT IS ENCOURAGED TO PRACTICE SOCIAL DISTANCING DUE TO THE COVID-19 PANDEMIC.

## 2020-09-12 NOTE — Patient Instructions (Signed)
Diabetes Mellitus Basics  Diabetes mellitus, or diabetes, is a long-term (chronic) disease. It occurs when the body does not properly use sugar (glucose) that is released from food after you eat. Diabetes mellitus may be caused by one or both of these problems:  Your pancreas does not make enough of a hormone called insulin.  Your body does not react in a normal way to the insulin that it makes. Insulin lets glucose enter cells in your body. This gives you energy. If you have diabetes, glucose cannot get into cells. This causes high blood glucose (hyperglycemia). How to treat and manage diabetes You may need to take insulin or other diabetes medicines daily to keep your glucose in balance. If you are prescribed insulin, you will learn how to give yourself insulin by injection. You may need to adjust the amount of insulin you take based on the foods that you eat. You will need to check your blood glucose levels using a glucose monitor as told by your health care provider. The readings can help determine if you have low or high blood glucose. Generally, you should have these blood glucose levels:  Before meals (preprandial): 80-130 mg/dL (4.4-7.2 mmol/L).  After meals (postprandial): below 180 mg/dL (10 mmol/L).  Hemoglobin A1c (HbA1c) level: less than 7%. Your health care provider will set treatment goals for you. Keep all follow-up visits. This is important. Follow these instructions at home: Diabetes medicines Take your diabetes medicines every day as told by your health care provider. List your diabetes medicines here:  Name of medicine: ______________________________ ? Amount (dose): _______________ Time (a.m./p.m.): _______________ Notes: ___________________________________  Name of medicine: ______________________________ ? Amount (dose): _______________ Time (a.m./p.m.): _______________ Notes: ___________________________________  Name of medicine:  ______________________________ ? Amount (dose): _______________ Time (a.m./p.m.): _______________ Notes: ___________________________________ Insulin If you use insulin, list the types of insulin you use here:  Insulin type: ______________________________ ? Amount (dose): _______________ Time (a.m./p.m.): _______________Notes: ___________________________________  Insulin type: ______________________________ ? Amount (dose): _______________ Time (a.m./p.m.): _______________ Notes: ___________________________________  Insulin type: ______________________________ ? Amount (dose): _______________ Time (a.m./p.m.): _______________ Notes: ___________________________________  Insulin type: ______________________________ ? Amount (dose): _______________ Time (a.m./p.m.): _______________ Notes: ___________________________________  Insulin type: ______________________________ ? Amount (dose): _______________ Time (a.m./p.m.): _______________ Notes: ___________________________________ Managing blood glucose Check your blood glucose levels using a glucose monitor as told by your health care provider. Write down the times that you check your glucose levels here:  Time: _______________ Notes: ___________________________________  Time: _______________ Notes: ___________________________________  Time: _______________ Notes: ___________________________________  Time: _______________ Notes: ___________________________________  Time: _______________ Notes: ___________________________________  Time: _______________ Notes: ___________________________________   Low blood glucose Low blood glucose (hypoglycemia) is when glucose is at or below 70 mg/dL (3.9 mmol/L). Symptoms may include:  Feeling: ? Hungry. ? Sweaty and clammy. ? Irritable or easily upset. ? Dizzy. ? Sleepy.  Having: ? A fast heartbeat. ? A headache. ? A change in your vision. ? Numbness around the mouth, lips, or  tongue.  Having trouble with: ? Moving (coordination). ? Sleeping. Treating low blood glucose To treat low blood glucose, eat or drink something containing sugar right away. If you can think clearly and swallow safely, follow the 15:15 rule:  Take 15 grams of a fast-acting carb (carbohydrate), as told by your health care provider.  Some fast-acting carbs are: ? Glucose tablets: take 3-4 tablets. ? Hard candy: eat 3-5 pieces. ? Fruit juice: drink 4 oz (120 mL). ? Regular (not diet) soda: drink 4-6 oz (120-180 mL). ? Honey or sugar:   eat 1 Tbsp (15 mL).  Check your blood glucose levels 15 minutes after you take the carb.  If your glucose is still at or below 70 mg/dL (3.9 mmol/L), take 15 grams of a carb again.  If your glucose does not go above 70 mg/dL (3.9 mmol/L) after 3 tries, get help right away.  After your glucose goes back to normal, eat a meal or a snack within 1 hour. Treating very low blood glucose If your glucose is at or below 54 mg/dL (3 mmol/L), you have very low blood glucose (severe hypoglycemia). This is an emergency. Do not wait to see if the symptoms will go away. Get medical help right away. Call your local emergency services (911 in the [place] Do not drive yourself to the hospital. Questions to ask your health care provider  Should I talk with a diabetes educator?  What equipment will I need to care for myself at home?  What diabetes medicines do I need? When should I take them?  How often do I need to check my blood glucose levels?  What number can I call if I have questions?  When is my follow-up visit?  Where can I find a support group for people with diabetes? Where to find more information  American Diabetes Association: www.diabetes.org  Association of Diabetes Care and Education Specialists: www.diabeteseducator.org Contact a health care provider if:  Your blood glucose is at or above 240 mg/dL (13.3 mmol/L) for 2 days in a row.  You have  been sick or have had a fever for 2 days or more, and you are not getting better.  You have any of these problems for more than 6 hours: ? You cannot eat or drink. ? You feel nauseous. ? You vomit. ? You have diarrhea. Get help right away if:  Your blood glucose is lower than 54 mg/dL (3 mmol/L).  You get confused.  You have trouble thinking clearly.  You have trouble breathing. These symptoms may represent a serious problem that is an emergency. Do not wait to see if the symptoms will go away. Get medical help right away. Call your local emergency services (911 in the [place] Do not drive yourself to the hospital. Summary  Diabetes mellitus is a chronic disease that occurs when the body does not properly use sugar (glucose) that is released from food after you eat.  Take insulin and diabetes medicines as told.  Check your blood glucose every day, as often as told.  Keep all follow-up visits. This is important. This information is not intended to replace advice given to you by your health care provider. Make sure you discuss any questions you have with your health care provider. Document Revised: 12/12/2019 Document Reviewed: 12/12/2019 Elsevier Patient Education  2021 [employer]  

## 2020-09-13 ENCOUNTER — Other Ambulatory Visit: Payer: Self-pay | Admitting: Nurse Practitioner

## 2020-09-13 DIAGNOSIS — Z794 Long term (current) use of insulin: Secondary | ICD-10-CM

## 2020-09-13 DIAGNOSIS — E1122 Type 2 diabetes mellitus with diabetic chronic kidney disease: Secondary | ICD-10-CM

## 2020-09-13 LAB — CMP14+EGFR
ALT: 23 IU/L (ref 0–32)
AST: 18 IU/L (ref 0–40)
Albumin/Globulin Ratio: 1.3 (ref 1.2–2.2)
Albumin: 4.2 g/dL (ref 3.6–4.6)
Alkaline Phosphatase: 83 IU/L (ref 44–121)
BUN/Creatinine Ratio: 7 — ABNORMAL LOW (ref 12–28)
BUN: 10 mg/dL (ref 8–27)
Bilirubin Total: 0.4 mg/dL (ref 0.0–1.2)
CO2: 25 mmol/L (ref 20–29)
Calcium: 9.9 mg/dL (ref 8.7–10.3)
Chloride: 103 mmol/L (ref 96–106)
Creatinine, Ser: 1.34 mg/dL — ABNORMAL HIGH (ref 0.57–1.00)
GFR calc Af Amer: 42 mL/min/{1.73_m2} — ABNORMAL LOW (ref >59)
GFR calc non Af Amer: 37 mL/min/{1.73_m2} — ABNORMAL LOW (ref >59)
Globulin, Total: 3.2 g/dL (ref 1.5–4.5)
Glucose: 173 mg/dL — ABNORMAL HIGH (ref 65–99)
Potassium: 4.5 mmol/L (ref 3.5–5.2)
Sodium: 141 mmol/L (ref 134–144)
Total Protein: 7.4 g/dL (ref 6.0–8.5)

## 2020-09-13 LAB — T4: T4, Total: 8.6 ug/dL (ref 4.5–12.0)

## 2020-09-13 LAB — HEMOGLOBIN A1C
Est. average glucose Bld gHb Est-mCnc: 243 mg/dL
Hgb A1c MFr Bld: 10.1 % — ABNORMAL HIGH (ref 4.8–5.6)

## 2020-09-13 LAB — LIPID PANEL
Chol/HDL Ratio: 3.3 ratio (ref 0.0–4.4)
Cholesterol, Total: 148 mg/dL (ref 100–199)
HDL: 45 mg/dL (ref >39)
LDL Chol Calc (NIH): 79 mg/dL (ref 0–99)
Triglycerides: 135 mg/dL (ref 0–149)
VLDL Cholesterol Cal: 24 mg/dL (ref 5–40)

## 2020-09-13 LAB — TSH: TSH: 1.4 u[IU]/mL (ref 0.450–4.500)

## 2020-09-13 LAB — T3, FREE: T3, Free: 2.9 pg/mL (ref 2.0–4.4)

## 2020-09-13 MED ORDER — EMPAGLIFLOZIN 10 MG PO TABS: 10.0000 mg | ORAL_TABLET | Freq: Every day | ORAL | 5 refills | Status: DC

## 2020-10-05 ENCOUNTER — Other Ambulatory Visit: Payer: Self-pay | Admitting: Nurse Practitioner

## 2020-10-05 ENCOUNTER — Other Ambulatory Visit: Payer: Self-pay | Admitting: Physician Assistant

## 2020-10-15 ENCOUNTER — Other Ambulatory Visit: Payer: Self-pay | Admitting: Nurse Practitioner

## 2020-10-21 ENCOUNTER — Other Ambulatory Visit: Payer: Self-pay | Admitting: Nurse Practitioner

## 2020-10-21 DIAGNOSIS — E118 Type 2 diabetes mellitus with unspecified complications: Secondary | ICD-10-CM

## 2020-10-21 DIAGNOSIS — Z794 Long term (current) use of insulin: Secondary | ICD-10-CM

## 2020-10-22 ENCOUNTER — Ambulatory Visit: Payer: Medicare Other | Admitting: Podiatry

## 2020-10-22 ENCOUNTER — Other Ambulatory Visit: Payer: Self-pay

## 2020-10-22 ENCOUNTER — Encounter: Payer: Self-pay | Admitting: Podiatry

## 2020-10-22 DIAGNOSIS — B351 Tinea unguium: Secondary | ICD-10-CM

## 2020-10-22 DIAGNOSIS — E1142 Type 2 diabetes mellitus with diabetic polyneuropathy: Secondary | ICD-10-CM | POA: Diagnosis not present

## 2020-10-22 DIAGNOSIS — M79674 Pain in right toe(s): Secondary | ICD-10-CM

## 2020-10-22 DIAGNOSIS — Z794 Long term (current) use of insulin: Secondary | ICD-10-CM

## 2020-10-22 DIAGNOSIS — E118 Type 2 diabetes mellitus with unspecified complications: Secondary | ICD-10-CM

## 2020-10-22 DIAGNOSIS — K219 Gastro-esophageal reflux disease without esophagitis: Secondary | ICD-10-CM | POA: Insufficient documentation

## 2020-10-22 DIAGNOSIS — Z1211 Encounter for screening for malignant neoplasm of colon: Secondary | ICD-10-CM | POA: Insufficient documentation

## 2020-10-22 DIAGNOSIS — M2011 Hallux valgus (acquired), right foot: Secondary | ICD-10-CM

## 2020-10-22 DIAGNOSIS — M79675 Pain in left toe(s): Secondary | ICD-10-CM

## 2020-10-22 DIAGNOSIS — M2012 Hallux valgus (acquired), left foot: Secondary | ICD-10-CM

## 2020-10-27 NOTE — Progress Notes (Signed)
Subjective: [name] presents today for at risk foot care with history of diabetic neuropathy and painful thick toenails that are difficult to trim. Pain interferes with ambulation. Aggravating factors include wearing enclosed shoe gear. Pain is relieved with periodic professional debridement.[name], FNP is patient's PCP. Last visit was 09/12/2020.  She states her blood glucose was 125 mg/dl this morning.   No Known Allergies  Review of Systems: Negative except as noted in the HPI.  Objective: There were no vitals filed for this visit.  [name] is a pleasant 86 y.o. female in NAD. AAO X 3.  Vascular Examination: Capillary refill time to digits immediate b/l. Palpable pedal pulses b/l LE. Pedal hair present. Lower extremity skin temperature gradient within normal limits.  Dermatological Examination: Pedal skin with normal turgor, texture and tone bilaterally. No open wounds bilaterally. No interdigital macerations bilaterally. Toenails 1-5 b/l elongated, discolored, dystrophic, thickened, crumbly with subungual debris and tenderness to dorsal palpation.  Musculoskeletal Examination: Normal muscle strength 5/5 to all lower extremity muscle groups bilaterally. No pain crepitus or joint limitation noted with ROM b/l. Hallux valgus with bunion deformity noted b/l lower extremities.  Neurological Examination: Pt has subjective symptoms of neuropathy. Protective sensation decreased with 10 gram monofilament b/l. Vibratory sensation decreased b/l.  Hemoglobin A1C Latest Ref Rng & Units 09/12/2020 06/12/2020 03/12/2020 12/11/2019  HGBA1C 4.8 - 5.6 % 10.1(H) 10.5(H) 8.8(H) 9.0(H)  Some recent data might be hidden   Assessment: 1. Pain due to onychomycosis of toenails of both feet   2. Hallux valgus, acquired, bilateral   3. Diabetic peripheral neuropathy associated with type 2 diabetes mellitus ([place])   4. Type 2 diabetes mellitus with complication, with  long-term current use of insulin (HCC)     Plan: -Examined patient. -Diabetic foot examination performed on today's visit. -Patient to continue soft, supportive shoe gear daily. -Toenails 1-5 b/l were debrided in length and girth with sterile nail nippers and dremel without iatrogenic bleeding.  -Patient to report any pedal injuries to medical professional immediately. -Patient/POA to call should there be question/concern in the interim.  Return in about 3 months (around 01/22/2021).  Marzetta Board, DPM

## 2020-10-31 ENCOUNTER — Other Ambulatory Visit: Payer: Self-pay | Admitting: Nurse Practitioner

## 2020-11-13 ENCOUNTER — Other Ambulatory Visit: Payer: Self-pay | Admitting: Nurse Practitioner

## 2020-11-13 ENCOUNTER — Other Ambulatory Visit: Payer: Self-pay | Admitting: Physician Assistant

## 2020-11-13 DIAGNOSIS — I2510 Atherosclerotic heart disease of native coronary artery without angina pectoris: Secondary | ICD-10-CM

## 2020-12-05 DIAGNOSIS — I129 Hypertensive chronic kidney disease with stage 1 through stage 4 chronic kidney disease, or unspecified chronic kidney disease: Secondary | ICD-10-CM | POA: Diagnosis not present

## 2020-12-05 DIAGNOSIS — I2510 Atherosclerotic heart disease of native coronary artery without angina pectoris: Secondary | ICD-10-CM | POA: Diagnosis not present

## 2020-12-05 DIAGNOSIS — N1831 Chronic kidney disease, stage 3a: Secondary | ICD-10-CM | POA: Diagnosis not present

## 2020-12-05 DIAGNOSIS — D479 Neoplasm of uncertain behavior of lymphoid, hematopoietic and related tissue, unspecified: Secondary | ICD-10-CM | POA: Diagnosis not present

## 2020-12-05 DIAGNOSIS — E1122 Type 2 diabetes mellitus with diabetic chronic kidney disease: Secondary | ICD-10-CM | POA: Diagnosis not present

## 2020-12-06 ENCOUNTER — Other Ambulatory Visit: Payer: Self-pay | Admitting: Nurse Practitioner

## 2020-12-10 ENCOUNTER — Other Ambulatory Visit: Payer: Self-pay | Admitting: Nurse Practitioner

## 2020-12-10 DIAGNOSIS — E118 Type 2 diabetes mellitus with unspecified complications: Secondary | ICD-10-CM

## 2020-12-10 DIAGNOSIS — H2511 Age-related nuclear cataract, right eye: Secondary | ICD-10-CM | POA: Diagnosis not present

## 2020-12-10 DIAGNOSIS — H40023 Open angle with borderline findings, high risk, bilateral: Secondary | ICD-10-CM | POA: Diagnosis not present

## 2020-12-10 DIAGNOSIS — Z794 Long term (current) use of insulin: Secondary | ICD-10-CM

## 2020-12-11 ENCOUNTER — Ambulatory Visit (INDEPENDENT_AMBULATORY_CARE_PROVIDER_SITE_OTHER): Payer: Medicare Other | Admitting: Nurse Practitioner

## 2020-12-11 ENCOUNTER — Other Ambulatory Visit: Payer: Self-pay

## 2020-12-11 VITALS — Temp 97.9°F | Ht 63.2 in | Wt 150.0 lb

## 2020-12-11 DIAGNOSIS — Z794 Long term (current) use of insulin: Secondary | ICD-10-CM | POA: Diagnosis not present

## 2020-12-11 DIAGNOSIS — N1832 Chronic kidney disease, stage 3b: Secondary | ICD-10-CM | POA: Diagnosis not present

## 2020-12-11 DIAGNOSIS — E039 Hypothyroidism, unspecified: Secondary | ICD-10-CM | POA: Diagnosis not present

## 2020-12-11 DIAGNOSIS — E559 Vitamin D deficiency, unspecified: Secondary | ICD-10-CM | POA: Diagnosis not present

## 2020-12-11 DIAGNOSIS — I129 Hypertensive chronic kidney disease with stage 1 through stage 4 chronic kidney disease, or unspecified chronic kidney disease: Secondary | ICD-10-CM | POA: Diagnosis not present

## 2020-12-11 DIAGNOSIS — E118 Type 2 diabetes mellitus with unspecified complications: Secondary | ICD-10-CM

## 2020-12-11 DIAGNOSIS — I10 Essential (primary) hypertension: Secondary | ICD-10-CM

## 2020-12-11 DIAGNOSIS — E785 Hyperlipidemia, unspecified: Secondary | ICD-10-CM | POA: Diagnosis not present

## 2020-12-11 MED ORDER — EMPAGLIFLOZIN 10 MG PO TABS: 10.0000 mg | ORAL_TABLET | Freq: Every day | ORAL | 1 refills | Status: DC

## 2020-12-11 NOTE — Progress Notes (Signed)
Rutherford Nail as a scribe for [name], FNP.,have documented all relevant documentation on the behalf of [name], FNP,as directed by  [name], FNP while in the presence of [name], [place]. This visit occurred during the SARS-CoV-2 public health emergency.  Safety protocols were in place, including screening questions prior to the visit, additional usage of staff PPE, and extensive cleaning of exam room while observing appropriate contact time as indicated for disinfecting solutions.  Subjective:     Patient ID: [name] , female    DOB: 1934/12/21 , 86 y.o.   MRN: [id number]   Chief Complaint  Patient presents with  . Diabetes  . Dizziness  . Hypertension    HPI  Patient here for a f/u on her dm, and blood pressure. She is compliant with all meds  She seen her Nephrologist last week and ophthalmologist yesterday.   Diabetes She presents for her follow-up diabetic visit. She has type 2 diabetes mellitus. Her disease course has been stable. Hypoglycemia symptoms include dizziness (reports she has vertigo and has been taking medications and it is better. ). Pertinent negatives for hypoglycemia include no headaches. Pertinent negatives for diabetes include no fatigue, no polydipsia, no polyphagia and no polyuria. There are no hypoglycemic complications. There are no diabetic complications. Risk factors for coronary artery disease include sedentary lifestyle and hypertension. She has not had a previous visit with a dietitian. She rarely participates in exercise. (Blood sugars are averaging 120's, she has been drinking boost - low sugar.) An ACE inhibitor/angiotensin II receptor blocker is being taken. Eye exam is not current.  Dizziness Pertinent negatives include no fatigue or headaches.     Past Medical History:  Diagnosis Date  . Arthritis   . Asthma   . CAD (coronary artery disease)    s/p NSTEMI 10/19 - LHC:  pRCA 99, oLCx 30 >> DES to RCA; Echo  10/19: Mild focal basal septal hypertrophy, EF 60-65, inf-lat, inf and inf-sept HK, trivial MR, trivial TR  . Diabetes mellitus without complication ([place])   . Hypertension      Family History  Problem Relation Age of Onset  . Diabetes Mother   . Heart disease Mother   . Diabetes Father   . Diabetes Sister   . Heart disease Sister   . Diabetes Brother      Current Outpatient Medications:  .  Accu-Chek FastClix Lancets MISC, USE AS DIRECTED FOR DAILY TESTING, Disp: 102 each, Rfl: 2 .  ACCU-CHEK GUIDE test strip, daily. use as directed, Disp: , Rfl:  .  acetaminophen (TYLENOL) 325 MG tablet, Take by mouth every 6 (six) hours as needed for mild pain., Disp: , Rfl:  .  amLODipine-olmesartan (AZOR) 5-20 MG tablet, TAKE 1 TABLET BY MOUTH ONCE DAILY IN THE MORNING, Disp: 90 tablet, Rfl: 0 .  ANORO ELLIPTA 62.5-25 MCG/INH AEPB, Inhale 1 puff by mouth once daily, Disp: 60 each, Rfl: 0 .  Cholecalciferol (VITAMIN D3) 1.25 MG (50000 UT) CAPS, TAKE 1 CAPSULE BY MOUTH EVERY FRIDAY, Disp: 12 capsule, Rfl: 0 .  clopidogrel (PLAVIX) 75 MG tablet, Take 1 tablet by mouth once daily with breakfast, Disp: 90 tablet, Rfl: 2 .  Insulin Pen Needle (RELION PEN NEEDLE 31G/8MM) 31G X 8 MM MISC, Use as directed, Disp: 100 each, Rfl: 2 .  LANTUS SOLOSTAR 100 UNIT/ML Solostar Pen, INJECT 30 UNITS SUBCUTANEOUSLY ONCE DAILY, Disp: 15 mL, Rfl: 0 .  levothyroxine (SYNTHROID) 50 MCG tablet, Take 1 tablet by mouth  once daily, Disp: 90 tablet, Rfl: 0 .  meclizine (ANTIVERT) 25 MG tablet, Take 25 mg by mouth daily as needed., Disp: , Rfl:  .  metoprolol succinate (TOPROL-XL) 25 MG 24 hr tablet, Take 1 tablet by mouth once daily, Disp: 90 tablet, Rfl: 2 .  rosuvastatin (CRESTOR) 20 MG tablet, Take 1 tablet by mouth once daily, Disp: 90 tablet, Rfl: 2 .  TRULICITY 3 VO/5.9YT SOPN, INJECT 3 MG AS DIRECTED INTO THE SKIN ONCE A WEEK, Disp: 12 mL, Rfl: 0 .  XIIDRA 5 % SOLN, Place 2 drops into both eyes 2 (two) times daily. ,  Disp: , Rfl:  .  empagliflozin (JARDIANCE) 10 MG TABS tablet, Take 1 tablet (10 mg total) by mouth daily before breakfast., Disp: 90 tablet, Rfl: 1 .  nitroGLYCERIN (NITROSTAT) 0.4 MG SL tablet, Place 1 tablet (0.4 mg total) under the tongue every 5 (five) minutes x 3 doses as needed for chest pain. (Patient not taking: Reported on 12/11/2020), Disp: 25 tablet, Rfl: 7   No Known Allergies   Review of Systems  Constitutional: Negative.  Negative for fatigue.  HENT: Negative.   Endocrine: Negative for polydipsia, polyphagia and polyuria.  Musculoskeletal: Negative.   Skin: Negative.   Neurological: Positive for dizziness (reports she has vertigo and has been taking medications and it is better. ). Negative for headaches.  Psychiatric/Behavioral: Negative.      Today's Vitals   12/11/20 1026  Temp: 97.9 F (36.6 C)  Weight: 150 lb (68 kg)  Height: 5' 3.2" (1.605 m)  PainSc: 0-No pain   Body mass index is 26.4 kg/m.  Wt Readings from Last 3 Encounters:  12/11/20 150 lb (68 kg)  09/12/20 150 lb (68 kg)  06/12/20 149 lb 7.6 oz (67.8 kg)   Objective:  Physical Exam Constitutional:      General: She is not in acute distress.    Appearance: Normal appearance. She is well-developed. She is obese.  HENT:     Head: Normocephalic and atraumatic.     Right Ear: Hearing, tympanic membrane, ear canal and external ear normal. There is no impacted cerumen.     Left Ear: Hearing, tympanic membrane, ear canal and external ear normal. There is no impacted cerumen.     Nose:     Comments: Deferred - masked    Mouth/Throat:     Comments: Deferred - masked Eyes:     General: Lids are normal.     Extraocular Movements: Extraocular movements intact.     Conjunctiva/sclera: Conjunctivae normal.     Pupils: Pupils are equal, round, and reactive to light.     Funduscopic exam:    Right eye: No papilledema.        Left eye: No papilledema.  Neck:     Thyroid: No thyroid mass.     Vascular: No  carotid bruit.  Cardiovascular:     Rate and Rhythm: Normal rate and regular rhythm.     Pulses: Normal pulses.     Heart sounds: Normal heart sounds. No murmur heard.   Pulmonary:     Effort: Pulmonary effort is normal.     Breath sounds: Normal breath sounds.  Chest:     Chest wall: No mass.  Breasts:     Tanner Score is 5.     Right: Normal. No mass, tenderness, axillary adenopathy or supraclavicular adenopathy.     Left: Normal. No mass, tenderness, axillary adenopathy or supraclavicular adenopathy.    Abdominal:  General: Abdomen is flat. Bowel sounds are normal. There is no distension.     Palpations: Abdomen is soft.     Tenderness: There is no abdominal tenderness.  Genitourinary:    Rectum: Guaiac result negative.  Musculoskeletal:        General: No swelling. Normal range of motion.     Cervical back: Full passive range of motion without pain, normal range of motion and neck supple.     Right lower leg: No edema.     Left lower leg: No edema.  Lymphadenopathy:     Upper Body:     Right upper body: No supraclavicular, axillary or pectoral adenopathy.     Left upper body: No supraclavicular, axillary or pectoral adenopathy.  Skin:    General: Skin is warm and dry.     Capillary Refill: Capillary refill takes less than 2 seconds.  Neurological:     General: No focal deficit present.     Mental Status: She is alert and oriented to person, place, and time.     Cranial Nerves: No cranial nerve deficit.     Sensory: No sensory deficit.  Psychiatric:        Mood and Affect: Mood normal.        Behavior: Behavior normal.        Thought Content: Thought content normal.        Judgment: Judgment normal.         Assessment And Plan:     1. Type 2 diabetes mellitus with complication, with long-term current use of insulin (HCC)  Chronic, controlled  Continue with current medications  Encouraged to limit intake of sugary foods and drinks  Encouraged to  increase physical activity to 150 minutes per week  Diabetic foot exam done, no abnormal findings  Eye exam is up to date - Lipid panel - CMP14+EGFR - Hemoglobin A1c - empagliflozin (JARDIANCE) 10 MG TABS tablet; Take 1 tablet (10 mg total) by mouth daily before breakfast.  Dispense: 90 tablet; Refill: 1  2. Stage 3b chronic kidney disease ([place])   3. Primary hypertension Chronic, fair control Continue with current medications  4. Hyperlipidemia, unspecified hyperlipidemia type  Chronic, stable  Continue with current medications, tolerating well - Lipid panel  5. Vitamin D deficiency  Will check vitamin D level and supplement as needed.     Also encouraged to spend 15 minutes in the sun daily.  - VITAMIN D 25 Hydroxy (Vit-D Deficiency, Fractures)  6. Acquired hypothyroidism Chronic, stable Continue with current medications Thyroid levels were normal in January will recheck in 3 months   Patient was given opportunity to ask questions. Patient verbalized understanding of the plan and was able to repeat key elements of the plan. All questions were answered to their satisfaction.  [name], FNP   I, [name], FNP, have reviewed all documentation for this visit. The documentation on 12/11/20 for the exam, diagnosis, procedures, and orders are all accurate and complete.   IF YOU HAVE BEEN REFERRED TO A SPECIALIST, IT MAY TAKE 1-2 WEEKS TO SCHEDULE/PROCESS THE REFERRAL. IF YOU HAVE NOT HEARD FROM US/SPECIALIST IN TWO WEEKS, PLEASE GIVE [place] A CALL AT [phone].   THE PATIENT IS ENCOURAGED TO PRACTICE SOCIAL DISTANCING DUE TO THE COVID-19 PANDEMIC.

## 2020-12-11 NOTE — Patient Instructions (Signed)
Hypertension, Adult Hypertension is another name for high blood pressure. High blood pressure forces your heart to work harder to pump blood. This can cause problems over time. There are two numbers in a blood pressure reading. There is a top number (systolic) over a bottom number (diastolic). It is best to have a blood pressure that is below 120/80. Healthy choices can help lower your blood pressure, or you may need medicine to help lower it. What are the causes? The cause of this condition is not known. Some conditions may be related to high blood pressure. What increases the risk?  Smoking.  Having type 2 diabetes mellitus, high cholesterol, or both.  Not getting enough exercise or physical activity.  Being overweight.  Having too much fat, sugar, calories, or salt (sodium) in your diet.  Drinking too much alcohol.  Having long-term (chronic) kidney disease.  Having a family history of high blood pressure.  Age. Risk increases with age.  Race. You may be at higher risk if you are African American.  Gender. Men are at higher risk than women before age 45. After age 65, women are at higher risk than men.  Having obstructive sleep apnea.  Stress. What are the signs or symptoms?  High blood pressure may not cause symptoms. Very high blood pressure (hypertensive crisis) may cause: ? Headache. ? Feelings of worry or nervousness (anxiety). ? Shortness of breath. ? Nosebleed. ? A feeling of being sick to your stomach (nausea). ? Throwing up (vomiting). ? Changes in how you see. ? Very bad chest pain. ? Seizures. How is this treated?  This condition is treated by making healthy lifestyle changes, such as: ? Eating healthy foods. ? Exercising more. ? Drinking less alcohol.  Your health care provider may prescribe medicine if lifestyle changes are not enough to get your blood pressure under control, and if: ? Your top number is above 130. ? Your bottom number is above  80.  Your personal target blood pressure may vary. Follow these instructions at home: Eating and drinking  If told, follow the DASH eating plan. To follow this plan: ? Fill one half of your plate at each meal with fruits and vegetables. ? Fill one fourth of your plate at each meal with whole grains. Whole grains include whole-wheat pasta, brown rice, and whole-grain bread. ? Eat or drink low-fat dairy products, such as skim milk or low-fat yogurt. ? Fill one fourth of your plate at each meal with low-fat (lean) proteins. Low-fat proteins include fish, chicken without skin, eggs, beans, and tofu. ? Avoid fatty meat, cured and processed meat, or chicken with skin. ? Avoid pre-made or processed food.  Eat less than 1,500 mg of salt each day.  Do not drink alcohol if: ? Your doctor tells you not to drink. ? You are pregnant, may be pregnant, or are planning to become pregnant.  If you drink alcohol: ? Limit how much you use to:  0-1 drink a day for women.  0-2 drinks a day for men. ? Be aware of how much alcohol is in your drink. In the [place] one drink equals one 12 oz bottle of beer (355 mL), one 5 oz glass of wine (148 mL), or one 1 oz glass of hard liquor (44 mL).   Lifestyle  Work with your doctor to stay at a healthy weight or to lose weight. Ask your doctor what the best weight is for you.  Get at least 30 minutes of exercise most   days of the week. This may include walking, swimming, or biking.  Get at least 30 minutes of exercise that strengthens your muscles (resistance exercise) at least 3 days a week. This may include lifting weights or doing Pilates.  Do not use any products that contain nicotine or tobacco, such as cigarettes, e-cigarettes, and chewing tobacco. If you need help quitting, ask your doctor.  Check your blood pressure at home as told by your doctor.  Keep all follow-up visits as told by your doctor. This is important.   Medicines  Take over-the-counter  and prescription medicines only as told by your doctor. Follow directions carefully.  Do not skip doses of blood pressure medicine. The medicine does not work as well if you skip doses. Skipping doses also puts you at risk for problems.  Ask your doctor about side effects or reactions to medicines that you should watch for. Contact a doctor if you:  Think you are having a reaction to the medicine you are taking.  Have headaches that keep coming back (recurring).  Feel dizzy.  Have swelling in your ankles.  Have trouble with your vision. Get help right away if you:  Get a very bad headache.  Start to feel mixed up (confused).  Feel weak or numb.  Feel faint.  Have very bad pain in your: ? Chest. ? Belly (abdomen).  Throw up more than once.  Have trouble breathing. Summary  Hypertension is another name for high blood pressure.  High blood pressure forces your heart to work harder to pump blood.  For most people, a normal blood pressure is less than 120/80.  Making healthy choices can help lower blood pressure. If your blood pressure does not get lower with healthy choices, you may need to take medicine. This information is not intended to replace advice given to you by your health care provider. Make sure you discuss any questions you have with your health care provider. Document Revised: 04/20/2018 Document Reviewed: 04/20/2018 Elsevier Patient Education  [place]. Diabetes Mellitus and Exercise Exercising regularly is important for overall health, especially for people who have diabetes mellitus. Exercising is not only about losing weight. It has many other health benefits, such as increasing muscle strength and bone density and reducing body fat and stress. This leads to improved fitness, flexibility, and endurance, all of which result in better overall health. What are the benefits of exercise if I have diabetes? Exercise has many benefits for people with  diabetes. They include:  Helping to lower and control blood sugar (glucose).  Helping the body to respond better to the hormone insulin by improving insulin sensitivity.  Reducing how much insulin the body needs.  Lowering the risk for heart disease by: ? Lowering "bad" cholesterol and triglyceride levels. ? Increasing "good" cholesterol levels. ? Lowering blood pressure. ? Lowering blood glucose levels. What is my activity plan? Your health care provider or certified diabetes educator can help you make a plan for the type and frequency of exercise that works for you. This is called your activity plan. Be sure to:  Get at least 150 minutes of medium-intensity or high-intensity exercise each week. Exercises may include brisk walking, biking, or water aerobics.  Do stretching and strengthening exercises, such as yoga or weight lifting, at least 2 times a week.  Spread out your activity over at least 3 days of the week.  Get some form of physical activity each day. ? Do not go more than 2 days in  a row without some kind of physical activity. ? Avoid being inactive for more than 90 minutes at a time. Take frequent breaks to walk or stretch.  Choose exercises or activities that you enjoy. Set realistic goals.  Start slowly and gradually increase your exercise intensity over time.   How do I manage my diabetes during exercise? Monitor your blood glucose  Check your blood glucose before and after exercising. If your blood glucose is: ? 240 mg/dL (13.3 mmol/L) or higher before you exercise, check your urine for ketones. These are chemicals created by the liver. If you have ketones in your urine, do not exercise until your blood glucose returns to normal. ? 100 mg/dL (5.6 mmol/L) or lower, eat a snack containing 15-20 grams of carbohydrate. Check your blood glucose 15 minutes after the snack to make sure that your glucose level is above 100 mg/dL (5.6 mmol/L) before you start your  exercise.  Know the symptoms of low blood glucose (hypoglycemia) and how to treat it. Your risk for hypoglycemia increases during and after exercise. Follow these tips and your health care provider's instructions  Keep a carbohydrate snack that is fast-acting for use before, during, and after exercise to help prevent or treat hypoglycemia.  Avoid injecting insulin into areas of the body that are going to be exercised. For example, avoid injecting insulin into: ? Your arms, when you are about to play tennis. ? Your legs, when you are about to go jogging.  Keep records of your exercise habits. Doing this can help you and your health care provider adjust your diabetes management plan as needed. Write down: ? Food that you eat before and after you exercise. ? Blood glucose levels before and after you exercise. ? The type and amount of exercise you have done.  Work with your health care provider when you start a new exercise or activity. He or she may need to: ? Make sure that the activity is safe for you. ? Adjust your insulin, other medicines, and food that you eat.  Drink plenty of water while you exercise. This prevents loss of water (dehydration) and problems caused by a lot of heat in the body (heat stroke).   Where to find more information  American Diabetes Association: www.diabetes.org Summary  Exercising regularly is important for overall health, especially for people who have diabetes mellitus.  Exercising has many health benefits. It increases muscle strength and bone density and reduces body fat and stress. It also lowers and controls blood glucose.  Your health care provider or certified diabetes educator can help you make an activity plan for the type and frequency of exercise that works for you.  Work with your health care provider to make sure any new activity is safe for you. Also work with your health care provider to adjust your insulin, other medicines, and the food  you eat. This information is not intended to replace advice given to you by your health care provider. Make sure you discuss any questions you have with your health care provider. Document Revised: 05/08/2019 Document Reviewed: 05/08/2019 Elsevier Patient Education  [place].

## 2020-12-12 LAB — CMP14+EGFR
ALT: 31 IU/L (ref 0–32)
AST: 20 IU/L (ref 0–40)
Albumin/Globulin Ratio: 1.2 (ref 1.2–2.2)
Albumin: 4.1 g/dL (ref 3.6–4.6)
Alkaline Phosphatase: 83 IU/L (ref 44–121)
BUN/Creatinine Ratio: 14 (ref 12–28)
BUN: 18 mg/dL (ref 8–27)
Bilirubin Total: 0.7 mg/dL (ref 0.0–1.2)
CO2: 23 mmol/L (ref 20–29)
Calcium: 10.2 mg/dL (ref 8.7–10.3)
Chloride: 100 mmol/L (ref 96–106)
Creatinine, Ser: 1.29 mg/dL — ABNORMAL HIGH (ref 0.57–1.00)
Globulin, Total: 3.3 g/dL (ref 1.5–4.5)
Glucose: 214 mg/dL — ABNORMAL HIGH (ref 65–99)
Potassium: 4.7 mmol/L (ref 3.5–5.2)
Sodium: 138 mmol/L (ref 134–144)
Total Protein: 7.4 g/dL (ref 6.0–8.5)
eGFR: 41 mL/min/{1.73_m2} — ABNORMAL LOW (ref >59)

## 2020-12-12 LAB — LIPID PANEL
Chol/HDL Ratio: 2.8 ratio (ref 0.0–4.4)
Cholesterol, Total: 120 mg/dL (ref 100–199)
HDL: 43 mg/dL (ref >39)
LDL Chol Calc (NIH): 49 mg/dL (ref 0–99)
Triglycerides: 164 mg/dL — ABNORMAL HIGH (ref 0–149)
VLDL Cholesterol Cal: 28 mg/dL (ref 5–40)

## 2020-12-12 LAB — HEMOGLOBIN A1C
Est. average glucose Bld gHb Est-mCnc: 212 mg/dL
Hgb A1c MFr Bld: 9 % — ABNORMAL HIGH (ref 4.8–5.6)

## 2020-12-12 LAB — VITAMIN D 25 HYDROXY (VIT D DEFICIENCY, FRACTURES): Vit D, 25-Hydroxy: 83.6 ng/mL (ref 30.0–100.0)

## 2020-12-23 ENCOUNTER — Other Ambulatory Visit: Payer: Self-pay | Admitting: Nurse Practitioner

## 2020-12-29 ENCOUNTER — Other Ambulatory Visit: Payer: Self-pay | Admitting: Nurse Practitioner

## 2020-12-31 ENCOUNTER — Other Ambulatory Visit: Payer: Self-pay

## 2020-12-31 ENCOUNTER — Ambulatory Visit
Admission: RE | Admit: 2020-12-31 | Discharge: 2020-12-31 | Disposition: A | Discharge status: Home / Self Care | Payer: Medicare Other | Source: Ambulatory Visit | Attending: Nurse Practitioner | Admitting: Nurse Practitioner

## 2020-12-31 DIAGNOSIS — M8589 Other specified disorders of bone density and structure, multiple sites: Secondary | ICD-10-CM | POA: Diagnosis not present

## 2020-12-31 DIAGNOSIS — Z780 Asymptomatic menopausal state: Secondary | ICD-10-CM | POA: Diagnosis not present

## 2020-12-31 DIAGNOSIS — E2839 Other primary ovarian failure: Secondary | ICD-10-CM

## 2020-12-31 MED ORDER — ACCU-CHEK GUIDE VI STRP: ORAL_STRIP | 2 refills | Status: DC

## 2021-01-07 ENCOUNTER — Other Ambulatory Visit: Payer: Self-pay | Admitting: Nurse Practitioner

## 2021-01-15 ENCOUNTER — Encounter: Payer: Self-pay | Admitting: Nurse Practitioner

## 2021-01-27 ENCOUNTER — Other Ambulatory Visit: Payer: Self-pay | Admitting: Nurse Practitioner

## 2021-01-29 ENCOUNTER — Other Ambulatory Visit: Payer: Self-pay | Admitting: Nurse Practitioner

## 2021-01-29 DIAGNOSIS — E118 Type 2 diabetes mellitus with unspecified complications: Secondary | ICD-10-CM

## 2021-01-29 DIAGNOSIS — Z794 Long term (current) use of insulin: Secondary | ICD-10-CM

## 2021-02-04 ENCOUNTER — Other Ambulatory Visit: Payer: Self-pay

## 2021-02-04 ENCOUNTER — Ambulatory Visit (INDEPENDENT_AMBULATORY_CARE_PROVIDER_SITE_OTHER): Payer: Medicare Other | Admitting: Podiatry

## 2021-02-04 DIAGNOSIS — M79675 Pain in left toe(s): Secondary | ICD-10-CM

## 2021-02-04 DIAGNOSIS — B351 Tinea unguium: Secondary | ICD-10-CM | POA: Diagnosis not present

## 2021-02-04 DIAGNOSIS — M79674 Pain in right toe(s): Secondary | ICD-10-CM

## 2021-02-04 DIAGNOSIS — E1142 Type 2 diabetes mellitus with diabetic polyneuropathy: Secondary | ICD-10-CM

## 2021-02-10 ENCOUNTER — Encounter: Payer: Self-pay | Admitting: Podiatry

## 2021-02-10 NOTE — Progress Notes (Signed)
Subjective: [name] is a pleasant 86 y.o. female patient seen today for preventative diabetic foot care. She is seen for painful thick toenails that are difficult to trim. Pain interferes with ambulation. Aggravating factors include wearing enclosed shoe gear. Pain is relieved with periodic professional debridement.  She states her blood glucose was 118 mg/dl today.  PCP is [name], FNP. Last visit was: 06/12/2020.  No Known Allergies  Objective: Physical Exam  General: [name] is a pleasant 86 y.o. African American female, in NAD. AAO x 3.   Vascular:  Capillary refill time to digits immediate b/l. Palpable pedal pulses b/l LE. Pedal hair present. Lower extremity skin temperature gradient within normal limits. No pain with calf compression b/l.  Dermatological:  Pedal skin with normal turgor, texture and tone bilaterally. No open wounds bilaterally. No interdigital macerations bilaterally. Toenails 1-5 b/l elongated, discolored, dystrophic, thickened, crumbly with subungual debris and tenderness to dorsal palpation.  Musculoskeletal:  Normal muscle strength 5/5 to all lower extremity muscle groups bilaterally. No pain crepitus or joint limitation noted with ROM b/l. Hallux valgus with bunion deformity noted b/l feet.  Neurological:  Pt has subjective symptoms of neuropathy. Protective sensation decreased with 10 gram monofilament b/l. Vibratory sensation decreased b/l.  Assessment and Plan:  1. Pain due to onychomycosis of toenails of both feet   2. Diabetic peripheral neuropathy associated with type 2 diabetes mellitus ([place])     -Examined patient. -No new findings. No new orders. -Continue diabetic foot care principles. -Patient to continue soft, supportive shoe gear daily. -Toenails 1-5 b/l were debrided in length and girth with sterile nail nippers and dremel without iatrogenic bleeding.  -Patient to report any pedal injuries to medical  professional immediately. -Patient/POA to call should there be question/concern in the interim.  Return in about 3 months (around 05/07/2021).  Marzetta Board, DPM

## 2021-02-28 ENCOUNTER — Other Ambulatory Visit: Payer: Self-pay | Admitting: Nurse Practitioner

## 2021-02-28 DIAGNOSIS — Z794 Long term (current) use of insulin: Secondary | ICD-10-CM

## 2021-02-28 DIAGNOSIS — E118 Type 2 diabetes mellitus with unspecified complications: Secondary | ICD-10-CM

## 2021-03-06 ENCOUNTER — Other Ambulatory Visit: Payer: Self-pay | Admitting: Nurse Practitioner

## 2021-03-06 DIAGNOSIS — E118 Type 2 diabetes mellitus with unspecified complications: Secondary | ICD-10-CM

## 2021-03-06 DIAGNOSIS — Z794 Long term (current) use of insulin: Secondary | ICD-10-CM

## 2021-03-17 ENCOUNTER — Encounter: Payer: Self-pay | Admitting: Nurse Practitioner

## 2021-03-17 ENCOUNTER — Other Ambulatory Visit: Payer: Self-pay

## 2021-03-17 ENCOUNTER — Other Ambulatory Visit: Payer: Self-pay | Admitting: Nurse Practitioner

## 2021-03-17 ENCOUNTER — Ambulatory Visit (INDEPENDENT_AMBULATORY_CARE_PROVIDER_SITE_OTHER): Payer: Medicare Other | Admitting: Nurse Practitioner

## 2021-03-17 VITALS — BP 124/76 | HR 81 | Temp 97.5°F | Ht 62.8 in | Wt 151.2 lb

## 2021-03-17 DIAGNOSIS — E785 Hyperlipidemia, unspecified: Secondary | ICD-10-CM

## 2021-03-17 DIAGNOSIS — Z794 Long term (current) use of insulin: Secondary | ICD-10-CM

## 2021-03-17 DIAGNOSIS — E559 Vitamin D deficiency, unspecified: Secondary | ICD-10-CM | POA: Diagnosis not present

## 2021-03-17 DIAGNOSIS — I10 Essential (primary) hypertension: Secondary | ICD-10-CM | POA: Diagnosis not present

## 2021-03-17 DIAGNOSIS — E039 Hypothyroidism, unspecified: Secondary | ICD-10-CM | POA: Diagnosis not present

## 2021-03-17 DIAGNOSIS — E118 Type 2 diabetes mellitus with unspecified complications: Secondary | ICD-10-CM | POA: Diagnosis not present

## 2021-03-17 MED ORDER — ACCU-CHEK GUIDE VI STRP: ORAL_STRIP | 2 refills | Status: DC

## 2021-03-17 MED ORDER — LANTUS SOLOSTAR 100 UNIT/ML ~~LOC~~ SOPN: 30.0000 [IU] | PEN_INJECTOR | Freq: Every day | SUBCUTANEOUS | 5 refills | Status: DC

## 2021-03-17 NOTE — Patient Instructions (Signed)

## 2021-03-17 NOTE — Progress Notes (Signed)
I,Yamilka [employer] as a Education administrator for [employer], FNP.,have documented all relevant documentation on the behalf of [name], FNP,as directed by  [name], FNP while in the presence of [name], [place].  This visit occurred during the SARS-CoV-2 public health emergency.  Safety protocols were in place, including screening questions prior to the visit, additional usage of staff PPE, and extensive cleaning of exam room while observing appropriate contact time as indicated for disinfecting solutions.  Subjective:     Patient ID: [name] , female    DOB: [birth year] , 86 y.o.   MRN: [id number]   Chief Complaint  Patient presents with   Diabetes   Hypertension    HPI  Patient here for a f/u on her dm, and blood pressure. She is walking every morning about 3 miles around [facility] Readings from Last 3 Encounters: 03/17/21 : 151 lb 3.2 oz (68.6 kg) 12/11/20 : 150 lb (68 kg) 09/12/20 : 150 lb (68 kg)    Diabetes She presents for her follow-up diabetic visit. She has type 2 diabetes mellitus. Her disease course has been stable. Hypoglycemia symptoms include dizziness (reports she has vertigo and has been taking medications and it is better. ). Pertinent negatives for hypoglycemia include no headaches. Pertinent negatives for diabetes include no fatigue, no polydipsia, no polyphagia and no polyuria. There are no hypoglycemic complications. There are no diabetic complications. Risk factors for coronary artery disease include sedentary lifestyle and hypertension. She is following a generally healthy diet. When asked about meal planning, she reported none. She has not had a previous visit with a dietitian. She rarely participates in exercise. (Blood sugars have been doing better 107-114, once was down to 98) An ACE inhibitor/angiotensin II receptor blocker is being taken. She does not see a podiatrist.Eye exam is current (11/30/2020 - eye exam).  Hypertension This is a  chronic problem. The current episode started more than 1 year ago. The problem has been gradually improving since onset. Pertinent negatives include no anxiety, headaches or shortness of breath. Risk factors for coronary artery disease include dyslipidemia. There are no compliance problems.  There is no history of angina. There is no history of chronic renal disease.  Dizziness This is a recurrent problem. The current episode started more than 1 year ago. The problem occurs intermittently. The problem has been rapidly improving. Pertinent negatives include no fatigue or headaches.    Past Medical History:  Diagnosis Date   Arthritis    Asthma    CAD (coronary artery disease)    s/p NSTEMI 10/19 - LHC:  pRCA 99, oLCx 30 >> DES to RCA; Echo 10/19: Mild focal basal septal hypertrophy, EF 60-65, inf-lat, inf and inf-sept HK, trivial MR, trivial TR   Diabetes mellitus without complication (HCC)    DKA (diabetic ketoacidoses) 08/31/2019   Hypertension      Family History  Problem Relation Age of Onset   Diabetes Mother    Heart disease Mother    Diabetes Father    Diabetes Sister    Heart disease Sister    Diabetes Brother      Current Outpatient Medications:    Accu-Chek FastClix Lancets MISC, USE AS DIRECTED FOR DAILY TESTING, Disp: 102 each, Rfl: 2   acetaminophen (TYLENOL) 325 MG tablet, Take by mouth every 6 (six) hours as needed for mild pain., Disp: , Rfl:    Cholecalciferol (VITAMIN D3) 1.25 MG (50000 UT) CAPS, TAKE 1 CAPSULE BY MOUTH EVERY FRIDAY,  Disp: 12 capsule, Rfl: 0   clopidogrel (PLAVIX) 75 MG tablet, Take 1 tablet by mouth once daily with breakfast, Disp: 90 tablet, Rfl: 2   insulin glargine (LANTUS SOLOSTAR) 100 UNIT/ML Solostar Pen, Inject 30 Units into the skin daily., Disp: 15 mL, Rfl: 5   Insulin Pen Needle (PEN NEEDLES 29GX1/2") 29G X 12MM MISC, , Disp: , Rfl:    levothyroxine (SYNTHROID) 50 MCG tablet, Take 1 tablet by mouth once daily, Disp: 90 tablet, Rfl: 0    metoprolol succinate (TOPROL-XL) 25 MG 24 hr tablet, Take 1 tablet by mouth once daily, Disp: 90 tablet, Rfl: 2   RELION PEN NEEDLE 31G/8MM 31G X 8 MM MISC, USE AS DIRECTED, Disp: 100 each, Rfl: 0   rosuvastatin (CRESTOR) 20 MG tablet, Take 1 tablet by mouth once daily, Disp: 90 tablet, Rfl: 2   TRULICITY 3 UJ/8.1XB SOPN, INEJCT 3 MG INTO THE SKIN ONCE A WEEK, Disp: 12 mL, Rfl: 0   XIIDRA 5 % SOLN, Place 2 drops into both eyes 2 (two) times daily. , Disp: , Rfl:    ACCU-CHEK GUIDE test strip, use to check blood sugars daily, Disp: 100 each, Rfl: 2   amLODipine-olmesartan (AZOR) 5-20 MG tablet, TAKE 1 TABLET BY MOUTH ONCE DAILY IN THE MORNING, Disp: 90 tablet, Rfl: 0   ANORO ELLIPTA 62.5-25 MCG/INH AEPB, Inhale 1 puff by mouth once daily, Disp: 60 each, Rfl: 0   JARDIANCE 10 MG TABS tablet, TAKE 1 TABLET BY MOUTH ONCE DAILY BEFORE BREAKFAST, Disp: 30 tablet, Rfl: 0   meclizine (ANTIVERT) 25 MG tablet, TAKE 1 TABLET BY MOUTH ONCE DAILY AS NEEDED DIZZINESS, Disp: 30 tablet, Rfl: 0   nitroGLYCERIN (NITROSTAT) 0.4 MG SL tablet, Place 1 tablet (0.4 mg total) under the tongue every 5 (five) minutes x 3 doses as needed for chest pain., Disp: 25 tablet, Rfl: 7   No Known Allergies   Review of Systems  Constitutional:  Negative for fatigue.  Respiratory:  Negative for shortness of breath.   Endocrine: Negative for polydipsia, polyphagia and polyuria.  Neurological:  Positive for dizziness (reports she has vertigo and has been taking medications and it is better. ). Negative for headaches.    Today's Vitals   03/17/21 0853  BP: 124/76  Pulse: 81  Temp: (!) 97.5 F (36.4 C)  Weight: 151 lb 3.2 oz (68.6 kg)  Height: 5' 2.8" (1.595 m)  PainSc: 0-No pain   Body mass index is 26.95 kg/m.   Objective:  Physical Exam Constitutional:      General: She is not in acute distress.    Appearance: Normal appearance. She is well-developed. She is obese.  HENT:     Head: Normocephalic and atraumatic.      Right Ear: Hearing normal.     Left Ear: Hearing normal.     Nose:     Comments: Deferred - masked    Mouth/Throat:     Comments: Deferred - masked Eyes:     General: Lids are normal.     Extraocular Movements: Extraocular movements intact.     Pupils: Pupils are equal, round, and reactive to light.     Funduscopic exam:    Right eye: No papilledema.        Left eye: No papilledema.  Neck:     Thyroid: No thyroid mass.     Vascular: No carotid bruit.  Cardiovascular:     Rate and Rhythm: Normal rate and regular rhythm.     Pulses:  Normal pulses.     Heart sounds: Normal heart sounds. No murmur heard. Pulmonary:     Effort: Pulmonary effort is normal. No respiratory distress.     Breath sounds: Normal breath sounds. No wheezing.  Chest:     Chest wall: No mass.  Breasts:    Tanner Score is 5.     Right: Normal. No mass or tenderness.     Left: Normal. No mass or tenderness.  Genitourinary:    Rectum: Guaiac result negative.  Musculoskeletal:        General: No swelling.     Cervical back: Full passive range of motion without pain.     Right lower leg: No edema.     Left lower leg: No edema.  Lymphadenopathy:     Upper Body:     Right upper body: No supraclavicular, axillary or pectoral adenopathy.     Left upper body: No supraclavicular, axillary or pectoral adenopathy.  Skin:    General: Skin is warm and dry.     Capillary Refill: Capillary refill takes less than 2 seconds.  Neurological:     General: No focal deficit present.     Mental Status: She is alert and oriented to person, place, and time.     Cranial Nerves: No cranial nerve deficit.     Sensory: No sensory deficit.  Psychiatric:        Mood and Affect: Mood normal.        Behavior: Behavior normal.        Thought Content: Thought content normal.        Judgment: Judgment normal.        Assessment And Plan:     1. Type 2 diabetes mellitus with complication, with long-term current use of insulin  (HCC) Comments: HgbA1c is slightly elevated at last visit  - Hemoglobin A1c - CMP14 + Anion Gap - insulin glargine (LANTUS SOLOSTAR) 100 UNIT/ML Solostar Pen; Inject 30 Units into the skin daily.  Dispense: 15 mL; Refill: 5  2. Primary hypertension Comments: Blood pressure is well controlled Continue current medications - CMP14 + Anion Gap  3. Hyperlipidemia, unspecified hyperlipidemia type Comments: Stable, continue statin tolerating well - Lipid panel  4. Vitamin D deficiency Will check vitamin D level and supplement as needed.    Also encouraged to spend 15 minutes in the sun daily.  - Vitamin D (25 hydroxy)  5. Acquired hypothyroidism Chronic, controlled Continue with current medications - TSH - T4 - T3, free     Patient was given opportunity to ask questions. Patient verbalized understanding of the plan and was able to repeat key elements of the plan. All questions were answered to their satisfaction.  [name], FNP   I, [name], FNP, have reviewed all documentation for this visit. The documentation on 03/17/21 for the exam, diagnosis, procedures, and orders are all accurate and complete.   IF YOU HAVE BEEN REFERRED TO A SPECIALIST, IT MAY TAKE 1-2 WEEKS TO SCHEDULE/PROCESS THE REFERRAL. IF YOU HAVE NOT HEARD FROM US/SPECIALIST IN TWO WEEKS, PLEASE GIVE [place] A CALL AT [phone].   THE PATIENT IS ENCOURAGED TO PRACTICE SOCIAL DISTANCING DUE TO THE COVID-19 PANDEMIC.

## 2021-03-18 LAB — CMP14 + ANION GAP
ALT: 18 IU/L (ref 0–32)
AST: 17 IU/L (ref 0–40)
Albumin/Globulin Ratio: 1.3 (ref 1.2–2.2)
Albumin: 4.2 g/dL (ref 3.6–4.6)
Alkaline Phosphatase: 82 IU/L (ref 44–121)
Anion Gap: 14 mmol/L (ref 10.0–18.0)
BUN/Creatinine Ratio: 9 — ABNORMAL LOW (ref 12–28)
BUN: 12 mg/dL (ref 8–27)
Bilirubin Total: 0.7 mg/dL (ref 0.0–1.2)
CO2: 25 mmol/L (ref 20–29)
Calcium: 10.4 mg/dL — ABNORMAL HIGH (ref 8.7–10.3)
Chloride: 102 mmol/L (ref 96–106)
Creatinine, Ser: 1.28 mg/dL — ABNORMAL HIGH (ref 0.57–1.00)
Globulin, Total: 3.3 g/dL (ref 1.5–4.5)
Glucose: 113 mg/dL — ABNORMAL HIGH (ref 65–99)
Potassium: 4.4 mmol/L (ref 3.5–5.2)
Sodium: 141 mmol/L (ref 134–144)
Total Protein: 7.5 g/dL (ref 6.0–8.5)
eGFR: 42 mL/min/{1.73_m2} — ABNORMAL LOW (ref >59)

## 2021-03-18 LAB — T4: T4, Total: 8.5 ug/dL (ref 4.5–12.0)

## 2021-03-18 LAB — HEMOGLOBIN A1C
Est. average glucose Bld gHb Est-mCnc: 214 mg/dL
Hgb A1c MFr Bld: 9.1 % — ABNORMAL HIGH (ref 4.8–5.6)

## 2021-03-18 LAB — LIPID PANEL
Chol/HDL Ratio: 2.6 ratio (ref 0.0–4.4)
Cholesterol, Total: 127 mg/dL (ref 100–199)
HDL: 49 mg/dL (ref >39)
LDL Chol Calc (NIH): 58 mg/dL (ref 0–99)
Triglycerides: 113 mg/dL (ref 0–149)
VLDL Cholesterol Cal: 20 mg/dL (ref 5–40)

## 2021-03-18 LAB — T3, FREE: T3, Free: 2.7 pg/mL (ref 2.0–4.4)

## 2021-03-18 LAB — TSH: TSH: 0.886 u[IU]/mL (ref 0.450–4.500)

## 2021-03-18 LAB — VITAMIN D 25 HYDROXY (VIT D DEFICIENCY, FRACTURES): Vit D, 25-Hydroxy: 101 ng/mL — ABNORMAL HIGH (ref 30.0–100.0)

## 2021-03-26 ENCOUNTER — Encounter: Payer: Self-pay | Admitting: Physician Assistant

## 2021-03-26 ENCOUNTER — Ambulatory Visit (INDEPENDENT_AMBULATORY_CARE_PROVIDER_SITE_OTHER): Payer: Medicare Other | Admitting: Physician Assistant

## 2021-03-26 ENCOUNTER — Other Ambulatory Visit: Payer: Self-pay

## 2021-03-26 VITALS — BP 124/62 | HR 82 | Ht 62.0 in | Wt 151.0 lb

## 2021-03-26 DIAGNOSIS — R0600 Dyspnea, unspecified: Secondary | ICD-10-CM

## 2021-03-26 DIAGNOSIS — I10 Essential (primary) hypertension: Secondary | ICD-10-CM

## 2021-03-26 DIAGNOSIS — E118 Type 2 diabetes mellitus with unspecified complications: Secondary | ICD-10-CM

## 2021-03-26 DIAGNOSIS — I2510 Atherosclerotic heart disease of native coronary artery without angina pectoris: Secondary | ICD-10-CM

## 2021-03-26 DIAGNOSIS — Z794 Long term (current) use of insulin: Secondary | ICD-10-CM

## 2021-03-26 DIAGNOSIS — E785 Hyperlipidemia, unspecified: Secondary | ICD-10-CM

## 2021-03-26 DIAGNOSIS — R0609 Other forms of dyspnea: Secondary | ICD-10-CM

## 2021-03-26 NOTE — Patient Instructions (Signed)
Medication Instructions:  Your physician recommends that you continue on your current medications as directed. Please refer to the Current Medication list given to you today.  *If you need a refill on your cardiac medications before your next appointment, please call your pharmacy*   Lab Work: none If you have labs (blood work) drawn today and your tests are completely normal, you will receive your results only by: [place] (if you have MyChart) OR A paper copy in the mail If you have any lab test that is abnormal or we need to change your treatment, we will call you to review the results.   Testing/Procedures: none   Follow-Up: At [facility], you and your health needs are our priority.  As part of our continuing mission to provide you with exceptional heart care, we have created designated Provider Care Teams.  These Care Teams include your primary Cardiologist (physician) and Advanced Practice Providers (APPs -  Physician Assistants and Nurse Practitioners) who all work together to provide you with the care you need, when you need it.  We recommend signing up for the patient portal called "MyChart".  Sign up information is provided on this After Visit Summary.  MyChart is used to connect with patients for Virtual Visits (Telemedicine).  Patients are able to view lab/test results, encounter notes, upcoming appointments, etc.  Non-urgent messages can be sent to your provider as well.   To learn more about what you can do with MyChart, go to [email or web address].    Your next appointment:   10/03/2021 at 8:00  The format for your next appointment:   In Person  Provider:   Sherren Mocha, MD   Other Instructions

## 2021-03-26 NOTE — Progress Notes (Signed)
Cardiology Office Note:    Date:  03/26/2021   ID:  [name], DOB 1935/06/23, MRN [id number]  PCP:  [name], [facility] Cardiologist:  Sherren Mocha, MD  [facility] Electrophysiologist:  None   Chief Complaint: CAD follow up   History of Present Illness:    [name] is a 86 y.o. female with a hx of CAD, HTN, HLD, DM, CKD, COPD and lymphoproliferative dz seen for follow up.   Hx of Coronary artery disease S/p NSTEMI 05/2018: DES to the proximal RCA Echo 05/2018: EF 60-65 with inferior hypokinesis  She was doing well when last seen by Richardson Dopp, [facility] 03/2020.  Here today for follow up.  Patient works about 3 mile 5 days/week.  Occasionally she needs to sit down to get her breath.  No exertional chest tightness or pressure which was her symptoms prior to PCI in 2019.  She denies dizziness, palpitation, orthopnea, PND, syncope, lower extremity edema or melena.  Hemoglobin A1c 9.  Complains of numbness and tingling in her feet.    Past Medical History:  Diagnosis Date   Arthritis    Asthma    CAD (coronary artery disease)    s/p NSTEMI 10/19 - LHC:  pRCA 99, oLCx 30 >> DES to RCA; Echo 10/19: Mild focal basal septal hypertrophy, EF 60-65, inf-lat, inf and inf-sept HK, trivial MR, trivial TR   Diabetes mellitus without complication ([place])    DKA (diabetic ketoacidoses) 08/31/2019   Hypertension     Past Surgical History:  Procedure Laterality Date   ABDOMINAL HYSTERECTOMY  "many yrs ago"   AXILLARY LYMPH NODE BIOPSY Right 02/26/2015   Procedure: RIGHT AXILLARY LYMPH NODE BIOPSY;  Surgeon: Jackolyn Confer, MD;  Location: [facility];  Service: General;  Laterality: Right;   CHOLECYSTECTOMY     CORONARY STENT INTERVENTION N/A 06/15/2018   Procedure: CORONARY STENT INTERVENTION;  Surgeon: Wellington Hampshire, MD;  Location: [place] [facility];  Service: Cardiovascular;  Laterality: N/A;   LEFT HEART CATH AND CORONARY ANGIOGRAPHY N/A 06/15/2018    Procedure: LEFT HEART CATH AND CORONARY ANGIOGRAPHY;  Surgeon: Wellington Hampshire, MD;  Location: [place] [facility];  Service: Cardiovascular;  Laterality: N/A;    Current Medications: Current Meds  Medication Sig   Accu-Chek FastClix Lancets MISC USE AS DIRECTED FOR DAILY TESTING   ACCU-CHEK GUIDE test strip use to check blood sugars daily   acetaminophen (TYLENOL) 325 MG tablet Take by mouth every 6 (six) hours as needed for mild pain.   amLODipine-olmesartan (AZOR) 5-20 MG tablet TAKE 1 TABLET BY MOUTH ONCE DAILY IN THE MORNING   ANORO ELLIPTA 62.5-25 MCG/INH AEPB Inhale 1 puff by mouth once daily   Cholecalciferol (VITAMIN D3) 1.25 MG (50000 UT) CAPS TAKE 1 CAPSULE BY MOUTH EVERY FRIDAY   clopidogrel (PLAVIX) 75 MG tablet Take 1 tablet by mouth once daily with breakfast   insulin glargine (LANTUS SOLOSTAR) 100 UNIT/ML Solostar Pen Inject 30 Units into the skin daily.   Insulin Pen Needle (PEN NEEDLES 29GX1/2") 29G X 12MM MISC    JARDIANCE 10 MG TABS tablet TAKE 1 TABLET BY MOUTH ONCE DAILY BEFORE BREAKFAST   levothyroxine (SYNTHROID) 50 MCG tablet Take 1 tablet by mouth once daily   meclizine (ANTIVERT) 25 MG tablet TAKE 1 TABLET BY MOUTH ONCE DAILY AS NEEDED DIZZINESS   metoprolol succinate (TOPROL-XL) 25 MG 24 hr tablet Take 1 tablet by mouth once daily   nitroGLYCERIN (NITROSTAT) 0.4 MG SL  tablet Place 1 tablet (0.4 mg total) under the tongue every 5 (five) minutes x 3 doses as needed for chest pain.   RELION PEN NEEDLE 31G/8MM 31G X 8 MM MISC USE AS DIRECTED   rosuvastatin (CRESTOR) 20 MG tablet Take 1 tablet by mouth once daily   TRULICITY 3 ZO/1.0RU SOPN INEJCT 3 MG INTO THE SKIN ONCE A WEEK   XIIDRA 5 % SOLN Place 2 drops into both eyes 2 (two) times daily.      Allergies:   Patient has no known allergies.   Social History   Socioeconomic History   Marital status: Married    Spouse name: Not on file   Number of children: Not on file   Years of education: Not on file    Highest education level: Not on file  Occupational History   Occupation: retired  Tobacco Use   Smoking status: Former    Packs/day: 0.50    Years: 60.00    Pack years: 30.00    Types: Cigarettes   Smokeless tobacco: Never  Vaping Use   Vaping Use: Never used  Substance and Sexual Activity   Alcohol use: No   Drug use: No   Sexual activity: Not on file  Other Topics Concern   Not on file  Social History Narrative   Not on file   Social Determinants of Health   Financial Resource Strain: Low Risk    Difficulty of Paying Living Expenses: Not hard at all  Food Insecurity: No Food Insecurity   Worried About Charity fundraiser in the Last Year: Never true   [place] in the Last Year: Never true  Transportation Needs: No Transportation Needs   Lack of Transportation (Medical): No   Lack of Transportation (Non-Medical): No  Physical Activity: Sufficiently Active   Days of Exercise per Week: 5 days   Minutes of Exercise per Session: 60 min  Stress: No Stress Concern Present   Feeling of Stress : Not at all  Social Connections: Not on file     Family History: The patient's family history includes Diabetes in her brother, father, mother, and sister; Heart disease in her mother and sister.    ROS:   Please see the history of present illness.    All other systems reviewed and are negative.   EKGs/Labs/Other Studies Reviewed:    The following studies were reviewed today:  CORONARY STENT INTERVENTION  05/2018  LEFT HEART CATH AND CORONARY ANGIOGRAPHY    Conclusion    Ost Cx to Prox Cx lesion is 30% stenosed. Prox RCA lesion is 99% stenosed. Post intervention, there is a 0% residual stenosis. A drug-eluting stent was successfully placed using a STENT SIERRA 2.50 X 18 MM.   1.  Severe one-vessel coronary artery disease with 99% proximal RCA stenosis which is moderately calcified and very fibrotic. 2.  Normal left ventricular end-diastolic pressure. 3.   Successful angioplasty and drug-eluting stent placement to the proximal right coronary artery.  Fully dilating the lesion was difficult and required using a noncompliant balloon to 20 atm before placing the stent.   Recommendations:   Recommend uninterrupted dual antiplatelet therapy with Aspirin $RemoveBefo'81mg'wFQjZBRTmAU$  daily and Clopidogrel $RemoveBeforeD'75mg'ngwOLRkZiHGHIR$  daily for a minimum of 12 months (ACS - Class I recommendation).    Recommend aggressive treatment of risk factors and cardiac rehab. Obtain an echocardiogram to evaluate LV systolic function.  Pigtail catheter was not advanced due to radial artery spasm.  The patient has moderate tortuosity  in the innominate artery.  Echo 05/2018 Study Conclusions   - Left ventricle: The cavity size was normal. There was mild focal    basal hypertrophy of the septum. Systolic function was normal.    The estimated ejection fraction was in the range of 60% to 65%.    Mild hypokinesis of the inferolateral, inferior, and inferoseptal    myocardium.  - Aortic valve: There was no significant regurgitation.  - Mitral valve: There was trivial regurgitation.  - Atrial septum: No defect or patent foramen ovale was identified.  - Tricuspid valve: There was trivial regurgitation.  - Pulmonic valve: There was no significant regurgitation.   Impressions:   - Overall preserved EF with mild hypokinesis of inferior,    inferseptal, and inferolateral wall.   EKG:  EKG is ordered today.  The ekg ordered today demonstrates sinus rhythm at rate of 82 bpm  Recent Labs: 04/01/2020: Hemoglobin 12.8; Platelets 246 03/17/2021: ALT 18; BUN 12; Creatinine, Ser 1.28; Potassium 4.4; Sodium 141; TSH 0.886  Recent Lipid Panel    Component Value Date/Time   CHOL 127 03/17/2021 0944   TRIG 113 03/17/2021 0944   HDL 49 03/17/2021 0944   CHOLHDL 2.6 03/17/2021 0944   CHOLHDL 2.6 06/15/2018 0542   VLDL 29 06/15/2018 0542   LDLCALC 58 03/17/2021 0944   Physical Exam:    VS:  BP 124/62   Pulse 82   Ht  $R'5\' 2"'OY$  (1.575 m)   Wt 151 lb (68.5 kg)   SpO2 95%   BMI 27.62 kg/m     Wt Readings from Last 3 Encounters:  03/26/21 151 lb (68.5 kg)  03/17/21 151 lb 3.2 oz (68.6 kg)  12/11/20 150 lb (68 kg)     GEN: Well nourished, well developed in no acute distress HEENT: Normal NECK: No JVD; No carotid bruits LYMPHATICS: No lymphadenopathy CARDIAC: RRR, no murmurs, rubs, gallops RESPIRATORY:  Clear to auscultation without rales, wheezing or rhonchi  ABDOMEN: Soft, non-tender, non-distended MUSCULOSKELETAL:  No edema; No deformity  SKIN: Warm and dry NEUROLOGIC:  Alert and oriented x 3 PSYCHIATRIC:  Normal affect   ASSESSMENT AND PLAN:    Dyspnea on exertion -This is chronic.  She walks 3 miles 4 to 5 days/week.  Needed to stop multiple times but this is stable.  She had exertional chest tightness prior to PCI in 2019.  Currently denies exertional chest discomfort.  Patient attributes her symptoms to age and underlying COPD.  We will continue to monitor her symptoms.  She will let us know if worsening symptoms.  2.  CAD -As above.  Continue Plavix, statin and beta-blocker.  3.  Hyperlipidemia -Most recent LDL 58 on July 2022. -Continue statin  4.  Uncontrolled diabetes mellitus -Most recent hemoglobin A1c greater than 9 -Managed by PCP  Medication Adjustments/Labs and Tests Ordered: Current medicines are reviewed at length with the patient today.  Concerns regarding medicines are outlined above.  No orders of the defined types were placed in this encounter.  No orders of the defined types were placed in this encounter.   There are no Patient Instructions on file for this visit.   Jarrett Soho, [place]  03/26/2021 9:51 AM    [place]

## 2021-03-31 ENCOUNTER — Other Ambulatory Visit: Payer: Self-pay | Admitting: Nurse Practitioner

## 2021-03-31 DIAGNOSIS — E118 Type 2 diabetes mellitus with unspecified complications: Secondary | ICD-10-CM

## 2021-04-01 ENCOUNTER — Other Ambulatory Visit: Payer: Self-pay | Admitting: Nurse Practitioner

## 2021-04-08 ENCOUNTER — Other Ambulatory Visit: Payer: Self-pay | Admitting: Nurse Practitioner

## 2021-04-08 DIAGNOSIS — Z1231 Encounter for screening mammogram for malignant neoplasm of breast: Secondary | ICD-10-CM

## 2021-04-13 ENCOUNTER — Other Ambulatory Visit: Payer: Self-pay | Admitting: Nurse Practitioner

## 2021-04-25 ENCOUNTER — Other Ambulatory Visit: Payer: Self-pay | Admitting: Cardiovascular Disease

## 2021-04-28 ENCOUNTER — Other Ambulatory Visit: Payer: Self-pay | Admitting: Nurse Practitioner

## 2021-04-28 DIAGNOSIS — E118 Type 2 diabetes mellitus with unspecified complications: Secondary | ICD-10-CM

## 2021-04-29 ENCOUNTER — Other Ambulatory Visit: Payer: Self-pay | Admitting: Nurse Practitioner

## 2021-05-02 ENCOUNTER — Other Ambulatory Visit: Payer: Self-pay | Admitting: Nurse Practitioner

## 2021-05-02 DIAGNOSIS — E118 Type 2 diabetes mellitus with unspecified complications: Secondary | ICD-10-CM

## 2021-05-05 ENCOUNTER — Other Ambulatory Visit: Payer: Self-pay | Admitting: Nurse Practitioner

## 2021-05-13 ENCOUNTER — Ambulatory Visit: Payer: Medicare Other | Admitting: Podiatry

## 2021-05-26 ENCOUNTER — Ambulatory Visit
Admission: RE | Admit: 2021-05-26 | Discharge: 2021-05-26 | Disposition: A | Discharge status: Home / Self Care | Payer: Medicare Other | Source: Ambulatory Visit | Attending: Nurse Practitioner | Admitting: Nurse Practitioner

## 2021-05-26 ENCOUNTER — Other Ambulatory Visit: Payer: Self-pay

## 2021-05-26 DIAGNOSIS — Z1231 Encounter for screening mammogram for malignant neoplasm of breast: Secondary | ICD-10-CM

## 2021-05-27 ENCOUNTER — Ambulatory Visit (INDEPENDENT_AMBULATORY_CARE_PROVIDER_SITE_OTHER): Payer: Medicare Other | Admitting: Podiatry

## 2021-05-27 ENCOUNTER — Encounter: Payer: Self-pay | Admitting: Podiatry

## 2021-05-27 DIAGNOSIS — E1142 Type 2 diabetes mellitus with diabetic polyneuropathy: Secondary | ICD-10-CM | POA: Diagnosis not present

## 2021-05-27 DIAGNOSIS — M79674 Pain in right toe(s): Secondary | ICD-10-CM | POA: Diagnosis not present

## 2021-05-27 DIAGNOSIS — M79675 Pain in left toe(s): Secondary | ICD-10-CM

## 2021-05-27 DIAGNOSIS — B351 Tinea unguium: Secondary | ICD-10-CM | POA: Diagnosis not present

## 2021-05-31 ENCOUNTER — Other Ambulatory Visit: Payer: Self-pay | Admitting: Nurse Practitioner

## 2021-05-31 DIAGNOSIS — E118 Type 2 diabetes mellitus with unspecified complications: Secondary | ICD-10-CM

## 2021-06-01 NOTE — Progress Notes (Signed)
  Subjective:  Patient ID: [name], female    DOB: 02/04/1935,  MRN: [id number]  Kristin Barcus presents to clinic today for at risk foot care with history of diabetic neuropathy and thick, elongated toenails b/l feet which are tender when wearing enclosed shoe gear.  Patient states blood glucose was 101 mg/dl yesterday.  Patient did not check blood glucose today.  PCP is [name], FNP , and last visit was 03/17/2021.  No Known Allergies  Review of Systems: Negative except as noted in the HPI. Objective:   Constitutional Rakia Frayne is a pleasant 86 y.o. African American female, in NAD. AAO x 3.   Vascular Capillary refill time to digits immediate b/l. Palpable DP pulse(s) b/l lower extremities Palpable PT pulse(s) b/l lower extremities Pedal hair present. Lower extremity skin temperature gradient within normal limits. No pain with calf compression b/l. No edema noted b/l lower extremities. No cyanosis or clubbing noted.  Neurologic Normal speech. Oriented to person, place, and time. Pt has subjective symptoms of neuropathy. Protective sensation decreased with 10 gram monofilament b/l. Vibratory sensation decreased b/l.  Dermatologic Skin warm and supple b/l lower extremities. No open wounds b/l lower extremities. No interdigital macerations b/l lower extremities. Toenails 1-5 b/l elongated, discolored, dystrophic, thickened, crumbly with subungual debris and tenderness to dorsal palpation.  Orthopedic: Normal muscle strength 5/5 to all lower extremity muscle groups bilaterally. Hallux valgus with bunion deformity noted b/l lower extremities.   Radiographs: None Assessment:   1. Pain due to onychomycosis of toenails of both feet   2. Diabetic peripheral neuropathy associated with type 2 diabetes mellitus ([place])    Plan:  Patient was evaluated and treated and all questions answered. Consent given for treatment as described below: -No new findings. No new  orders. -Continue diabetic foot care principles: inspect feet daily, monitor glucose as recommended by PCP and/or Endocrinologist, and follow prescribed diet per PCP, Endocrinologist and/or dietician. -Patient to continue soft, supportive shoe gear daily. -Toenails 1-5 b/l were debrided in length and girth with sterile nail nippers and dremel without iatrogenic bleeding.  -Patient to report any pedal injuries to medical professional immediately. -Patient/POA to call should there be question/concern in the interim.  Return in about 3 months (around 08/27/2021).  Marzetta Board, DPM

## 2021-06-11 DIAGNOSIS — H40023 Open angle with borderline findings, high risk, bilateral: Secondary | ICD-10-CM | POA: Diagnosis not present

## 2021-06-11 DIAGNOSIS — H2511 Age-related nuclear cataract, right eye: Secondary | ICD-10-CM | POA: Diagnosis not present

## 2021-06-11 DIAGNOSIS — E119 Type 2 diabetes mellitus without complications: Secondary | ICD-10-CM | POA: Diagnosis not present

## 2021-06-11 LAB — HM DIABETES EYE EXAM

## 2021-06-12 ENCOUNTER — Other Ambulatory Visit: Payer: Self-pay | Admitting: Nurse Practitioner

## 2021-06-12 DIAGNOSIS — E118 Type 2 diabetes mellitus with unspecified complications: Secondary | ICD-10-CM

## 2021-06-12 DIAGNOSIS — Z794 Long term (current) use of insulin: Secondary | ICD-10-CM

## 2021-06-18 ENCOUNTER — Ambulatory Visit: Payer: Self-pay

## 2021-06-18 ENCOUNTER — Encounter: Payer: Medicare Other | Admitting: Nurse Practitioner

## 2021-06-19 ENCOUNTER — Ambulatory Visit (INDEPENDENT_AMBULATORY_CARE_PROVIDER_SITE_OTHER): Payer: Medicare Other

## 2021-06-19 VITALS — Ht 62.0 in | Wt 151.0 lb

## 2021-06-19 DIAGNOSIS — Z0000 Encounter for general adult medical examination without abnormal findings: Secondary | ICD-10-CM | POA: Diagnosis not present

## 2021-06-19 NOTE — Progress Notes (Signed)
I connected with [name] today by telephone and verified that I am speaking with the correct person using two identifiers. Location patient: home Location provider: work Persons participating in the virtual visit: [name], Madeline Durand LPN.   I discussed the limitations, risks, security and privacy concerns of performing an evaluation and management service by telephone and the availability of in person appointments. I also discussed with the patient that there may be a patient responsible charge related to this service. The patient expressed understanding and verbally consented to this telephonic visit.    Interactive audio and video telecommunications were attempted between this provider and patient, however failed, due to patient having technical difficulties OR patient did not have access to video capability.  We continued and completed visit with audio only.     Vital signs may be patient reported or missing.  Subjective:   [name] is a 86 y.o. female who presents for Medicare Annual (Subsequent) preventive examination.  Review of Systems     Cardiac Risk Factors include: advanced age (>29men, >39 women);diabetes mellitus;dyslipidemia;hypertension     Objective:    Today's Vitals   06/19/21 0826  Weight: 151 lb (68.5 kg)  Height: 5\' 2"  (1.575 m)   Body mass index is 27.62 kg/m.  Advanced Directives 06/19/2021 06/12/2020 09/01/2019 08/31/2019 06/15/2018 06/15/2018 04/01/2017  Does Patient Have a Medical Advance Directive? No No No No No No Yes  Would patient like information on creating a medical advance directive? - No - Patient declined Yes (ED - Information included in AVS) Yes (ED - Information included in AVS) No - Patient declined - -    Current Medications (verified) Outpatient Encounter Medications as of 06/19/2021  Medication Sig   Accu-Chek FastClix Lancets MISC USE AS DIRECTED FOR  DAILY  TESTING   ACCU-CHEK GUIDE test strip use to check blood  sugars daily   acetaminophen (TYLENOL) 325 MG tablet Take by mouth every 6 (six) hours as needed for mild pain.   amLODipine-olmesartan (AZOR) 5-20 MG tablet TAKE 1 TABLET BY MOUTH ONCE DAILY IN THE MORNING   ANORO ELLIPTA 62.5-25 MCG/ACT AEPB Inhale 1 puff by mouth once daily   Cholecalciferol (VITAMIN D3) 1.25 MG (50000 UT) CAPS TAKE 1 CAPSULE BY MOUTH EVERY FRIDAY   clopidogrel (PLAVIX) 75 MG tablet Take 1 tablet by mouth once daily with breakfast   EUTHYROX 50 MCG tablet Take 1 tablet by mouth once daily   Insulin Pen Needle (PEN NEEDLES 29GX1/2") 29G X 12MM MISC    JARDIANCE 10 MG TABS tablet TAKE 1 TABLET BY MOUTH ONCE DAILY BEFORE BREAKFAST   LANTUS SOLOSTAR 100 UNIT/ML Solostar Pen INJECT 30 UNITS SUBCUTANEOUSLY ONCE DAILY   meclizine (ANTIVERT) 25 MG tablet TAKE 1 TABLET BY MOUTH ONCE DAILY AS NEEDED FOR DIZZINESS   metoprolol succinate (TOPROL-XL) 25 MG 24 hr tablet Take 1 tablet by mouth once daily   nitroGLYCERIN (NITROSTAT) 0.4 MG SL tablet Place 1 tablet (0.4 mg total) under the tongue every 5 (five) minutes x 3 doses as needed for chest pain.   RELION PEN NEEDLE 31G/8MM 31G X 8 MM MISC USE AS DIRECTED   rosuvastatin (CRESTOR) 20 MG tablet Take 1 tablet by mouth once daily   TRULICITY 3 DJ/4.9FW SOPN INEJCT 3 MG INTO THE SKIN ONCE A WEEK   XIIDRA 5 % SOLN Place 2 drops into both eyes 2 (two) times daily.    No facility-administered encounter medications on file as of 06/19/2021.    Allergies (  verified) Patient has no known allergies.   History: Past Medical History:  Diagnosis Date   Arthritis    Asthma    CAD (coronary artery disease)    s/p NSTEMI 10/19 - LHC:  pRCA 99, oLCx 30 >> DES to RCA; Echo 10/19: Mild focal basal septal hypertrophy, EF 60-65, inf-lat, inf and inf-sept HK, trivial MR, trivial TR   Diabetes mellitus without complication ([place])    DKA (diabetic ketoacidoses) 08/31/2019   Hypertension    Past Surgical History:  Procedure Laterality Date    ABDOMINAL HYSTERECTOMY  "many yrs ago"   AXILLARY LYMPH NODE BIOPSY Right 02/26/2015   Procedure: RIGHT AXILLARY LYMPH NODE BIOPSY;  Surgeon: Jackolyn Confer, MD;  Location: [facility];  Service: General;  Laterality: Right;   CHOLECYSTECTOMY     CORONARY STENT INTERVENTION N/A 06/15/2018   Procedure: CORONARY STENT INTERVENTION;  Surgeon: Wellington Hampshire, MD;  Location: [facility];  Service: Cardiovascular;  Laterality: N/A;   LEFT HEART CATH AND CORONARY ANGIOGRAPHY N/A 06/15/2018   Procedure: LEFT HEART CATH AND CORONARY ANGIOGRAPHY;  Surgeon: Wellington Hampshire, MD;  Location: [facility];  Service: Cardiovascular;  Laterality: N/A;   Family History  Problem Relation Age of Onset   Diabetes Mother    Heart disease Mother    Diabetes Father    Diabetes Sister    Heart disease Sister    Diabetes Brother    Social History   Socioeconomic History   Marital status: Married    Spouse name: Not on file   Number of children: Not on file   Years of education: Not on file   Highest education level: Not on file  Occupational History   Occupation: retired  Tobacco Use   Smoking status: Former    Packs/day: 0.50    Years: 60.00    Pack years: 30.00    Types: Cigarettes   Smokeless tobacco: Never  Vaping Use   Vaping Use: Never used  Substance and Sexual Activity   Alcohol use: No   Drug use: No   Sexual activity: Not Currently  Other Topics Concern   Not on file  Social History Narrative   Not on file   Social Determinants of Health   Financial Resource Strain: Low Risk    Difficulty of Paying Living Expenses: Not hard at all  Food Insecurity: No Food Insecurity   Worried About Charity fundraiser in the Last Year: Never true   [place] in the Last Year: Never true  Transportation Needs: No Transportation Needs   Lack of Transportation (Medical): No   Lack of Transportation (Non-Medical): No  Physical Activity: Sufficiently Active   Days of Exercise  per Week: 3 days   Minutes of Exercise per Session: 60 min  Stress: No Stress Concern Present   Feeling of Stress : Not at all  Social Connections: Not on file    Tobacco Counseling Counseling given: Not Answered   Clinical Intake:  Pre-visit preparation completed: Yes  Pain : No/denies pain     Nutritional Status: BMI 25 -29 Overweight Nutritional Risks: None Diabetes: Yes  How often do you need to have someone help you when you read instructions, pamphlets, or other written materials from your doctor or pharmacy?: 1 - Never What is the last grade level you completed in school?: 11th grade  Diabetic? Yes Nutrition Risk Assessment:  Has the patient had any N/V/D within the last 2 months?  No  Does the patient have any non-healing wounds?  No  Has the patient had any unintentional weight loss or weight gain?  No   Diabetes:  Is the patient diabetic?  Yes  If diabetic, was a CBG obtained today?  No  Did the patient bring in their glucometer from home?  No  How often do you monitor your CBG's? daily.   Financial Strains and Diabetes Management:  Are you having any financial strains with the device, your supplies or your medication? No .  Does the patient want to be seen by Chronic Care Management for management of their diabetes?  No  Would the patient like to be referred to a Nutritionist or for Diabetic Management?  No   Diabetic Exams:  Diabetic Eye Exam: Completed 12/10/2020 Diabetic Foot Exam: Completed 07/12/2020   Interpreter Needed?: No  Information entered by :: NAllen LPN   Activities of Daily Living In your present state of health, do you have any difficulty performing the following activities: 06/19/2021  Hearing? N  Vision? N  Difficulty concentrating or making decisions? N  Walking or climbing stairs? N  Dressing or bathing? N  Doing errands, shopping? N  Preparing Food and eating ? N  Using the Toilet? N  In the past six months, have you  accidently leaked urine? N  Do you have problems with loss of bowel control? N  Managing your Medications? N  Managing your Finances? N  Housekeeping or managing your Housekeeping? N  Some recent data might be hidden    Patient Care Team: [name], FNP as PCP - General ([place]) Sherren Mocha, MD as PCP - Cardiology (Cardiology) Jackolyn Confer, MD as Consulting Physician (General Surgery) Truitt Merle, MD as Consulting Physician (Hematology)  Indicate any recent Medical Services you may have received from other than [facility] providers in the past year (date may be approximate).     Assessment:   This is a routine wellness examination for [place].  Hearing/Vision screen Vision Screening - Comments:: Regular eye exams, Dr. Venetia Maxon  Dietary issues and exercise activities discussed: Current Exercise Habits: Home exercise routine, Type of exercise: walking, Time (Minutes): 60, Frequency (Times/Week): 3, Weekly Exercise (Minutes/Week): 180   Goals Addressed             This Visit's Progress    Patient Stated       06/19/2021, no goals       Depression Screen PHQ 2/9 Scores 06/19/2021 06/12/2020 12/11/2019 09/07/2018  PHQ - 2 Score 0 0 0 0    Fall Risk Fall Risk  06/19/2021 06/12/2020 12/11/2019 08/30/2018  Falls in the past year? 0 1 1 0  Comment - fell out of the shower, due to vertigo - -  Number falls in past yr: - 1 1 -  Injury with Fall? - 0 0 -  Risk for fall due to : Medication side effect Medication side effect;History of fall(s) - Other (Comment)  Risk for fall due to: Comment - - - Dizzines, vertigo  Follow up Falls evaluation completed;Education provided;Falls prevention discussed Falls evaluation completed;Education provided;Falls prevention discussed - Falls evaluation completed    FALL RISK PREVENTION PERTAINING TO THE HOME:  Any stairs in or around the home? Yes  If so, are there any without handrails? No  Home free of loose throw rugs in  walkways, pet beds, electrical cords, etc? Yes  Adequate lighting in your home to reduce risk of falls? Yes   ASSISTIVE DEVICES UTILIZED TO PREVENT FALLS:  Life alert? No  Use of a cane, walker or w/c? No  Grab bars in the bathroom? Yes  Shower chair or bench in shower? No  Elevated toilet seat or a handicapped toilet? Yes   TIMED UP AND GO:  Was the test performed? No .      Cognitive Function:     6CIT Screen 06/19/2021 06/12/2020  What Year? 0 points 0 points  What month? 0 points 0 points  What time? 0 points 0 points  Count back from 20 0 points 0 points  Months in reverse 0 points 0 points  Repeat phrase 4 points 2 points  Total Score 4 2    Immunizations Immunization History  Administered Date(s) Administered   Influenza-Unspecified 06/24/2018, 04/22/2020   PFIZER Comirnaty(Lange Top)Covid-19 Tri-Sucrose Vaccine 12/11/2020   PFIZER(Purple Top)SARS-COV-2 Vaccination 10/17/2019, 11/07/2019, 06/03/2020   PNEUMOCOCCAL CONJUGATE-20 11/14/2020   Pneumococcal Conjugate-13 04/11/2014   Pneumococcal Polysaccharide-23 05/16/2015   Tdap 09/13/2020   Zoster Recombinat (Shingrix) 05/04/2017, 07/04/2017   Zoster, Live 04/11/2014    TDAP status: Up to date  Flu Vaccine status: Up to date  Pneumococcal vaccine status: Up to date  Covid-19 vaccine status: Completed vaccines  Qualifies for Shingles Vaccine? Yes   Zostavax completed Yes   Shingrix Completed?: Yes  Screening Tests Health Maintenance  Topic Date Due   COVID-19 Vaccine (5 - Booster for Pfizer series) 02/05/2021   INFLUENZA VACCINE  03/24/2021   FOOT EXAM  07/12/2021   HEMOGLOBIN A1C  09/17/2021   OPHTHALMOLOGY EXAM  12/10/2021   TETANUS/TDAP  09/13/2030   Pneumonia Vaccine 31+ Years old  Completed   DEXA SCAN  Completed   Zoster Vaccines- Shingrix  Completed   HPV VACCINES  Aged Out    Health Maintenance  Health Maintenance Due  Topic Date Due   COVID-19 Vaccine (5 - Booster for Pfizer  series) 02/05/2021   INFLUENZA VACCINE  03/24/2021    Colorectal cancer screening: No longer required.   Mammogram status: Completed 05/26/2021. Repeat every year  Bone Density status: Completed 12/31/2020.   Lung Cancer Screening: (Low Dose CT Chest recommended if Age 58-80 years, 30 pack-year currently smoking OR have quit w/in 15years.) does not qualify.   Lung Cancer Screening Referral: no  Additional Screening:  Hepatitis C Screening: does not qualify;   Vision Screening: Recommended annual ophthalmology exams for early detection of glaucoma and other disorders of the eye. Is the patient up to date with their annual eye exam?  Yes  Who is the provider or what is the name of the office in which the patient attends annual eye exams? Dr. Venetia Maxon If pt is not established with a provider, would they like to be referred to a provider to establish care? No .   Dental Screening: Recommended annual dental exams for proper oral hygiene  Community Resource Referral / Chronic Care Management: CRR required this visit?  No   CCM required this visit?  No      Plan:     I have personally reviewed and noted the following in the patient's chart:   Medical and social history Use of alcohol, tobacco or illicit drugs  Current medications and supplements including opioid prescriptions.  Functional ability and status Nutritional status Physical activity Advanced directives List of other physicians Hospitalizations, surgeries, and ER visits in previous 12 months Vitals Screenings to include cognitive, depression, and falls Referrals and appointments  In addition, I have reviewed and discussed with patient certain preventive protocols, quality metrics,  and best practice recommendations. A written personalized care plan for preventive services as well as general preventive health recommendations were provided to patient.     Kellie Simmering, LPN   [id number]   Nurse Notes:

## 2021-06-19 NOTE — Patient Instructions (Signed)
[name] , Thank you for taking time to come for your Medicare Wellness Visit. I appreciate your ongoing commitment to your health goals. Please review the following plan we discussed and let me know if I can assist you in the future.   Screening recommendations/referrals: Colonoscopy: not required Mammogram: completed 05/26/2021 Bone Density: completed 12/31/2020 Recommended yearly ophthalmology/optometry visit for glaucoma screening and checkup Recommended yearly dental visit for hygiene and checkup  Vaccinations: Influenza vaccine: completed Pneumococcal vaccine:  completed 11/14/2020 Tdap vaccine: completed 09/13/2020, due 09/13/2030 Shingles vaccine: completed   Covid-19: 12/11/2020, 06/03/2020, 11/07/2019, 10/17/2019  Advanced directives: Advance directive discussed with you today.   Conditions/risks identified: none  Next appointment: Follow up in one year for your annual wellness visit    Preventive Care 65 Years and Older, Female Preventive care refers to lifestyle choices and visits with your health care provider that can promote health and wellness. What does preventive care include? A yearly physical exam. This is also called an annual well check. Dental exams once or twice a year. Routine eye exams. Ask your health care provider how often you should have your eyes checked. Personal lifestyle choices, including: Daily care of your teeth and gums. Regular physical activity. Eating a healthy diet. Avoiding tobacco and drug use. Limiting alcohol use. Practicing safe sex. Taking low-dose aspirin every day. Taking vitamin and mineral supplements as recommended by your health care provider. What happens during an annual well check? The services and screenings done by your health care provider during your annual well check will depend on your age, overall health, lifestyle risk factors, and family history of disease. Counseling  Your health care provider may ask you questions  about your: Alcohol use. Tobacco use. Drug use. Emotional well-being. Home and relationship well-being. Sexual activity. Eating habits. History of falls. Memory and ability to understand (cognition). Work and work Statistician. Reproductive health. Screening  You may have the following tests or measurements: Height, weight, and BMI. Blood pressure. Lipid and cholesterol levels. These may be checked every 5 years, or more frequently if you are over 42 years old. Skin check. Lung cancer screening. You may have this screening every year starting at age 82 if you have a 30-pack-year history of smoking and currently smoke or have quit within the past 15 years. Fecal occult blood test (FOBT) of the stool. You may have this test every year starting at age 60. Flexible sigmoidoscopy or colonoscopy. You may have a sigmoidoscopy every 5 years or a colonoscopy every 10 years starting at age 61. Hepatitis C blood test. Hepatitis B blood test. Sexually transmitted disease (STD) testing. Diabetes screening. This is done by checking your blood sugar (glucose) after you have not eaten for a while (fasting). You may have this done every 1-3 years. Bone density scan. This is done to screen for osteoporosis. You may have this done starting at age 47. Mammogram. This may be done every 1-2 years. Talk to your health care provider about how often you should have regular mammograms. Talk with your health care provider about your test results, treatment options, and if necessary, the need for more tests. Vaccines  Your health care provider may recommend certain vaccines, such as: Influenza vaccine. This is recommended every year. Tetanus, diphtheria, and acellular pertussis (Tdap, Td) vaccine. You may need a Td booster every 10 years. Zoster vaccine. You may need this after age 75. Pneumococcal 13-valent conjugate (PCV13) vaccine. One dose is recommended after age 74. Pneumococcal polysaccharide (PPSV23)  vaccine.  One dose is recommended after age 57. Talk to your health care provider about which screenings and vaccines you need and how often you need them. This information is not intended to replace advice given to you by your health care provider. Make sure you discuss any questions you have with your health care provider. Document Released: 09/06/2015 Document Revised: 04/29/2016 Document Reviewed: 06/11/2015 Elsevier Interactive Patient Education  2017 [employer] Prevention in the Home Falls can cause injuries. They can happen to people of all ages. There are many things you can do to make your home safe and to help prevent falls. What can I do on the outside of my home? Regularly fix the edges of walkways and driveways and fix any cracks. Remove anything that might make you trip as you walk through a door, such as a raised step or threshold. Trim any bushes or trees on the path to your home. Use bright outdoor lighting. Clear any walking paths of anything that might make someone trip, such as rocks or tools. Regularly check to see if handrails are loose or broken. Make sure that both sides of any steps have handrails. Any raised decks and porches should have guardrails on the edges. Have any leaves, snow, or ice cleared regularly. Use sand or salt on walking paths during winter. Clean up any spills in your garage right away. This includes oil or grease spills. What can I do in the bathroom? Use night lights. Install grab bars by the toilet and in the tub and shower. Do not use towel bars as grab bars. Use non-skid mats or decals in the tub or shower. If you need to sit down in the shower, use a plastic, non-slip stool. Keep the floor dry. Clean up any water that spills on the floor as soon as it happens. Remove soap buildup in the tub or shower regularly. Attach bath mats securely with double-sided non-slip rug tape. Do not have throw rugs and other things on the floor that  can make you trip. What can I do in the bedroom? Use night lights. Make sure that you have a light by your bed that is easy to reach. Do not use any sheets or blankets that are too big for your bed. They should not hang down onto the floor. Have a firm chair that has side arms. You can use this for support while you get dressed. Do not have throw rugs and other things on the floor that can make you trip. What can I do in the kitchen? Clean up any spills right away. Avoid walking on wet floors. Keep items that you use a lot in easy-to-reach places. If you need to reach something above you, use a strong step stool that has a grab bar. Keep electrical cords out of the way. Do not use floor polish or wax that makes floors slippery. If you must use wax, use non-skid floor wax. Do not have throw rugs and other things on the floor that can make you trip. What can I do with my stairs? Do not leave any items on the stairs. Make sure that there are handrails on both sides of the stairs and use them. Fix handrails that are broken or loose. Make sure that handrails are as long as the stairways. Check any carpeting to make sure that it is firmly attached to the stairs. Fix any carpet that is loose or worn. Avoid having throw rugs at the top or bottom of the  stairs. If you do have throw rugs, attach them to the floor with carpet tape. Make sure that you have a light switch at the top of the stairs and the bottom of the stairs. If you do not have them, ask someone to add them for you. What else can I do to help prevent falls? Wear shoes that: Do not have high heels. Have rubber bottoms. Are comfortable and fit you well. Are closed at the toe. Do not wear sandals. If you use a stepladder: Make sure that it is fully opened. Do not climb a closed stepladder. Make sure that both sides of the stepladder are locked into place. Ask someone to hold it for you, if possible. Clearly mark and make sure that you  can see: Any grab bars or handrails. First and last steps. Where the edge of each step is. Use tools that help you move around (mobility aids) if they are needed. These include: Canes. Walkers. Scooters. Crutches. Turn on the lights when you go into a dark area. Replace any light bulbs as soon as they burn out. Set up your furniture so you have a clear path. Avoid moving your furniture around. If any of your floors are uneven, fix them. If there are any pets around you, be aware of where they are. Review your medicines with your doctor. Some medicines can make you feel dizzy. This can increase your chance of falling. Ask your doctor what other things that you can do to help prevent falls. This information is not intended to replace advice given to you by your health care provider. Make sure you discuss any questions you have with your health care provider. Document Released: 06/06/2009 Document Revised: 01/16/2016 Document Reviewed: 09/14/2014 Elsevier Interactive Patient Education  2017 [employer].

## 2021-06-25 ENCOUNTER — Other Ambulatory Visit: Payer: Self-pay

## 2021-06-25 ENCOUNTER — Ambulatory Visit (INDEPENDENT_AMBULATORY_CARE_PROVIDER_SITE_OTHER): Payer: Medicare Other | Admitting: Nurse Practitioner

## 2021-06-25 ENCOUNTER — Encounter: Payer: Self-pay | Admitting: Nurse Practitioner

## 2021-06-25 VITALS — BP 122/70 | HR 74 | Temp 97.7°F | Ht 62.0 in | Wt 150.0 lb

## 2021-06-25 DIAGNOSIS — R12 Heartburn: Secondary | ICD-10-CM

## 2021-06-25 DIAGNOSIS — E1165 Type 2 diabetes mellitus with hyperglycemia: Secondary | ICD-10-CM

## 2021-06-25 DIAGNOSIS — Z79899 Other long term (current) drug therapy: Secondary | ICD-10-CM | POA: Diagnosis not present

## 2021-06-25 DIAGNOSIS — E785 Hyperlipidemia, unspecified: Secondary | ICD-10-CM

## 2021-06-25 DIAGNOSIS — E039 Hypothyroidism, unspecified: Secondary | ICD-10-CM | POA: Diagnosis not present

## 2021-06-25 DIAGNOSIS — E1169 Type 2 diabetes mellitus with other specified complication: Secondary | ICD-10-CM

## 2021-06-25 DIAGNOSIS — Z794 Long term (current) use of insulin: Secondary | ICD-10-CM

## 2021-06-25 DIAGNOSIS — E118 Type 2 diabetes mellitus with unspecified complications: Secondary | ICD-10-CM | POA: Diagnosis not present

## 2021-06-25 DIAGNOSIS — Z1159 Encounter for screening for other viral diseases: Secondary | ICD-10-CM

## 2021-06-25 DIAGNOSIS — Z0000 Encounter for general adult medical examination without abnormal findings: Secondary | ICD-10-CM

## 2021-06-25 DIAGNOSIS — Z0001 Encounter for general adult medical examination with abnormal findings: Secondary | ICD-10-CM

## 2021-06-25 DIAGNOSIS — E559 Vitamin D deficiency, unspecified: Secondary | ICD-10-CM | POA: Diagnosis not present

## 2021-06-25 DIAGNOSIS — I10 Essential (primary) hypertension: Secondary | ICD-10-CM | POA: Diagnosis not present

## 2021-06-25 DIAGNOSIS — Z23 Encounter for immunization: Secondary | ICD-10-CM

## 2021-06-25 LAB — POCT URINALYSIS DIPSTICK
Bilirubin, UA: NEGATIVE
Blood, UA: NEGATIVE
Glucose, UA: POSITIVE — AB
Ketones, UA: NEGATIVE
Leukocytes, UA: NEGATIVE
Nitrite, UA: NEGATIVE
Protein, UA: NEGATIVE
Spec Grav, UA: <=1.005 — AB (ref 1.010–1.025)
Urobilinogen, UA: 0.2 E.U./dL
pH, UA: 5.5 (ref 5.0–8.0)

## 2021-06-25 LAB — POCT UA - MICROALBUMIN
Albumin/Creatinine Ratio, Urine, POC: 30
Creatinine, POC: 100 mg/dL
Microalbumin Ur, POC: 10 mg/L

## 2021-06-25 MED ORDER — EMPAGLIFLOZIN 10 MG PO TABS: 10.0000 mg | ORAL_TABLET | Freq: Every day | ORAL | 1 refills | Status: DC

## 2021-06-25 NOTE — Patient Instructions (Addendum)
Health Maintenance, Female Adopting a healthy lifestyle and getting preventive care are important in promoting health and wellness. Ask your health care provider about: The right schedule for you to have regular tests and exams. Things you can do on your own to prevent diseases and keep yourself healthy. What should I know about diet, weight, and exercise? Eat a healthy diet  Eat a diet that includes plenty of vegetables, fruits, low-fat dairy products, and lean protein. Do not eat a lot of foods that are high in solid fats, added sugars, or sodium. Maintain a healthy weight Body mass index (BMI) is used to identify weight problems. It estimates body fat based on height and weight. Your health care provider can help determine your BMI and help you achieve or maintain a healthy weight. Get regular exercise Get regular exercise. This is one of the most important things you can do for your health. Most adults should: Exercise for at least 150 minutes each week. The exercise should increase your heart rate and make you sweat (moderate-intensity exercise). Do strengthening exercises at least twice a week. This is in addition to the moderate-intensity exercise. Spend less time sitting. Even light physical activity can be beneficial. Watch cholesterol and blood lipids Have your blood tested for lipids and cholesterol at 84 years of age, then have this test every 5 years. Have your cholesterol levels checked more often if: Your lipid or cholesterol levels are high. You are older than 84 years of age. You are at high risk for heart disease. What should I know about cancer screening? Depending on your health history and family history, you may need to have cancer screening at various ages. This may include screening for: Breast cancer. Cervical cancer. Colorectal cancer. Skin cancer. Lung cancer. What should I know about heart disease, diabetes, and high blood pressure? Blood pressure and heart  disease High blood pressure causes heart disease and increases the risk of stroke. This is more likely to develop in people who have high blood pressure readings, are of African descent, or are overweight. Have your blood pressure checked: Every 3-5 years if you are 18-39 years of age. Every year if you are 40 years old or older. Diabetes Have regular diabetes screenings. This checks your fasting blood sugar level. Have the screening done: Once every three years after age 40 if you are at a normal weight and have a low risk for diabetes. More often and at a younger age if you are overweight or have a high risk for diabetes. What should I know about preventing infection? Hepatitis B If you have a higher risk for hepatitis B, you should be screened for this virus. Talk with your health care provider to find out if you are at risk for hepatitis B infection. Hepatitis C Testing is recommended for: Everyone born from 1945 through 1965. Anyone with known risk factors for hepatitis C. Sexually transmitted infections (STIs) Get screened for STIs, including gonorrhea and chlamydia, if: You are sexually active and are younger than 84 years of age. You are older than 84 years of age and your health care provider tells you that you are at risk for this type of infection. Your sexual activity has changed since you were last screened, and you are at increased risk for chlamydia or gonorrhea. Ask your health care provider if you are at risk. Ask your health care provider about whether you are at high risk for HIV. Your health care provider may recommend a prescription medicine   to help prevent HIV infection. If you choose to take medicine to prevent HIV, you should first get tested for HIV. You should then be tested every 3 months for as long as you are taking the medicine. Pregnancy If you are about to stop having your period (premenopausal) and you may become pregnant, seek counseling before you get  pregnant. Take 400 to 800 micrograms (mcg) of folic acid every day if you become pregnant. Ask for birth control (contraception) if you want to prevent pregnancy. Osteoporosis and menopause Osteoporosis is a disease in which the bones lose minerals and strength with aging. This can result in bone fractures. If you are 22 years old or older, or if you are at risk for osteoporosis and fractures, ask your health care provider if you should: Be screened for bone loss. Take a calcium or vitamin D supplement to lower your risk of fractures. Be given hormone replacement therapy (HRT) to treat symptoms of menopause. Follow these instructions at home: Lifestyle Do not use any products that contain nicotine or tobacco, such as cigarettes, e-cigarettes, and chewing tobacco. If you need help quitting, ask your health care provider. Do not use street drugs. Do not share needles. Ask your health care provider for help if you need support or information about quitting drugs. Alcohol use Do not drink alcohol if: Your health care provider tells you not to drink. You are pregnant, may be pregnant, or are planning to become pregnant. If you drink alcohol: Limit how much you use to 0-1 drink a day. Limit intake if you are breastfeeding. Be aware of how much alcohol is in your drink. In the [place] one drink equals one 12 oz bottle of beer (355 mL), one 5 oz glass of wine (148 mL), or one 1 oz glass of hard liquor (44 mL). General instructions Schedule regular health, dental, and eye exams. Stay current with your vaccines. Tell your health care provider if: You often feel depressed. You have ever been abused or do not feel safe at home. Summary Adopting a healthy lifestyle and getting preventive care are important in promoting health and wellness. Follow your health care provider's instructions about healthy diet, exercising, and getting tested or screened for diseases. Follow your health care provider's  instructions on monitoring your cholesterol and blood pressure. This information is not intended to replace advice given to you by your health care provider. Make sure you discuss any questions you have with your health care provider. Document Revised: 10/18/2020 Document Reviewed: 08/03/2018 Elsevier Patient Education  2022 [employer].  Gastroesophageal Reflux Disease, Adult Gastroesophageal reflux (GER) happens when acid from the stomach flows up into the tube that connects the mouth and the stomach (esophagus). Normally, food travels down the esophagus and stays in the stomach to be digested. With GER, food and stomach acid sometimes move back up into the esophagus. You may have a disease called gastroesophageal reflux disease (GERD) if the reflux: Happens often. Causes frequent or very bad symptoms. Causes problems such as damage to the esophagus. When this happens, the esophagus becomes sore and swollen. Over time, GERD can make small holes (ulcers) in the lining of the esophagus. What are the causes? This condition is caused by a problem with the muscle between the esophagus and the stomach. When this muscle is weak or not normal, it does not close properly to keep food and acid from coming back up from the stomach. The muscle can be weak because of: Tobacco use. Pregnancy. Having  a certain type of hernia (hiatal hernia). Alcohol use. Certain foods and drinks, such as coffee, chocolate, onions, and peppermint. What increases the risk? Being overweight. Having a disease that affects your connective tissue. Taking NSAIDs, such a ibuprofen. What are the signs or symptoms? Heartburn. Difficult or painful swallowing. The feeling of having a lump in the throat. A bitter taste in the mouth. Bad breath. Having a lot of saliva. Having an upset or bloated stomach. Burping. Chest pain. Different conditions can cause chest pain. Make sure you see your doctor if you have chest  pain. Shortness of breath or wheezing. A long-term cough or a cough at night. Wearing away of the surface of teeth (tooth enamel). Weight loss. How is this treated? Making changes to your diet. Taking medicine. Having surgery. Treatment will depend on how bad your symptoms are. Follow these instructions at home: Eating and drinking  Follow a diet as told by your doctor. You may need to avoid foods and drinks such as: Coffee and tea, with or without caffeine. Drinks that contain alcohol. Energy drinks and sports drinks. Bubbly (carbonated) drinks or sodas. Chocolate and cocoa. Peppermint and mint flavorings. Garlic and onions. Horseradish. Spicy and acidic foods. These include peppers, chili powder, curry powder, vinegar, hot sauces, and BBQ sauce. Citrus fruit juices and citrus fruits, such as oranges, lemons, and limes. Tomato-based foods. These include red sauce, chili, salsa, and pizza with red sauce. Fried and fatty foods. These include donuts, french fries, potato chips, and high-fat dressings. High-fat meats. These include hot dogs, rib eye steak, sausage, ham, and bacon. High-fat dairy items, such as whole milk, butter, and cream cheese. Eat small meals often. Avoid eating large meals. Avoid drinking large amounts of liquid with your meals. Avoid eating meals during the 2-3 hours before bedtime. Avoid lying down right after you eat. Do not exercise right after you eat. Lifestyle  Do not smoke or use any products that contain nicotine or tobacco. If you need help quitting, ask your doctor. Try to lower your stress. If you need help doing this, ask your doctor. If you are overweight, lose an amount of weight that is healthy for you. Ask your doctor about a safe weight loss goal. General instructions Pay attention to any changes in your symptoms. Take over-the-counter and prescription medicines only as told by your doctor. Do not take aspirin, ibuprofen, or other NSAIDs  unless your doctor says it is okay. Wear loose clothes. Do not wear anything tight around your waist. Raise (elevate) the head of your bed about 6 inches (15 cm). You may need to use a wedge to do this. Avoid bending over if this makes your symptoms worse. Keep all follow-up visits. Contact a doctor if: You have new symptoms. You lose weight and you do not know why. You have trouble swallowing or it hurts to swallow. You have wheezing or a cough that keeps happening. You have a hoarse voice. Your symptoms do not get better with treatment. Get help right away if: You have sudden pain in your arms, neck, jaw, teeth, or back. You suddenly feel sweaty, dizzy, or light-headed. You have chest pain or shortness of breath. You vomit and the vomit is green, yellow, or black, or it looks like blood or coffee grounds. You faint. Your poop (stool) is red, bloody, or black. You cannot swallow, drink, or eat. These symptoms may represent a serious problem that is an emergency. Do not wait to see if the symptoms will  go away. Get medical help right away. Call your local emergency services (911 in the [place] Do not drive yourself to the hospital. Summary If a person has gastroesophageal reflux disease (GERD), food and stomach acid move back up into the esophagus and cause symptoms or problems such as damage to the esophagus. Treatment will depend on how bad your symptoms are. Follow a diet as told by your doctor. Take all medicines only as told by your doctor. This information is not intended to replace advice given to you by your health care provider. Make sure you discuss any questions you have with your health care provider. Document Revised: 02/19/2020 Document Reviewed: 02/19/2020 Elsevier Patient Education  [place].

## 2021-06-25 NOTE — Progress Notes (Signed)
I,Yamilka J Llittleton,acting as a Education administrator for [employer], FNP.,have documented all relevant documentation on the behalf of [name], FNP,as directed by  [name], FNP while in the presence of [name], [place].   This visit occurred during the SARS-CoV-2 public health emergency.  Safety protocols were in place, including screening questions prior to the visit, additional usage of staff PPE, and extensive cleaning of exam room while observing appropriate contact time as indicated for disinfecting solutions.  Subjective:     Patient ID: [name] , female    DOB: [birth year] , 86 y.o.   MRN: [id number]   Chief Complaint  Patient presents with   Annual Exam    HPI  Patient here for hm.    Past Medical History:  Diagnosis Date   Arthritis    Asthma    CAD (coronary artery disease)    s/p NSTEMI 10/19 - LHC:  pRCA 99, oLCx 30 >> DES to RCA; Echo 10/19: Mild focal basal septal hypertrophy, EF 60-65, inf-lat, inf and inf-sept HK, trivial MR, trivial TR   Diabetes mellitus without complication (HCC)    DKA (diabetic ketoacidoses) 08/31/2019   Hypertension      Family History  Problem Relation Age of Onset   Diabetes Mother    Heart disease Mother    Diabetes Father    Diabetes Sister    Heart disease Sister    Diabetes Brother      Current Outpatient Medications:    Accu-Chek FastClix Lancets MISC, USE AS DIRECTED FOR  DAILY  TESTING, Disp: 102 each, Rfl: 0   ACCU-CHEK GUIDE test strip, use to check blood sugars daily, Disp: 100 each, Rfl: 2   acetaminophen (TYLENOL) 325 MG tablet, Take by mouth every 6 (six) hours as needed for mild pain., Disp: , Rfl:    amLODipine-olmesartan (AZOR) 5-20 MG tablet, TAKE 1 TABLET BY MOUTH ONCE DAILY IN THE MORNING, Disp: 90 tablet, Rfl: 0   ANORO ELLIPTA 62.5-25 MCG/ACT AEPB, Inhale 1 puff by mouth once daily, Disp: 60 each, Rfl: 0   Cholecalciferol (VITAMIN D3) 1.25 MG (50000 UT) CAPS, TAKE 1 CAPSULE BY MOUTH EVERY FRIDAY,  Disp: 12 capsule, Rfl: 0   clopidogrel (PLAVIX) 75 MG tablet, Take 1 tablet by mouth once daily with breakfast, Disp: 90 tablet, Rfl: 2   empagliflozin (JARDIANCE) 10 MG TABS tablet, Take 1 tablet (10 mg total) by mouth daily before breakfast., Disp: 90 tablet, Rfl: 1   EUTHYROX 50 MCG tablet, Take 1 tablet by mouth once daily, Disp: 90 tablet, Rfl: 0   Insulin Pen Needle (PEN NEEDLES 29GX1/2") 29G X 12MM MISC, , Disp: , Rfl:    LANTUS SOLOSTAR 100 UNIT/ML Solostar Pen, INJECT 30 UNITS SUBCUTANEOUSLY ONCE DAILY, Disp: 15 mL, Rfl: 0   meclizine (ANTIVERT) 25 MG tablet, TAKE 1 TABLET BY MOUTH ONCE DAILY AS NEEDED FOR DIZZINESS, Disp: 30 tablet, Rfl: 0   metoprolol succinate (TOPROL-XL) 25 MG 24 hr tablet, Take 1 tablet by mouth once daily, Disp: 90 tablet, Rfl: 3   nitroGLYCERIN (NITROSTAT) 0.4 MG SL tablet, Place 1 tablet (0.4 mg total) under the tongue every 5 (five) minutes x 3 doses as needed for chest pain., Disp: 25 tablet, Rfl: 7   RELION PEN NEEDLE 31G/8MM 31G X 8 MM MISC, USE AS DIRECTED, Disp: 100 each, Rfl: 0   rosuvastatin (CRESTOR) 20 MG tablet, Take 1 tablet by mouth once daily, Disp: 90 tablet, Rfl: 2   TRULICITY 3 WC/5.8NI SOPN, INEJCT  3 MG INTO THE SKIN ONCE A WEEK, Disp: 12 mL, Rfl: 0   XIIDRA 5 % SOLN, Place 2 drops into both eyes 2 (two) times daily. , Disp: , Rfl:    No Known Allergies    The patient states she is status post hysterectomy.  No LMP recorded. Patient has had a hysterectomy.. Negative for Dysmenorrhea and Negative for Menorrhagia. Negative for: breast discharge, breast lump(s), breast pain and breast self exam. Associated symptoms include abnormal vaginal bleeding. Pertinent negatives include abnormal bleeding (hematology), anxiety, decreased libido, depression, difficulty falling sleep, dyspareunia, history of infertility, nocturia, sexual dysfunction, sleep disturbances, urinary incontinence, urinary urgency, vaginal discharge and vaginal itching. Diet regular. The  patient states her exercise level is moderate with walking daily approximately 3 days a week for 1 hour each.  She reports the heart doctor told her to walk 3 days a week vs every day. Her next appt with Cardiology is in February  The patient's tobacco use is:  Social History   Tobacco Use  Smoking Status Former   Packs/day: 0.50   Years: 60.00   Pack years: 30.00   Types: Cigarettes  Smokeless Tobacco Never   She has been exposed to passive smoke. The patient's alcohol use is:  Social History   Substance and Sexual Activity  Alcohol Use No    Review of Systems  Constitutional: Negative.   HENT: Negative.    Eyes: Negative.   Respiratory: Negative.    Cardiovascular: Negative.   Gastrointestinal: Negative.   Endocrine: Negative.   Genitourinary: Negative.   Musculoskeletal: Negative.   Skin: Negative.   Allergic/Immunologic: Negative.   Neurological: Negative.   Hematological: Negative.   Psychiatric/Behavioral: Negative.      Today's Vitals   06/25/21 0824  BP: 122/70  Pulse: 74  Temp: 97.7 F (36.5 C)  Weight: 150 lb (68 kg)  Height: _0  (1.575 m)  PainSc: 0-No pain   Body mass index is 27.44 kg/m.   Objective:  Physical Exam Constitutional:      General: She is not in acute distress.    Appearance: Normal appearance. She is well-developed.  HENT:     Head: Normocephalic and atraumatic.     Right Ear: Hearing, tympanic membrane, ear canal and external ear normal. There is no impacted cerumen.     Left Ear: Hearing, tympanic membrane, ear canal and external ear normal. There is no impacted cerumen.     Nose:     Comments: Deferred - masked    Mouth/Throat:     Comments: Deferred - masked Eyes:     General: Lids are normal.     Extraocular Movements: Extraocular movements intact.     Conjunctiva/sclera: Conjunctivae normal.     Pupils: Pupils are equal, round, and reactive to light.     Funduscopic exam:    Right eye: No papilledema.        Left  eye: No papilledema.  Neck:     Thyroid: No thyroid mass.     Vascular: No carotid bruit.  Cardiovascular:     Rate and Rhythm: Normal rate and regular rhythm.     Pulses: Normal pulses.     Heart sounds: Normal heart sounds. No murmur heard. Pulmonary:     Effort: Pulmonary effort is normal. No respiratory distress.     Breath sounds: Normal breath sounds. No wheezing.  Chest:     Chest wall: No mass.  Breasts:    Tanner Score is 5.  Right: Normal. No mass or tenderness.     Left: Normal. No mass or tenderness.  Abdominal:     General: Abdomen is flat. Bowel sounds are normal. There is no distension.     Palpations: Abdomen is soft.     Tenderness: There is no abdominal tenderness.  Musculoskeletal:        General: No swelling or tenderness. Normal range of motion.     Cervical back: Full passive range of motion without pain, normal range of motion and neck supple.     Right lower leg: No edema.     Left lower leg: No edema.  Lymphadenopathy:     Upper Body:     Right upper body: No supraclavicular, axillary or pectoral adenopathy.     Left upper body: No supraclavicular, axillary or pectoral adenopathy.  Skin:    General: Skin is warm and dry.     Capillary Refill: Capillary refill takes less than 2 seconds.  Neurological:     General: No focal deficit present.     Mental Status: She is alert and oriented to person, place, and time.     Cranial Nerves: No cranial nerve deficit.     Sensory: No sensory deficit.  Psychiatric:        Mood and Affect: Mood normal.        Behavior: Behavior normal.        Thought Content: Thought content normal.        Judgment: Judgment normal.        Assessment And Plan:     1. Encounter for general adult medical examination w/o abnormal findings Behavior modifications discussed and diet history reviewed.   Pt will continue to exercise regularly and modify diet with low GI, plant based foods and decrease intake of processed foods.   Recommend intake of daily multivitamin, Vitamin D, and calcium.  Recommend for preventive screenings, as well as recommend immunizations that include influenza, TDAP, and Shingles  2. Type 2 diabetes mellitus with complication, with long-term current use of insulin (HCC) Comments: HgbA1c is stable, she continues with her insulin. Encouraged to make sure she is taking her medications as directed.  - POCT Urinalysis Dipstick (81002) - POCT UA - Microalbumin - CMP14+EGFR - empagliflozin (JARDIANCE) 10 MG TABS tablet; Take 1 tablet (10 mg total) by mouth daily before breakfast.  Dispense: 90 tablet; Refill: 1  3. Primary hypertension Comments: Blood pressure is well controlled, continue current medications  4. Hyperlipidemia, unspecified hyperlipidemia type - Lipid panel  5. Acquired hypothyroidism Comments: Stable, continue current medications  6. Vitamin D deficiency Will check vitamin D level and supplement as needed.    Also encouraged to spend 15 minutes in the sun daily.  - VITAMIN D 25 Hydroxy (Vit-D Deficiency, Fractures)  7. Other long term (current) drug therapy - CBC  8. Encounter for hepatitis C screening test for low risk patient Will check Hepatitis C screening due to recent recommendations to screen all adults 18 years and older - Hepatitis C antibody  9. Heartburn Comments: Samples of Restora given in office, can get another brand probiotic to see if effective.  Avoid food triggers  Multiple nevi to upper posterior and anterior body (chest/back)   Patient was given opportunity to ask questions. Patient verbalized understanding of the plan and was able to repeat key elements of the plan. All questions were answered to their satisfaction.   [name], FNP   I, [name], FNP, have reviewed all documentation  for this visit. The documentation on 06/25/21 for the exam, diagnosis, procedures, and orders are all accurate and complete.   THE PATIENT IS  ENCOURAGED TO PRACTICE SOCIAL DISTANCING DUE TO THE COVID-19 PANDEMIC.

## 2021-06-26 LAB — CBC
Hematocrit: 44.5 % (ref 34.0–46.6)
Hemoglobin: 14.5 g/dL (ref 11.1–15.9)
MCH: 28.3 pg (ref 26.6–33.0)
MCHC: 32.6 g/dL (ref 31.5–35.7)
MCV: 87 fL (ref 79–97)
Platelets: 252 10*3/uL (ref 150–450)
RBC: 5.12 x10E6/uL (ref 3.77–5.28)
RDW: 13.7 % (ref 11.7–15.4)
WBC: 7.6 10*3/uL (ref 3.4–10.8)

## 2021-06-26 LAB — CMP14+EGFR
ALT: 34 IU/L — ABNORMAL HIGH (ref 0–32)
AST: 40 IU/L (ref 0–40)
Albumin/Globulin Ratio: 1.4 (ref 1.2–2.2)
Albumin: 4.3 g/dL (ref 3.6–4.6)
Alkaline Phosphatase: 85 IU/L (ref 44–121)
BUN/Creatinine Ratio: 9 — ABNORMAL LOW (ref 12–28)
BUN: 12 mg/dL (ref 8–27)
Bilirubin Total: 0.8 mg/dL (ref 0.0–1.2)
CO2: 25 mmol/L (ref 20–29)
Calcium: 9.9 mg/dL (ref 8.7–10.3)
Chloride: 101 mmol/L (ref 96–106)
Creatinine, Ser: 1.29 mg/dL — ABNORMAL HIGH (ref 0.57–1.00)
Globulin, Total: 3 g/dL (ref 1.5–4.5)
Glucose: 83 mg/dL (ref 70–99)
Potassium: 4.6 mmol/L (ref 3.5–5.2)
Sodium: 139 mmol/L (ref 134–144)
Total Protein: 7.3 g/dL (ref 6.0–8.5)
eGFR: 41 mL/min/{1.73_m2} — ABNORMAL LOW (ref >59)

## 2021-06-26 LAB — LIPID PANEL
Chol/HDL Ratio: 2.7 ratio (ref 0.0–4.4)
Cholesterol, Total: 118 mg/dL (ref 100–199)
HDL: 44 mg/dL (ref >39)
LDL Chol Calc (NIH): 56 mg/dL (ref 0–99)
Triglycerides: 93 mg/dL (ref 0–149)
VLDL Cholesterol Cal: 18 mg/dL (ref 5–40)

## 2021-06-26 LAB — HEPATITIS C ANTIBODY: Hep C Virus Ab: <0.1 s/co ratio (ref 0.0–0.9)

## 2021-06-26 LAB — VITAMIN D 25 HYDROXY (VIT D DEFICIENCY, FRACTURES): Vit D, 25-Hydroxy: 121 ng/mL — ABNORMAL HIGH (ref 30.0–100.0)

## 2021-06-27 ENCOUNTER — Other Ambulatory Visit: Payer: Self-pay | Admitting: Cardiovascular Disease

## 2021-07-09 IMAGING — CT CT HEAD W/O CM
3 series · 16 of 47 positions shown, 19 images · non-contrast
Comparison: None.

CLINICAL DATA: Altered mental status

EXAM:
CT HEAD WITHOUT CONTRAST
TECHNIQUE: Contiguous axial images were obtained from the base of the skull
through the vertex without intravenous contrast.

[Series 2: head wo · axial · 0.40mm/px · z∈[-167,-42]mm · 10 of 30 slices shown, 13 images]
[im 3/30  brain]
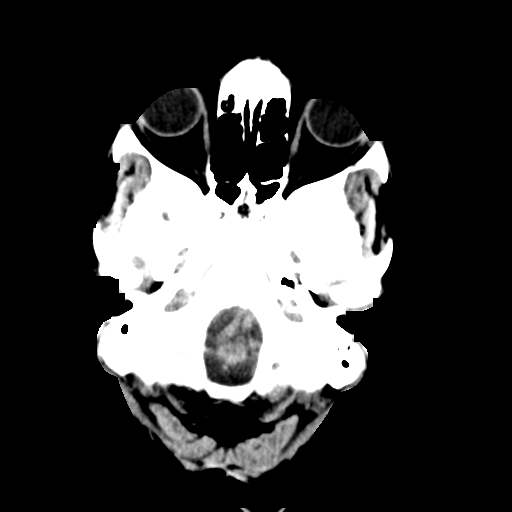
[im 3/30  bone]
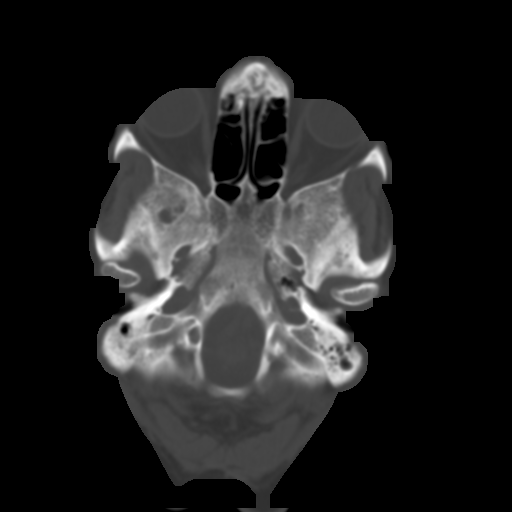
[im 6/30  brain]
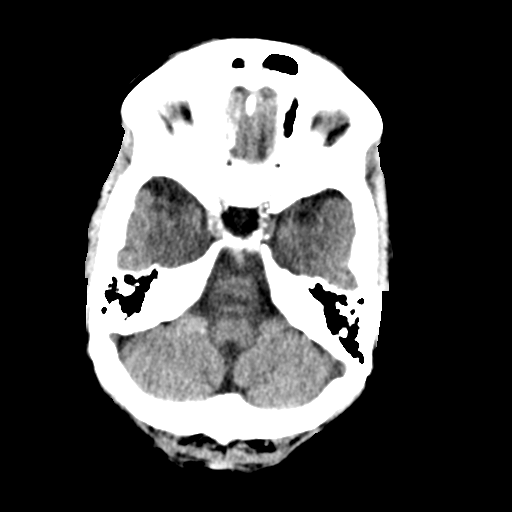
[im 9/30  brain]
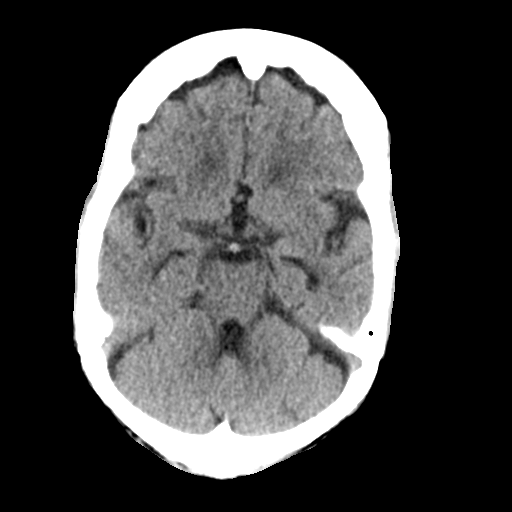
[im 11/30  brain]
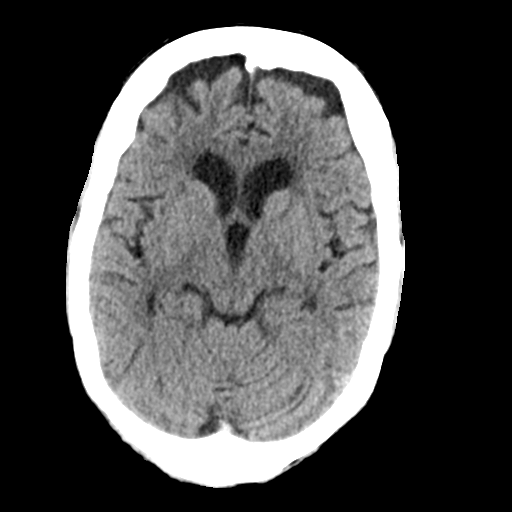
[im 14/30  brain]
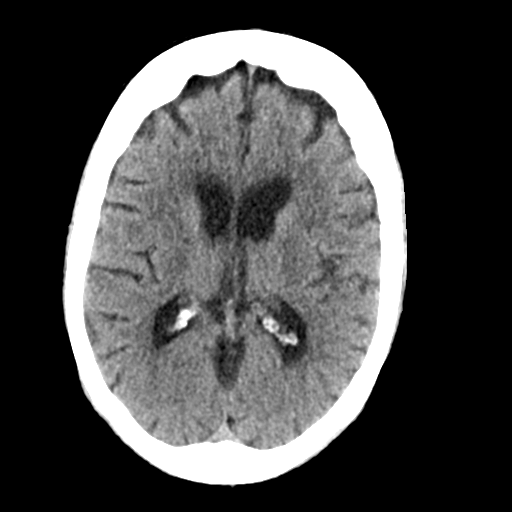
[im 14/30  bone]
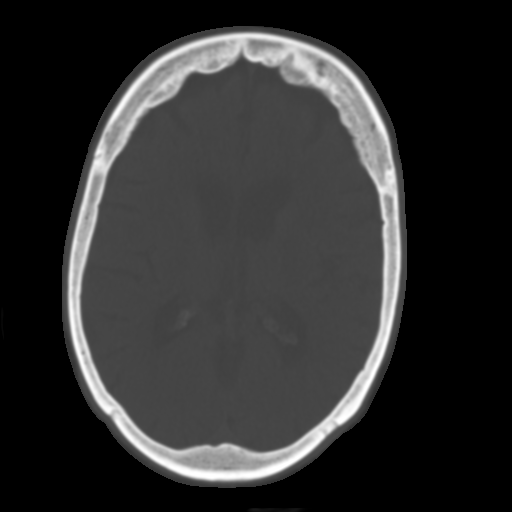
[im 17/30  brain]
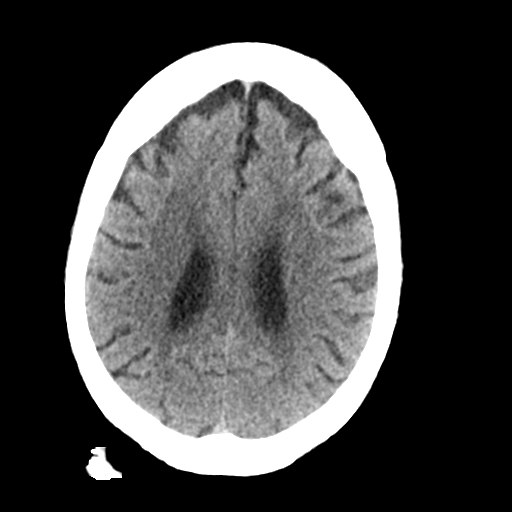
[im 20/30  brain]
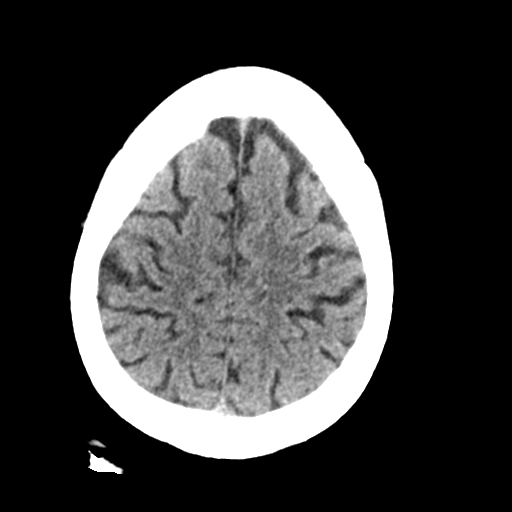
[im 23/30  brain]
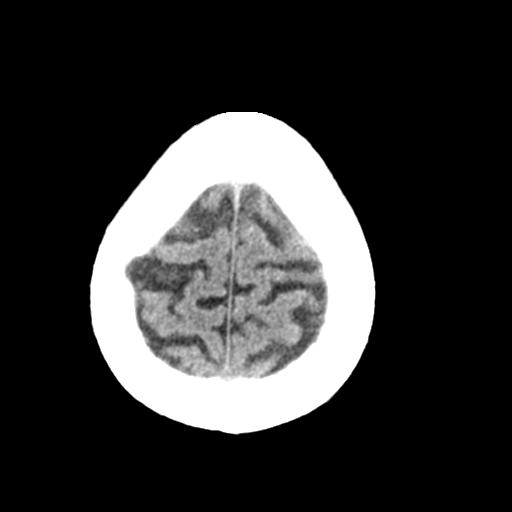
[im 25/30  brain]
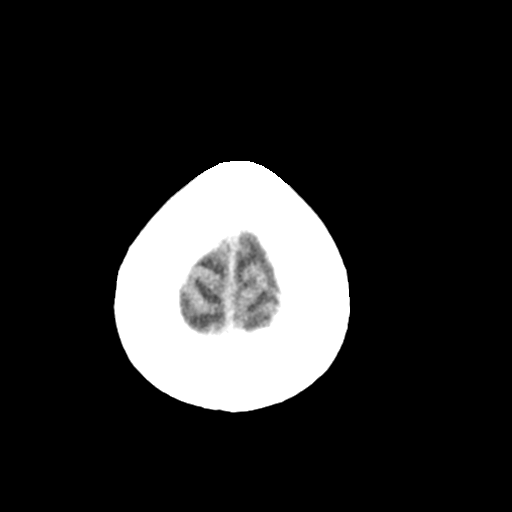
[im 25/30  bone]
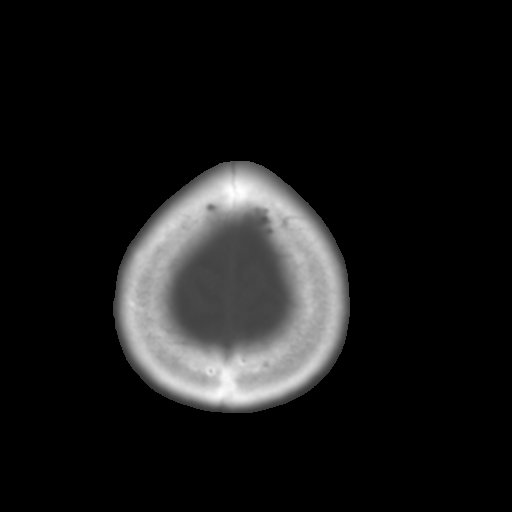
[im 28/30  brain]
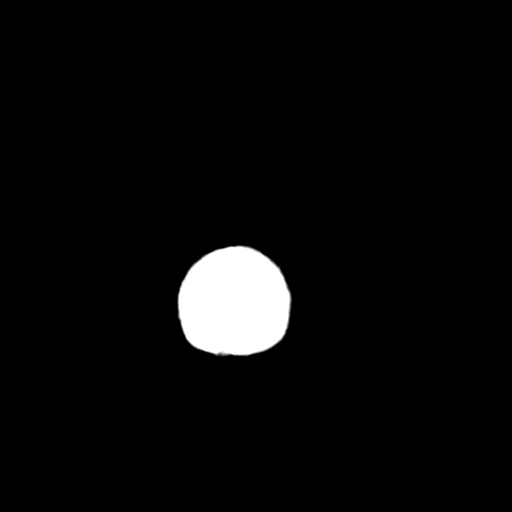

[Series 4: coronal soft tissue · coronal · 0.29mm/px · 3 of 65 slices shown]
[im 22/65  brain]
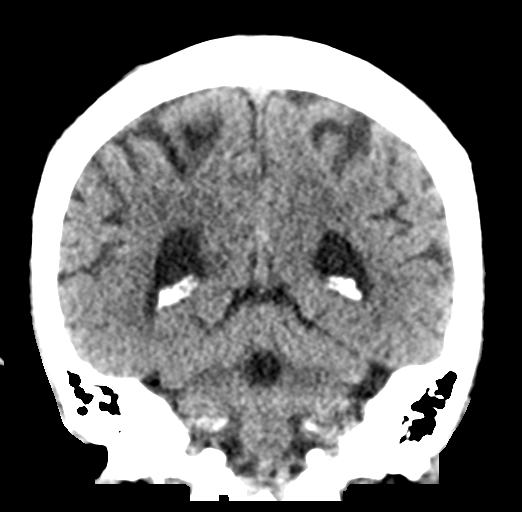
[im 29/65  brain]
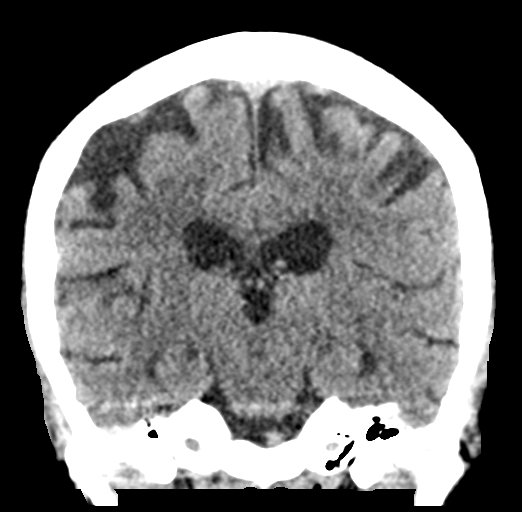
[im 36/65  brain]
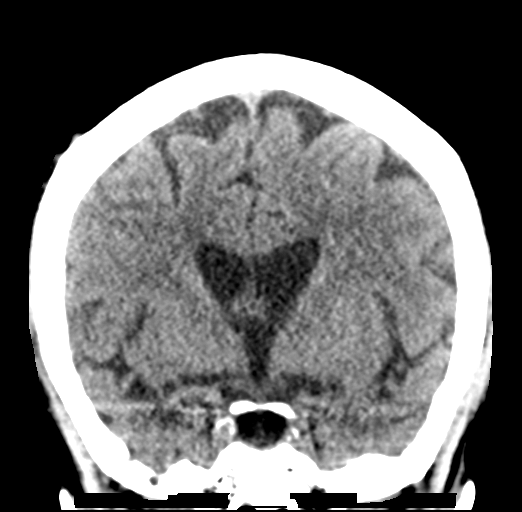

[Series 5: sagittal soft tissue · sagittal · 0.29mm/px · 3 of 51 slices shown]
[im 17/51  brain]
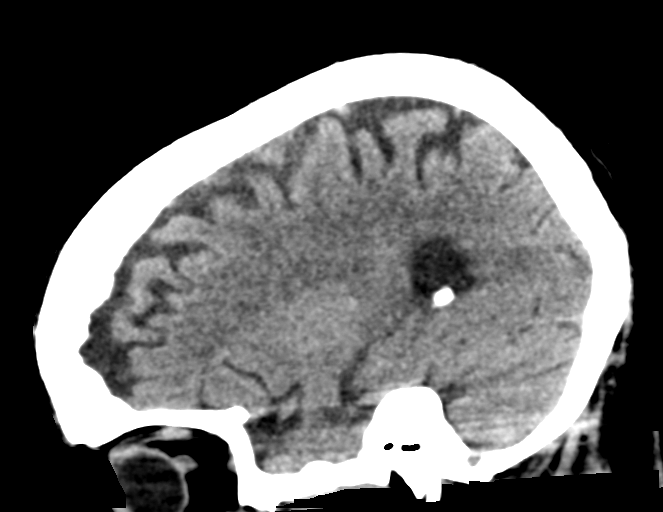
[im 26/51  brain]
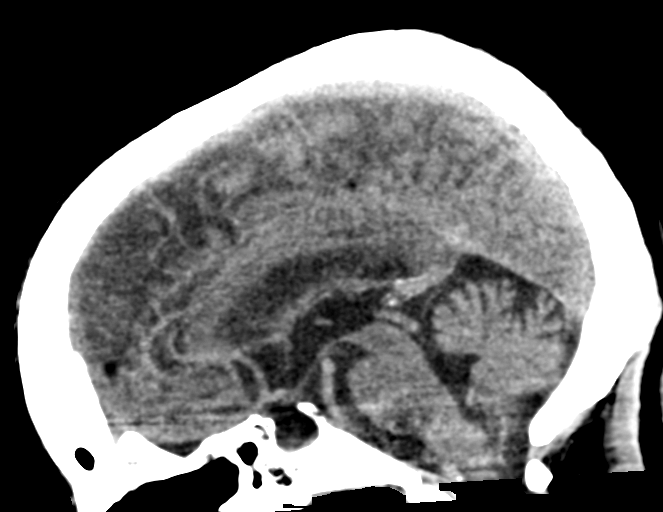
[im 34/51  brain]
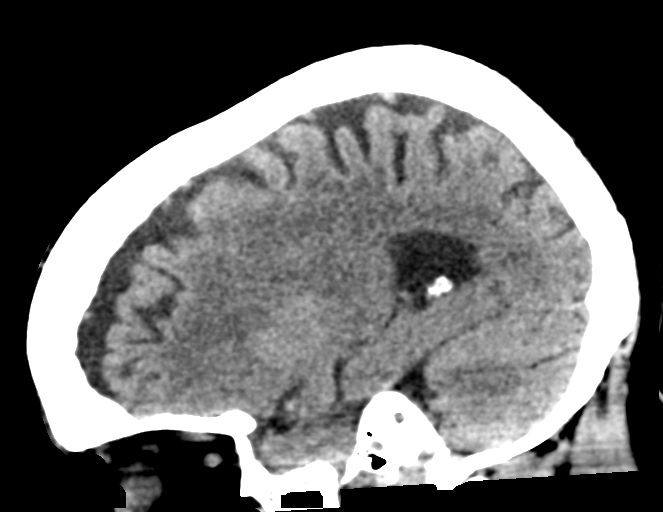

[16 of 47 positions shown; findings below may reference images not displayed]

FINDINGS: Brain: There is no acute intracranial hemorrhage, mass-effect, or
edema. Gray-white differentiation is preserved. There is no
extra-axial fluid collection. Patchy hypoattenuation in the
supratentorial white matter is nonspecific but may reflect mild
chronic microvascular ischemic changes. Ventricles and sulci are
within normal limits in size and configuration.

Vascular: There is atherosclerotic calcification at the skull base.

Skull: Calvarium is unremarkable.

Sinuses/Orbits: Minor mucosal thickening.  Left lens replacement.

Other: Mastoid air cells are clear.
IMPRESSION: No acute intracranial abnormality. Mild chronic microvascular
ischemic changes.

## 2021-07-12 ENCOUNTER — Other Ambulatory Visit: Payer: Self-pay | Admitting: Nurse Practitioner

## 2021-07-21 ENCOUNTER — Other Ambulatory Visit: Payer: Self-pay | Admitting: Nurse Practitioner

## 2021-07-28 ENCOUNTER — Other Ambulatory Visit: Payer: Self-pay | Admitting: Nurse Practitioner

## 2021-07-28 DIAGNOSIS — Z794 Long term (current) use of insulin: Secondary | ICD-10-CM

## 2021-08-18 ENCOUNTER — Other Ambulatory Visit: Payer: Self-pay | Admitting: Nurse Practitioner

## 2021-08-18 ENCOUNTER — Other Ambulatory Visit: Payer: Self-pay | Admitting: Physician Assistant

## 2021-08-18 DIAGNOSIS — I2510 Atherosclerotic heart disease of native coronary artery without angina pectoris: Secondary | ICD-10-CM

## 2021-09-03 ENCOUNTER — Ambulatory Visit (INDEPENDENT_AMBULATORY_CARE_PROVIDER_SITE_OTHER): Payer: Commercial Managed Care - HMO | Admitting: Podiatry

## 2021-09-03 ENCOUNTER — Other Ambulatory Visit: Payer: Self-pay

## 2021-09-03 ENCOUNTER — Encounter: Payer: Self-pay | Admitting: Podiatry

## 2021-09-03 DIAGNOSIS — M2012 Hallux valgus (acquired), left foot: Secondary | ICD-10-CM | POA: Diagnosis not present

## 2021-09-03 DIAGNOSIS — B351 Tinea unguium: Secondary | ICD-10-CM | POA: Diagnosis not present

## 2021-09-03 DIAGNOSIS — M79674 Pain in right toe(s): Secondary | ICD-10-CM

## 2021-09-03 DIAGNOSIS — M2011 Hallux valgus (acquired), right foot: Secondary | ICD-10-CM | POA: Diagnosis not present

## 2021-09-03 DIAGNOSIS — M2142 Flat foot [pes planus] (acquired), left foot: Secondary | ICD-10-CM | POA: Diagnosis not present

## 2021-09-03 DIAGNOSIS — M2141 Flat foot [pes planus] (acquired), right foot: Secondary | ICD-10-CM

## 2021-09-03 DIAGNOSIS — E1142 Type 2 diabetes mellitus with diabetic polyneuropathy: Secondary | ICD-10-CM

## 2021-09-03 DIAGNOSIS — M79675 Pain in left toe(s): Secondary | ICD-10-CM

## 2021-09-03 DIAGNOSIS — E119 Type 2 diabetes mellitus without complications: Secondary | ICD-10-CM | POA: Diagnosis not present

## 2021-09-03 NOTE — Progress Notes (Signed)
ANNUAL DIABETIC FOOT EXAM  Subjective: [name] presents today for annual diabetic foot examination, at risk foot care with history of diabetic neuropathy, and painful elongated mycotic toenails 1-5 bilaterally which are tender when wearing enclosed shoe gear. Pain is relieved with periodic professional debridement..  Patient relates 30 year h/o diabetes.  Patient denies any h/o foot wounds.  Patient has been diagnosed with neuropathy and does not take any medications for it. She states, "I do not want to take any more pills".  Patient's blood sugar was 125 mg/dl this morning.   [name], FNP is patient's PCP. Last visit was 06/25/2021.  Past Medical History:  Diagnosis Date   Arthritis    Asthma    CAD (coronary artery disease)    s/p NSTEMI 10/19 - LHC:  pRCA 99, oLCx 30 >> DES to RCA; Echo 10/19: Mild focal basal septal hypertrophy, EF 60-65, inf-lat, inf and inf-sept HK, trivial MR, trivial TR   Diabetes mellitus without complication ([place])    DKA (diabetic ketoacidoses) 08/31/2019   Hypertension    Patient Active Problem List   Diagnosis Date Noted   Screening for malignant neoplasm of colon 10/22/2020   Gastroesophageal reflux disease 10/22/2020   Acquired hypothyroidism 03/12/2020   COVID-19 virus infection 08/31/2019   AKI (acute kidney injury) ([place]) 08/31/2019   CAD (coronary artery disease) 09/27/2018   Hypertension 06/16/2018   Hyperlipidemia 06/16/2018   Type 2 diabetes mellitus with complication, with long-term current use of insulin ([place]) 06/16/2018   Hx of NSTEMI 10/19 Tx with DES to pRCA 06/14/2018   Lymphoproliferative disease ([place]) 10/03/2015   Past Surgical History:  Procedure Laterality Date   ABDOMINAL HYSTERECTOMY  "many yrs ago"   AXILLARY LYMPH NODE BIOPSY Right 02/26/2015   Procedure: RIGHT AXILLARY LYMPH NODE BIOPSY;  Surgeon: Jackolyn Confer, MD;  Location: [facility];  Service: General;  Laterality: Right;   CHOLECYSTECTOMY     CORONARY  STENT INTERVENTION N/A 06/15/2018   Procedure: CORONARY STENT INTERVENTION;  Surgeon: Wellington Hampshire, MD;  Location: [facility];  Service: Cardiovascular;  Laterality: N/A;   LEFT HEART CATH AND CORONARY ANGIOGRAPHY N/A 06/15/2018   Procedure: LEFT HEART CATH AND CORONARY ANGIOGRAPHY;  Surgeon: Wellington Hampshire, MD;  Location: [facility];  Service: Cardiovascular;  Laterality: N/A;   Current Outpatient Medications on File Prior to Visit  Medication Sig Dispense Refill   Accu-Chek FastClix Lancets MISC USE AS DIRECTED FOR  DAILY  TESTING 102 each 0   ACCU-CHEK GUIDE test strip use to check blood sugars daily 100 each 2   acetaminophen (TYLENOL) 325 MG tablet Take by mouth every 6 (six) hours as needed for mild pain.     amLODIPine-Olmesartan (AZOR PO) Azor     amLODipine-olmesartan (AZOR) 5-20 MG tablet TAKE 1 TABLET BY MOUTH ONCE DAILY IN THE MORNING 90 tablet 0   ANORO ELLIPTA 62.5-25 MCG/ACT AEPB Inhale 1 puff by mouth once daily 60 each 0   Aspirin 81 MG CAPS Aspirin 81 mg     clopidogrel (PLAVIX) 75 MG tablet Take 1 tablet by mouth once daily with breakfast 90 tablet 2   empagliflozin (JARDIANCE) 10 MG TABS tablet Take 1 tablet (10 mg total) by mouth daily before breakfast. 90 tablet 1   Ergocalciferol (VITAMIN D2 PO) Vitamin D2 (obsolete)     FLUZONE HIGH-DOSE QUADRIVALENT 0.7 ML SUSY      GLIPIZIDE PO Glipizide     GLYBURIDE-METFORMIN PO Metformin  insulin glargine (LANTUS) 100 UNIT/ML Solostar Pen Lantus Solostar Pen     Insulin Pen Needle (PEN NEEDLES 29GX1/2") 29G X 12MM MISC      LANTUS SOLOSTAR 100 UNIT/ML Solostar Pen INJECT 30 UNITS SUBCUTANEOUSLY ONCE DAILY 15 mL 0   levothyroxine (SYNTHROID) 50 MCG tablet Take 1 tablet by mouth once daily 90 tablet 0   meclizine (ANTIVERT) 25 MG tablet TAKE 1 TABLET BY MOUTH ONCE DAILY AS NEEDED FOR DIZZINESS 30 tablet 0   metoprolol succinate (TOPROL-XL) 25 MG 24 hr tablet Take 1 tablet by mouth once daily 90 tablet 3    NAPROXEN PO Naproxen     nitroGLYCERIN (NITROSTAT) 0.4 MG SL tablet Place 1 tablet (0.4 mg total) under the tongue every 5 (five) minutes x 3 doses as needed for chest pain. 25 tablet 7   RELION PEN NEEDLE 31G/8MM 31G X 8 MM MISC USE AS DIRECTED 100 each 0   rosuvastatin (CRESTOR) 20 MG tablet Take 1 tablet by mouth once daily 90 tablet 0   Rosuvastatin Calcium (CRESTOR PO) Crestor     SITagliptin Phosphate (JANUVIA PO) Januvia     TRULICITY 3 WC/5.8NI SOPN INJECT 3 MG INTO THE SKIN ONCE A WEEK 24 mL 0   XIIDRA 5 % SOLN Place 2 drops into both eyes 2 (two) times daily.      No current facility-administered medications on file prior to visit.    No Known Allergies Social History   Occupational History   Occupation: retired  Tobacco Use   Smoking status: Former    Packs/day: 0.50    Years: 60.00    Pack years: 30.00    Types: Cigarettes   Smokeless tobacco: Never  Vaping Use   Vaping Use: Never used  Substance and Sexual Activity   Alcohol use: No   Drug use: No   Sexual activity: Not Currently   Family History  Problem Relation Age of Onset   Diabetes Mother    Heart disease Mother    Diabetes Father    Diabetes Sister    Heart disease Sister    Diabetes Brother    Immunization History  Administered Date(s) Administered   Influenza-Unspecified 06/24/2018, 04/22/2020, 04/30/2021   PFIZER Comirnaty(Fross Top)Covid-19 Tri-Sucrose Vaccine 12/11/2020   PFIZER(Purple Top)SARS-COV-2 Vaccination 10/17/2019, 11/07/2019, 06/03/2020, 06/02/2021   PNEUMOCOCCAL CONJUGATE-20 11/14/2020   Pneumococcal Conjugate-13 04/11/2014   Pneumococcal Polysaccharide-23 05/16/2015   Tdap 09/13/2020   Zoster Recombinat (Shingrix) 05/04/2017, 07/04/2017   Zoster, Live 04/11/2014     Review of Systems: Negative except as noted in the HPI.   Objective: There were no vitals filed for this visit.  Abbigayle Toole is a pleasant 87 y.o. female in NAD. AAO X 3.  Vascular Examination: CFT  immediate b/l LE. Palpable DP/PT pulses b/l LE. Digital hair present b/l. Skin temperature gradient WNL b/l. No pain with calf compression b/l. No edema noted b/l. No cyanosis or clubbing noted b/l LE.  Dermatological Examination: Pedal integument with normal turgor, texture and tone b/l LE. No open wounds b/l. No interdigital macerations b/l. Toenails 1-5 b/l elongated, thickened, discolored with subungual debris. +Tenderness with dorsal palpation of nailplates. No hyperkeratotic or porokeratotic lesions present.  Musculoskeletal Examination: Normal muscle strength 5/5 to all lower extremity muscle groups bilaterally. HAV with bunion deformity noted b/l LE. Pes planus deformity noted bilateral LE. No pain, crepitus or joint limitation noted with ROM b/l LE.  Patient ambulates independently without assistive aids.  Footwear Assessment: Does the patient wear appropriate  shoes? Yes. Does the patient need inserts/orthotics? No.  Neurological Examination: Protective sensation intact 5/5 intact bilaterally with 10g monofilament b/l. Vibratory sensation decreased b/l. Clonus negative b/l.  Hemoglobin A1C Latest Ref Rng & Units 03/17/2021 12/11/2020 09/12/2020  HGBA1C 4.8 - 5.6 % 9.1(H) 9.0(H) 10.1(H)  Some recent data might be hidden   Assessment: 1. Pain due to onychomycosis of toenails of both feet   2. Diabetic peripheral neuropathy associated with type 2 diabetes mellitus (HCC)   3. Hallux valgus, acquired, bilateral   4. Pes planus of both feet   5. Encounter for diabetic foot exam ([place])     ADA Risk Categorization: Low Risk :  Patient has all of the following: Intact protective sensation No prior foot ulcer  No severe deformity Pedal pulses present  Plan: -Examined patient. -Diabetic foot examination performed today. -Continue foot and shoe inspections daily. Monitor blood glucose per PCP/Endocrinologist's recommendations. -Mycotic toenails 1-5 bilaterally were debrided in length  and girth with sterile nail nippers and dremel without incident. -Patient/POA to call should there be question/concern in the interim.  Return in about 3 months (around 12/02/2021).  Marzetta Board, DPM

## 2021-09-10 ENCOUNTER — Other Ambulatory Visit: Payer: Self-pay | Admitting: Nurse Practitioner

## 2021-09-10 DIAGNOSIS — Z794 Long term (current) use of insulin: Secondary | ICD-10-CM

## 2021-09-10 DIAGNOSIS — E118 Type 2 diabetes mellitus with unspecified complications: Secondary | ICD-10-CM

## 2021-09-22 ENCOUNTER — Other Ambulatory Visit: Payer: Self-pay | Admitting: Nurse Practitioner

## 2021-09-24 ENCOUNTER — Ambulatory Visit (INDEPENDENT_AMBULATORY_CARE_PROVIDER_SITE_OTHER): Payer: Medicare Other | Admitting: Cardiovascular Disease

## 2021-09-24 ENCOUNTER — Encounter: Payer: Self-pay | Admitting: Cardiovascular Disease

## 2021-09-24 ENCOUNTER — Other Ambulatory Visit: Payer: Self-pay

## 2021-09-24 VITALS — BP 120/72 | HR 81 | Ht 62.5 in | Wt 152.2 lb

## 2021-09-24 DIAGNOSIS — I10 Essential (primary) hypertension: Secondary | ICD-10-CM

## 2021-09-24 DIAGNOSIS — E782 Mixed hyperlipidemia: Secondary | ICD-10-CM

## 2021-09-24 DIAGNOSIS — I2510 Atherosclerotic heart disease of native coronary artery without angina pectoris: Secondary | ICD-10-CM | POA: Diagnosis not present

## 2021-09-24 MED ORDER — ASPIRIN EC 81 MG PO TBEC: 81.0000 mg | DELAYED_RELEASE_TABLET | Freq: Every day | ORAL | 3 refills | Status: AC

## 2021-09-24 NOTE — Patient Instructions (Signed)
Medication Instructions:  STOP Plavix (Clopidogrel) START Aspirin 81mg  daily *If you need a refill on your cardiac medications before your next appointment, please call your pharmacy*   Lab Work: NONE If you have labs (blood work) drawn today and your tests are completely normal, you will receive your results only by: [place] (if you have MyChart) OR A paper copy in the mail If you have any lab test that is abnormal or we need to change your treatment, we will call you to review the results.   Testing/Procedures: NONE   Follow-Up: At [facility], you and your health needs are our priority.  As part of our continuing mission to provide you with exceptional heart care, we have created designated Provider Care Teams.  These Care Teams include your primary Cardiologist (physician) and Advanced Practice Providers (APPs -  Physician Assistants and Nurse Practitioners) who all work together to provide you with the care you need, when you need it.   Your next appointment:   1 year(s)  The format for your next appointment:   In Person  Provider:   Sherren Mocha, MD     Other Instructions Thank you for allowing myself and [facility] to serve you, God Bless!!

## 2021-09-24 NOTE — Progress Notes (Signed)
Cardiology Office Note:    Date:  09/24/2021   ID:  [name], DOB 1935/02/04, MRN [id number]  PCP:  [name], [facility] Providers Cardiologist:  Sherren Mocha, MD     Referring MD: [name], FNP   Chief Complaint  Patient presents with   Coronary Artery Disease    History of Present Illness:    [name] is a 87 y.o. female with a hx of: Coronary artery disease S/p NSTEMI 05/2018: DES to the proximal RCA Echo 05/2018: EF 60-65 with inferior hypokinesis Diabetes mellitus Hypertension Hyperlipidemia Lymphoproliferative disorder Chronic kidney disease   The patient is here alone today. She is doing very well. She walks several miles each day in the park without exertional symptoms. Today, she denies symptoms of palpitations, chest pain, shortness of breath, orthopnea, PND, lower extremity edema, dizziness, or syncope. Prior to her PCI procedure she had typical symptoms of angina.    Past Medical History:  Diagnosis Date   Arthritis    Asthma    CAD (coronary artery disease)    s/p NSTEMI 10/19 - LHC:  pRCA 99, oLCx 30 >> DES to RCA; Echo 10/19: Mild focal basal septal hypertrophy, EF 60-65, inf-lat, inf and inf-sept HK, trivial MR, trivial TR   Diabetes mellitus without complication ([place])    DKA (diabetic ketoacidoses) 08/31/2019   Hypertension     Past Surgical History:  Procedure Laterality Date   ABDOMINAL HYSTERECTOMY  "many yrs ago"   AXILLARY LYMPH NODE BIOPSY Right 02/26/2015   Procedure: RIGHT AXILLARY LYMPH NODE BIOPSY;  Surgeon: Jackolyn Confer, MD;  Location: [facility];  Service: General;  Laterality: Right;   CHOLECYSTECTOMY     CORONARY STENT INTERVENTION N/A 06/15/2018   Procedure: CORONARY STENT INTERVENTION;  Surgeon: Wellington Hampshire, MD;  Location: [facility];  Service: Cardiovascular;  Laterality: N/A;   LEFT HEART CATH AND CORONARY ANGIOGRAPHY N/A 06/15/2018   Procedure: LEFT HEART CATH AND CORONARY  ANGIOGRAPHY;  Surgeon: Wellington Hampshire, MD;  Location: [facility];  Service: Cardiovascular;  Laterality: N/A;    Current Medications: Current Meds  Medication Sig   Accu-Chek FastClix Lancets MISC USE AS DIRECTED FOR  DAILY  TESTING   ACCU-CHEK GUIDE test strip USE TO CHECK BLOOD SUGAR ONCE DAILY   acetaminophen (TYLENOL) 325 MG tablet Take by mouth every 6 (six) hours as needed for mild pain.   amLODipine-olmesartan (AZOR) 5-20 MG tablet TAKE 1 TABLET BY MOUTH ONCE DAILY IN THE MORNING   ANORO ELLIPTA 62.5-25 MCG/ACT AEPB Inhale 1 puff by mouth once daily   aspirin EC 81 MG tablet Take 1 tablet (81 mg total) by mouth daily. Swallow whole.   Cholecalciferol (VITAMIN D3) 10 MCG (400 UNIT) tablet Take 400 Units by mouth daily. Per patient taking 2000 iu   empagliflozin (JARDIANCE) 10 MG TABS tablet Take 1 tablet (10 mg total) by mouth daily before breakfast.   FLUZONE HIGH-DOSE QUADRIVALENT 0.7 ML SUSY    insulin glargine (LANTUS) 100 UNIT/ML Solostar Pen Lantus Solostar Pen   Insulin Pen Needle (PEN NEEDLES 29GX1/2") 29G X 12MM MISC    levothyroxine (SYNTHROID) 50 MCG tablet Take 1 tablet by mouth once daily   meclizine (ANTIVERT) 25 MG tablet TAKE 1 TABLET BY MOUTH ONCE DAILY AS NEEDED FOR DIZZINESS   metoprolol succinate (TOPROL-XL) 25 MG 24 hr tablet Take 1 tablet by mouth once daily   nitroGLYCERIN (NITROSTAT) 0.4 MG SL tablet Place 1 tablet (  0.4 mg total) under the tongue every 5 (five) minutes x 3 doses as needed for chest pain.   RELION PEN NEEDLE 31G/8MM 31G X 8 MM MISC USE AS DIRECTED   rosuvastatin (CRESTOR) 20 MG tablet Take 1 tablet by mouth once daily   TRULICITY 3 JO/8.4ZY SOPN INJECT 3 MG INTO THE SKIN ONCE A WEEK   XIIDRA 5 % SOLN Place 2 drops into both eyes 2 (two) times daily.    [DISCONTINUED] Aspirin 81 MG CAPS Aspirin 81 mg   [DISCONTINUED] clopidogrel (PLAVIX) 75 MG tablet Take 1 tablet by mouth once daily with breakfast     Allergies:   Patient has no  known allergies.   Social History   Socioeconomic History   Marital status: Married    Spouse name: Not on file   Number of children: Not on file   Years of education: Not on file   Highest education level: Not on file  Occupational History   Occupation: retired  Tobacco Use   Smoking status: Former    Packs/day: 0.50    Years: 60.00    Pack years: 30.00    Types: Cigarettes   Smokeless tobacco: Never  Vaping Use   Vaping Use: Never used  Substance and Sexual Activity   Alcohol use: No   Drug use: No   Sexual activity: Not Currently  Other Topics Concern   Not on file  Social History Narrative   Not on file   Social Determinants of Health   Financial Resource Strain: Low Risk    Difficulty of Paying Living Expenses: Not hard at all  Food Insecurity: No Food Insecurity   Worried About Charity fundraiser in the Last Year: Never true   [place] in the Last Year: Never true  Transportation Needs: No Transportation Needs   Lack of Transportation (Medical): No   Lack of Transportation (Non-Medical): No  Physical Activity: Sufficiently Active   Days of Exercise per Week: 3 days   Minutes of Exercise per Session: 60 min  Stress: No Stress Concern Present   Feeling of Stress : Not at all  Social Connections: Not on file     Family History: The patient's family history includes Diabetes in her brother, father, mother, and sister; Heart disease in her mother and sister.  ROS:   Please see the history of present illness.    All other systems reviewed and are negative.  EKGs/Labs/Other Studies Reviewed:    The following studies were reviewed today: Cardiac cath images from 2019 are personally reviewed.   EKG:  EKG is not ordered today.    Recent Labs: 03/17/2021: TSH 0.886 06/25/2021: ALT 34; BUN 12; Creatinine, Ser 1.29; Hemoglobin 14.5; Platelets 252; Potassium 4.6; Sodium 139  Recent Lipid Panel    Component Value Date/Time   CHOL 118 06/25/2021 0911    TRIG 93 06/25/2021 0911   HDL 44 06/25/2021 0911   CHOLHDL 2.7 06/25/2021 0911   CHOLHDL 2.6 06/15/2018 0542   VLDL 29 06/15/2018 0542   LDLCALC 56 06/25/2021 0911     Risk Assessment/Calculations:           Physical Exam:    VS:  BP 120/72    Pulse 81    Ht 5' 2.5" (1.588 m)    Wt 152 lb 3.2 oz (69 kg)    SpO2 95%    BMI 27.39 kg/m     Wt Readings from Last 3 Encounters:  09/24/21 152 lb  3.2 oz (69 kg)  06/25/21 150 lb (68 kg)  06/19/21 151 lb (68.5 kg)     GEN:  Well nourished, well developed in no acute distress HEENT: Normal NECK: No JVD; No carotid bruits LYMPHATICS: No lymphadenopathy CARDIAC: RRR, no murmurs, rubs, gallops RESPIRATORY:  Clear to auscultation without rales, wheezing or rhonchi  ABDOMEN: Soft, non-tender, non-distended MUSCULOSKELETAL:  No edema; No deformity  SKIN: Warm and dry NEUROLOGIC:  Alert and oriented x 3 PSYCHIATRIC:  Normal affect   ASSESSMENT:    1. Coronary artery disease involving native coronary artery of native heart without angina pectoris   2. Mixed hyperlipidemia   3. Essential hypertension    PLAN:    In order of problems listed above:  Doing very well. Stop clopidogrel. Start ASA 81 mg daily (3 years out from PCI). Continue other medications without change.  Treated with rosuvastatin. LDL 56 mg/dL. ALT 34 mg/dL.  BP well controlled on current Rx in setting of CKD 3a disease.   Overall the patient is doing extremely well from a CV perspective. She quit smoking at the time of her ACS presentation in 2019. She is compliant with her medications, she's exercising regularly, and she's following up routinely with her PCP, nephrologist, and with our practice for CV care.            Medication Adjustments/Labs and Tests Ordered: Current medicines are reviewed at length with the patient today.  Concerns regarding medicines are outlined above.  No orders of the defined types were placed in this encounter.  Meds ordered this  encounter  Medications   aspirin EC 81 MG tablet    Sig: Take 1 tablet (81 mg total) by mouth daily. Swallow whole.    Dispense:  90 tablet    Refill:  3    Patient Instructions  Medication Instructions:  STOP Plavix (Clopidogrel) START Aspirin 81mg  daily *If you need a refill on your cardiac medications before your next appointment, please call your pharmacy*   Lab Work: NONE If you have labs (blood work) drawn today and your tests are completely normal, you will receive your results only by: [place] (if you have MyChart) OR A paper copy in the mail If you have any lab test that is abnormal or we need to change your treatment, we will call you to review the results.   Testing/Procedures: NONE   Follow-Up: At [facility], you and your health needs are our priority.  As part of our continuing mission to provide you with exceptional heart care, we have created designated Provider Care Teams.  These Care Teams include your primary Cardiologist (physician) and Advanced Practice Providers (APPs -  Physician Assistants and Nurse Practitioners) who all work together to provide you with the care you need, when you need it.   Your next appointment:   1 year(s)  The format for your next appointment:   In Person  Provider:   Sherren Mocha, MD     Other Instructions Thank you for allowing myself and [facility] to serve you, God Bless!!     Signed, Sherren Mocha, MD  09/24/2021 5:35 PM    [facility]

## 2021-10-01 ENCOUNTER — Ambulatory Visit (INDEPENDENT_AMBULATORY_CARE_PROVIDER_SITE_OTHER): Payer: Medicare Other | Admitting: Nurse Practitioner

## 2021-10-01 ENCOUNTER — Other Ambulatory Visit: Payer: Self-pay

## 2021-10-01 ENCOUNTER — Encounter: Payer: Self-pay | Admitting: Nurse Practitioner

## 2021-10-01 VITALS — BP 128/70 | HR 78 | Temp 97.8°F | Ht 62.4 in | Wt 149.0 lb

## 2021-10-01 DIAGNOSIS — I10 Essential (primary) hypertension: Secondary | ICD-10-CM | POA: Diagnosis not present

## 2021-10-01 DIAGNOSIS — Z794 Long term (current) use of insulin: Secondary | ICD-10-CM | POA: Diagnosis not present

## 2021-10-01 DIAGNOSIS — E039 Hypothyroidism, unspecified: Secondary | ICD-10-CM

## 2021-10-01 DIAGNOSIS — I252 Old myocardial infarction: Secondary | ICD-10-CM

## 2021-10-01 DIAGNOSIS — E118 Type 2 diabetes mellitus with unspecified complications: Secondary | ICD-10-CM | POA: Diagnosis not present

## 2021-10-01 DIAGNOSIS — E785 Hyperlipidemia, unspecified: Secondary | ICD-10-CM | POA: Diagnosis not present

## 2021-10-01 NOTE — Progress Notes (Signed)
I,[name],acting as a Education administrator for [employer], FNP.,have documented all relevant documentation on the behalf of Madeline Brine, FNP,as directed by  Madeline Brine, FNP while in the presence of [name], [place].  This visit occurred during the SARS-CoV-2 public health emergency.  Safety protocols were in place, including screening questions prior to the visit, additional usage of staff PPE, and extensive cleaning of exam room while observing appropriate contact time as indicated for disinfecting solutions.  Subjective:     Patient ID: [name] , female    DOB: Jan 05, 1935 , 87 y.o.   MRN: [id number]   Chief Complaint  Patient presents with   Diabetes    HPI  Patient is here for htn and diabetes. She has been out of her Trulicity since early January at her [place].   Diabetes She presents for her follow-up diabetic visit. She has type 2 diabetes mellitus. There are no hypoglycemic associated symptoms. Pertinent negatives for hypoglycemia include no headaches. Pertinent negatives for diabetes include no chest pain, no fatigue, no polydipsia, no polyphagia and no polyuria. There are no hypoglycemic complications. There are no diabetic complications. Risk factors for coronary artery disease include diabetes mellitus. Current diabetic treatment includes oral agent (dual therapy). She is compliant with treatment all of the time. When asked about meal planning, she reported none. She participates in exercise daily. There is no change in her home blood glucose trend. (Blood sugar up to 125 ) She sees a podiatrist (Dr. Valentina Lucks - seen in August 2021).Eye exam is current (done December 2020).  Hypertension This is a chronic problem. The current episode started more than 1 year ago. The problem has been gradually improving since onset. The problem is controlled. Pertinent negatives include no anxiety, chest pain, headaches or shortness of breath. There are no associated agents to  hypertension. Past treatments include angiotensin blockers, calcium channel blockers and beta blockers. There are no compliance problems.  Hypertensive end-organ damage includes kidney disease and CAD/MI. There is no history of angina. Identifiable causes of hypertension include chronic renal disease and a thyroid problem.    Past Medical History:  Diagnosis Date   Arthritis    Asthma    CAD (coronary artery disease)    s/p NSTEMI 10/19 - LHC:  pRCA 99, oLCx 30 >> DES to RCA; Echo 10/19: Mild focal basal septal hypertrophy, EF 60-65, inf-lat, inf and inf-sept HK, trivial MR, trivial TR   Diabetes mellitus without complication (HCC)    DKA (diabetic ketoacidoses) 08/31/2019   Hypertension      Family History  Problem Relation Age of Onset   Diabetes Mother    Heart disease Mother    Diabetes Father    Diabetes Sister    Heart disease Sister    Diabetes Brother      Current Outpatient Medications:    Accu-Chek FastClix Lancets MISC, USE AS DIRECTED FOR  DAILY  TESTING, Disp: 102 each, Rfl: 0   ACCU-CHEK GUIDE test strip, USE TO CHECK BLOOD SUGAR ONCE DAILY, Disp: 100 each, Rfl: 0   acetaminophen (TYLENOL) 325 MG tablet, Take by mouth every 6 (six) hours as needed for mild pain., Disp: , Rfl:    amLODipine-olmesartan (AZOR) 5-20 MG tablet, TAKE 1 TABLET BY MOUTH ONCE DAILY IN THE MORNING, Disp: 90 tablet, Rfl: 0   ANORO ELLIPTA 62.5-25 MCG/ACT AEPB, Inhale 1 puff by mouth once daily, Disp: 60 each, Rfl: 0   aspirin EC 81 MG tablet, Take 1 tablet (81 mg  total) by mouth daily. Swallow whole., Disp: 90 tablet, Rfl: 3   Cholecalciferol (VITAMIN D3) 10 MCG (400 UNIT) tablet, Take 400 Units by mouth daily. Per patient taking 2000 iu, Disp: , Rfl:    empagliflozin (JARDIANCE) 10 MG TABS tablet, Take 1 tablet (10 mg total) by mouth daily before breakfast., Disp: 90 tablet, Rfl: 1   Ergocalciferol (VITAMIN D2 PO), Vitamin D2 (obsolete) (Patient not taking: Reported on 09/24/2021), Disp: , Rfl:     FLUZONE HIGH-DOSE QUADRIVALENT 0.7 ML SUSY, , Disp: , Rfl:    insulin glargine (LANTUS) 100 UNIT/ML Solostar Pen, Lantus Solostar Pen, Disp: , Rfl:    Insulin Pen Needle (PEN NEEDLES 29GX1/2") 29G X 12MM MISC, , Disp: , Rfl:    levothyroxine (SYNTHROID) 50 MCG tablet, Take 1 tablet by mouth once daily, Disp: 90 tablet, Rfl: 0   meclizine (ANTIVERT) 25 MG tablet, TAKE 1 TABLET BY MOUTH ONCE DAILY AS NEEDED FOR DIZZINESS, Disp: 30 tablet, Rfl: 0   metoprolol succinate (TOPROL-XL) 25 MG 24 hr tablet, Take 1 tablet by mouth once daily, Disp: 90 tablet, Rfl: 3   NAPROXEN PO, Naproxen (Patient not taking: Reported on 09/24/2021), Disp: , Rfl:    nitroGLYCERIN (NITROSTAT) 0.4 MG SL tablet, Place 1 tablet (0.4 mg total) under the tongue every 5 (five) minutes x 3 doses as needed for chest pain., Disp: 25 tablet, Rfl: 7   RELION PEN NEEDLE 31G/8MM 31G X 8 MM MISC, USE AS DIRECTED, Disp: 100 each, Rfl: 0   rosuvastatin (CRESTOR) 20 MG tablet, Take 1 tablet by mouth once daily, Disp: 90 tablet, Rfl: 0   TRULICITY 3 MI/6.8EH SOPN, INJECT 3 MG INTO THE SKIN ONCE A WEEK, Disp: 24 mL, Rfl: 0   XIIDRA 5 % SOLN, Place 2 drops into both eyes 2 (two) times daily. , Disp: , Rfl:    No Known Allergies   Review of Systems  Constitutional: Negative.  Negative for fatigue.  Respiratory: Negative.  Negative for shortness of breath.   Cardiovascular: Negative.  Negative for chest pain.  Gastrointestinal: Negative.   Endocrine: Negative for polydipsia, polyphagia and polyuria.  Neurological: Negative.  Negative for headaches.  Psychiatric/Behavioral: Negative.      Today's Vitals   10/01/21 0929  BP: 128/70  Pulse: 78  Temp: 97.8 F (36.6 C)  TempSrc: Oral  Weight: 149 lb (67.6 kg)  Height: 5' 2.4" (1.585 m)   Body mass index is 26.9 kg/m.   Objective:  Physical Exam Vitals reviewed.  Constitutional:      General: She is not in acute distress.    Appearance: Normal appearance. She is well-developed.   HENT:     Head: Normocephalic and atraumatic.     Right Ear: Hearing normal.     Left Ear: Hearing normal.  Eyes:     General: Lids are normal.     Extraocular Movements: Extraocular movements intact.     Pupils: Pupils are equal, round, and reactive to light.     Funduscopic exam:    Right eye: No papilledema.        Left eye: No papilledema.  Neck:     Thyroid: No thyroid mass.  Cardiovascular:     Rate and Rhythm: Normal rate and regular rhythm.     Pulses: Normal pulses.     Heart sounds: Normal heart sounds. No murmur heard. Pulmonary:     Effort: Pulmonary effort is normal. No respiratory distress.     Breath sounds: Normal breath sounds.  No wheezing.  Chest:     Chest wall: No mass.  Breasts:    Right: Normal. No mass or tenderness.     Left: Normal. No mass or tenderness.  Musculoskeletal:     Cervical back: Full passive range of motion without pain.     Right lower leg: No edema.     Left lower leg: No edema.  Lymphadenopathy:     Upper Body:     Right upper body: No supraclavicular, axillary or pectoral adenopathy.     Left upper body: No supraclavicular, axillary or pectoral adenopathy.  Skin:    General: Skin is warm and dry.     Capillary Refill: Capillary refill takes less than 2 seconds.  Neurological:     General: No focal deficit present.     Mental Status: She is alert and oriented to person, place, and time.     Cranial Nerves: No cranial nerve deficit.     Sensory: No sensory deficit.     Motor: No weakness.  Psychiatric:        Mood and Affect: Mood normal.        Behavior: Behavior normal.        Thought Content: Thought content normal.        Judgment: Judgment normal.        Assessment And Plan:     1. Type 2 diabetes mellitus with complication, with long-term current use of insulin (HCC) Comments: She has not had Trulicity for 6 weeks will give sample until able to see why not able to get at the pharmacy.  - Hemoglobin A1c -  CMP14+EGFR  2. Primary hypertension Comments: Blood pressure is well controlled, continue current medications - CMP14+EGFR  3. Acquired hypothyroidism Comments: Will add thyroid studies to her labs tomorroe.   4. Hyperlipidemia, unspecified hyperlipidemia type Comments: Cholesterol levels are normal, continue current medications  5. Hx of NSTEMI 10/19 Tx with DES to pRCA Comments: She is no longer on Plavix, now on ASA 325 mg     Patient was given opportunity to ask questions. Patient verbalized understanding of the plan and was able to repeat key elements of the plan. All questions were answered to their satisfaction.  Madeline Brine, FNP   I, Madeline Brine, FNP, have reviewed all documentation for this visit. The documentation on 10/01/21 for the exam, diagnosis, procedures, and orders are all accurate and complete.   IF YOU HAVE BEEN REFERRED TO A SPECIALIST, IT MAY TAKE 1-2 WEEKS TO SCHEDULE/PROCESS THE REFERRAL. IF YOU HAVE NOT HEARD FROM US/SPECIALIST IN TWO WEEKS, PLEASE GIVE [place] A CALL AT [phone] X [phone].   THE PATIENT IS ENCOURAGED TO PRACTICE SOCIAL DISTANCING DUE TO THE COVID-19 PANDEMIC.

## 2021-10-01 NOTE — Patient Instructions (Signed)

## 2021-10-03 ENCOUNTER — Ambulatory Visit: Payer: Medicare Other | Admitting: Cardiovascular Disease

## 2021-10-04 LAB — CMP14+EGFR
ALT: 22 IU/L (ref 0–32)
AST: 22 IU/L (ref 0–40)
Albumin/Globulin Ratio: 1.2 (ref 1.2–2.2)
Albumin: 4.2 g/dL (ref 3.6–4.6)
Alkaline Phosphatase: 84 IU/L (ref 44–121)
BUN/Creatinine Ratio: 11 — ABNORMAL LOW (ref 12–28)
BUN: 14 mg/dL (ref 8–27)
Bilirubin Total: 0.6 mg/dL (ref 0.0–1.2)
CO2: 23 mmol/L (ref 20–29)
Calcium: 10.2 mg/dL (ref 8.7–10.3)
Chloride: 107 mmol/L — ABNORMAL HIGH (ref 96–106)
Creatinine, Ser: 1.3 mg/dL — ABNORMAL HIGH (ref 0.57–1.00)
Globulin, Total: 3.4 g/dL (ref 1.5–4.5)
Glucose: 160 mg/dL — ABNORMAL HIGH (ref 70–99)
Potassium: 5.1 mmol/L (ref 3.5–5.2)
Sodium: 143 mmol/L (ref 134–144)
Total Protein: 7.6 g/dL (ref 6.0–8.5)
eGFR: 41 mL/min/{1.73_m2} — ABNORMAL LOW (ref >59)

## 2021-10-04 LAB — HEMOGLOBIN A1C
Est. average glucose Bld gHb Est-mCnc: 226 mg/dL
Hgb A1c MFr Bld: 9.5 % — ABNORMAL HIGH (ref 4.8–5.6)

## 2021-10-13 ENCOUNTER — Other Ambulatory Visit: Payer: Self-pay | Admitting: Nurse Practitioner

## 2021-10-27 ENCOUNTER — Other Ambulatory Visit: Payer: Self-pay | Admitting: Nurse Practitioner

## 2021-10-28 ENCOUNTER — Other Ambulatory Visit: Payer: Self-pay

## 2021-10-28 MED ORDER — TRULICITY 3 MG/0.5ML ~~LOC~~ SOAJ: SUBCUTANEOUS | 0 refills | Status: DC

## 2021-11-12 ENCOUNTER — Other Ambulatory Visit: Payer: Self-pay | Admitting: Nurse Practitioner

## 2021-11-12 ENCOUNTER — Other Ambulatory Visit: Payer: Self-pay | Admitting: Cardiovascular Disease

## 2021-11-12 DIAGNOSIS — I2510 Atherosclerotic heart disease of native coronary artery without angina pectoris: Secondary | ICD-10-CM

## 2021-11-13 ENCOUNTER — Other Ambulatory Visit: Payer: Self-pay

## 2021-11-27 ENCOUNTER — Other Ambulatory Visit: Payer: Self-pay | Admitting: Nurse Practitioner

## 2021-11-30 ENCOUNTER — Other Ambulatory Visit: Payer: Self-pay | Admitting: Nurse Practitioner

## 2021-12-08 ENCOUNTER — Ambulatory Visit (INDEPENDENT_AMBULATORY_CARE_PROVIDER_SITE_OTHER): Payer: Medicare Other | Admitting: Podiatry

## 2021-12-08 ENCOUNTER — Encounter: Payer: Self-pay | Admitting: Podiatry

## 2021-12-08 DIAGNOSIS — M79674 Pain in right toe(s): Secondary | ICD-10-CM | POA: Diagnosis not present

## 2021-12-08 DIAGNOSIS — E1142 Type 2 diabetes mellitus with diabetic polyneuropathy: Secondary | ICD-10-CM

## 2021-12-08 DIAGNOSIS — M79675 Pain in left toe(s): Secondary | ICD-10-CM

## 2021-12-08 DIAGNOSIS — B351 Tinea unguium: Secondary | ICD-10-CM

## 2021-12-10 ENCOUNTER — Other Ambulatory Visit: Payer: Self-pay | Admitting: Nurse Practitioner

## 2021-12-10 ENCOUNTER — Other Ambulatory Visit: Payer: Self-pay

## 2021-12-14 NOTE — Progress Notes (Signed)
?  Subjective:  ?Patient ID: [name], female    DOB: 06/22/1935,  MRN: [id number] ? ?[name] presents to clinic today for at risk foot care with history of diabetic neuropathy and painful elongated mycotic toenails 1-5 bilaterally which are tender when wearing enclosed shoe gear. Pain is relieved with periodic professional debridement. ? ?Patient states blood glucose was 126 mg/dl today.   ? ?New problem(s): None.  ? ?PCP is [name], FNP , and last visit was October 01, 2021. ? ?No Known Allergies ? ?Review of Systems: Negative except as noted in the HPI. ? ?Objective: No changes noted in today's physical examination. ?[name] is a pleasant 87 y.o. female in NAD. AAO X 3. ? ?Vascular Examination: ?CFT immediate b/l LE. Palpable DP/PT pulses b/l LE. Digital hair present b/l. Skin temperature gradient WNL b/l. No pain with calf compression b/l. No edema noted b/l. No cyanosis or clubbing noted b/l LE. ? ?Dermatological Examination: ?Pedal integument with normal turgor, texture and tone b/l LE. No open wounds b/l. No interdigital macerations b/l. Toenails 1-5 b/l elongated, thickened, discolored with subungual debris. +Tenderness with dorsal palpation of nailplates. No hyperkeratotic or porokeratotic lesions present. ? ?Musculoskeletal Examination: ?Normal muscle strength 5/5 to all lower extremity muscle groups bilaterally. HAV with bunion deformity noted b/l LE. Pes planus deformity noted bilateral LE. No pain, crepitus or joint limitation noted with ROM b/l LE.  Patient ambulates independently without assistive aids. ? ?Neurological Examination: ?Protective sensation intact 5/5 intact bilaterally with 10g monofilament b/l. Vibratory sensation decreased b/l. Clonus negative b/l. ? ? ?  Latest Ref Rng & Units 10/01/2021  ?  9:39 AM 03/17/2021  ?  9:44 AM  ?Hemoglobin A1C  ?Hemoglobin-A1c 4.8 - 5.6 % 9.5   9.1    ? ?Assessment/Plan: ?1. Pain due to onychomycosis of toenails of both  feet   ?2. Diabetic peripheral neuropathy associated with type 2 diabetes mellitus ([place])   ?  ?-No new findings. No new orders. ?-Patient to continue soft, supportive shoe gear daily. ?-Toenails 1-5 b/l were debrided in length and girth with sterile nail nippers and dremel without iatrogenic bleeding.  ?-Patient/POA to call should there be question/concern in the interim.  ? ?Return in about 3 months (around 03/09/2022). ? ?Marzetta Board, DPM  ?

## 2021-12-15 ENCOUNTER — Other Ambulatory Visit: Payer: Self-pay | Admitting: Nurse Practitioner

## 2021-12-18 ENCOUNTER — Encounter: Payer: Self-pay | Admitting: Nurse Practitioner

## 2021-12-18 DIAGNOSIS — N1831 Chronic kidney disease, stage 3a: Secondary | ICD-10-CM | POA: Diagnosis not present

## 2021-12-18 DIAGNOSIS — E1122 Type 2 diabetes mellitus with diabetic chronic kidney disease: Secondary | ICD-10-CM | POA: Diagnosis not present

## 2021-12-18 DIAGNOSIS — H8110 Benign paroxysmal vertigo, unspecified ear: Secondary | ICD-10-CM | POA: Diagnosis not present

## 2021-12-18 DIAGNOSIS — I129 Hypertensive chronic kidney disease with stage 1 through stage 4 chronic kidney disease, or unspecified chronic kidney disease: Secondary | ICD-10-CM | POA: Diagnosis not present

## 2021-12-18 DIAGNOSIS — D479 Neoplasm of uncertain behavior of lymphoid, hematopoietic and related tissue, unspecified: Secondary | ICD-10-CM | POA: Diagnosis not present

## 2021-12-26 ENCOUNTER — Ambulatory Visit: Payer: Medicare Other | Attending: Nephrology | Admitting: Physical Therapy

## 2021-12-26 ENCOUNTER — Encounter: Payer: Self-pay | Admitting: Physical Therapy

## 2021-12-26 ENCOUNTER — Other Ambulatory Visit: Payer: Self-pay

## 2021-12-26 DIAGNOSIS — R2681 Unsteadiness on feet: Secondary | ICD-10-CM | POA: Insufficient documentation

## 2021-12-26 DIAGNOSIS — R42 Dizziness and giddiness: Secondary | ICD-10-CM | POA: Insufficient documentation

## 2021-12-26 NOTE — Therapy (Signed)
?OUTPATIENT PHYSICAL THERAPY VESTIBULAR EVALUATION ? ? ? ? ?Patient Name: [name] ?MRN: [id number] ?DOB:Apr 16, 1935, 87 y.o., female ?Today's Date: 12/26/2021 ? ?PCP: [name], FNP ?REFERRING PROVIDER: Madelon Lips, MD  ? ? PT End of Session - 12/26/21 1109   ? ? Visit Number 1   ? Number of Visits 13   ? Date for PT Re-Evaluation 02/06/22   ? Authorization Type [employer] Medicare   ? PT Start Time 1104   ? PT Stop Time 1150   ? PT Time Calculation (min) 46 min   ? Activity Tolerance Patient tolerated treatment well   ? Behavior During Therapy [facility] for tasks assessed/performed   ? ?  ?  ? ?  ? ? ?Past Medical History:  ?Diagnosis Date  ? Arthritis   ? Asthma   ? CAD (coronary artery disease)   ? s/p NSTEMI 10/19 - LHC:  pRCA 99, oLCx 30 >> DES to RCA; Echo 10/19: Mild focal basal septal hypertrophy, EF 60-65, inf-lat, inf and inf-sept HK, trivial MR, trivial TR  ? Diabetes mellitus without complication ([place])   ? DKA (diabetic ketoacidoses) 08/31/2019  ? Hypertension   ? ?Past Surgical History:  ?Procedure Laterality Date  ? ABDOMINAL HYSTERECTOMY  "many yrs ago"  ? AXILLARY LYMPH NODE BIOPSY Right 02/26/2015  ? Procedure: RIGHT AXILLARY LYMPH NODE BIOPSY;  Surgeon: Jackolyn Confer, MD;  Location: [facility];  Service: General;  Laterality: Right;  ? CHOLECYSTECTOMY    ? CORONARY STENT INTERVENTION N/A 06/15/2018  ? Procedure: CORONARY STENT INTERVENTION;  Surgeon: Wellington Hampshire, MD;  Location: [facility];  Service: Cardiovascular;  Laterality: N/A;  ? LEFT HEART CATH AND CORONARY ANGIOGRAPHY N/A 06/15/2018  ? Procedure: LEFT HEART CATH AND CORONARY ANGIOGRAPHY;  Surgeon: Wellington Hampshire, MD;  Location: [facility];  Service: Cardiovascular;  Laterality: N/A;  ? ?Patient Active Problem List  ? Diagnosis Date Noted  ? Screening for malignant neoplasm of colon 10/22/2020  ? Gastroesophageal reflux disease 10/22/2020  ? Acquired hypothyroidism 03/12/2020  ? COVID-19 virus infection 08/31/2019  ? AKI  (acute kidney injury) ([place]) 08/31/2019  ? CAD (coronary artery disease) 09/27/2018  ? Hypertension 06/16/2018  ? Hyperlipidemia 06/16/2018  ? Type 2 diabetes mellitus with complication, with long-term current use of insulin ([place]) 06/16/2018  ? Hx of NSTEMI 10/19 Tx with DES to [facility] 06/14/2018  ? Lymphoproliferative disease ([place]) 10/03/2015  ? ? ?ONSET DATE: 12/26/2021 (referral ? ?REFERRING DIAG: H81.10 (ICD-10-CM) - BPPV (benign paroxysmal positional vertigo)  ? ?THERAPY DIAG:  ?Dizziness and giddiness ? ?Unsteadiness on feet ? ?SUBJECTIVE:  ? ?SUBJECTIVE STATEMENT: ?I have vertigo and I fall.  Kidney doctor suggested that I come to therapy.  I have medication (Meclizine) for it, and in the past 3 months, I've had to take it everyday. ?Pt accompanied by: self ? ?PERTINENT HISTORY: Have had dizziness for about 3 years.   ? ? ?PAIN:  ?Are you having pain? No ? ?PRECAUTIONS: Fall ? ? ?FALLS: Has patient fallen in last 6 months? Yes. Number of falls several.  I can feel the dizziness come on and then fall.  Able to get up from the floor on my own.   Last fall was Tuesday.  ? ?LIVING ENVIRONMENT: ?Lives with: lives with their family and lives alone ?Lives in: House/apartment ?Stairs: Yes: External: 4 steps; none  Has upstairs at home, but doesn't go upstairs. ?Has following equipment at home: None ? ?PLOF: Independent ? ?PATIENT GOALS Want  to help with the dizziness. ? ?OBJECTIVE:  ? ? ?Cervical ROM:   ? ?Active A/PROM (deg) ?12/26/2021  ?Flexion WFL  ?Extension WFL  ?Right lateral flexion   ?Left lateral flexion   ?Right rotation WFL  ?Left rotation WFL  ?(Blank rows = not tested) ? ? ? ? ? ?PATIENT SURVEYS:  ?NA ? ? ?VESTIBULAR ASSESSMENT ? ? GENERAL OBSERVATION: Reports dizziness at times.  No c c/o during eval. ?  ? SYMPTOM BEHAVIOR: ? Subjective history: Worsening dizziness over the past several months.  Probably been going on for at least 3 years. Denies illness, virus, fall with hitting head. ?  Non-Vestibular  symptoms:  NA ?Type of dizziness: Spinning/Vertigo and Unsteady with head/body turns ?Frequency: Almost daily, mostly in mornings when getting up ?  Duration: Seconds ?  Aggravating factors: Induced by position change: rolling to the right and Induced by motion: bending down to the ground, turning body quickly, and turning head quickly ?  Relieving factors: head stationary and slow movements ?  Progression of symptoms: unchanged ? ? OCULOMOTOR EXAM: ?  Ocular Alignment: abnormal and L eye hypertropic ?  Ocular ROM: No Limitations ?Spontaneous Nystagmus:  at end range left side, pt c/o dizziness ?  Gaze-Induced Nystagmus:  Pt c/o increased dizziness, difficult to complete, as pt closes eyes. ?  Smooth Pursuits: intact for first rep, then quick movement to target, as if pt is anticipating. ?Saccades:  c/o increase dizziness to right and left (rates 3/10) ?  Convergence/Divergence: NA ? ?  ? ? VESTIBULAR - OCULAR REFLEX:  ?  Slow VOR: Positive Bilaterally and Comment: increased c/o dizziness ?  VOR Cancellation: Comment: Unable to maintain gaze and c/o woozy feeling (reports 6/10 dizziness) ?  Head-Impulse Test: NT ?  Dynamic Visual Acuity: Not able to be assessed ?Pt reports all letters are blurry; she normally wears glasses and doesn't have them today. ?  ? POSITIONAL TESTING: Right Dix-Hallpike: none and pt c/o wooziness; Duration:<10 seconds ?Left Dix-Hallpike: none; Duration: approx 30 sec ?Right Roll Test: none; Duration: none ?Left Roll Test: none; Duration: none ?  ? ? ? ? ?PATIENT EDUCATION: ?Education details: Educated pt on mechanisms of positional vertigo and oculomotor testing.  Discussed Meclizine and potential of nystagmus suppression with positional testing.  Discussed POC and goals. ?Person educated: Patient ?Education method: Explanation ?Education comprehension: verbalized understanding ? ? ?GOALS: ?Goals reviewed with patient? Yes ? ?SHORT TERM GOALS: Target date: 01/09/2022 ? ?Pt will be independent  with exercises to decrease dizziness symptoms. ?Baseline: ?Goal status: INITIAL ? ?2.  Pt will report improvement in dizziness symptoms with functional mobility by at least 50%. ?Baseline:  ?Goal status: INITIAL ? ? ? ?LONG TERM GOALS: Target date: 02/06/2022 ? ?Pt will be independent with exercises to decrease dizziness symptoms and improve overall functional mobility. ?Baseline:  ?Goal status: INITIAL ? ?2.  Pt will perform Brant-Daroff exercises with no increase in dizziness symptoms for improved overall mobility. ?Baseline:  ?Goal status: INITIAL ? ?3.  DGI to be assessed with pt to improve DGI score by at least 4 points for decreased  ?Baseline:  ?Goal status: INITIAL ? ?4.  Pt will perform bed mobility without c/o vertigo. ?Baseline:  ?Goal status: INITIAL ? ? ? ?ASSESSMENT: ? ?CLINICAL IMPRESSION: ?Patient is an 87 y.o. female who was seen today for physical therapy evaluation and treatment for BPPV.  Pt has reported 3 month worsening of dizziness symptoms.  She  takes Meclizine daily for symptoms.  Pt c/o symptoms  as room spinning, but also as wooziness and lightheadness, primarily with bed mobility rolling to right, but also with bending over, quick movements and turns.  Pt reports increase in wooziness symptoms with oculomotor testing and with L and R Dix-Hallpike (no nystagmus noted).  If pt's Meclizine has been prescribed as needed, requested pt come to therapy (if she feels she can safely) without it, to do so for optimal assessment of positional vertigo. ? ? ?OBJECTIVE IMPAIRMENTS decreased balance, decreased mobility, difficulty walking, and dizziness.  ? ?ACTIVITY LIMITATIONS cleaning, community activity, meal prep, and walking in community .  ? ?PERSONAL FACTORS arthritis, CAD, DM, HTN are also affecting patient's functional outcome.  ? ? ?REHAB POTENTIAL: Good ? ?CLINICAL DECISION MAKING: Evolving/moderate complexity ? ?EVALUATION COMPLEXITY: Moderate ? ? ?PLAN: ?PT FREQUENCY: 2x/week ? ?PT  DURATION: 6 weeks ? ?PLANNED INTERVENTIONS: Therapeutic exercises, Therapeutic activity, Neuromuscular re-education, Balance training, Gait training, Patient/Family education, Joint mobilization, Vestibular train

## 2021-12-29 ENCOUNTER — Encounter: Payer: Self-pay | Admitting: Physical Therapy

## 2021-12-29 ENCOUNTER — Ambulatory Visit: Payer: Medicare Other | Admitting: Physical Therapy

## 2021-12-29 ENCOUNTER — Other Ambulatory Visit: Payer: Self-pay | Admitting: Nurse Practitioner

## 2021-12-29 DIAGNOSIS — R2681 Unsteadiness on feet: Secondary | ICD-10-CM

## 2021-12-29 DIAGNOSIS — R42 Dizziness and giddiness: Secondary | ICD-10-CM

## 2021-12-29 DIAGNOSIS — Z794 Long term (current) use of insulin: Secondary | ICD-10-CM

## 2021-12-29 NOTE — Patient Instructions (Signed)
Access Code: [email or web address] ?URL: [email or web address] ?Date: 12/29/2021 ?Prepared by: [facility] ? ?Program Notes ?For safety:  Plan to have your phone with you.  Use the restroom prior to exercises.  Know that you can sit for 10-15 minutes after the exercise to let things settle down. ? ?Exercises ?- Brandt-Daroff Vestibular Exercise  - 1 x daily - 7 x weekly - 3 sets - 10 reps ?

## 2021-12-29 NOTE — Therapy (Signed)
?OUTPATIENT PHYSICAL THERAPY TREATMENT NOTE ? ? ?Patient Name: [name] ?MRN: [id number] ?DOB:1934-11-25, 87 y.o., female ?Today's Date: 12/29/2021 ? ?PCP: [name], FNP ?REFERRING PROVIDER: Madelon Lips, MD  ? ? PT End of Session - 12/29/21 1702   ? ? Visit Number 2   ? Number of Visits 13   ? Date for PT Re-Evaluation 02/06/22   ? Authorization Type UHC Medicare   ? PT Start Time [phone]   ? PT Stop Time 1014   ? PT Time Calculation (min) 38 min   ? Activity Tolerance Patient tolerated treatment well   ? Behavior During Therapy [facility] for tasks assessed/performed   ? ?  ?  ? ?  ? ? ?Past Medical History:  ?Diagnosis Date  ? Arthritis   ? Asthma   ? CAD (coronary artery disease)   ? s/p NSTEMI 10/19 - LHC:  pRCA 99, oLCx 30 >> DES to RCA; Echo 10/19: Mild focal basal septal hypertrophy, EF 60-65, inf-lat, inf and inf-sept HK, trivial MR, trivial TR  ? Diabetes mellitus without complication ([place])   ? DKA (diabetic ketoacidoses) 08/31/2019  ? Hypertension   ? ?Past Surgical History:  ?Procedure Laterality Date  ? ABDOMINAL HYSTERECTOMY  "many yrs ago"  ? AXILLARY LYMPH NODE BIOPSY Right 02/26/2015  ? Procedure: RIGHT AXILLARY LYMPH NODE BIOPSY;  Surgeon: Jackolyn Confer, MD;  [facility];  Service: General;  Laterality: Right;  ? CHOLECYSTECTOMY    ? CORONARY STENT INTERVENTION N/A 06/15/2018  ? Procedure: CORONARY STENT INTERVENTION;  Surgeon: Wellington Hampshire, MD;  Location: [place] [facility];  Service: Cardiovascular;  Laterality: N/A;  ? LEFT HEART CATH AND CORONARY ANGIOGRAPHY N/A 06/15/2018  ? Procedure: LEFT HEART CATH AND CORONARY ANGIOGRAPHY;  Surgeon: Wellington Hampshire, MD;  Location: [place] [facility];  Service: Cardiovascular;  Laterality: N/A;  ? ?Patient Active Problem List  ? Diagnosis Date Noted  ? Screening for malignant neoplasm of colon 10/22/2020  ? Gastroesophageal reflux disease 10/22/2020  ? Acquired hypothyroidism 03/12/2020  ? COVID-19 virus infection 08/31/2019  ? AKI (acute  kidney injury) ([place]) 08/31/2019  ? CAD (coronary artery disease) 09/27/2018  ? Hypertension 06/16/2018  ? Hyperlipidemia 06/16/2018  ? Type 2 diabetes mellitus with complication, with long-term current use of insulin ([place]) 06/16/2018  ? Hx of NSTEMI 10/19 Tx with DES to [facility] 06/14/2018  ? Lymphoproliferative disease ([place]) 10/03/2015  ? ? ?REFERRING DIAG: H81.10 (ICD-10-CM) - BPPV (benign paroxysmal positional vertigo)  ? ?THERAPY DIAG:  ?Dizziness and giddiness ? ?Unsteadiness on feet ? ?PERTINENT HISTORY: PERTINENT HISTORY: Have had dizziness for about 3 years. ? ?PRECAUTIONS: Fall ? ?SUBJECTIVE: No c/o dizziness upon arrival to PT today; did not take Meclizine over the weekend or today. ? ?PAIN:  ?Are you having pain? No ? ? ? ? ?TODAY'S TREATMENT:  ? Vestibular Assessment - 12/29/21 0001   ? ?  ? Positional Testing  ? Dix-Hallpike Dix-Hallpike Right;Dix-Hallpike Left   ? Sidelying Test Sidelying Right;Sidelying Left   ? Horizontal Canal Testing Horizontal Canal Right;Horizontal Canal Left   ?  ? Dix-Hallpike Right  ? Dix-Hallpike Right Duration 60 sec   plu  ? Dix-Hallpike Right Symptoms Other (comment)   dizziness, spinning, no nystagmus noted  ?  ? Dix-Hallpike Left  ? Dix-Hallpike Left Duration 25 sec   ? Dix-Hallpike Left Symptoms No nystagmus   closes her eyes; c/o dizizness, spinny  ?  ? Sidelying Right  ? Sidelying Right Duration 5 sec   ?  Sidelying Right Symptoms No nystagmus   rates dizziness 3/10; also reports dizziness upon sitting  ?  ? Sidelying Left  ? Sidelying Left Duration 10 sec   ? Sidelying Left Symptoms --   reports symptoms 4/10  ?  ? Horizontal Canal Right  ? Horizontal Canal Right Duration 10   ? Horizontal Canal Right Symptoms Normal   dizziness  ?  ? Horizontal Canal Left  ? Horizontal Canal Left Duration 5   ? Horizontal Canal Left Symptoms Normal   ? ?  ?  ? ?  ?  ? Vestibular Treatment/Exercise - 12/29/21 0001   ? ?  ? Vestibular Treatment/Exercise  ? Habituation Exercises Nestor Lewandowsky   ?  ? Laruth Bouchard Daroff  ? Number of Reps  2   ? Symptom Description  To R:  notes 3/10 symptoms of spinning first rep, 2nd rep, no symptoms; reports unsteadiness upon return to sit, both reps, resolves in 10 sec or less.  To L:  notes 4/10 symptoms 1st rep, 2nd rep no symptoms.  Upon return to sit:  no c/o symptoms   ? ?  ?  ? ?  ?  ? [place] Adult PT Treatment/Exercise - 12/29/21 0001   ? ?  ? Standardized Balance Assessment  ? Standardized Balance Assessment Dynamic Gait Index   ?  ? Dynamic Gait Index  ? Level Surface Mild Impairment   ? Change in Gait Speed Mild Impairment   ? Gait with Horizontal Head Turns Moderate Impairment   ? Gait with Vertical Head Turns Moderate Impairment   ? Gait and Pivot Turn Mild Impairment   ? Step Over Obstacle Mild Impairment   ? Step Around Obstacles Mild Impairment   ? Steps Moderate Impairment   ? Total Score 13   ? ?  ?  ? ?  ? ?Scores <19/24 indicate increased fall risk. ? ? ?PATIENT EDUCATION: ?Education details: Educated in [place], Brandt-Daroff, habituation ?Person educated: Patient ?Education method: Explanation, Demonstration, and Handouts ?Education comprehension: verbalized understanding and returned demonstration ? ? ?HOME EXERCISE PROGRAM: ?[place] Access Code:  [place] ? ? ?GOALS: ?Goals reviewed with patient? Yes ?  ?SHORT TERM GOALS: Target date: 01/09/2022 ?  ?Pt will be independent with exercises to decrease dizziness symptoms. ?Baseline: ?Goal status: ONGOING ?  ?2.  Pt will report improvement in dizziness symptoms with functional mobility by at least 50%. ?Baseline:  ?Goal status: ONGOING ?  ?  ?  ?LONG TERM GOALS: Target date: 02/06/2022 ?  ?Pt will be independent with exercises to decrease dizziness symptoms and improve overall functional mobility. ?Baseline:  ?Goal status: ONGOING ?  ?2.  Pt will perform Brant-Daroff exercises with no increase in dizziness symptoms for improved overall mobility. ?Baseline:  ?Goal status: ONGOING ?  ?3.  DGI to be assessed  with pt to improve DGI score by at least 4 points for decreased  ?Baseline: 13/24 12/29/2021 ?Goal status: ONGOING ?  ?4.  Pt will perform bed mobility without c/o vertigo. ?Baseline:  ?Goal status: ONGOING ?  ?  ?  ?ASSESSMENT: ?  ?CLINICAL IMPRESSION: ?Pt returns for second visit today, with no c/o dizziness, feeling better since last visit and not having taken Meclizine.  Assessed for positional vertigo in Dix-Hallpike and horizontal roll positions, with all positions evoking a sense of dizziness for short duration, but no nystagmus noted.  Treatment progressed today to habituation with Brandt-Daroff motions, with pt reporting improvement in symptoms from first to second reps.  Assessed DGI  today,with score of 13/24 indicating increased fall risk. She will continue to benefit from skilled PT to address dizziness and balance symptoms. ?  ?  ?  ?  ?PLAN: ?PT FREQUENCY: 2x/week ?  ?PT DURATION: 6 weeks ?  ?PLANNED INTERVENTIONS: Therapeutic exercises, Therapeutic activity, Neuromuscular re-education, Balance training, Gait training, Patient/Family education, Joint mobilization, Vestibular training, and Canalith repositioning ?  ?PLAN FOR NEXT SESSION: Reassess positional testing as needed; reassess Brandt-Daroff for motion sensitivity and progress to rolling as able.  Review HEP; progress to balance exercises when appropriate. ?  ? ? ? ? ? ? ?Ethel Veronica W., PT ?12/29/2021, 5:04 PM ? ?  ? ?

## 2021-12-30 ENCOUNTER — Ambulatory Visit (INDEPENDENT_AMBULATORY_CARE_PROVIDER_SITE_OTHER): Payer: Medicare Other | Admitting: Nurse Practitioner

## 2021-12-30 ENCOUNTER — Encounter: Payer: Self-pay | Admitting: Nurse Practitioner

## 2021-12-30 VITALS — BP 108/80 | HR 74 | Temp 97.5°F | Ht 62.0 in | Wt 149.2 lb

## 2021-12-30 DIAGNOSIS — I10 Essential (primary) hypertension: Secondary | ICD-10-CM

## 2021-12-30 DIAGNOSIS — E118 Type 2 diabetes mellitus with unspecified complications: Secondary | ICD-10-CM | POA: Diagnosis not present

## 2021-12-30 DIAGNOSIS — E785 Hyperlipidemia, unspecified: Secondary | ICD-10-CM | POA: Diagnosis not present

## 2021-12-30 DIAGNOSIS — N183 Chronic kidney disease, stage 3 unspecified: Secondary | ICD-10-CM

## 2021-12-30 DIAGNOSIS — I129 Hypertensive chronic kidney disease with stage 1 through stage 4 chronic kidney disease, or unspecified chronic kidney disease: Secondary | ICD-10-CM | POA: Diagnosis not present

## 2021-12-30 DIAGNOSIS — N1832 Chronic kidney disease, stage 3b: Secondary | ICD-10-CM

## 2021-12-30 DIAGNOSIS — R42 Dizziness and giddiness: Secondary | ICD-10-CM | POA: Diagnosis not present

## 2021-12-30 DIAGNOSIS — Z794 Long term (current) use of insulin: Secondary | ICD-10-CM

## 2021-12-30 DIAGNOSIS — E039 Hypothyroidism, unspecified: Secondary | ICD-10-CM | POA: Diagnosis not present

## 2021-12-30 NOTE — Progress Notes (Signed)
I,Victoria T Hamilton,acting as a Education administrator for [name], FNP.,have documented all relevant documentation on the behalf of [name], FNP,as directed by  [name], FNP while in the presence of [name], [place].  This visit occurred during the SARS-CoV-2 public health emergency.  Safety protocols were in place, including screening questions prior to the visit, additional usage of staff PPE, and extensive cleaning of exam room while observing appropriate contact time as indicated for disinfecting solutions.  Subjective:     Patient ID: [name] , female    DOB: 1935/01/26 , 87 y.o.   MRN: [id number]   Chief Complaint  Patient presents with   Hypertension   Diabetes    HPI  Pt presents for bp & dm check. She reports compliance with medications, no questions or concerns. She has started therapy for her vertigo she was taking meclizine almost daily, has not taken any since Friday after starting therapy. She had been out of her trulicity at her last visit. She walked this morning for an hour.   Hypertension This is a chronic problem. The current episode started more than 1 year ago. The problem has been gradually improving since onset. The problem is controlled. Pertinent negatives include no anxiety, chest pain, headaches, palpitations or shortness of breath. There are no associated agents to hypertension. Risk factors for coronary artery disease include diabetes mellitus and dyslipidemia. Past treatments include angiotensin blockers, calcium channel blockers and beta blockers. There are no compliance problems.  Hypertensive end-organ damage includes kidney disease and CAD/MI. There is no history of angina. Identifiable causes of hypertension include chronic renal disease and a thyroid problem.  Diabetes She presents for her follow-up diabetic visit. She has type 2 diabetes mellitus. Her disease course has been worsening. There are no hypoglycemic associated symptoms. Pertinent  negatives for hypoglycemia include no headaches. Pertinent negatives for diabetes include no chest pain, no fatigue, no polydipsia, no polyphagia and no polyuria. There are no hypoglycemic complications. There are no diabetic complications. Risk factors for coronary artery disease include diabetes mellitus. Current diabetic treatment includes oral agent (dual therapy). She is compliant with treatment all of the time. When asked about meal planning, she reported none. She participates in exercise daily. There is no change in her home blood glucose trend. (Blood sugar up to 125 ) She sees a podiatrist (Dr. Valentina Lucks - seen in August 2021).Eye exam is current (done December 2020).    Past Medical History:  Diagnosis Date   Arthritis    Asthma    CAD (coronary artery disease)    s/p NSTEMI 10/19 - LHC:  pRCA 99, oLCx 30 >> DES to RCA; Echo 10/19: Mild focal basal septal hypertrophy, EF 60-65, inf-lat, inf and inf-sept HK, trivial MR, trivial TR   Diabetes mellitus without complication (HCC)    DKA (diabetic ketoacidoses) 08/31/2019   Hypertension      Family History  Problem Relation Age of Onset   Diabetes Mother    Heart disease Mother    Diabetes Father    Diabetes Sister    Heart disease Sister    Diabetes Brother      Current Outpatient Medications:    Accu-Chek FastClix Lancets MISC, USE AS DIRECTED FOR DAILY TESTING, Disp: 102 each, Rfl: 0   ACCU-CHEK GUIDE test strip, USE TO CHECK BLOOD SUGAR ONCE DAILY, Disp: 100 each, Rfl: 0   acetaminophen (TYLENOL) 325 MG tablet, Take by mouth every 6 (six) hours as needed for mild pain., Disp: ,  Rfl:    amLODipine-olmesartan (AZOR) 5-20 MG tablet, TAKE 1 TABLET BY MOUTH ONCE DAILY IN THE MORNING, Disp: 90 tablet, Rfl: 0   ANORO ELLIPTA 62.5-25 MCG/ACT AEPB, Inhale 1 puff by mouth once daily, Disp: 60 each, Rfl: 0   aspirin EC 81 MG tablet, Take 1 tablet (81 mg total) by mouth daily. Swallow whole., Disp: 90 tablet, Rfl: 3   Cholecalciferol  (VITAMIN D3) 10 MCG (400 UNIT) tablet, Take 400 Units by mouth daily. Per patient taking 2000 iu, Disp: , Rfl:    Dulaglutide (TRULICITY) 3 JX/9.1YN SOPN, INJECT 3 MG INTO THE SKIN ONCE A WEEK, Disp: 24 mL, Rfl: 0   Ergocalciferol (VITAMIN D2 PO), , Disp: , Rfl:    FLUZONE HIGH-DOSE QUADRIVALENT 0.7 ML SUSY, , Disp: , Rfl:    Insulin Pen Needle (PEN NEEDLES 29GX1/2") 29G X 12MM MISC, , Disp: , Rfl:    JARDIANCE 10 MG TABS tablet, TAKE 1 TABLET BY MOUTH ONCE DAILY BEFORE BREAKFAST, Disp: 90 tablet, Rfl: 0   LANTUS SOLOSTAR 100 UNIT/ML Solostar Pen, INJECT 30 UNITS SUBCUTANEOUSLY ONCE DAILY, Disp: 15 mL, Rfl: 0   levothyroxine (SYNTHROID) 50 MCG tablet, Take 1 tablet by mouth once daily, Disp: 90 tablet, Rfl: 0   meclizine (ANTIVERT) 25 MG tablet, TAKE 1 TABLET BY MOUTH ONCE DAILY AS NEEDED FOR DIZZINESS, Disp: 30 tablet, Rfl: 0   metoprolol succinate (TOPROL-XL) 25 MG 24 hr tablet, Take 1 tablet by mouth once daily, Disp: 90 tablet, Rfl: 3   nitroGLYCERIN (NITROSTAT) 0.4 MG SL tablet, Place 1 tablet (0.4 mg total) under the tongue every 5 (five) minutes x 3 doses as needed for chest pain., Disp: 25 tablet, Rfl: 7   RELION PEN NEEDLE 31G/8MM 31G X 8 MM MISC, USE AS DIRECTED, Disp: 100 each, Rfl: 0   rosuvastatin (CRESTOR) 20 MG tablet, Take 1 tablet by mouth once daily, Disp: 90 tablet, Rfl: 3   XIIDRA 5 % SOLN, Place 2 drops into both eyes 2 (two) times daily. , Disp: , Rfl:    NAPROXEN PO, , Disp: , Rfl:    No Known Allergies   Review of Systems  Constitutional: Negative.  Negative for fatigue.  Respiratory: Negative.  Negative for shortness of breath.   Cardiovascular: Negative.  Negative for chest pain, palpitations and leg swelling.  Endocrine: Negative for polydipsia, polyphagia and polyuria.  Neurological: Negative.  Negative for headaches.  Psychiatric/Behavioral: Negative.      Today's Vitals   12/30/21 1026  BP: 108/80  Pulse: 74  Temp: (!) 97.5 F (36.4 C)  SpO2: 98%   Weight: 149 lb 3.2 oz (67.7 kg)  Height: $Remove'5\' 2"'HGKbkUo$  (1.575 m)  PainSc: 0-No pain   Body mass index is 27.29 kg/m.  Wt Readings from Last 3 Encounters:  12/30/21 149 lb 3.2 oz (67.7 kg)  10/01/21 149 lb (67.6 kg)  09/24/21 152 lb 3.2 oz (69 kg)    Objective:  Physical Exam Vitals reviewed.  Constitutional:      General: She is not in acute distress.    Appearance: Normal appearance. She is well-developed.  HENT:     Head: Normocephalic and atraumatic.     Right Ear: Hearing normal.     Left Ear: Hearing normal.  Eyes:     General: Lids are normal.     Extraocular Movements: Extraocular movements intact.     Pupils: Pupils are equal, round, and reactive to light.     Funduscopic exam:    Right  eye: No papilledema.        Left eye: No papilledema.  Neck:     Thyroid: No thyroid mass.  Cardiovascular:     Rate and Rhythm: Normal rate and regular rhythm.     Pulses: Normal pulses.     Heart sounds: Normal heart sounds. No murmur heard. Pulmonary:     Effort: Pulmonary effort is normal. No respiratory distress.     Breath sounds: Normal breath sounds. No wheezing.  Chest:     Chest wall: No mass.  Breasts:    Right: Normal. No mass or tenderness.     Left: Normal. No mass or tenderness.  Musculoskeletal:     Cervical back: Full passive range of motion without pain.     Right lower leg: No edema.     Left lower leg: No edema.  Lymphadenopathy:     Upper Body:     Right upper body: No supraclavicular, axillary or pectoral adenopathy.     Left upper body: No supraclavicular, axillary or pectoral adenopathy.  Skin:    General: Skin is warm and dry.     Capillary Refill: Capillary refill takes less than 2 seconds.  Neurological:     General: No focal deficit present.     Mental Status: She is alert and oriented to person, place, and time.     Cranial Nerves: No cranial nerve deficit.     Sensory: No sensory deficit.     Motor: No weakness.  Psychiatric:        Mood and  Affect: Mood normal.        Behavior: Behavior normal.        Thought Content: Thought content normal.        Judgment: Judgment normal.        Assessment And Plan:     1. Primary hypertension Comments: Blood pressure is well controlled, continue current medications - CMP14+EGFR  2. Type 2 diabetes mellitus with complication, with long-term current use of insulin (HCC) Comments: HgbA1c was elevatd at 9.5 at last visit, was out of Trulicity. Will recheck HgbA1c pending results if remains elevated will increase Trulicity. Medical release form completed for Dr Venetia Maxon for diabetic eye exam - Hemoglobin A1c  3. Stage 3b chronic kidney disease ([place]) Comments: Continue follow up with Nephrology  4. Acquired hypothyroidism Comments: Stable, continue current medications - TSH - T4 - T3, free  5. Hyperlipidemia, unspecified hyperlipidemia type Comments: Controlled, continue statin tolerating well - Lipid panel  6. Vertigo  Continue PT, reports as improving   Patient was given opportunity to ask questions. Patient verbalized understanding of the plan and was able to repeat key elements of the plan. All questions were answered to their satisfaction.  [name], FNP   I, [name], FNP, have reviewed all documentation for this visit. The documentation on 12/30/21 for the exam, diagnosis, procedures, and orders are all accurate and complete.   IF YOU HAVE BEEN REFERRED TO A SPECIALIST, IT MAY TAKE 1-2 WEEKS TO SCHEDULE/PROCESS THE REFERRAL. IF YOU HAVE NOT HEARD FROM US/SPECIALIST IN TWO WEEKS, PLEASE GIVE [place] A CALL AT [phone] X [phone].   THE PATIENT IS ENCOURAGED TO PRACTICE SOCIAL DISTANCING DUE TO THE COVID-19 PANDEMIC.

## 2021-12-31 LAB — CMP14+EGFR
ALT: 27 IU/L (ref 0–32)
AST: 25 IU/L (ref 0–40)
Albumin/Globulin Ratio: 1.2 (ref 1.2–2.2)
Albumin: 4.2 g/dL (ref 3.6–4.6)
Alkaline Phosphatase: 86 IU/L (ref 44–121)
BUN/Creatinine Ratio: 10 — ABNORMAL LOW (ref 12–28)
BUN: 13 mg/dL (ref 8–27)
Bilirubin Total: 0.8 mg/dL (ref 0.0–1.2)
CO2: 24 mmol/L (ref 20–29)
Calcium: 10 mg/dL (ref 8.7–10.3)
Chloride: 105 mmol/L (ref 96–106)
Creatinine, Ser: 1.3 mg/dL — ABNORMAL HIGH (ref 0.57–1.00)
Globulin, Total: 3.4 g/dL (ref 1.5–4.5)
Glucose: 99 mg/dL (ref 70–99)
Potassium: 5 mmol/L (ref 3.5–5.2)
Sodium: 140 mmol/L (ref 134–144)
Total Protein: 7.6 g/dL (ref 6.0–8.5)
eGFR: 41 mL/min/{1.73_m2} — ABNORMAL LOW (ref >59)

## 2021-12-31 LAB — LIPID PANEL
Chol/HDL Ratio: 2.4 ratio (ref 0.0–4.4)
Cholesterol, Total: 105 mg/dL (ref 100–199)
HDL: 44 mg/dL (ref >39)
LDL Chol Calc (NIH): 42 mg/dL (ref 0–99)
Triglycerides: 103 mg/dL (ref 0–149)
VLDL Cholesterol Cal: 19 mg/dL (ref 5–40)

## 2021-12-31 LAB — TSH: TSH: 1.41 u[IU]/mL (ref 0.450–4.500)

## 2021-12-31 LAB — HEMOGLOBIN A1C
Est. average glucose Bld gHb Est-mCnc: 235 mg/dL
Hgb A1c MFr Bld: 9.8 % — ABNORMAL HIGH (ref 4.8–5.6)

## 2021-12-31 LAB — T4: T4, Total: 9.2 ug/dL (ref 4.5–12.0)

## 2021-12-31 LAB — T3, FREE: T3, Free: 2.8 pg/mL (ref 2.0–4.4)

## 2022-01-01 ENCOUNTER — Other Ambulatory Visit: Payer: Self-pay | Admitting: Nurse Practitioner

## 2022-01-01 ENCOUNTER — Encounter: Payer: Self-pay | Admitting: Physical Therapy

## 2022-01-01 ENCOUNTER — Ambulatory Visit: Payer: Medicare Other | Admitting: Physical Therapy

## 2022-01-01 DIAGNOSIS — R42 Dizziness and giddiness: Secondary | ICD-10-CM | POA: Diagnosis not present

## 2022-01-01 DIAGNOSIS — R2681 Unsteadiness on feet: Secondary | ICD-10-CM

## 2022-01-01 NOTE — Therapy (Signed)
?OUTPATIENT PHYSICAL THERAPY TREATMENT NOTE ? ? ?Patient Name: [name] ?MRN: [id number] ?DOB:11/08/1934, 87 y.o., female ?Today's Date: 01/01/2022 ? ?PCP: [name], FNP ?REFERRING PROVIDER: Madelon Lips, MD  ? ? PT End of Session - 01/01/22 0803   ? ? Visit Number 3   ? Number of Visits 13   ? Date for PT Re-Evaluation 02/06/22   ? Authorization Type UHC Medicare   ? PT Start Time 0805   ? PT Stop Time [phone]   ? PT Time Calculation (min) 38 min   ? Activity Tolerance Patient tolerated treatment well   ? Behavior During Therapy [facility] for tasks assessed/performed   ? ?  ?  ? ?  ? ? ?Past Medical History:  ?Diagnosis Date  ? Arthritis   ? Asthma   ? CAD (coronary artery disease)   ? s/p NSTEMI 10/19 - LHC:  pRCA 99, oLCx 30 >> DES to RCA; Echo 10/19: Mild focal basal septal hypertrophy, EF 60-65, inf-lat, inf and inf-sept HK, trivial MR, trivial TR  ? Diabetes mellitus without complication ([place])   ? DKA (diabetic ketoacidoses) 08/31/2019  ? Hypertension   ? ?Past Surgical History:  ?Procedure Laterality Date  ? ABDOMINAL HYSTERECTOMY  "many yrs ago"  ? AXILLARY LYMPH NODE BIOPSY Right 02/26/2015  ? Procedure: RIGHT AXILLARY LYMPH NODE BIOPSY;  Surgeon: Jackolyn Confer, MD;  [facility];  Service: General;  Laterality: Right;  ? CHOLECYSTECTOMY    ? CORONARY STENT INTERVENTION N/A 06/15/2018  ? Procedure: CORONARY STENT INTERVENTION;  Surgeon: Wellington Hampshire, MD;  Location: [place] [facility];  Service: Cardiovascular;  Laterality: N/A;  ? LEFT HEART CATH AND CORONARY ANGIOGRAPHY N/A 06/15/2018  ? Procedure: LEFT HEART CATH AND CORONARY ANGIOGRAPHY;  Surgeon: Wellington Hampshire, MD;  Location: [place] [facility];  Service: Cardiovascular;  Laterality: N/A;  ? ?Patient Active Problem List  ? Diagnosis Date Noted  ? Screening for malignant neoplasm of colon 10/22/2020  ? Gastroesophageal reflux disease 10/22/2020  ? Acquired hypothyroidism 03/12/2020  ? COVID-19 virus infection 08/31/2019  ? AKI (acute  kidney injury) ([place]) 08/31/2019  ? CAD (coronary artery disease) 09/27/2018  ? Hypertension 06/16/2018  ? Hyperlipidemia 06/16/2018  ? Type 2 diabetes mellitus with complication, with long-term current use of insulin ([place]) 06/16/2018  ? Hx of NSTEMI 10/19 Tx with DES to [facility] 06/14/2018  ? Lymphoproliferative disease ([place]) 10/03/2015  ? ? ?REFERRING DIAG: H81.10 (ICD-10-CM) - BPPV (benign paroxysmal positional vertigo)  ? ?THERAPY DIAG:  ?Dizziness and giddiness ? ?Unsteadiness on feet ? ?PERTINENT HISTORY: PERTINENT HISTORY: Have had dizziness for about 3 years. ? ?PRECAUTIONS: Fall ? ?SUBJECTIVE: Been doing the exercises and they are going well.  Went to [employer] last night and looking at all the screens didn't bring on dizziness.  No dizziness at all. ? ?PAIN:  ?Are you having pain? No ? ? ? ? ?TODAY'S TREATMENT:  ? ? ?-Reviewed Brandt-Daroff exercise given last visit. ?-Standing balance exercises consisting of: ?-Standing on solid surface with feet apart, then together , then semi-tandem stance with eyes open with supervision for safety. ? -performed EO head turns x 5, head nods x 5; 2 sets ? -No c/o dizziness ? -performed EC in these positions, x 10 seconds ?-STanding feet apart on [place]: ?-EO head turns x 10, head nods x 10, 2 sets (1 episode of posterior LOB in parallel bars-needs UE to regain balance) ? -EC with head steady x 10 sec, 2 sets ?-Standing balance exercises  near countertop consisting of: ? -Walking forward with head turns 4 reps ? -Walking forward with head nods 4 reps ? -Sidestepping R and L with head turns,then head nods, 1 rep each direction ? -Tandem gait forward, x 2 reps with intermittent UE support ? ? ? ? ?  ? ? ? ?PATIENT EDUCATION: ?Education details: Educated in [place], Brandt-Daroff, habituation ?Person educated: Patient ?Education method: Explanation, Demonstration, and Handouts ?Education comprehension: verbalized understanding and returned demonstration ? ? ?HOME EXERCISE  PROGRAM: ?[place] Access Code:  [place] ? ? ?GOALS: ?Goals reviewed with patient? Yes ?  ?SHORT TERM GOALS: Target date: 01/09/2022 ?  ?Pt will be independent with exercises to decrease dizziness symptoms. ?Baseline: ?Goal status: ONGOING ?  ?2.  Pt will report improvement in dizziness symptoms with functional mobility by at least 50%. ?Baseline:  ?Goal status: ONGOING ?  ?  ?  ?LONG TERM GOALS: Target date: 02/06/2022 ?  ?Pt will be independent with exercises to decrease dizziness symptoms and improve overall functional mobility. ?Baseline:  ?Goal status: ONGOING ?  ?2.  Pt will perform Brant-Daroff exercises with no increase in dizziness symptoms for improved overall mobility. ?Baseline:  ?Goal status: ONGOING ?  ?3.  DGI to be assessed with pt to improve DGI score by at least 4 points for decreased  ?Baseline: 13/24 12/29/2021 ?Goal status: ONGOING ?  ?4.  Pt will perform bed mobility without c/o vertigo. ?Baseline:  ?Goal status: ONGOING ?  ?  ?  ?ASSESSMENT: ?  ?CLINICAL IMPRESSION: ?Pt continues to progress, with no c/o dizziness coming into session today.  Pt is performing Brandt-Daroff exercises at home without difficulty or symptoms.  Progressed HEP today to include dynamic walking with head motions.  Worked on compliant surfaces today with head motions with eyes open/closed; this is the only activity today that brought on feeling of unsteadiness and dizziness.  She will continue to benefit from skilled PT towards goals for improved overall functional mobility. ?  ?  ?  ?  ?PLAN: ?PT FREQUENCY: 2x/week ?  ?PT DURATION: 6 weeks ?  ?PLANNED INTERVENTIONS: Therapeutic exercises, Therapeutic activity, Neuromuscular re-education, Balance training, Gait training, Patient/Family education, Joint mobilization, Vestibular training, and Canalith repositioning ?  ?PLAN FOR NEXT SESSION: Review additions to HEP; progress balance exercises on compliant surfaces; may look at d/c next week if pt continues to do well. ?   ? ? ? ? ? ? ?Jajaira Ruis W., PT ?01/01/2022, 8:46 AM ? ?  ? ?

## 2022-01-01 NOTE — Patient Instructions (Signed)
Access Code: [email or web address] ?URL: [email or web address] ?Date: 01/01/2022 ?Prepared by: [facility] ? ?Program Notes ?For safety:  Plan to have your phone with you.  Use the restroom prior to exercises.  Know that you can sit for 10-15 minutes after the exercise to let things settle down. ? ?Exercises ?- Brandt-Daroff Vestibular Exercise  - 1 x daily - 7 x weekly - 3 sets - 10 reps ?- Forward Walking with Head Rotations  - 1-2 x daily - 7 x weekly - 1 sets - 3 reps ?- Sidestepping  - 1 x daily - 7 x weekly - 1 sets - 3 reps ?

## 2022-01-02 ENCOUNTER — Other Ambulatory Visit: Payer: Self-pay | Admitting: Nurse Practitioner

## 2022-01-02 MED ORDER — TRULICITY 4.5 MG/0.5ML ~~LOC~~ SOAJ: 4.5000 mg | SUBCUTANEOUS | 1 refills | Status: DC

## 2022-01-05 ENCOUNTER — Ambulatory Visit: Payer: Medicare Other | Admitting: Physical Therapy

## 2022-01-05 ENCOUNTER — Encounter: Payer: Self-pay | Admitting: Physical Therapy

## 2022-01-05 DIAGNOSIS — R2681 Unsteadiness on feet: Secondary | ICD-10-CM

## 2022-01-05 DIAGNOSIS — R42 Dizziness and giddiness: Secondary | ICD-10-CM | POA: Diagnosis not present

## 2022-01-05 NOTE — Patient Instructions (Signed)
Access Code: [email or web address] ?URL: [email or web address] ?Date: 01/05/2022 ?Prepared by: [facility] ? ?Program Notes ?For safety:  Plan to have your phone with you.  Use the restroom prior to exercises.  Know that you can sit for 10-15 minutes after the exercise to let things settle down. ? ?Exercises ?- Brandt-Daroff Vestibular Exercise  - 1 x daily - 7 x weekly - 3 sets - 10 reps ?- Forward Walking with Head Rotations  - 1-2 x daily - 7 x weekly - 1 sets - 3 reps ?- Sidestepping  - 1 x daily - 7 x weekly - 1 sets - 3 reps ?- Romberg Stance on Foam Pad with Head Rotation  - 1 x daily - 5 x weekly - 1-2 sets - 10 reps ?- Romberg Stance with Head Nods on Foam Pad  - 1 x daily - 5 x weekly - 1-2 sets - 10 reps ?

## 2022-01-05 NOTE — Therapy (Addendum)
?OUTPATIENT PHYSICAL THERAPY TREATMENT NOTE ? ? ?Patient Name: [name] ?MRN: [id number] ?DOB:05/08/1935, 87 y.o., female ?Today's Date: 01/05/2022 ? ?PCP: [name], FNP ?REFERRING PROVIDER: Madelon Lips, MD  ? ? PT End of Session - 01/05/22 0141   ? ? Visit Number 4   ? Number of Visits 13   ? Date for PT Re-Evaluation 02/06/22   ? Authorization Type [employer] Medicare   ? PT Start Time [phone]   ? PT Stop Time 0834   ? PT Time Calculation (min) 24 min   ? Activity Tolerance Patient tolerated treatment well   ? Behavior During Therapy [facility] for tasks assessed/performed   ? ?  ?  ? ?  ? ? ? ?Past Medical History:  ?Diagnosis Date  ? Arthritis   ? Asthma   ? CAD (coronary artery disease)   ? s/p NSTEMI 10/19 - LHC:  pRCA 99, oLCx 30 >> DES to RCA; Echo 10/19: Mild focal basal septal hypertrophy, EF 60-65, inf-lat, inf and inf-sept HK, trivial MR, trivial TR  ? Diabetes mellitus without complication ([place])   ? DKA (diabetic ketoacidoses) 08/31/2019  ? Hypertension   ? ?Past Surgical History:  ?Procedure Laterality Date  ? ABDOMINAL HYSTERECTOMY  "many yrs ago"  ? AXILLARY LYMPH NODE BIOPSY Right 02/26/2015  ? Procedure: RIGHT AXILLARY LYMPH NODE BIOPSY;  Surgeon: Jackolyn Confer, MD;  Location: [facility];  Service: General;  Laterality: Right;  ? CHOLECYSTECTOMY    ? CORONARY STENT INTERVENTION N/A 06/15/2018  ? Procedure: CORONARY STENT INTERVENTION;  Surgeon: Wellington Hampshire, MD;  Location: [place] [facility];  Service: Cardiovascular;  Laterality: N/A;  ? LEFT HEART CATH AND CORONARY ANGIOGRAPHY N/A 06/15/2018  ? Procedure: LEFT HEART CATH AND CORONARY ANGIOGRAPHY;  Surgeon: Wellington Hampshire, MD;  Location: [place] [facility];  Service: Cardiovascular;  Laterality: N/A;  ? ?Patient Active Problem List  ? Diagnosis Date Noted  ? Screening for malignant neoplasm of colon 10/22/2020  ? Gastroesophageal reflux disease 10/22/2020  ? Acquired hypothyroidism 03/12/2020  ? COVID-19 virus infection 08/31/2019  ? AKI (acute  kidney injury) ([place]) 08/31/2019  ? CAD (coronary artery disease) 09/27/2018  ? Hypertension 06/16/2018  ? Hyperlipidemia 06/16/2018  ? Type 2 diabetes mellitus with complication, with long-term current use of insulin ([place]) 06/16/2018  ? Hx of NSTEMI 10/19 Tx with DES to [facility] 06/14/2018  ? Lymphoproliferative disease ([place]) 10/03/2015  ? ? ?REFERRING DIAG: H81.10 (ICD-10-CM) - BPPV (benign paroxysmal positional vertigo)  ? ?THERAPY DIAG:  ?Unsteadiness on feet ? ?Dizziness and giddiness ? ?PERTINENT HISTORY: PERTINENT HISTORY: Have had dizziness for about 3 years. ? ?PRECAUTIONS: Fall ? ?SUBJECTIVE: I feel good.  I feel like 100% better.  I feel like I don't need to come back to therapy. ? ?PAIN:  ?Are you having pain? No ? ? ? ? ?TODAY'S TREATMENT:  ?Reviewed HEP from last visit, with pt return demo understanding-forward walking with head turns/head nods, sidestepping with head turns. ? ?-STanding feet together on Airex: ?-EO head turns x 10, head nods x 10 ?-EC with head turns/nods x 10 ? ? ? ?  ? [facility] Adult PT Treatment/Exercise - 01/05/22 0001   ? ?  ? Dynamic Gait Index  ? Level Surface Mild Impairment   ? Change in Gait Speed Normal   ? Gait with Horizontal Head Turns Normal   ? Gait with Vertical Head Turns Normal   ? Gait and Pivot Turn Normal   ? Step Over Obstacle  Mild Impairment   ? Step Around Obstacles Mild Impairment   ? Steps Normal   ? Total Score 21   ? ?  ?  ? ?  ? ? ? ?PATIENT EDUCATION: ?Education details: Updates to HEP-see instructions, progress towards goals, POC and plans to hold chart open x 4 weeks, then d/c at that time if PT has not heard from patient. ?Person educated: Patient ?Education method: Verbal, demo, handout ?Pt return understanding. ? ?HOME EXERCISE PROGRAM: ?Medbridge Access Code:  [email or web address] ? ? ?GOALS: ?Goals reviewed with patient? Yes ?  ?SHORT TERM GOALS: Target date: 01/09/2022 ?  ?Pt will be independent with exercises to decrease dizziness symptoms. ?Baseline: ?Goal status:  GOAL MET ?  ?2.  Pt will report improvement in dizziness symptoms with functional mobility by at least 50%. ?Baseline:  ?Goal status: GOAL MET-Pt reports back to 100% ?  ?  ?  ?LONG TERM GOALS: Target date: 02/06/2022 ?  ?Pt will be independent with exercises to decrease dizziness symptoms and improve overall functional mobility. ?Baseline:  ?Goal status: GOAL MET ?  ?2.  Pt will perform Brant-Daroff exercises with no increase in dizziness symptoms for improved overall mobility. ?Baseline:  ?Goal status: GOAL MET ?  ?3.  DGI to be assessed with pt to improve DGI score by at least 4 points for decreased  ?Baseline: 13/24 12/29/2021>21/24 01/05/2022 ?Goal status: GOAL MET ?  ?4.  Pt will perform bed mobility without c/o vertigo. ?Baseline:  ?Goal status: GOAL MET ?  ?  ?  ?ASSESSMENT: ?  ?CLINICAL IMPRESSION: ? ?Pt presents to OPPT today stating she feels 100%!  She would like to finish PT today.  Updated HEP to include compliant surface exercises and assessed all goals.  Pt has met all STGs and all LTGs.  She is not having any functional mobility limitations due to dizziness and she has improved DGI from 13/24 to 21/24, resulting in decrease in falls.  Pt in agreement to hold chart open for 4 weeks in case of any additional vertigo episodes, and at that time, PT will discharge patient if she has not heard from pt.  ?  ?  ?  ?  ?PLAN: ?PT FREQUENCY: 2x/week ?  ?PT DURATION: 6 weeks ?  ?PLANNED INTERVENTIONS: Therapeutic exercises, Therapeutic activity, Neuromuscular re-education, Balance training, Gait training, Patient/Family education, Joint mobilization, Vestibular training, and Canalith repositioning ?  ?PLAN FOR NEXT SESSION: No further PT needs at that time; will plan to d/c PT in 4 weeks if pt has not contacted PT with additional reports of dizziness.   ? ? ? ? ? ? ?Agnieszka Newhouse W., PT ?01/05/2022, 8:40 AM ? ?  ? ?

## 2022-01-09 ENCOUNTER — Encounter: Payer: Medicare Other | Admitting: Physical Therapy

## 2022-01-26 ENCOUNTER — Other Ambulatory Visit: Payer: Self-pay | Admitting: Nurse Practitioner

## 2022-02-09 ENCOUNTER — Encounter: Payer: Self-pay | Admitting: Physical Therapy

## 2022-02-09 NOTE — Therapy (Signed)
[facility] [address] [place], [place], [address] Phone: [phone]   Fax:  [phone]  Patient Details  Name: [name] MRN: [id number] Date of Birth: 1935/03/29 Referring Provider:  No ref. provider found  Encounter Date: 02/09/2022   PHYSICAL THERAPY DISCHARGE SUMMARY  Visits from Start of Care: 4  Current functional level related to goals / functional outcomes:  Pt has met all STGs and LTGs    Remaining deficits: No vestibular symptoms at last visit-symptoms have resolved.   Education / Equipment: Educated in [employer].     Patient agrees to discharge. Patient goals were met. Patient is being discharged due to meeting the stated rehab goals.   Frazier Butt., PT 02/09/2022, 2:45 PM  [facility] [address], [place], [place], [address] Phone: [phone]   Fax:  [phone]

## 2022-02-10 DIAGNOSIS — H2511 Age-related nuclear cataract, right eye: Secondary | ICD-10-CM | POA: Diagnosis not present

## 2022-02-10 DIAGNOSIS — E119 Type 2 diabetes mellitus without complications: Secondary | ICD-10-CM | POA: Diagnosis not present

## 2022-02-10 DIAGNOSIS — H40023 Open angle with borderline findings, high risk, bilateral: Secondary | ICD-10-CM | POA: Diagnosis not present

## 2022-02-18 ENCOUNTER — Other Ambulatory Visit: Payer: Self-pay | Admitting: Nurse Practitioner

## 2022-02-26 ENCOUNTER — Other Ambulatory Visit: Payer: Self-pay | Admitting: Nurse Practitioner

## 2022-03-11 ENCOUNTER — Encounter: Payer: Self-pay | Admitting: Podiatry

## 2022-03-11 ENCOUNTER — Ambulatory Visit (INDEPENDENT_AMBULATORY_CARE_PROVIDER_SITE_OTHER): Payer: Medicare Other | Admitting: Podiatry

## 2022-03-11 DIAGNOSIS — B351 Tinea unguium: Secondary | ICD-10-CM

## 2022-03-11 DIAGNOSIS — M79675 Pain in left toe(s): Secondary | ICD-10-CM

## 2022-03-11 DIAGNOSIS — M79674 Pain in right toe(s): Secondary | ICD-10-CM

## 2022-03-11 DIAGNOSIS — E1142 Type 2 diabetes mellitus with diabetic polyneuropathy: Secondary | ICD-10-CM

## 2022-03-11 DIAGNOSIS — M722 Plantar fascial fibromatosis: Secondary | ICD-10-CM

## 2022-03-11 NOTE — Patient Instructions (Addendum)
Administrator, arts Arch supports from [employer], [employer] or [employer].  Plantar Fasciitis  Plantar fasciitis is a painful foot condition that affects the heel. It occurs when the band of tissue that connects the toes to the heel bone (plantar fascia) becomes irritated. This can happen as the result of exercising too much or doing other repetitive activities (overuse injury). Plantar fasciitis can cause mild irritation to severe pain that makes it difficult to walk or move. The pain is usually worse in the morning after sleeping, or after sitting or lying down for a period of time. Pain may also be worse after long periods of walking or standing. What are the causes? This condition may be caused by: Standing for long periods of time. Wearing shoes that do not have good arch support. Doing activities that put stress on joints (high-impact activities). This includes ballet and exercise that makes your heart beat faster (aerobic exercise), such as running. Being overweight. An abnormal way of walking (gait). Tight muscles in the back of your lower leg (calf). High arches in your feet or flat feet. Starting a new athletic activity. What are the signs or symptoms? The main symptom of this condition is heel pain. Pain may get worse after the following: Taking the first steps after a time of rest, especially in the morning after awakening, or after you have been sitting or lying down for a while. Long periods of standing still. Pain may decrease after 30-45 minutes of activity, such as gentle walking. How is this diagnosed? This condition may be diagnosed based on your medical history, a physical exam, and your symptoms. Your health care provider will check for: A tender area on the bottom of your foot. A high arch in your foot or flat feet. Pain when you move your foot. Difficulty moving your foot. You may have imaging tests to confirm the diagnosis, such  as: X-rays. Ultrasound. MRI. How is this treated? Treatment for plantar fasciitis depends on how severe your condition is. Treatment may include: Rest, ice, pressure (compression), and raising (elevating) the affected foot. This is called RICE therapy. Your health care provider may recommend RICE therapy along with over-the-counter pain medicines to manage your pain. Exercises to stretch your calves and your plantar fascia. A splint that holds your foot in a stretched, upward position while you sleep (night splint). Physical therapy to relieve symptoms and prevent problems in the future. Injections of steroid medicine (cortisone) to relieve pain and inflammation. Stimulating your plantar fascia with electrical impulses (extracorporeal shock wave therapy). This is usually the last treatment option before surgery. Surgery, if other treatments have not worked after 12 months. Follow these instructions at home: Managing pain, stiffness, and swelling  If directed, put ice on the painful area. To do this: Put ice in a plastic bag, or use a frozen bottle of water. Place a towel between your skin and the bag or bottle. Roll the bottom of your foot over the bag or bottle. Do this for 20 minutes, 2-3 times a day. Wear athletic shoes that have air-sole or gel-sole cushions, or try soft shoe inserts that are designed for plantar fasciitis. Elevate your foot above the level of your heart while you are sitting or lying down. Activity Avoid activities that cause pain. Ask your health care provider what activities are safe for you. Do physical therapy exercises and stretches as told by your health care provider. Try activities and forms of exercise that are easier on  your joints (low impact). Examples include swimming, water aerobics, and biking. General instructions Take over-the-counter and prescription medicines only as told by your health care provider. Wear a night splint while sleeping, if told by  your health care provider. Loosen the splint if your toes tingle, become numb, or turn cold and blue. Maintain a healthy weight, or work with your health care provider to lose weight as needed. Keep all follow-up visits. This is important. Contact a health care provider if you have: Symptoms that do not go away with home treatment. Pain that gets worse. Pain that affects your ability to move or do daily activities. Summary Plantar fasciitis is a painful foot condition that affects the heel. It occurs when the band of tissue that connects the toes to the heel bone (plantar fascia) becomes irritated. Heel pain is the main symptom of this condition. It may get worse after exercising too much or standing still for a long time. Treatment varies, but it usually starts with rest, ice, pressure (compression), and raising (elevating) the affected foot. This is called RICE therapy. Over-the-counter medicines can also be used to manage pain. This information is not intended to replace advice given to you by your health care provider. Make sure you discuss any questions you have with your health care provider. Document Revised: 11/27/2019 Document Reviewed: 11/27/2019 Elsevier Patient Education  [place].

## 2022-03-16 ENCOUNTER — Other Ambulatory Visit: Payer: Self-pay | Admitting: Nurse Practitioner

## 2022-03-16 NOTE — Progress Notes (Signed)
  Subjective:  Patient ID: [name], female    DOB: 12-01-1934,  MRN: [id number]  [name] presents to clinic today for at risk foot care with history of diabetic neuropathy and painful thick toenails that are difficult to trim. Pain interferes with ambulation. Aggravating factors include wearing enclosed shoe gear. Pain is relieved with periodic professional debridement.  Patient states blood glucose was 110 mg/dl today.  Last known HgA1c was unknown.    New problem(s):   right heel pain for the past couple of weeks. She relates no episode of trauma. Pain presents at first step in the morning and after periods of rest  PCP is Madeline Brine, [name] , and last visit was  October 01, 2021  No Known Allergies  Review of Systems: Negative except as noted in the HPI.  Objective:  [name] is a pleasant 87 y.o. female in NAD. AAO X 3.  Vascular Examination: CFT immediate b/l LE. Palpable DP/PT pulses b/l LE. Digital hair present b/l. Skin temperature gradient WNL b/l. No pain with calf compression b/l. No edema noted b/l. No cyanosis or clubbing noted b/l LE.  Dermatological Examination: Pedal integument with normal turgor, texture and tone b/l LE. No open wounds b/l. No interdigital macerations b/l. Toenails 1-5 b/l elongated, thickened, discolored with subungual debris. +Tenderness with dorsal palpation of nailplates. No hyperkeratotic or porokeratotic lesions present.  Musculoskeletal Examination: Normal muscle strength 5/5 to all lower extremity muscle groups bilaterally. HAV with bunion deformity noted b/l LE. Pes planus deformity noted bilateral LE. Pain on palpation medial tubercle right heel.   Patient ambulates independently without assistive aids.  Neurological Examination: Protective sensation intact 5/5 intact bilaterally with 10g monofilament b/l. Vibratory sensation decreased b/l. Clonus negative b/l.    Latest Ref Rng & Units 12/30/2021   11:06 AM  10/01/2021    9:39 AM 03/17/2021    9:44 AM  Hemoglobin A1C  Hemoglobin-A1c 4.8 - 5.6 % 9.8  9.5  9.1    Assessment/Plan: 1. Pain due to onychomycosis of toenails of both feet   2. Plantar fasciitis, right   3. Diabetic peripheral neuropathy associated with type 2 diabetes mellitus ([place])     -Patient was evaluated and treated. All patient's and/or POA's questions/concerns answered on today's visit. -Discussed plantar fasciitis right foot. Patient declined referral for further workup. She declined plantar fascial brace today. Recommended [employer]en[employer] Arch Supports. She will call back if symptoms persist or get worse. -Patient to continue soft, supportive shoe gear daily. -Toenails 1-5 b/l were debrided in length and girth with sterile nail nippers and dremel without iatrogenic bleeding.  -Patient/POA to call should there be question/concern in the interim.   Return in about 3 months (around 06/11/2022).  Marzetta Board, DPM

## 2022-03-26 ENCOUNTER — Other Ambulatory Visit: Payer: Self-pay | Admitting: Nurse Practitioner

## 2022-03-26 DIAGNOSIS — E118 Type 2 diabetes mellitus with unspecified complications: Secondary | ICD-10-CM

## 2022-03-30 ENCOUNTER — Other Ambulatory Visit: Payer: Self-pay | Admitting: Nurse Practitioner

## 2022-04-01 ENCOUNTER — Encounter: Payer: Self-pay | Admitting: Nurse Practitioner

## 2022-04-01 ENCOUNTER — Ambulatory Visit (INDEPENDENT_AMBULATORY_CARE_PROVIDER_SITE_OTHER): Payer: Medicare Other | Admitting: Nurse Practitioner

## 2022-04-01 VITALS — BP 124/70 | HR 79 | Temp 97.6°F | Ht 62.0 in | Wt 147.0 lb

## 2022-04-01 DIAGNOSIS — E039 Hypothyroidism, unspecified: Secondary | ICD-10-CM | POA: Diagnosis not present

## 2022-04-01 DIAGNOSIS — E559 Vitamin D deficiency, unspecified: Secondary | ICD-10-CM | POA: Diagnosis not present

## 2022-04-01 DIAGNOSIS — Z853 Personal history of malignant neoplasm of breast: Secondary | ICD-10-CM

## 2022-04-01 DIAGNOSIS — E1122 Type 2 diabetes mellitus with diabetic chronic kidney disease: Secondary | ICD-10-CM | POA: Diagnosis not present

## 2022-04-01 DIAGNOSIS — E785 Hyperlipidemia, unspecified: Secondary | ICD-10-CM

## 2022-04-01 DIAGNOSIS — N1832 Chronic kidney disease, stage 3b: Secondary | ICD-10-CM | POA: Diagnosis not present

## 2022-04-01 NOTE — Progress Notes (Signed)
I,[name],acting as a Education administrator for [employer], FNP.,have documented all relevant documentation on the behalf of [name], FNP,as directed by  [name], FNP while in the presence of [name], [place].  Subjective:     Patient ID: [name] , female    DOB: Jun 21, 1935 , 87 y.o.   MRN: [id number]   Chief Complaint  Patient presents with   Diabetes   Hypertension    HPI  Pt presents for bp & dm check. She reports compliance with medications, no questions or concerns. She has been to the eye doctor since her last appt, next due to see Dr. Venetia Maxon in October. She walks daily for one hour.   Wt Readings from Last 3 Encounters: 04/01/22 : 147 lb (66.7 kg) 12/30/21 : 149 lb 3.2 oz (67.7 kg) 10/01/21 : 149 lb (67.6 kg)    Diabetes She presents for her follow-up diabetic visit. She has type 2 diabetes mellitus. Her disease course has been worsening. There are no hypoglycemic associated symptoms. Pertinent negatives for hypoglycemia include no headaches. Pertinent negatives for diabetes include no chest pain, no fatigue, no polydipsia, no polyphagia and no polyuria. There are no hypoglycemic complications. There are no diabetic complications. Risk factors for coronary artery disease include diabetes mellitus. Current diabetic treatment includes oral agent (dual therapy). She is compliant with treatment all of the time. She is following a generally healthy diet. When asked about meal planning, she reported none. She participates in exercise daily. There is no change in her home blood glucose trend. (Blood sugar 94-112 since increasing her trulicity) She sees a podiatrist Susa Griffins).Eye exam is current (will request records from Dr. Venetia Maxon).  Hypertension This is a chronic problem. The current episode started more than 1 year ago. The problem has been gradually improving since onset. The problem is controlled. Pertinent negatives include no anxiety, chest pain, headaches,  palpitations or shortness of breath. There are no associated agents to hypertension. Risk factors for coronary artery disease include diabetes mellitus and dyslipidemia. Past treatments include angiotensin blockers, calcium channel blockers and beta blockers. There are no compliance problems.  Hypertensive end-organ damage includes kidney disease and CAD/MI. There is no history of angina. Identifiable causes of hypertension include chronic renal disease and a thyroid problem.     Past Medical History:  Diagnosis Date   Arthritis    Asthma    CAD (coronary artery disease)    s/p NSTEMI 10/19 - LHC:  pRCA 99, oLCx 30 >> DES to RCA; Echo 10/19: Mild focal basal septal hypertrophy, EF 60-65, inf-lat, inf and inf-sept HK, trivial MR, trivial TR   Diabetes mellitus without complication (HCC)    DKA (diabetic ketoacidoses) 08/31/2019   Hypertension      Family History  Problem Relation Age of Onset   Diabetes Mother    Heart disease Mother    Diabetes Father    Diabetes Sister    Heart disease Sister    Diabetes Brother      Current Outpatient Medications:    Accu-Chek FastClix Lancets MISC, USE AS DIRECTED FOR  DAILY  TESTING, Disp: 102 each, Rfl: 0   ACCU-CHEK GUIDE test strip, USE TO CHECK BLOOD SUGAR ONCE DAILY, Disp: 100 each, Rfl: 0   acetaminophen (TYLENOL) 325 MG tablet, Take by mouth every 6 (six) hours as needed for mild pain., Disp: , Rfl:    amLODipine-olmesartan (AZOR) 5-20 MG tablet, TAKE 1 TABLET BY MOUTH ONCE DAILY IN THE MORNING, Disp: 90 tablet, Rfl:  0   ANORO ELLIPTA 62.5-25 MCG/ACT AEPB, Inhale 1 puff by mouth once daily, Disp: 60 each, Rfl: 0   aspirin EC 81 MG tablet, Take 1 tablet (81 mg total) by mouth daily. Swallow whole., Disp: 90 tablet, Rfl: 3   Cholecalciferol (VITAMIN D3) 10 MCG (400 UNIT) tablet, Take 400 Units by mouth daily. Per patient taking 2000 iu, Disp: , Rfl:    Dulaglutide (TRULICITY) 4.5 XB/3.5HG SOPN, Inject 4.5 mg as directed once a week., Disp: 6  mL, Rfl: 1   Ergocalciferol (VITAMIN D2 PO), , Disp: , Rfl:    FLUZONE HIGH-DOSE QUADRIVALENT 0.7 ML SUSY, , Disp: , Rfl:    Insulin Pen Needle (PEN NEEDLES 29GX1/2") 29G X 12MM MISC, , Disp: , Rfl:    JARDIANCE 10 MG TABS tablet, TAKE 1 TABLET BY MOUTH ONCE DAILY BEFORE BREAKFAST, Disp: 90 tablet, Rfl: 0   LANTUS SOLOSTAR 100 UNIT/ML Solostar Pen, INJECT 30 UNITS SUBCUTANEOUSLY ONCE DAILY (Patient taking differently: 32 Units.), Disp: 15 mL, Rfl: 0   levothyroxine (SYNTHROID) 50 MCG tablet, Take 1 tablet by mouth once daily, Disp: 90 tablet, Rfl: 0   meclizine (ANTIVERT) 25 MG tablet, TAKE 1 TABLET BY MOUTH ONCE DAILY AS NEEDED FOR DIZZINESS, Disp: 30 tablet, Rfl: 0   metoprolol succinate (TOPROL-XL) 25 MG 24 hr tablet, Take 1 tablet by mouth once daily, Disp: 90 tablet, Rfl: 3   NAPROXEN PO, , Disp: , Rfl:    nitroGLYCERIN (NITROSTAT) 0.4 MG SL tablet, Place 1 tablet (0.4 mg total) under the tongue every 5 (five) minutes x 3 doses as needed for chest pain., Disp: 25 tablet, Rfl: 7   RELION PEN NEEDLE 31G/8MM 31G X 8 MM MISC, USE AS DIRECTED, Disp: 100 each, Rfl: 0   rosuvastatin (CRESTOR) 20 MG tablet, Take 1 tablet by mouth once daily, Disp: 90 tablet, Rfl: 3   XIIDRA 5 % SOLN, Place 2 drops into both eyes 2 (two) times daily. , Disp: , Rfl:    No Known Allergies   Review of Systems  Constitutional: Negative.  Negative for fatigue.  Respiratory: Negative.  Negative for shortness of breath.   Cardiovascular: Negative.  Negative for chest pain, palpitations and leg swelling.  Gastrointestinal: Negative.   Endocrine: Negative for polydipsia, polyphagia and polyuria.  Neurological: Negative.  Negative for headaches.  Psychiatric/Behavioral: Negative.       Today's Vitals   04/01/22 0840  BP: 124/70  Pulse: 79  Temp: 97.6 F (36.4 C)  TempSrc: Oral  Weight: 147 lb (66.7 kg)  Height: _0  (1.575 m)   Body mass index is 26.89 kg/m.  Wt Readings from Last 3 Encounters:  04/01/22  147 lb (66.7 kg)  12/30/21 149 lb 3.2 oz (67.7 kg)  10/01/21 149 lb (67.6 kg)    Objective:  Physical Exam Vitals reviewed.  Constitutional:      General: She is not in acute distress.    Appearance: Normal appearance. She is well-developed.  HENT:     Head: Normocephalic and atraumatic.     Right Ear: Hearing normal.     Left Ear: Hearing normal.  Eyes:     General: Lids are normal.     Extraocular Movements: Extraocular movements intact.     Pupils: Pupils are equal, round, and reactive to light.     Funduscopic exam:    Right eye: No papilledema.        Left eye: No papilledema.  Neck:     Thyroid: No thyroid  mass.  Cardiovascular:     Rate and Rhythm: Normal rate and regular rhythm.     Pulses: Normal pulses.     Heart sounds: Normal heart sounds. No murmur heard. Pulmonary:     Effort: Pulmonary effort is normal. No respiratory distress.     Breath sounds: Normal breath sounds. No wheezing.  Chest:     Chest wall: No mass.  Breasts:    Right: Normal. No mass or tenderness.     Left: Normal. No mass or tenderness.  Musculoskeletal:     Cervical back: Full passive range of motion without pain.     Right lower leg: No edema.     Left lower leg: No edema.  Lymphadenopathy:     Upper Body:     Right upper body: No supraclavicular, axillary or pectoral adenopathy.     Left upper body: No supraclavicular, axillary or pectoral adenopathy.  Skin:    General: Skin is warm and dry.     Capillary Refill: Capillary refill takes less than 2 seconds.  Neurological:     General: No focal deficit present.     Mental Status: She is alert and oriented to person, place, and time.     Cranial Nerves: No cranial nerve deficit.     Sensory: No sensory deficit.     Motor: No weakness.  Psychiatric:        Mood and Affect: Mood normal.        Behavior: Behavior normal.        Thought Content: Thought content normal.        Judgment: Judgment normal.         Assessment And  Plan:     1. Type 2 diabetes mellitus with stage 3b chronic kidney disease, without long-term current use of insulin (HCC) Comments: Blood sugars improving with the most recent increase of Trulicity. Will recheck HgbA1c.  - Hemoglobin A1c  2. Acquired hypothyroidism Comments: Stable, continue current medications pending labs - Thyroid Panel With TSH  3. Hyperlipidemia, unspecified hyperlipidemia type Comments: Cholesterol levels are stable. Continue current medications and low fat diet - Lipid panel - BMP8+EGFR  4. Vitamin D deficiency Will check vitamin D level and supplement as needed.    Also encouraged to spend 15 minutes in the sun daily.  - VITAMIN D 25 Hydroxy (Vit-D Deficiency, Fractures)  5. History of breast cancer Comments: She is currently in remission.     Patient was given opportunity to ask questions. Patient verbalized understanding of the plan and was able to repeat key elements of the plan. All questions were answered to their satisfaction.  [name], FNP   I, [name], FNP, have reviewed all documentation for this visit. The documentation on 04/01/22 for the exam, diagnosis, procedures, and orders are all accurate and complete.   IF YOU HAVE BEEN REFERRED TO A SPECIALIST, IT MAY TAKE 1-2 WEEKS TO SCHEDULE/PROCESS THE REFERRAL. IF YOU HAVE NOT HEARD FROM US/SPECIALIST IN TWO WEEKS, PLEASE GIVE [place] A CALL AT [phone].   THE PATIENT IS ENCOURAGED TO PRACTICE SOCIAL DISTANCING DUE TO THE COVID-19 PANDEMIC.

## 2022-04-01 NOTE — Patient Instructions (Signed)

## 2022-04-02 LAB — BMP8+EGFR
BUN/Creatinine Ratio: 8 — ABNORMAL LOW (ref 12–28)
BUN: 10 mg/dL (ref 8–27)
CO2: 23 mmol/L (ref 20–29)
Calcium: 9.8 mg/dL (ref 8.7–10.3)
Chloride: 104 mmol/L (ref 96–106)
Creatinine, Ser: 1.21 mg/dL — ABNORMAL HIGH (ref 0.57–1.00)
Glucose: 113 mg/dL — ABNORMAL HIGH (ref 70–99)
Potassium: 4.9 mmol/L (ref 3.5–5.2)
Sodium: 142 mmol/L (ref 134–144)
eGFR: 44 mL/min/{1.73_m2} — ABNORMAL LOW (ref >59)

## 2022-04-02 LAB — LIPID PANEL
Chol/HDL Ratio: 2.5 ratio (ref 0.0–4.4)
Cholesterol, Total: 122 mg/dL (ref 100–199)
HDL: 49 mg/dL (ref >39)
LDL Chol Calc (NIH): 53 mg/dL (ref 0–99)
Triglycerides: 108 mg/dL (ref 0–149)
VLDL Cholesterol Cal: 20 mg/dL (ref 5–40)

## 2022-04-02 LAB — HEMOGLOBIN A1C
Est. average glucose Bld gHb Est-mCnc: 206 mg/dL
Hgb A1c MFr Bld: 8.8 % — ABNORMAL HIGH (ref 4.8–5.6)

## 2022-04-02 LAB — THYROID PANEL WITH TSH
Free Thyroxine Index: 2.2 (ref 1.2–4.9)
T3 Uptake Ratio: 26 % (ref 24–39)
T4, Total: 8.3 ug/dL (ref 4.5–12.0)
TSH: 0.983 u[IU]/mL (ref 0.450–4.500)

## 2022-04-02 LAB — VITAMIN D 25 HYDROXY (VIT D DEFICIENCY, FRACTURES): Vit D, 25-Hydroxy: 59 ng/mL (ref 30.0–100.0)

## 2022-04-03 ENCOUNTER — Other Ambulatory Visit: Payer: Self-pay | Admitting: Nurse Practitioner

## 2022-04-09 ENCOUNTER — Other Ambulatory Visit: Payer: Self-pay | Admitting: Cardiovascular Disease

## 2022-04-09 ENCOUNTER — Other Ambulatory Visit: Payer: Self-pay

## 2022-04-10 ENCOUNTER — Encounter: Payer: Self-pay | Admitting: Nurse Practitioner

## 2022-04-21 ENCOUNTER — Other Ambulatory Visit: Payer: Self-pay | Admitting: Nurse Practitioner

## 2022-04-22 ENCOUNTER — Other Ambulatory Visit: Payer: Self-pay | Admitting: Nurse Practitioner

## 2022-04-23 ENCOUNTER — Other Ambulatory Visit: Payer: Self-pay | Admitting: Nurse Practitioner

## 2022-04-23 DIAGNOSIS — Z1231 Encounter for screening mammogram for malignant neoplasm of breast: Secondary | ICD-10-CM

## 2022-04-29 ENCOUNTER — Other Ambulatory Visit: Payer: Self-pay | Admitting: Nurse Practitioner

## 2022-05-02 ENCOUNTER — Other Ambulatory Visit: Payer: Self-pay | Admitting: Nurse Practitioner

## 2022-05-11 ENCOUNTER — Other Ambulatory Visit: Payer: Self-pay | Admitting: Nurse Practitioner

## 2022-05-25 ENCOUNTER — Other Ambulatory Visit: Payer: Self-pay | Admitting: Nurse Practitioner

## 2022-05-27 ENCOUNTER — Ambulatory Visit
Admission: RE | Admit: 2022-05-27 | Discharge: 2022-05-27 | Disposition: A | Discharge status: Home / Self Care | Payer: Medicare Other | Source: Ambulatory Visit | Attending: Nurse Practitioner | Admitting: Nurse Practitioner

## 2022-05-27 DIAGNOSIS — Z1231 Encounter for screening mammogram for malignant neoplasm of breast: Secondary | ICD-10-CM | POA: Diagnosis not present

## 2022-06-13 ENCOUNTER — Other Ambulatory Visit: Payer: Self-pay | Admitting: Nurse Practitioner

## 2022-06-14 ENCOUNTER — Other Ambulatory Visit: Payer: Self-pay | Admitting: Nurse Practitioner

## 2022-06-15 ENCOUNTER — Other Ambulatory Visit: Payer: Self-pay | Admitting: Nurse Practitioner

## 2022-06-15 DIAGNOSIS — E118 Type 2 diabetes mellitus with unspecified complications: Secondary | ICD-10-CM

## 2022-06-16 DIAGNOSIS — H2511 Age-related nuclear cataract, right eye: Secondary | ICD-10-CM | POA: Diagnosis not present

## 2022-06-16 DIAGNOSIS — E119 Type 2 diabetes mellitus without complications: Secondary | ICD-10-CM | POA: Diagnosis not present

## 2022-06-16 DIAGNOSIS — H40023 Open angle with borderline findings, high risk, bilateral: Secondary | ICD-10-CM | POA: Diagnosis not present

## 2022-06-17 ENCOUNTER — Ambulatory Visit: Payer: Medicare Other | Admitting: Podiatry

## 2022-06-23 ENCOUNTER — Ambulatory Visit (INDEPENDENT_AMBULATORY_CARE_PROVIDER_SITE_OTHER): Payer: Medicare Other | Admitting: Podiatry

## 2022-06-23 ENCOUNTER — Encounter: Payer: Self-pay | Admitting: Podiatry

## 2022-06-23 DIAGNOSIS — M79675 Pain in left toe(s): Secondary | ICD-10-CM | POA: Diagnosis not present

## 2022-06-23 DIAGNOSIS — M79674 Pain in right toe(s): Secondary | ICD-10-CM

## 2022-06-23 DIAGNOSIS — B351 Tinea unguium: Secondary | ICD-10-CM | POA: Diagnosis not present

## 2022-06-23 DIAGNOSIS — E1142 Type 2 diabetes mellitus with diabetic polyneuropathy: Secondary | ICD-10-CM

## 2022-06-23 NOTE — Progress Notes (Signed)
  Subjective:  Patient ID: [name], female    DOB: 07/18/1935,  MRN: [id number]  Mabell Esguerra presents to clinic today for at risk foot care with h/o diabetic neuropathy. She is seen for painful, discolored, thick toenails which interfere with daily activities.  Patient states she purchased Spenco Arch supports and her plantar fasciitis has resolved.  Chief Complaint  Patient presents with   Nail Problem    DFC  BG - 90 this morning A1C - pt does not recall  PCP - [name], appt next month  . New problem(s): None.   PCP is [name], FNP , and last visit was  April 01, 2022.  No Known Allergies  Review of Systems: Negative except as noted in the HPI.  Objective:   [name] is a pleasant 87 y.o. female WD, WN in NAD. AAO x 3.  Vascular Examination: CFT immediate b/l LE. Palpable DP/PT pulses b/l LE. Digital hair present b/l. Skin temperature gradient WNL b/l. No pain with calf compression b/l. No edema noted b/l. No cyanosis or clubbing noted b/l LE.  Dermatological Examination: Pedal integument with normal turgor, texture and tone b/l LE. No open wounds b/l. No interdigital macerations b/l. Toenails 1-5 b/l elongated, thickened, discolored with subungual debris. +Tenderness with dorsal palpation of nailplates. No hyperkeratotic or porokeratotic lesions present.  Musculoskeletal Examination: Normal muscle strength 5/5 to all lower extremity muscle groups bilaterally. HAV with bunion deformity noted b/l LE. Pes planus deformity noted bilateral LE. No pain on palpation medial tubercle right heel. Patient ambulates independently without assistive aids.  Neurological Examination: Protective sensation intact 5/5 intact bilaterally with 10g monofilament b/l. Vibratory sensation decreased b/l. Clonus negative b/l.  Assessment/Plan: 1. Pain due to onychomycosis of toenails of both feet   2. Diabetic peripheral neuropathy associated with type 2  diabetes mellitus (HCC)     No orders of the defined types were placed in this encounter.   -Consent given for treatment as described below: -Examined patient. -Patient using Spenco Arch Supports and plantar fasciitis has resolved. -Patient to continue soft, supportive shoe gear daily. -Mycotic toenails 1-5 bilaterally were debrided in length and girth with sterile nail nippers and dremel without incident. -Patient/POA to call should there be question/concern in the interim.   Return in about 3 months (around 09/23/2022).  Marzetta Board, DPM

## 2022-06-24 ENCOUNTER — Other Ambulatory Visit: Payer: Self-pay | Admitting: Nurse Practitioner

## 2022-07-01 ENCOUNTER — Ambulatory Visit (INDEPENDENT_AMBULATORY_CARE_PROVIDER_SITE_OTHER): Payer: Medicare Other | Admitting: Nurse Practitioner

## 2022-07-01 ENCOUNTER — Encounter: Payer: Self-pay | Admitting: Nurse Practitioner

## 2022-07-01 VITALS — BP 120/70 | HR 73 | Temp 97.8°F | Ht 62.0 in | Wt 147.4 lb

## 2022-07-01 DIAGNOSIS — E559 Vitamin D deficiency, unspecified: Secondary | ICD-10-CM | POA: Diagnosis not present

## 2022-07-01 DIAGNOSIS — I129 Hypertensive chronic kidney disease with stage 1 through stage 4 chronic kidney disease, or unspecified chronic kidney disease: Secondary | ICD-10-CM

## 2022-07-01 DIAGNOSIS — N183 Chronic kidney disease, stage 3 unspecified: Secondary | ICD-10-CM

## 2022-07-01 DIAGNOSIS — Z0000 Encounter for general adult medical examination without abnormal findings: Secondary | ICD-10-CM | POA: Diagnosis not present

## 2022-07-01 DIAGNOSIS — E663 Overweight: Secondary | ICD-10-CM

## 2022-07-01 DIAGNOSIS — E785 Hyperlipidemia, unspecified: Secondary | ICD-10-CM

## 2022-07-01 DIAGNOSIS — N1832 Chronic kidney disease, stage 3b: Secondary | ICD-10-CM | POA: Diagnosis not present

## 2022-07-01 DIAGNOSIS — E039 Hypothyroidism, unspecified: Secondary | ICD-10-CM | POA: Diagnosis not present

## 2022-07-01 DIAGNOSIS — E1122 Type 2 diabetes mellitus with diabetic chronic kidney disease: Secondary | ICD-10-CM

## 2022-07-01 LAB — POCT URINALYSIS DIPSTICK
Bilirubin, UA: NEGATIVE
Glucose, UA: POSITIVE — AB
Ketones, UA: NEGATIVE
Nitrite, UA: NEGATIVE
Protein, UA: NEGATIVE
Spec Grav, UA: 1.02 (ref 1.010–1.025)
Urobilinogen, UA: 0.2 E.U./dL
pH, UA: 5.5 (ref 5.0–8.0)

## 2022-07-01 NOTE — Patient Instructions (Signed)

## 2022-07-01 NOTE — Progress Notes (Signed)
Barnet Glasgow Martin,acting as a Education administrator for [name], FNP.,have documented all relevant documentation on the behalf of [name], FNP,as directed by  [name], FNP while in the presence of [name], [place].   Subjective:     Patient ID: [name] , female    DOB: 06-Oct-1934 , 87 y.o.   MRN: [id number]   Chief Complaint  Patient presents with   Annual Exam    HPI  Patient here for HM. Patient states compliance with medications and has no other concerns today.   BP Readings from Last 3 Encounters: 07/01/22 : 120/70 04/01/22 : 124/70 12/30/21 : 108/80       Past Medical History:  Diagnosis Date   Arthritis    Asthma    CAD (coronary artery disease)    s/p NSTEMI 10/19 - LHC:  pRCA 99, oLCx 30 >> DES to RCA; Echo 10/19: Mild focal basal septal hypertrophy, EF 60-65, inf-lat, inf and inf-sept HK, trivial MR, trivial TR   Diabetes mellitus without complication (HCC)    DKA (diabetic ketoacidoses) 08/31/2019   Hypertension      Family History  Problem Relation Age of Onset   Diabetes Mother    Heart disease Mother    Diabetes Father    Diabetes Sister    Heart disease Sister    Diabetes Brother      Current Outpatient Medications:    Accu-Chek FastClix Lancets MISC, USE AS DIRECTED FOR DAILY TESTING, Disp: 102 each, Rfl: 0   ACCU-CHEK GUIDE test strip, USE AS DIRECTED TO  CHECK  BLOOD  SUGAR  DAILY, Disp: 100 each, Rfl: 0   acetaminophen (TYLENOL) 325 MG tablet, Take by mouth every 6 (six) hours as needed for mild pain., Disp: , Rfl:    amLODipine-olmesartan (AZOR) 5-20 MG tablet, TAKE 1 TABLET BY MOUTH ONCE DAILY IN THE MORNING, Disp: 90 tablet, Rfl: 0   ANORO ELLIPTA 62.5-25 MCG/ACT AEPB, Inhale 1 puff by mouth once daily, Disp: 60 each, Rfl: 0   aspirin EC 81 MG tablet, Take 1 tablet (81 mg total) by mouth daily. Swallow whole., Disp: 90 tablet, Rfl: 3   Cholecalciferol (VITAMIN D3) 10 MCG (400 UNIT) tablet, Take 400 Units by mouth daily. Per patient  taking 2000 iu, Disp: , Rfl:    Ergocalciferol (VITAMIN D2 PO), , Disp: , Rfl:    Insulin Pen Needle (PEN NEEDLES 29GX1/2") 29G X 12MM MISC, , Disp: , Rfl:    JARDIANCE 10 MG TABS tablet, TAKE 1 TABLET BY MOUTH ONCE DAILY BEFORE BREAKFAST, Disp: 90 tablet, Rfl: 0   LANTUS SOLOSTAR 100 UNIT/ML Solostar Pen, INJECT 30 UNITS SUBCUTANEOUSLY ONCE DAILY, Disp: 15 mL, Rfl: 0   levothyroxine (SYNTHROID) 50 MCG tablet, Take 1 tablet by mouth once daily, Disp: 90 tablet, Rfl: 0   meclizine (ANTIVERT) 25 MG tablet, TAKE 1 TABLET BY MOUTH ONCE DAILY AS NEEDED FOR DIZZINESS, Disp: 30 tablet, Rfl: 0   metoprolol succinate (TOPROL-XL) 25 MG 24 hr tablet, Take 1 tablet by mouth once daily, Disp: 90 tablet, Rfl: 1   NAPROXEN PO, , Disp: , Rfl:    nitroGLYCERIN (NITROSTAT) 0.4 MG SL tablet, Place 1 tablet (0.4 mg total) under the tongue every 5 (five) minutes x 3 doses as needed for chest pain., Disp: 25 tablet, Rfl: 7   RELION PEN NEEDLE 31G/8MM 31G X 8 MM MISC, USE AS DIRECTED, Disp: 100 each, Rfl: 0   rosuvastatin (CRESTOR) 20 MG tablet, Take 1 tablet by mouth  once daily, Disp: 90 tablet, Rfl: 3   TRULICITY 4.5 YH/0.6CB SOPN, INJECT 4.5 MG INTO THE SKIN AS DIRECTED ONCE A WEEK, Disp: 12 mL, Rfl: 0   XIIDRA 5 % SOLN, Place 2 drops into both eyes 2 (two) times daily. , Disp: , Rfl:    No Known Allergies    The patient states she is status post hysterectomy.  No LMP recorded. Patient has had a hysterectomy.. Negative for Dysmenorrhea and Negative for Menorrhagia. Negative for: breast discharge, breast lump(s), breast pain and breast self exam. Associated symptoms include abnormal vaginal bleeding. Pertinent negatives include abnormal bleeding (hematology), anxiety, decreased libido, depression, difficulty falling sleep, dyspareunia, history of infertility, nocturia, sexual dysfunction, sleep disturbances, urinary incontinence, urinary urgency, vaginal discharge and vaginal itching. Diet regular - she is trying to  eat healthy. The patient states her exercise level is moderate with daily walking.   The patient's tobacco use is:  Social History   Tobacco Use  Smoking Status Former   Packs/day: 0.50   Years: 60.00   Total pack years: 30.00   Types: Cigarettes  Smokeless Tobacco Never   She has been exposed to passive smoke. The patient's alcohol use is:  Social History   Substance and Sexual Activity  Alcohol Use No    Review of Systems  Constitutional: Negative.   HENT: Negative.    Eyes: Negative.   Respiratory: Negative.    Cardiovascular: Negative.   Gastrointestinal: Negative.   Endocrine: Negative.   Genitourinary: Negative.   Musculoskeletal: Negative.   Skin: Negative.   Allergic/Immunologic: Negative.   Neurological: Negative.   Hematological: Negative.   Psychiatric/Behavioral: Negative.       Today's Vitals   07/01/22 0901  BP: 120/70  Pulse: 73  Temp: 97.8 F (36.6 C)  TempSrc: Oral  Weight: 147 lb 6.4 oz (66.9 kg)  Height: _0  (1.575 m)  PainSc: 0-No pain   Body mass index is 26.96 kg/m.  Wt Readings from Last 3 Encounters:  07/01/22 147 lb 6.4 oz (66.9 kg)  04/01/22 147 lb (66.7 kg)  12/30/21 149 lb 3.2 oz (67.7 kg)    Objective:  Physical Exam Vitals reviewed.  Constitutional:      General: She is not in acute distress.    Appearance: Normal appearance. She is well-developed.  HENT:     Head: Normocephalic and atraumatic.     Right Ear: Hearing, tympanic membrane, ear canal and external ear normal. There is no impacted cerumen.     Left Ear: Hearing, tympanic membrane, ear canal and external ear normal. There is no impacted cerumen.     Nose:     Comments: Deferred - masked    Mouth/Throat:     Lips: Pink.     Mouth: Mucous membranes are moist.     Tongue: No lesions.     Tonsils: 1+ on the right. 1+ on the left.     Comments: Dentures upper and lower.  Eyes:     General: Lids are normal.     Extraocular Movements: Extraocular movements  intact.     Conjunctiva/sclera: Conjunctivae normal.     Pupils: Pupils are equal, round, and reactive to light.     Funduscopic exam:    Right eye: No papilledema.        Left eye: No papilledema.  Neck:     Thyroid: No thyroid mass.     Vascular: No carotid bruit.  Cardiovascular:     Rate and Rhythm: Normal rate  and regular rhythm.     Pulses: Normal pulses.     Heart sounds: Normal heart sounds. No murmur heard. Pulmonary:     Effort: Pulmonary effort is normal. No respiratory distress.     Breath sounds: Normal breath sounds. No wheezing.  Chest:     Chest wall: No mass.  Breasts:    Tanner Score is 5.     Right: Normal. No mass or tenderness.     Left: Normal. No mass or tenderness.  Abdominal:     General: Abdomen is flat. Bowel sounds are normal. There is no distension.     Palpations: Abdomen is soft.     Tenderness: There is no abdominal tenderness.  Musculoskeletal:        General: No swelling or tenderness. Normal range of motion.     Cervical back: Full passive range of motion without pain, normal range of motion and neck supple.     Right lower leg: No edema.     Left lower leg: No edema.  Lymphadenopathy:     Upper Body:     Right upper body: No supraclavicular, axillary or pectoral adenopathy.     Left upper body: No supraclavicular, axillary or pectoral adenopathy.  Skin:    General: Skin is warm and dry.     Capillary Refill: Capillary refill takes less than 2 seconds.  Neurological:     General: No focal deficit present.     Mental Status: She is alert and oriented to person, place, and time.     Cranial Nerves: No cranial nerve deficit.     Sensory: No sensory deficit.  Psychiatric:        Mood and Affect: Mood normal.        Behavior: Behavior normal.        Thought Content: Thought content normal.        Judgment: Judgment normal.         Assessment And Plan:     1. Annual physical exam Behavior modifications discussed and diet history  reviewed.   Pt will continue to exercise regularly and modify diet with low GI, plant based foods and decrease intake of processed foods.  Recommend intake of daily multivitamin, Vitamin D, and calcium.  Recommend mammogram for preventive screenings, as well as recommend immunizations that include influenza, TDAP, and Shingles (up to date).   2. Type 2 diabetes mellitus with stage 3b chronic kidney disease, without long-term current use of insulin ([place]) Comments: HgbA1c is improving, will recheck HgbA1c today. Continue current medications, tolerating well. Sent letter to Dr Venetia Maxon for her eye exam results - POCT Urinalysis Dipstick (81002) - Microalbumin / Creatinine Urine Ratio - Hemoglobin A1c  3. Benign hypertension with CKD (chronic kidney disease) stage III (HCC) Comments: Blood pressure is well controlled, continue current medications. EKG done with NSR HR 68 - EKG 12-Lead - CBC no Diff - CMP14+EGFR  4. Dyslipidemia Comments: Well controlled, continue statin, tolerating well  5. Vitamin D deficiency Will check vitamin D level and supplement as needed.    Also encouraged to spend 15 minutes in the sun daily.  - Vitamin D (25 hydroxy)  6. Acquired hypothyroidism Comments: Well controlled, continue current medications - TSH + free T4  7. Overweight (BMI 25.0-29.9) Comments: Continue focusing on exercising regularly and eating a healthy diet.   Patient was given opportunity to ask questions. Patient verbalized understanding of the plan and was able to repeat key elements of the plan. All questions  were answered to their satisfaction.   [name], FNP   I, [name], FNP, have reviewed all documentation for this visit. The documentation on 07/01/22 for the exam, diagnosis, procedures, and orders are all accurate and complete.   THE PATIENT IS ENCOURAGED TO PRACTICE SOCIAL DISTANCING DUE TO THE COVID-19 PANDEMIC.

## 2022-07-02 ENCOUNTER — Ambulatory Visit (INDEPENDENT_AMBULATORY_CARE_PROVIDER_SITE_OTHER): Payer: Medicare Other

## 2022-07-02 VITALS — Ht 62.0 in | Wt 147.0 lb

## 2022-07-02 DIAGNOSIS — Z0000 Encounter for general adult medical examination without abnormal findings: Secondary | ICD-10-CM

## 2022-07-02 NOTE — Patient Instructions (Signed)
[name] , Thank you for taking time to come for your Medicare Wellness Visit. I appreciate your ongoing commitment to your health goals. Please review the following plan we discussed and let me know if I can assist you in the future.   Screening recommendations/referrals: Colonoscopy: not required Mammogram: completed 05/27/2022, due 05/29/2023 Bone Density: completed 12/31/2020 Recommended yearly ophthalmology/optometry visit for glaucoma screening and checkup Recommended yearly dental visit for hygiene and checkup  Vaccinations: Influenza vaccine: completed 04/25/2022 Pneumococcal vaccine: completed 11/14/2020 Tdap vaccine: completed 09/13/2020, due 09/13/2030 Shingles vaccine: completed   Covid-19: 05/31/2022, 06/02/2021, 12/11/2020, 06/03/2020, 11/07/2019, 10/17/2019  Advanced directives: Advance directive discussed with you today.   Conditions/risks identified: none  Next appointment: Follow up in one year for your annual wellness visit    Preventive Care 65 Years and Older, Female Preventive care refers to lifestyle choices and visits with your health care provider that can promote health and wellness. What does preventive care include? A yearly physical exam. This is also called an annual well check. Dental exams once or twice a year. Routine eye exams. Ask your health care provider how often you should have your eyes checked. Personal lifestyle choices, including: Daily care of your teeth and gums. Regular physical activity. Eating a healthy diet. Avoiding tobacco and drug use. Limiting alcohol use. Practicing safe sex. Taking low-dose aspirin every day. Taking vitamin and mineral supplements as recommended by your health care provider. What happens during an annual well check? The services and screenings done by your health care provider during your annual well check will depend on your age, overall health, lifestyle risk factors, and family history of disease. Counseling   Your health care provider may ask you questions about your: Alcohol use. Tobacco use. Drug use. Emotional well-being. Home and relationship well-being. Sexual activity. Eating habits. History of falls. Memory and ability to understand (cognition). Work and work Statistician. Reproductive health. Screening  You may have the following tests or measurements: Height, weight, and BMI. Blood pressure. Lipid and cholesterol levels. These may be checked every 5 years, or more frequently if you are over 90 years old. Skin check. Lung cancer screening. You may have this screening every year starting at age 31 if you have a 30-pack-year history of smoking and currently smoke or have quit within the past 15 years. Fecal occult blood test (FOBT) of the stool. You may have this test every year starting at age 8. Flexible sigmoidoscopy or colonoscopy. You may have a sigmoidoscopy every 5 years or a colonoscopy every 10 years starting at age 7. Hepatitis C blood test. Hepatitis B blood test. Sexually transmitted disease (STD) testing. Diabetes screening. This is done by checking your blood sugar (glucose) after you have not eaten for a while (fasting). You may have this done every 1-3 years. Bone density scan. This is done to screen for osteoporosis. You may have this done starting at age 95. Mammogram. This may be done every 1-2 years. Talk to your health care provider about how often you should have regular mammograms. Talk with your health care provider about your test results, treatment options, and if necessary, the need for more tests. Vaccines  Your health care provider may recommend certain vaccines, such as: Influenza vaccine. This is recommended every year. Tetanus, diphtheria, and acellular pertussis (Tdap, Td) vaccine. You may need a Td booster every 10 years. Zoster vaccine. You may need this after age 60. Pneumococcal 13-valent conjugate (PCV13) vaccine. One dose is recommended  after age 18.  Pneumococcal polysaccharide (PPSV23) vaccine. One dose is recommended after age 76. Talk to your health care provider about which screenings and vaccines you need and how often you need them. This information is not intended to replace advice given to you by your health care provider. Make sure you discuss any questions you have with your health care provider. Document Released: 09/06/2015 Document Revised: 04/29/2016 Document Reviewed: 06/11/2015 Elsevier Interactive Patient Education  2017 [employer] Prevention in the Home Falls can cause injuries. They can happen to people of all ages. There are many things you can do to make your home safe and to help prevent falls. What can I do on the outside of my home? Regularly fix the edges of walkways and driveways and fix any cracks. Remove anything that might make you trip as you walk through a door, such as a raised step or threshold. Trim any bushes or trees on the path to your home. Use bright outdoor lighting. Clear any walking paths of anything that might make someone trip, such as rocks or tools. Regularly check to see if handrails are loose or broken. Make sure that both sides of any steps have handrails. Any raised decks and porches should have guardrails on the edges. Have any leaves, snow, or ice cleared regularly. Use sand or salt on walking paths during winter. Clean up any spills in your garage right away. This includes oil or grease spills. What can I do in the bathroom? Use night lights. Install grab bars by the toilet and in the tub and shower. Do not use towel bars as grab bars. Use non-skid mats or decals in the tub or shower. If you need to sit down in the shower, use a plastic, non-slip stool. Keep the floor dry. Clean up any water that spills on the floor as soon as it happens. Remove soap buildup in the tub or shower regularly. Attach bath mats securely with double-sided non-slip rug tape. Do not  have throw rugs and other things on the floor that can make you trip. What can I do in the bedroom? Use night lights. Make sure that you have a light by your bed that is easy to reach. Do not use any sheets or blankets that are too big for your bed. They should not hang down onto the floor. Have a firm chair that has side arms. You can use this for support while you get dressed. Do not have throw rugs and other things on the floor that can make you trip. What can I do in the kitchen? Clean up any spills right away. Avoid walking on wet floors. Keep items that you use a lot in easy-to-reach places. If you need to reach something above you, use a strong step stool that has a grab bar. Keep electrical cords out of the way. Do not use floor polish or wax that makes floors slippery. If you must use wax, use non-skid floor wax. Do not have throw rugs and other things on the floor that can make you trip. What can I do with my stairs? Do not leave any items on the stairs. Make sure that there are handrails on both sides of the stairs and use them. Fix handrails that are broken or loose. Make sure that handrails are as long as the stairways. Check any carpeting to make sure that it is firmly attached to the stairs. Fix any carpet that is loose or worn. Avoid having throw rugs at the top  or bottom of the stairs. If you do have throw rugs, attach them to the floor with carpet tape. Make sure that you have a light switch at the top of the stairs and the bottom of the stairs. If you do not have them, ask someone to add them for you. What else can I do to help prevent falls? Wear shoes that: Do not have high heels. Have rubber bottoms. Are comfortable and fit you well. Are closed at the toe. Do not wear sandals. If you use a stepladder: Make sure that it is fully opened. Do not climb a closed stepladder. Make sure that both sides of the stepladder are locked into place. Ask someone to hold it for  you, if possible. Clearly mark and make sure that you can see: Any grab bars or handrails. First and last steps. Where the edge of each step is. Use tools that help you move around (mobility aids) if they are needed. These include: Canes. Walkers. Scooters. Crutches. Turn on the lights when you go into a dark area. Replace any light bulbs as soon as they burn out. Set up your furniture so you have a clear path. Avoid moving your furniture around. If any of your floors are uneven, fix them. If there are any pets around you, be aware of where they are. Review your medicines with your doctor. Some medicines can make you feel dizzy. This can increase your chance of falling. Ask your doctor what other things that you can do to help prevent falls. This information is not intended to replace advice given to you by your health care provider. Make sure you discuss any questions you have with your health care provider. Document Released: 06/06/2009 Document Revised: 01/16/2016 Document Reviewed: 09/14/2014 Elsevier Interactive Patient Education  2017 [employer].

## 2022-07-02 NOTE — Progress Notes (Signed)
I connected with Miriah Maruyama today by telephone and verified that I am speaking with the correct person using two identifiers. Location patient: home Location provider: work Persons participating in the virtual visit: Ravinder Hofland, Glenna Durand LPN.   I discussed the limitations, risks, security and privacy concerns of performing an evaluation and management service by telephone and the availability of in person appointments. I also discussed with the patient that there may be a patient responsible charge related to this service. The patient expressed understanding and verbally consented to this telephonic visit.    Interactive audio and video telecommunications were attempted between this provider and patient, however failed, due to patient having technical difficulties OR patient did not have access to video capability.  We continued and completed visit with audio only.     Vital signs may be patient reported or missing.  Subjective:   [name] is a 87 y.o. female who presents for Medicare Annual (Subsequent) preventive examination.  Review of Systems     Cardiac Risk Factors include: advanced age (>53mn, >>44women);diabetes mellitus;dyslipidemia;hypertension     Objective:    Today's Vitals   07/02/22 1400  Weight: 147 lb (66.7 kg)  Height: '5\' 2"'$  (1.575 m)   Body mass index is 26.89 kg/m.     07/02/2022    2:03 PM 12/26/2021   11:08 AM 06/19/2021    8:30 AM 06/12/2020   10:09 AM 09/01/2019    3:54 AM 08/31/2019   11:03 AM 06/15/2018    5:54 PM  Advanced Directives  Does Patient Have a Medical Advance Directive? No No No No No No No  Would patient like information on creating a medical advance directive?  Yes (MAU/Ambulatory/Procedural Areas - Information given)  No - Patient declined Yes (ED - Information included in AVS) Yes (ED - Information included in AVS) No - Patient declined    Current Medications (verified) Outpatient Encounter Medications as of 07/02/2022   Medication Sig   Accu-Chek FastClix Lancets MISC USE AS DIRECTED FOR DAILY TESTING   ACCU-CHEK GUIDE test strip USE AS DIRECTED TO  CHECK  BLOOD  SUGAR  DAILY   acetaminophen (TYLENOL) 325 MG tablet Take by mouth every 6 (six) hours as needed for mild pain.   amLODipine-olmesartan (AZOR) 5-20 MG tablet TAKE 1 TABLET BY MOUTH ONCE DAILY IN THE MORNING   ANORO ELLIPTA 62.5-25 MCG/ACT AEPB Inhale 1 puff by mouth once daily   aspirin EC 81 MG tablet Take 1 tablet (81 mg total) by mouth daily. Swallow whole.   Cholecalciferol (VITAMIN D3) 10 MCG (400 UNIT) tablet Take 400 Units by mouth daily. Per patient taking 2000 iu   Ergocalciferol (VITAMIN D2 PO)    Insulin Pen Needle (PEN NEEDLES 29GX1/2") 29G X 12MM MISC    JARDIANCE 10 MG TABS tablet TAKE 1 TABLET BY MOUTH ONCE DAILY BEFORE BREAKFAST   LANTUS SOLOSTAR 100 UNIT/ML Solostar Pen INJECT 30 UNITS SUBCUTANEOUSLY ONCE DAILY   levothyroxine (SYNTHROID) 50 MCG tablet Take 1 tablet by mouth once daily   meclizine (ANTIVERT) 25 MG tablet TAKE 1 TABLET BY MOUTH ONCE DAILY AS NEEDED FOR DIZZINESS   metoprolol succinate (TOPROL-XL) 25 MG 24 hr tablet Take 1 tablet by mouth once daily   NAPROXEN PO    nitroGLYCERIN (NITROSTAT) 0.4 MG SL tablet Place 1 tablet (0.4 mg total) under the tongue every 5 (five) minutes x 3 doses as needed for chest pain.   RELION PEN NEEDLE 31G/8MM 31G X 8 MM MISC  USE AS DIRECTED   rosuvastatin (CRESTOR) 20 MG tablet Take 1 tablet by mouth once daily   TRULICITY 4.5 AS/5.0NL SOPN INJECT 4.5 MG INTO THE SKIN AS DIRECTED ONCE A WEEK   XIIDRA 5 % SOLN Place 2 drops into both eyes 2 (two) times daily.    No facility-administered encounter medications on file as of 07/02/2022.    Allergies (verified) Patient has no known allergies.   History: Past Medical History:  Diagnosis Date   Arthritis    Asthma    CAD (coronary artery disease)    s/p NSTEMI 10/19 - LHC:  pRCA 99, oLCx 30 >> DES to RCA; Echo 10/19: Mild focal basal  septal hypertrophy, EF 60-65, inf-lat, inf and inf-sept HK, trivial MR, trivial TR   Diabetes mellitus without complication ([place])    DKA (diabetic ketoacidoses) 08/31/2019   Hypertension    Past Surgical History:  Procedure Laterality Date   ABDOMINAL HYSTERECTOMY  "many yrs ago"   AXILLARY LYMPH NODE BIOPSY Right 02/26/2015   Procedure: RIGHT AXILLARY LYMPH NODE BIOPSY;  Surgeon: Jackolyn Confer, MD;  Location: [facility];  Service: General;  Laterality: Right;   CHOLECYSTECTOMY     CORONARY STENT INTERVENTION N/A 06/15/2018   Procedure: CORONARY STENT INTERVENTION;  Surgeon: Wellington Hampshire, MD;  Location: [facility];  Service: Cardiovascular;  Laterality: N/A;   LEFT HEART CATH AND CORONARY ANGIOGRAPHY N/A 06/15/2018   Procedure: LEFT HEART CATH AND CORONARY ANGIOGRAPHY;  Surgeon: Wellington Hampshire, MD;  Location: [facility];  Service: Cardiovascular;  Laterality: N/A;   Family History  Problem Relation Age of Onset   Diabetes Mother    Heart disease Mother    Diabetes Father    Diabetes Sister    Heart disease Sister    Diabetes Brother    Social History   Socioeconomic History   Marital status: Married    Spouse name: Not on file   Number of children: Not on file   Years of education: Not on file   Highest education level: Not on file  Occupational History   Occupation: retired  Tobacco Use   Smoking status: Former    Packs/day: 0.50    Years: 60.00    Total pack years: 30.00    Types: Cigarettes   Smokeless tobacco: Never  Vaping Use   Vaping Use: Never used  Substance and Sexual Activity   Alcohol use: No   Drug use: No   Sexual activity: Not Currently  Other Topics Concern   Not on file  Social History Narrative   Not on file   Social Determinants of Health   Financial Resource Strain: [place]  (07/02/2022)   Overall Financial Resource Strain (CARDIA)    Difficulty of Paying Living Expenses: Not hard at all  Food Insecurity: No Food  Insecurity (07/02/2022)   Hunger Vital Sign    Worried About Running Out of Food in the Last Year: Never true    [place] in the Last Year: Never true  Transportation Needs: No Transportation Needs (07/02/2022)   PRAPARE - Hydrologist (Medical): No    Lack of Transportation (Non-Medical): No  Physical Activity: Sufficiently Active (07/02/2022)   Exercise Vital Sign    Days of Exercise per Week: 5 days    Minutes of Exercise per Session: 60 min  Stress: No Stress Concern Present (07/02/2022)   [place]  Feeling of Stress : Not at all  Social Connections: Not on file    Tobacco Counseling Counseling given: Not Answered   Clinical Intake:  Pre-visit preparation completed: Yes  Pain : No/denies pain     Nutritional Status: BMI 25 -29 Overweight Nutritional Risks: None Diabetes: Yes  How often do you need to have someone help you when you read instructions, pamphlets, or other written materials from your doctor or pharmacy?: 1 - Never  Diabetic? Yes Nutrition Risk Assessment:  Has the patient had any N/V/D within the last 2 months?  No  Does the patient have any non-healing wounds?  No  Has the patient had any unintentional weight loss or weight gain?  No   Diabetes:  Is the patient diabetic?  Yes  If diabetic, was a CBG obtained today?  No  Did the patient bring in their glucometer from home?  No  How often do you monitor your CBG's? daily   Financial Strains and Diabetes Management:  Are you having any financial strains with the device, your supplies or your medication? No .  Does the patient want to be seen by Chronic Care Management for management of their diabetes?  No  Would the patient like to be referred to a Nutritionist or for Diabetic Management?  No   Diabetic Exams:  Diabetic Eye Exam: Overdue for diabetic eye exam. Pt has been advised about the  importance in completing this exam. Patient advised to call and schedule an eye exam. Diabetic Foot Exam: Completed 09/03/2021   Interpreter Needed?: No  Information entered by :: NAllen LPN   Activities of Daily Living    07/02/2022    2:04 PM  In your present state of health, do you have any difficulty performing the following activities:  Hearing? 0  Vision? 0  Difficulty concentrating or making decisions? 0  Walking or climbing stairs? 1  Comment has vertigo  Dressing or bathing? 0  Doing errands, shopping? 0  Preparing Food and eating ? N  Using the Toilet? N  In the past six months, have you accidently leaked urine? Y  Do you have problems with loss of bowel control? N  Managing your Medications? N  Managing your Finances? N  Housekeeping or managing your Housekeeping? N    Patient Care Team: [name], FNP as PCP - General (General Practice) Sherren Mocha, MD as PCP - Cardiology (Cardiology) Jackolyn Confer, MD as Consulting Physician (General Surgery) Truitt Merle, MD as Consulting Physician (Hematology) Marylynn Pearson, MD as Consulting Physician (Ophthalmology)  Indicate any recent Medical Services you may have received from other than [facility] providers in the past year (date may be approximate).     Assessment:   This is a routine wellness examination for [place].  Hearing/Vision screen Vision Screening - Comments:: Regular eye exams, Dr. Venetia Maxon  Dietary issues and exercise activities discussed: Current Exercise Habits: Home exercise routine, Type of exercise: walking, Time (Minutes): 60, Frequency (Times/Week): 5, Weekly Exercise (Minutes/Week): 300   Goals Addressed             This Visit's Progress    Patient Stated       07/02/2022, no goals       Depression Screen    07/02/2022    2:04 PM 07/01/2022    9:04 AM 04/01/2022    8:30 AM 06/19/2021    8:31 AM 06/12/2020   10:11 AM 12/11/2019   11:21 AM 09/07/2018   10:39 AM  PHQ 2/9 Scores  PHQ  - 2 Score 0 0 0 0 0 0 0    Fall Risk    07/02/2022    2:03 PM 07/01/2022    9:04 AM 04/01/2022    8:30 AM 06/19/2021    8:31 AM 06/12/2020   10:09 AM  Fall Risk   Falls in the past year? 0 0 0 0 1  Comment     fell out of the shower, due to vertigo  Number falls in past yr: 0 0 0  1  Injury with Fall? 0 0 0  0  Risk for fall due to : No Fall Risks;Medication side effect No Fall Risks No Fall Risks Medication side effect Medication side effect;History of fall(s)  Follow up Falls prevention discussed;Education provided;Falls evaluation completed Falls evaluation completed Falls evaluation completed Falls evaluation completed;Education provided;Falls prevention discussed Falls evaluation completed;Education provided;Falls prevention discussed    FALL RISK PREVENTION PERTAINING TO THE HOME:  Any stairs in or around the home? Yes  If so, are there any without handrails? No  Home free of loose throw rugs in walkways, pet beds, electrical cords, etc? Yes  Adequate lighting in your home to reduce risk of falls? Yes   ASSISTIVE DEVICES UTILIZED TO PREVENT FALLS:  Life alert? No  Use of a cane, walker or w/c? No  Grab bars in the bathroom? No  Shower chair or bench in shower? No  Elevated toilet seat or a handicapped toilet? Yes   TIMED UP AND GO:  Was the test performed? No .       Cognitive Function:        07/02/2022    2:05 PM 06/19/2021    8:32 AM 06/12/2020   10:13 AM  6CIT Screen  What Year? 0 points 0 points 0 points  What month? 0 points 0 points 0 points  What time? 0 points 0 points 0 points  Count back from 20 0 points 0 points 0 points  Months in reverse 0 points 0 points 0 points  Repeat phrase 0 points 4 points 2 points  Total Score 0 points 4 points 2 points    Immunizations Immunization History  Administered Date(s) Administered   Influenza-Unspecified 06/24/2018, 04/22/2020, 04/25/2022   PFIZER Comirnaty(Bottcher Top)Covid-19 Tri-Sucrose Vaccine  12/11/2020   PFIZER(Purple Top)SARS-COV-2 Vaccination 10/17/2019, 11/07/2019, 06/03/2020, 06/02/2021   PNEUMOCOCCAL CONJUGATE-20 11/14/2020   Pfizer Covid-19 Vaccine Bivalent Booster 4yr & up 05/31/2022   Pneumococcal Conjugate-13 04/11/2014   Pneumococcal Polysaccharide-23 05/16/2015   Tdap 09/13/2020   Zoster Recombinat (Shingrix) 05/04/2017, 07/04/2017   Zoster, Live 04/11/2014    TDAP status: Up to date  Flu Vaccine status: Up to date  Pneumococcal vaccine status: Up to date  Covid-19 vaccine status: Completed vaccines  Qualifies for Shingles Vaccine? Yes   Zostavax completed Yes   Shingrix Completed?: Yes  Screening Tests Health Maintenance  Topic Date Due   Medicare Annual Wellness (AWV)  06/19/2022   OPHTHALMOLOGY EXAM  06/11/2022   COVID-19 Vaccine (7 - Pfizer risk series) 07/26/2022   FOOT EXAM  09/03/2022   HEMOGLOBIN A1C  10/02/2022   Diabetic kidney evaluation - Urine ACR  12/19/2022   Diabetic kidney evaluation - GFR measurement  04/02/2023   TETANUS/TDAP  09/13/2030   Pneumonia Vaccine 87 Years old  Completed   INFLUENZA VACCINE  Completed   DEXA SCAN  Completed   Zoster Vaccines- Shingrix  Completed   HPV VACCINES  Aged Out    Health Maintenance  Health Maintenance Due  Topic Date Due   Medicare Annual Wellness (AWV)  06/19/2022   OPHTHALMOLOGY EXAM  06/11/2022    Colorectal cancer screening: No longer required.   Mammogram status: Completed 05/27/2022. Repeat every year  Bone Density status: Completed 12/31/2020.   Lung Cancer Screening: (Low Dose CT Chest recommended if Age 62-80 years, 30 pack-year currently smoking OR have quit w/in 15years.) does not qualify.   Lung Cancer Screening Referral: no  Additional Screening:  Hepatitis C Screening: does not qualify;   Vision Screening: Recommended annual ophthalmology exams for early detection of glaucoma and other disorders of the eye. Is the patient up to date with their annual eye exam?   Yes  Who is the provider or what is the name of the office in which the patient attends annual eye exams? Dr. Venetia Maxon If pt is not established with a provider, would they like to be referred to a provider to establish care? No .   Dental Screening: Recommended annual dental exams for proper oral hygiene  Community Resource Referral / Chronic Care Management: CRR required this visit?  No   CCM required this visit?  No      Plan:     I have personally reviewed and noted the following in the patient's chart:   Medical and social history Use of alcohol, tobacco or illicit drugs  Current medications and supplements including opioid prescriptions. Patient is not currently taking opioid prescriptions. Functional ability and status Nutritional status Physical activity Advanced directives List of other physicians Hospitalizations, surgeries, and ER visits in previous 12 months Vitals Screenings to include cognitive, depression, and falls Referrals and appointments  In addition, I have reviewed and discussed with patient certain preventive protocols, quality metrics, and best practice recommendations. A written personalized care plan for preventive services as well as general preventive health recommendations were provided to patient.     Kellie Simmering, LPN   37/04/239   Nurse Notes: none  Due to this being a virtual visit, the after visit summary with patients personalized plan was offered to patient via mail or my-chart.  Patient would like to access on my-chart

## 2022-07-03 LAB — CMP14+EGFR
ALT: 21 IU/L (ref 0–32)
AST: 21 IU/L (ref 0–40)
Albumin/Globulin Ratio: 1.4 (ref 1.2–2.2)
Albumin: 4.2 g/dL (ref 3.7–4.7)
Alkaline Phosphatase: 84 IU/L (ref 44–121)
BUN/Creatinine Ratio: 8 — ABNORMAL LOW (ref 12–28)
BUN: 9 mg/dL (ref 8–27)
Bilirubin Total: 0.8 mg/dL (ref 0.0–1.2)
CO2: 21 mmol/L (ref 20–29)
Calcium: 9.5 mg/dL (ref 8.7–10.3)
Chloride: 106 mmol/L (ref 96–106)
Creatinine, Ser: 1.17 mg/dL — ABNORMAL HIGH (ref 0.57–1.00)
Globulin, Total: 3 g/dL (ref 1.5–4.5)
Glucose: 85 mg/dL (ref 70–99)
Potassium: 4.4 mmol/L (ref 3.5–5.2)
Sodium: 143 mmol/L (ref 134–144)
Total Protein: 7.2 g/dL (ref 6.0–8.5)
eGFR: 46 mL/min/{1.73_m2} — ABNORMAL LOW (ref >59)

## 2022-07-03 LAB — CBC
Hematocrit: 42.9 % (ref 34.0–46.6)
Hemoglobin: 13.7 g/dL (ref 11.1–15.9)
MCH: 27.8 pg (ref 26.6–33.0)
MCHC: 31.9 g/dL (ref 31.5–35.7)
MCV: 87 fL (ref 79–97)
Platelets: 217 10*3/uL (ref 150–450)
RBC: 4.92 x10E6/uL (ref 3.77–5.28)
RDW: 15.2 % (ref 11.7–15.4)
WBC: 7.6 10*3/uL (ref 3.4–10.8)

## 2022-07-03 LAB — TSH+FREE T4
Free T4: 1.27 ng/dL (ref 0.82–1.77)
TSH: 1.58 u[IU]/mL (ref 0.450–4.500)

## 2022-07-03 LAB — VITAMIN D 25 HYDROXY (VIT D DEFICIENCY, FRACTURES): Vit D, 25-Hydroxy: 59.1 ng/mL (ref 30.0–100.0)

## 2022-07-03 LAB — HEMOGLOBIN A1C
Est. average glucose Bld gHb Est-mCnc: 194 mg/dL
Hgb A1c MFr Bld: 8.4 % — ABNORMAL HIGH (ref 4.8–5.6)

## 2022-07-03 LAB — MICROALBUMIN / CREATININE URINE RATIO
Creatinine, Urine: 186 mg/dL
Microalb/Creat Ratio: 12 mg/g creat (ref 0–29)
Microalbumin, Urine: 22.3 ug/mL

## 2022-07-06 ENCOUNTER — Other Ambulatory Visit: Payer: Self-pay | Admitting: Nurse Practitioner

## 2022-07-06 DIAGNOSIS — Z794 Long term (current) use of insulin: Secondary | ICD-10-CM

## 2022-07-06 MED ORDER — EMPAGLIFLOZIN 25 MG PO TABS: 25.0000 mg | ORAL_TABLET | Freq: Every day | ORAL | 1 refills | Status: DC

## 2022-07-13 ENCOUNTER — Other Ambulatory Visit: Payer: Self-pay | Admitting: Nurse Practitioner

## 2022-07-19 ENCOUNTER — Other Ambulatory Visit: Payer: Self-pay | Admitting: Nurse Practitioner

## 2022-07-20 ENCOUNTER — Other Ambulatory Visit: Payer: Self-pay | Admitting: Nurse Practitioner

## 2022-07-21 ENCOUNTER — Other Ambulatory Visit: Payer: Self-pay | Admitting: Nurse Practitioner

## 2022-07-30 ENCOUNTER — Other Ambulatory Visit: Payer: Self-pay | Admitting: Nurse Practitioner

## 2022-08-01 ENCOUNTER — Other Ambulatory Visit: Payer: Self-pay | Admitting: Nurse Practitioner

## 2022-08-05 ENCOUNTER — Ambulatory Visit: Payer: Self-pay

## 2022-08-05 NOTE — Patient Instructions (Signed)
Visit Information  Thank you for taking time to visit with me today. Please don't hesitate to contact me if I can be of assistance to you.   Following are the goals we discussed today:   Goals Addressed             This Visit's Progress    COMPLETED: Care Coordination Activities       Care Coordination Interventions: SDoH screening performed - no acute resource challenges identified Determined the patient does not have concerns with medication costs and/or adherence Performed chart review to note patient A1C is 8.4 Education provided on  the role of the care coordination team- patient agreeable to speak with RN Care Manager for disease management Scheduled telephone visit with [place] on 08/26/22         Your next appointment is by telephone on 08/26/22 at 12:30 with [place]  Please call the care guide team at [phone] if you need to cancel or reschedule your appointment.   If you are experiencing a Mental Health or [place] or need someone to talk to, please call 1-800-273-TALK (toll free, 24 hour hotline) call 911  Patient verbalizes understanding of instructions and care plan provided today and agrees to view in [place]. Active MyChart status and patient understanding of how to access instructions and care plan via MyChart confirmed with patient.     Telephone follow up appointment with care management team member scheduled for:08/26/22  Daneen Schick, Arita Miss, CDP Social Worker, Certified Dementia Practitioner [facility]  Care Coordination [phone]

## 2022-08-05 NOTE — Patient Outreach (Signed)
  Care Coordination   Initial Visit Note   08/05/2022 Name: [name] MRN: [id number] DOB: 1935/05/01  [name] is a 87 y.o. year old female who sees [name], FNP for primary care. I spoke with  [name] by phone today.  What matters to the patients health and wellness today?  I would like to lower my A1C    Goals Addressed             This Visit's Progress    COMPLETED: Care Coordination Activities       Care Coordination Interventions: SDoH screening performed - no acute resource challenges identified Determined the patient does not have concerns with medication costs and/or adherence Performed chart review to note patient A1C is 8.4 Education provided on  the role of the care coordination team- patient agreeable to speak with [place] for disease management Scheduled telephone visit with [place] on 08/26/22         SDOH assessments and interventions completed:  Yes  SDOH Interventions Today    Flowsheet Row Most Recent Value  SDOH Interventions   Food Insecurity Interventions Intervention Not Indicated  Housing Interventions Intervention Not Indicated  Transportation Interventions Intervention Not Indicated  Utilities Interventions Intervention Not Indicated        Care Coordination Interventions:  Yes, provided   Follow up plan: Follow up call scheduled for 08/26/22 for RN Care Manager    Encounter Outcome:  Pt. Visit Completed   Daneen Schick, Arita Miss, CDP Social Worker, Certified Dementia Practitioner [facility] Management  Care Coordination [phone]

## 2022-08-18 ENCOUNTER — Other Ambulatory Visit: Payer: Self-pay | Admitting: Nurse Practitioner

## 2022-08-26 ENCOUNTER — Ambulatory Visit: Payer: Self-pay

## 2022-08-26 NOTE — Patient Outreach (Signed)
  Care Coordination   08/26/2022 Name: Yuval Nolet MRN: [id number] DOB: 01-17-1935   Care Coordination Outreach Attempts:  An unsuccessful telephone outreach was attempted for a scheduled appointment today.  Follow Up Plan:  Additional outreach attempts will be made to offer the patient care coordination information and services.   Encounter Outcome:  Pt. Request to Call Back   Care Coordination Interventions:  No, not indicated    Barb Merino, RN, BSN, CCM Care Management Coordinator [facility]  Direct Phone: [phone]

## 2022-09-02 NOTE — Progress Notes (Signed)
Rescheduled Initial RNCM call 09/21/22  [place]  Direct Dial: [phone]

## 2022-09-13 ENCOUNTER — Other Ambulatory Visit: Payer: Self-pay | Admitting: Nurse Practitioner

## 2022-09-13 DIAGNOSIS — E118 Type 2 diabetes mellitus with unspecified complications: Secondary | ICD-10-CM

## 2022-09-14 ENCOUNTER — Other Ambulatory Visit: Payer: Self-pay | Admitting: Nurse Practitioner

## 2022-09-17 ENCOUNTER — Other Ambulatory Visit: Payer: Self-pay | Admitting: Nurse Practitioner

## 2022-09-21 ENCOUNTER — Ambulatory Visit: Payer: Self-pay

## 2022-09-21 NOTE — Patient Outreach (Signed)
  Care Coordination   09/21/2022 Name: Jnyah Brazee MRN: [id number] DOB: 15-Mar-1935   Care Coordination Outreach Attempts:  An unsuccessful telephone outreach was attempted for a scheduled appointment today.  Follow Up Plan:  Additional outreach attempts will be made to offer the patient care coordination information and services.   Encounter Outcome:  No Answer   Care Coordination Interventions:  No, not indicated    Barb Merino, RN, BSN, CCM Care Management Coordinator [facility] Management  Direct Phone: [phone]

## 2022-09-23 ENCOUNTER — Other Ambulatory Visit: Payer: Self-pay | Admitting: Nurse Practitioner

## 2022-09-29 NOTE — Progress Notes (Unsigned)
Cardiology Office Note:    Date:  09/30/2022   ID:  [name], DOB 05/04/1935, MRN [id number]  PCP:  [name], [place] Prov[facility] Cardiologist:  Sherren Mocha, MD    Referring MD: [name], FNP   Patient Profile: Coronary artery disease NSTEMI 05/2018 s/p 2.5 x 18 mm DES to the proximal RCA TTE 06/16/18: Mild focal basal septal hypertrophy, EF 60-65, inf-lat, inf and inf-sept HK, trivial MR, trivial TR Diabetes mellitus Hypertension Hyperlipidemia Lymphoproliferative disorder Chronic kidney disease       History of Present Illness:   [name] is a 88 y.o. female with the above problem list.  She was last seen by Dr. Burt Knack in 09/2021. She returns for f/u of coronary artery disease. She denies chest pain, shortness of breath, syncope, orthopnea, PND or significant pedal edema.  She continues to walk on a daily basis.  She watches her grandson's dog but he is too big now for her to walk him.  EKG 07/01/2022 personally reviewed and interpreted: NSR, HR 68, normal axis, no ST-T wave changes, J-point elevation, QTc 410, no change from prior tracing     Reviewed and updated this encounter:  Tobacco  Allergies  Meds  Problems  Med Hx  Surg Hx  Fam Hx     Review of Systems  Cardiovascular:  Negative for claudication.    Labs/Other Test Reviewed:   Recent Labs: 07/01/2022: ALT 21; BUN 9; Creatinine, Ser 1.17; Hemoglobin 13.7; Platelets 217; Potassium 4.4; Sodium 143; TSH 1.580   Recent Lipid Panel Recent Labs    04/01/22 0918  CHOL 122  TRIG 108  HDL 49  LDLCALC 53     Risk Assessment/Calculations/Metrics:         HYPERTENSION CONTROL Vitals:   09/30/22 0852 09/30/22 0942  BP: (!) 158/60 (!) 142/76    The patient's blood pressure is elevated above target today.  In order to address the patient's elevated BP: Blood pressure will be monitored at home to determine if medication changes need to be made.      Physical  Exam:   VS:  BP (!) 142/76   Pulse 78   Ht '5\' 2"'$  (1.575 m)   Wt 150 lb 12.8 oz (68.4 kg)   SpO2 97%   BMI 27.58 kg/m    Wt Readings from Last 3 Encounters:  09/30/22 150 lb 12.8 oz (68.4 kg)  07/02/22 147 lb (66.7 kg)  07/01/22 147 lb 6.4 oz (66.9 kg)    Constitutional:      Appearance: Healthy appearance. Not in distress.  Neck:     Vascular: JVD normal.  Pulmonary:     Effort: Pulmonary effort is normal.     Breath sounds: No wheezing. No rales.  Cardiovascular:     Normal rate. Regular rhythm. Normal S1. Normal S2.      Murmurs: There is no murmur.  Edema:    Peripheral edema absent.  Abdominal:     Palpations: Abdomen is soft.         ASSESSMENT & PLAN:   CAD (coronary artery disease) Hx of NSTEMI in 2019 tx with DES to pRCA. EF was preserved. She was switched from Plavix to ASA last year. She continues to do well w/o symptoms of angina. She walks on a regular basis and remains active. Continue ASA 81 mg, Toprol XL 25 mg, Crestor 20 mg. F/u in 1 year.   Hypertension BP above goal today. She has not taken  any medications yet. Her BP at home is usually optimal. I suspect her BP is up today b/c she has not yet had her medications. Continue to monitor at home. She knows to contact us if her SBP is consistently above 140. Continue Azor 5/20 mg once daily, Toprol XL 25 mg once daily.   Hyperlipidemia LDL optimal on most recent lab work.  Continue current Rx with Crestor 20 mg once daily.            Dispo:  Return in about 1 year (around 10/01/2023) for Routine Follow Up, w/ Dr. Burt Knack.   Signed, Richardson Dopp, PA-C  09/30/2022 9:46 AM    [facility] [address], [place], [place]  [id number] Phone: [phone]; Fax: [phone]

## 2022-09-30 ENCOUNTER — Ambulatory Visit: Payer: 59 | Attending: Physician Assistant | Admitting: Physician Assistant

## 2022-09-30 ENCOUNTER — Encounter: Payer: Self-pay | Admitting: Physician Assistant

## 2022-09-30 VITALS — BP 142/76 | HR 78 | Ht 62.0 in | Wt 150.8 lb

## 2022-09-30 DIAGNOSIS — I2510 Atherosclerotic heart disease of native coronary artery without angina pectoris: Secondary | ICD-10-CM | POA: Diagnosis not present

## 2022-09-30 DIAGNOSIS — E7800 Pure hypercholesterolemia, unspecified: Secondary | ICD-10-CM | POA: Diagnosis not present

## 2022-09-30 DIAGNOSIS — I10 Essential (primary) hypertension: Secondary | ICD-10-CM | POA: Diagnosis not present

## 2022-09-30 NOTE — Assessment & Plan Note (Signed)
LDL optimal on most recent lab work.  Continue current Rx with Crestor 20 mg once daily.

## 2022-09-30 NOTE — Assessment & Plan Note (Signed)
BP above goal today. She has not taken any medications yet. Her BP at home is usually optimal. I suspect her BP is up today b/c she has not yet had her medications. Continue to monitor at home. She knows to contact us if her SBP is consistently above 140. Continue Azor 5/20 mg once daily, Toprol XL 25 mg once daily.

## 2022-09-30 NOTE — Patient Instructions (Signed)
Medication Instructions:  Your physician recommends that you continue on your current medications as directed. Please refer to the Current Medication list given to you today.  *If you need a refill on your cardiac medications before your next appointment, please call your pharmacy*   Lab Work: None ordered  If you have labs (blood work) drawn today and your tests are completely normal, you will receive your results only by: MyChart Message (if you have MyChart) OR A paper copy in the mail If you have any lab test that is abnormal or we need to change your treatment, we will call you to review the results.   Testing/Procedures: None ordered   Follow-Up: At [facility], you and your health needs are our priority.  As part of our continuing mission to provide you with exceptional heart care, we have created designated Provider Care Teams.  These Care Teams include your primary Cardiologist (physician) and Advanced Practice Providers (APPs -  Physician Assistants and Nurse Practitioners) who all work together to provide you with the care you need, when you need it.  We recommend signing up for the patient portal called "MyChart".  Sign up information is provided on this After Visit Summary.  MyChart is used to connect with patients for Virtual Visits (Telemedicine).  Patients are able to view lab/test results, encounter notes, upcoming appointments, etc.  Non-urgent messages can be sent to your provider as well.   To learn more about what you can do with MyChart, go to [email or web address].    Your next appointment:   1 year(s)  Provider:   Michael Cooper, MD     Other Instructions  

## 2022-09-30 NOTE — Assessment & Plan Note (Signed)
Hx of NSTEMI in 2019 tx with DES to pRCA. EF was preserved. She was switched from Plavix to ASA last year. She continues to do well w/o symptoms of angina. She walks on a regular basis and remains active. Continue ASA 81 mg, Toprol XL 25 mg, Crestor 20 mg. F/u in 1 year.

## 2022-10-08 ENCOUNTER — Other Ambulatory Visit: Payer: Self-pay | Admitting: Nurse Practitioner

## 2022-10-12 ENCOUNTER — Other Ambulatory Visit: Payer: Self-pay | Admitting: Cardiovascular Disease

## 2022-10-13 ENCOUNTER — Other Ambulatory Visit: Payer: Self-pay | Admitting: Nurse Practitioner

## 2022-10-13 DIAGNOSIS — H40023 Open angle with borderline findings, high risk, bilateral: Secondary | ICD-10-CM | POA: Diagnosis not present

## 2022-10-13 DIAGNOSIS — H2511 Age-related nuclear cataract, right eye: Secondary | ICD-10-CM | POA: Diagnosis not present

## 2022-10-13 DIAGNOSIS — H43813 Vitreous degeneration, bilateral: Secondary | ICD-10-CM | POA: Diagnosis not present

## 2022-10-13 DIAGNOSIS — E119 Type 2 diabetes mellitus without complications: Secondary | ICD-10-CM | POA: Diagnosis not present

## 2022-10-16 ENCOUNTER — Telehealth: Payer: Self-pay | Admitting: *Deleted

## 2022-10-16 ENCOUNTER — Encounter: Payer: Self-pay | Admitting: Podiatry

## 2022-10-16 ENCOUNTER — Ambulatory Visit (INDEPENDENT_AMBULATORY_CARE_PROVIDER_SITE_OTHER): Payer: 59 | Admitting: Podiatry

## 2022-10-16 VITALS — BP 162/92

## 2022-10-16 DIAGNOSIS — M2141 Flat foot [pes planus] (acquired), right foot: Secondary | ICD-10-CM

## 2022-10-16 DIAGNOSIS — E119 Type 2 diabetes mellitus without complications: Secondary | ICD-10-CM

## 2022-10-16 DIAGNOSIS — M2011 Hallux valgus (acquired), right foot: Secondary | ICD-10-CM

## 2022-10-16 DIAGNOSIS — M2042 Other hammer toe(s) (acquired), left foot: Secondary | ICD-10-CM | POA: Diagnosis not present

## 2022-10-16 DIAGNOSIS — E1142 Type 2 diabetes mellitus with diabetic polyneuropathy: Secondary | ICD-10-CM

## 2022-10-16 DIAGNOSIS — M79675 Pain in left toe(s): Secondary | ICD-10-CM

## 2022-10-16 DIAGNOSIS — M2142 Flat foot [pes planus] (acquired), left foot: Secondary | ICD-10-CM | POA: Diagnosis not present

## 2022-10-16 DIAGNOSIS — M2012 Hallux valgus (acquired), left foot: Secondary | ICD-10-CM | POA: Diagnosis not present

## 2022-10-16 DIAGNOSIS — M2041 Other hammer toe(s) (acquired), right foot: Secondary | ICD-10-CM | POA: Diagnosis not present

## 2022-10-16 DIAGNOSIS — M79674 Pain in right toe(s): Secondary | ICD-10-CM

## 2022-10-16 DIAGNOSIS — B351 Tinea unguium: Secondary | ICD-10-CM | POA: Diagnosis not present

## 2022-10-16 NOTE — Progress Notes (Signed)
ANNUAL DIABETIC FOOT EXAM  Subjective: [name] presents today {jgcomplaint:23593}.  Chief Complaint  Patient presents with   Nail Problem    DFC BS-101 A1C-8.? PCP-[name] PCP VST-do not remember   Patient confirms h/o diabetes.  Patient relates {Numbers; 0-100:15068} year h/o diabetes.  Patient denies any h/o foot wounds.  Patient has h/o foot ulcer of {jgPodToeLocator:23637}, which healed via help of ***.  Patient has h/o amputation(s): {jgamp:23617}.  Patient endorses symptoms of foot numbness.   Patient endorses symptoms of foot tingling.  Patient endorses symptoms of burning in feet.  Patient endorses symptoms of pins/needles sensation in feet.  Patient denies any numbness, tingling, burning, or pins/needle sensation in feet.  Patient has been diagnosed with neuropathy and it is managed with {JGNEUROPATHYMEDS:27053}.  Risk factors: {jgriskfactors:24044}.  Madeline Brine, FNP is patient's PCP. Last visit was {Time; dates multiple:15870}***.  Past Medical History:  Diagnosis Date   Arthritis    Asthma    CAD (coronary artery disease)    s/p NSTEMI 10/19 - LHC:  pRCA 99, oLCx 30 >> DES to RCA; Echo 10/19: Mild focal basal septal hypertrophy, EF 60-65, inf-lat, inf and inf-sept HK, trivial MR, trivial TR   Diabetes mellitus without complication ([place])    DKA (diabetic ketoacidoses) 08/31/2019   Hypertension    Patient Active Problem List   Diagnosis Date Noted   Screening for malignant neoplasm of colon 10/22/2020   Gastroesophageal reflux disease 10/22/2020   Acquired hypothyroidism 03/12/2020   COVID-19 virus infection 08/31/2019   CAD (coronary artery disease) 09/27/2018   Hypertension 06/16/2018   Hyperlipidemia 06/16/2018   Type 2 diabetes mellitus with complication, with long-term current use of insulin ([place]) 06/16/2018   Hx of NSTEMI 10/19 Tx with DES to pRCA 06/14/2018   Lymphoproliferative disease ([place]) 10/03/2015   Past Surgical  History:  Procedure Laterality Date   ABDOMINAL HYSTERECTOMY  "many yrs ago"   AXILLARY LYMPH NODE BIOPSY Right 02/26/2015   Procedure: RIGHT AXILLARY LYMPH NODE BIOPSY;  Surgeon: Madeline Confer, MD;  Location: [facility];  Service: General;  Laterality: Right;   CHOLECYSTECTOMY     CORONARY STENT INTERVENTION N/A 06/15/2018   Procedure: CORONARY STENT INTERVENTION;  Surgeon: Madeline Hampshire, MD;  Location: [facility];  Service: Cardiovascular;  Laterality: N/A;   LEFT HEART CATH AND CORONARY ANGIOGRAPHY N/A 06/15/2018   Procedure: LEFT HEART CATH AND CORONARY ANGIOGRAPHY;  Surgeon: Madeline Hampshire, MD;  Location: [facility];  Service: Cardiovascular;  Laterality: N/A;   Current Outpatient Medications on File Prior to Visit  Medication Sig Dispense Refill   Accu-Chek FastClix Lancets MISC USE AS DIRECTED FOR DAILY TESTING 102 each 0   ACCU-CHEK GUIDE test strip USE AS DIRECTED TO CHECK BLOOD SUGAR DAILY 100 each 0   acetaminophen (TYLENOL) 325 MG tablet Take by mouth every 6 (six) hours as needed for mild pain.     amLODipine-olmesartan (AZOR) 5-20 MG tablet TAKE 1 TABLET BY MOUTH ONCE DAILY IN THE MORNING 90 tablet 0   ANORO ELLIPTA 62.5-25 MCG/ACT AEPB Inhale 1 puff by mouth once daily 60 each 0   aspirin EC 81 MG tablet Take 1 tablet (81 mg total) by mouth daily. Swallow whole. 90 tablet 3   Cholecalciferol (VITAMIN D3) 10 MCG (400 UNIT) tablet Take 400 Units by mouth daily. Per patient taking 2000 iu     empagliflozin (JARDIANCE) 25 MG TABS tablet Take 1 tablet (25 mg total) by mouth daily before breakfast.  90 tablet 1   Ergocalciferol (VITAMIN D2 PO)      Insulin Pen Needle (PEN NEEDLES 29GX1/2") 29G X 12MM MISC      LANTUS SOLOSTAR 100 UNIT/ML Solostar Pen INJECT 32 UNITS SUBCUTANEOUSLY ONCE DAILY 15 mL 0   levothyroxine (SYNTHROID) 50 MCG tablet Take 1 tablet by mouth once daily 90 tablet 0   meclizine (ANTIVERT) 25 MG tablet TAKE 1 TABLET BY MOUTH ONCE DAILY AS NEEDED  FOR DIZZINESS 30 tablet 0   metoprolol succinate (TOPROL-XL) 25 MG 24 hr tablet Take 1 tablet by mouth once daily 90 tablet 3   NAPROXEN PO      nitroGLYCERIN (NITROSTAT) 0.4 MG SL tablet Place 1 tablet (0.4 mg total) under the tongue every 5 (five) minutes x 3 doses as needed for chest pain. 25 tablet 7   RELION PEN NEEDLE 31G/8MM 31G X 8 MM MISC USE AS DIRECTED 100 each 0   rosuvastatin (CRESTOR) 20 MG tablet Take 1 tablet by mouth once daily 90 tablet 3   TRULICITY 4.5 0000000 SOPN INJECT 4.5 MG INTO THE SKIN AS DIRECTED ONCE A WEEK 12 mL 0   XIIDRA 5 % SOLN Place 2 drops into both eyes 2 (two) times daily.      No current facility-administered medications on file prior to visit.    No Known Allergies Social History   Occupational History   Occupation: retired  Tobacco Use   Smoking status: Former    Packs/day: 0.50    Years: 60.00    Total pack years: 30.00    Types: Cigarettes   Smokeless tobacco: Never  Vaping Use   Vaping Use: Never used  Substance and Sexual Activity   Alcohol use: No   Drug use: No   Sexual activity: Not Currently   Family History  Problem Relation Age of Onset   Diabetes Mother    Heart disease Mother    Diabetes Father    Diabetes Sister    Heart disease Sister    Diabetes Brother    Immunization History  Administered Date(s) Administered   Influenza-Unspecified 06/24/2018, 04/22/2020, 04/25/2022   PFIZER Comirnaty([name])Covid-19 Tri-Sucrose Vaccine 12/11/2020   PFIZER(Purple [name])SARS-COV-2 Vaccination 10/17/2019, 11/07/2019, 06/03/2020, 06/02/2021   PNEUMOCOCCAL CONJUGATE-20 11/14/2020   Pfizer Covid-19 Vaccine Bivalent Booster 74yr & up 05/31/2022   Pneumococcal Conjugate-13 04/11/2014   Pneumococcal Polysaccharide-23 05/16/2015   Tdap 09/13/2020   Zoster Recombinat (Shingrix) 05/04/2017, 07/04/2017   Zoster, Live 04/11/2014     Review of Systems: Negative except as noted in the HPI.   Objective: Vitals:   10/16/22 0835  BP:  (!) 162/92    HDellora Joyceis a pleasant 88y.o. female in NAD. AAO X 3.  Vascular Examination: {jgvascular:23595}  Dermatological Examination: {jgderm:23598}  Neurological Examination: {jgneuro:23601::"Protective sensation intact 5/5 intact bilaterally with 10g monofilament b/l.","Vibratory sensation intact b/l.","Proprioception intact bilaterally."}  Musculoskeletal Examination: {jgmsk:23600}  Footwear Assessment: Does the patient wear appropriate shoes? {Yes,No}. Does the patient need inserts/orthotics? {Yes,No}.  Lab Results  Component Value Date   HGBA1C 8.4 (H) 07/01/2022   No results found. ADA Risk Categorization: Low Risk :  Patient has all of the following: Intact protective sensation No prior foot ulcer  No severe deformity Pedal pulses present  High Risk  Patient has one or more of the following: Loss of protective sensation Absent pedal pulses Severe Foot deformity History of foot ulcer  Assessment: 1. Pain due to onychomycosis of toenails of both feet   2. Hallux valgus,  acquired, bilateral   3. Pes planus of both feet   4. Diabetic peripheral neuropathy associated with type 2 diabetes mellitus ([place])   5. Encounter for diabetic foot exam ([place])      Plan: No orders of the defined types were placed in this encounter.   No orders of the defined types were placed in this encounter.   None  {jgplan:23602::"-Patient/POA to call should there be question/concern in the interim."} Return in about 3 months (around 01/14/2023).  Marzetta Board, DPM

## 2022-10-16 NOTE — Progress Notes (Unsigned)
  Care Coordination Note  10/16/2022 Name: [name] MRN: [id number] DOB: Jul 02, 1935  [name] is a 88 y.o. year old female who is a primary care patient of [name], [place] and is actively engaged with the care management team. I reached out to [name] by phone today to assist with re-scheduling an initial visit with the RN Case Manager  Follow up plan: Unsuccessful telephone outreach attempt made. A HIPAA compliant phone message was left for the patient providing contact information and requesting a return call.   [place]  Direct Dial: [phone]

## 2022-10-20 ENCOUNTER — Encounter: Payer: Self-pay | Admitting: Pharmacist

## 2022-10-20 NOTE — Progress Notes (Signed)
  Care Coordination Note  10/20/2022 Name: [name] MRN: [id number] DOB: 06-12-1935  [name] is a 88 y.o. year old female who is a primary care patient of [name], [place] and is actively engaged with the care management team. I reached out to [name] by phone today to assist with re-scheduling an initial visit with the RN Case Manager  Follow up plan: Patient declines further follow up and engagement by the care management team. Appropriate care team members and provider have been notified via electronic communication.   [place]  Direct Dial: [phone]

## 2022-10-20 NOTE — Telephone Encounter (Signed)
Thank you for the update!

## 2022-10-21 ENCOUNTER — Other Ambulatory Visit: Payer: Self-pay | Admitting: Nurse Practitioner

## 2022-10-24 ENCOUNTER — Other Ambulatory Visit: Payer: Self-pay | Admitting: Cardiovascular Disease

## 2022-10-24 DIAGNOSIS — I2510 Atherosclerotic heart disease of native coronary artery without angina pectoris: Secondary | ICD-10-CM

## 2022-10-30 ENCOUNTER — Other Ambulatory Visit: Payer: Self-pay | Admitting: Nurse Practitioner

## 2022-11-02 ENCOUNTER — Ambulatory Visit (INDEPENDENT_AMBULATORY_CARE_PROVIDER_SITE_OTHER): Payer: 59 | Admitting: Nurse Practitioner

## 2022-11-02 ENCOUNTER — Encounter: Payer: Self-pay | Admitting: Nurse Practitioner

## 2022-11-02 VITALS — BP 124/78 | HR 83 | Temp 98.0°F | Ht 62.0 in | Wt 149.0 lb

## 2022-11-02 DIAGNOSIS — E785 Hyperlipidemia, unspecified: Secondary | ICD-10-CM | POA: Diagnosis not present

## 2022-11-02 DIAGNOSIS — E039 Hypothyroidism, unspecified: Secondary | ICD-10-CM

## 2022-11-02 DIAGNOSIS — I119 Hypertensive heart disease without heart failure: Secondary | ICD-10-CM | POA: Diagnosis not present

## 2022-11-02 DIAGNOSIS — E559 Vitamin D deficiency, unspecified: Secondary | ICD-10-CM | POA: Diagnosis not present

## 2022-11-02 DIAGNOSIS — E1122 Type 2 diabetes mellitus with diabetic chronic kidney disease: Secondary | ICD-10-CM | POA: Diagnosis not present

## 2022-11-02 DIAGNOSIS — Z853 Personal history of malignant neoplasm of breast: Secondary | ICD-10-CM | POA: Diagnosis not present

## 2022-11-02 DIAGNOSIS — I2510 Atherosclerotic heart disease of native coronary artery without angina pectoris: Secondary | ICD-10-CM | POA: Diagnosis not present

## 2022-11-02 DIAGNOSIS — E663 Overweight: Secondary | ICD-10-CM

## 2022-11-02 DIAGNOSIS — N1832 Chronic kidney disease, stage 3b: Secondary | ICD-10-CM

## 2022-11-02 DIAGNOSIS — I10 Essential (primary) hypertension: Secondary | ICD-10-CM | POA: Diagnosis not present

## 2022-11-02 DIAGNOSIS — Z794 Long term (current) use of insulin: Secondary | ICD-10-CM

## 2022-11-02 NOTE — Progress Notes (Signed)
I,Sheena H Holbrook,acting as a Education administrator for [name], FNP.,have documented all relevant documentation on the behalf of [name], FNP,as directed by  [name], FNP while in the presence of [name], [place].    Subjective:     Patient ID: [name] , female    DOB: 05/17/1935 , 88 y.o.   MRN: [id number]   Chief Complaint  Patient presents with   Diabetes    HPI  Patient presents today for DM follow up. Patient has no other complaints or concerns. She is starting back to walking with the weather warming up. Continue f/u with Cardiology.      Diabetes She presents for her follow-up diabetic visit. She has type 2 diabetes mellitus. Her disease course has been worsening. There are no hypoglycemic associated symptoms. Pertinent negatives for hypoglycemia include no headaches. Pertinent negatives for diabetes include no chest pain, no fatigue, no polydipsia, no polyphagia and no polyuria. There are no hypoglycemic complications. Symptoms are improving. There are no diabetic complications. Risk factors for coronary artery disease include diabetes mellitus. Current diabetic treatment includes oral agent (dual therapy). She is compliant with treatment all of the time. She is following a generally healthy diet. When asked about meal planning, she reported none. She participates in exercise daily. There is no change in her home blood glucose trend. (Continues with Jardiance which was increased at last visit) She sees a podiatrist Susa Griffins).Eye exam is current (will request records from Dr. Venetia Maxon).  Hypertension This is a chronic problem. The current episode started more than 1 year ago. The problem has been gradually improving since onset. The problem is controlled. Pertinent negatives include no anxiety, chest pain, headaches, palpitations or shortness of breath. There are no associated agents to hypertension. Risk factors for coronary artery disease include diabetes mellitus and  dyslipidemia. Past treatments include angiotensin blockers, calcium channel blockers and beta blockers. There are no compliance problems.  Hypertensive end-organ damage includes kidney disease and CAD/MI. There is no history of angina. Identifiable causes of hypertension include chronic renal disease and a thyroid problem.     Past Medical History:  Diagnosis Date   Arthritis    Asthma    CAD (coronary artery disease)    s/p NSTEMI 10/19 - LHC:  pRCA 99, oLCx 30 >> DES to RCA; Echo 10/19: Mild focal basal septal hypertrophy, EF 60-65, inf-lat, inf and inf-sept HK, trivial MR, trivial TR   Diabetes mellitus without complication (HCC)    DKA (diabetic ketoacidoses) 08/31/2019   Hypertension      Family History  Problem Relation Age of Onset   Diabetes Mother    Heart disease Mother    Diabetes Father    Diabetes Sister    Heart disease Sister    Diabetes Brother      Current Outpatient Medications:    Accu-Chek FastClix Lancets MISC, USE AS DIRECTED FOR DAILY TESTING, Disp: 102 each, Rfl: 0   ACCU-CHEK GUIDE test strip, USE AS DIRECTED TO  CHECK  BLOOD  SUGAR  DAILY, Disp: 100 each, Rfl: 0   acetaminophen (TYLENOL) 325 MG tablet, Take by mouth every 6 (six) hours as needed for mild pain., Disp: , Rfl:    amLODipine-olmesartan (AZOR) 5-20 MG tablet, TAKE 1 TABLET BY MOUTH ONCE DAILY IN THE MORNING, Disp: 90 tablet, Rfl: 0   ANORO ELLIPTA 62.5-25 MCG/ACT AEPB, Inhale 1 puff by mouth once daily, Disp: 60 each, Rfl: 0   aspirin EC 81 MG tablet, Take 1 tablet (  81 mg total) by mouth daily. Swallow whole., Disp: 90 tablet, Rfl: 3   Cholecalciferol (VITAMIN D3) 10 MCG (400 UNIT) tablet, Take 400 Units by mouth daily. Per patient taking 2000 iu, Disp: , Rfl:    empagliflozin (JARDIANCE) 25 MG TABS tablet, Take 1 tablet (25 mg total) by mouth daily before breakfast., Disp: 90 tablet, Rfl: 1   Ergocalciferol (VITAMIN D2 PO), , Disp: , Rfl:    Insulin Pen Needle (PEN NEEDLES 29GX1/2") 29G X 12MM  MISC, , Disp: , Rfl:    LANTUS SOLOSTAR 100 UNIT/ML Solostar Pen, INJECT 32 UNITS SUBCUTANEOUSLY ONCE DAILY, Disp: 15 mL, Rfl: 0   levothyroxine (SYNTHROID) 50 MCG tablet, Take 1 tablet by mouth once daily, Disp: 90 tablet, Rfl: 0   meclizine (ANTIVERT) 25 MG tablet, TAKE 1 TABLET BY MOUTH ONCE DAILY AS NEEDED FOR DIZZINESS, Disp: 30 tablet, Rfl: 0   metoprolol succinate (TOPROL-XL) 25 MG 24 hr tablet, Take 1 tablet by mouth once daily, Disp: 90 tablet, Rfl: 3   NAPROXEN PO, , Disp: , Rfl:    nitroGLYCERIN (NITROSTAT) 0.4 MG SL tablet, Place 1 tablet (0.4 mg total) under the tongue every 5 (five) minutes x 3 doses as needed for chest pain., Disp: 25 tablet, Rfl: 7   RELION PEN NEEDLE 31G/8MM 31G X 8 MM MISC, USE AS DIRECTED, Disp: 100 each, Rfl: 0   rosuvastatin (CRESTOR) 20 MG tablet, Take 1 tablet by mouth once daily, Disp: 90 tablet, Rfl: 3   TRULICITY 4.5 0000000 SOPN, INJECT 4.5 MG INTO THE SKIN AS DIRECTED ONCE A WEEK, Disp: 12 mL, Rfl: 0   XIIDRA 5 % SOLN, Place 2 drops into both eyes 2 (two) times daily. , Disp: , Rfl:    No Known Allergies   Review of Systems  Constitutional:  Negative for fatigue.  Respiratory:  Negative for shortness of breath.   Cardiovascular:  Negative for chest pain and palpitations.  Endocrine: Negative for polydipsia, polyphagia and polyuria.  Neurological:  Negative for headaches.     Today's Vitals   11/02/22 0826  BP: 124/78  Pulse: 83  Temp: 98 F (36.7 C)  TempSrc: Oral  SpO2: 95%  Weight: 149 lb (67.6 kg)  Height: '5\' 2"'$  (1.575 m)   Body mass index is 27.25 kg/m.   Objective:  Physical Exam Vitals reviewed.  Constitutional:      General: She is not in acute distress.    Appearance: Normal appearance. She is well-developed.  HENT:     Head: Normocephalic and atraumatic.     Right Ear: Hearing normal.     Left Ear: Hearing normal.  Eyes:     General: Lids are normal.     Extraocular Movements: Extraocular movements intact.      Pupils: Pupils are equal, round, and reactive to light.     Funduscopic exam:    Right eye: No papilledema.        Left eye: No papilledema.  Neck:     Thyroid: No thyroid mass.  Cardiovascular:     Rate and Rhythm: Normal rate and regular rhythm.     Pulses: Normal pulses.     Heart sounds: Normal heart sounds. No murmur heard. Pulmonary:     Effort: Pulmonary effort is normal. No respiratory distress.     Breath sounds: Normal breath sounds. No wheezing.  Chest:     Chest wall: No mass.  Breasts:    Right: Normal. No mass or tenderness.  Left: Normal. No mass or tenderness.  Musculoskeletal:     Cervical back: Full passive range of motion without pain.     Right lower leg: No edema.     Left lower leg: No edema.  Lymphadenopathy:     Upper Body:     Right upper body: No supraclavicular, axillary or pectoral adenopathy.     Left upper body: No supraclavicular, axillary or pectoral adenopathy.  Skin:    General: Skin is warm and dry.     Capillary Refill: Capillary refill takes less than 2 seconds.  Neurological:     General: No focal deficit present.     Mental Status: She is alert and oriented to person, place, and time.     Cranial Nerves: No cranial nerve deficit.     Sensory: No sensory deficit.     Motor: No weakness.  Psychiatric:        Mood and Affect: Mood normal.        Behavior: Behavior normal.        Thought Content: Thought content normal.        Judgment: Judgment normal.         Assessment And Plan:     1. Type 2 diabetes mellitus with stage 3b chronic kidney disease, with long-term current use of insulin ([place]) Comments: HgbA1c improved at last visit, continue current medications and tolerating increase of Jardiance well - Hemoglobin A1c  2. Dyslipidemia Comments: Cholesterol levels are stable, continue statin, tolerating well. - Lipid panel  3. Vitamin D deficiency Comments: Vitamin d is normal, continue vitamin d supplement  4. Acquired  hypothyroidism Comments: Thyroid levels are normal, continue current medications  5. Hypertensive heart disease without heart failure Comments: Blood pressure is controlled today. Continue current medications and f/u with Cardiology - BMP8+eGFR  6. Coronary artery disease involving native coronary artery of native heart without angina pectoris Comments: Continue f/u with Cardiology  7. History of breast cancer  8. Overweight (BMI 25.0-29.9) She is encouraged to strive for BMI less than 25. Advised to aim for at least 150 minutes of exercise per week.    Patient was given opportunity to ask questions. Patient verbalized understanding of the plan and was able to repeat key elements of the plan. All questions were answered to their satisfaction.  [name], FNP   I, [name], FNP, have reviewed all documentation for this visit. The documentation on 11/02/22 for the exam, diagnosis, procedures, and orders are all accurate and complete.   IF YOU HAVE BEEN REFERRED TO A SPECIALIST, IT MAY TAKE 1-2 WEEKS TO SCHEDULE/PROCESS THE REFERRAL. IF YOU HAVE NOT HEARD FROM US/SPECIALIST IN TWO WEEKS, PLEASE GIVE [place] A CALL AT [phone] X [phone].   THE PATIENT IS ENCOURAGED TO PRACTICE SOCIAL DISTANCING DUE TO THE COVID-19 PANDEMIC.

## 2022-11-03 LAB — LIPID PANEL
Chol/HDL Ratio: 2.3 ratio (ref 0.0–4.4)
Cholesterol, Total: 122 mg/dL (ref 100–199)
HDL: 54 mg/dL (ref >39)
LDL Chol Calc (NIH): 50 mg/dL (ref 0–99)
Triglycerides: 97 mg/dL (ref 0–149)
VLDL Cholesterol Cal: 18 mg/dL (ref 5–40)

## 2022-11-03 LAB — BMP8+EGFR
BUN/Creatinine Ratio: 7 — ABNORMAL LOW (ref 12–28)
BUN: 9 mg/dL (ref 8–27)
CO2: 23 mmol/L (ref 20–29)
Calcium: 10 mg/dL (ref 8.7–10.3)
Chloride: 102 mmol/L (ref 96–106)
Creatinine, Ser: 1.32 mg/dL — ABNORMAL HIGH (ref 0.57–1.00)
Glucose: 150 mg/dL — ABNORMAL HIGH (ref 70–99)
Potassium: 4.5 mmol/L (ref 3.5–5.2)
Sodium: 141 mmol/L (ref 134–144)
eGFR: 40 mL/min/{1.73_m2} — ABNORMAL LOW (ref >59)

## 2022-11-03 LAB — HEMOGLOBIN A1C
Est. average glucose Bld gHb Est-mCnc: 180 mg/dL
Hgb A1c MFr Bld: 7.9 % — ABNORMAL HIGH (ref 4.8–5.6)

## 2022-11-12 ENCOUNTER — Other Ambulatory Visit: Payer: Self-pay | Admitting: Nurse Practitioner

## 2022-12-11 ENCOUNTER — Other Ambulatory Visit: Payer: Self-pay | Admitting: Nurse Practitioner

## 2022-12-14 ENCOUNTER — Other Ambulatory Visit: Payer: Self-pay | Admitting: Nurse Practitioner

## 2022-12-14 DIAGNOSIS — I129 Hypertensive chronic kidney disease with stage 1 through stage 4 chronic kidney disease, or unspecified chronic kidney disease: Secondary | ICD-10-CM | POA: Diagnosis not present

## 2022-12-14 DIAGNOSIS — E1122 Type 2 diabetes mellitus with diabetic chronic kidney disease: Secondary | ICD-10-CM | POA: Diagnosis not present

## 2022-12-14 DIAGNOSIS — D479 Neoplasm of uncertain behavior of lymphoid, hematopoietic and related tissue, unspecified: Secondary | ICD-10-CM | POA: Diagnosis not present

## 2022-12-14 DIAGNOSIS — N1831 Chronic kidney disease, stage 3a: Secondary | ICD-10-CM | POA: Diagnosis not present

## 2022-12-29 NOTE — Progress Notes (Unsigned)
Hershal Coria Martin,acting as a Neurosurgeon for Arnette Felts, FNP.,have documented all relevant documentation on the behalf of Arnette Felts, FNP,as directed by  Arnette Felts, FNP while in the presence of Arnette Felts, FNP.    Subjective:     Patient ID: [name] , female    DOB: 08-Dec-1934 , 88 y.o.   MRN: [id number]   No chief complaint on file.   HPI  Patient presents today for a BP check, patient reports compliance with medications and has no other concerns at this time.      Past Medical History:  Diagnosis Date   Arthritis    Asthma    CAD (coronary artery disease)    s/p NSTEMI 10/19 - LHC:  pRCA 99, oLCx 30 >> DES to RCA; Echo 10/19: Mild focal basal septal hypertrophy, EF 60-65, inf-lat, inf and inf-sept HK, trivial MR, trivial TR   Diabetes mellitus without complication (HCC)    DKA (diabetic ketoacidoses) 08/31/2019   Hypertension      Family History  Problem Relation Age of Onset   Diabetes Mother    Heart disease Mother    Diabetes Father    Diabetes Sister    Heart disease Sister    Diabetes Brother      Current Outpatient Medications:    Accu-Chek FastClix Lancets MISC, USE AS DIRECTED FOR DAILY TESTING, Disp: 102 each, Rfl: 0   ACCU-CHEK GUIDE test strip, USE AS DIRECTED TO  CHECK  BLOOD  SUGAR  DAILY, Disp: 100 each, Rfl: 0   acetaminophen (TYLENOL) 325 MG tablet, Take by mouth every 6 (six) hours as needed for mild pain., Disp: , Rfl:    amLODipine-olmesartan (AZOR) 5-20 MG tablet, TAKE 1 TABLET BY MOUTH ONCE DAILY IN THE MORNING, Disp: 90 tablet, Rfl: 0   ANORO ELLIPTA 62.5-25 MCG/ACT AEPB, Inhale 1 puff by mouth once daily, Disp: 60 each, Rfl: 0   aspirin EC 81 MG tablet, Take 1 tablet (81 mg total) by mouth daily. Swallow whole., Disp: 90 tablet, Rfl: 3   Cholecalciferol (VITAMIN D3) 10 MCG (400 UNIT) tablet, Take 400 Units by mouth daily. Per patient taking 2000 iu, Disp: , Rfl:    empagliflozin (JARDIANCE) 25 MG TABS tablet, Take 1 tablet (25 mg  total) by mouth daily before breakfast., Disp: 90 tablet, Rfl: 1   Ergocalciferol (VITAMIN D2 PO), , Disp: , Rfl:    Insulin Pen Needle (PEN NEEDLES 29GX1/2") 29G X MISC, , Disp: , Rfl:    LANTUS SOLOSTAR 100 UNIT/ML Solostar Pen, INJECT 32 UNITS SUBCUTANEOUSLY ONCE DAILY, Disp: 15 mL, Rfl: 0   levothyroxine (SYNTHROID) 50 MCG tablet, Take 1 tablet by mouth once daily, Disp: 90 tablet, Rfl: 0   meclizine (ANTIVERT) 25 MG tablet, TAKE 1 TABLET BY MOUTH ONCE DAILY AS NEEDED FOR DIZZINESS, Disp: 30 tablet, Rfl: 0   metoprolol succinate (TOPROL-XL) 25 MG 24 hr tablet, Take 1 tablet by mouth once daily, Disp: 90 tablet, Rfl: 3   NAPROXEN PO, , Disp: , Rfl:    nitroGLYCERIN (NITROSTAT) 0.4 MG SL tablet, Place 1 tablet (0.4 mg total) under the tongue every 5 (five) minutes x 3 doses as needed for chest pain., Disp: 25 tablet, Rfl: 7   RELION PEN NEEDLE 31G/8MM 31G X 8 MM MISC, USE AS DIRECTED, Disp: 100 each, Rfl: 0   rosuvastatin (CRESTOR) 20 MG tablet, Take 1 tablet by mouth once daily, Disp: 90 tablet, Rfl: 3   TRULICITY 4.5 MG/0.5ML SOPN, INJECT 4.5  MG INTO THE SKIN AS DIRECTED ONCE A WEEK, Disp: 12 mL, Rfl: 0   XIIDRA 5 % SOLN, Place 2 drops into both eyes 2 (two) times daily. , Disp: , Rfl:    No Known Allergies   Review of Systems   There were no vitals filed for this visit. There is no height or weight on file to calculate BMI.   Objective:  Physical Exam      Assessment And Plan:     1. Type 2 diabetes mellitus with stage 3b chronic kidney disease, with long-term current use of insulin (HCC)  2. Acquired hypothyroidism  3. Benign hypertension with CKD (chronic kidney disease) stage III [facility])     Patient was given opportunity to ask questions. Patient verbalized understanding of the plan and was able to repeat key elements of the plan. All questions were answered to their satisfaction.  Marlyn Corporal, CMA   I, Marlyn Corporal, CMA, have reviewed all documentation for this  visit. The documentation on 12/29/22 for the exam, diagnosis, procedures, and orders are all accurate and complete.   IF YOU HAVE BEEN REFERRED TO A SPECIALIST, IT MAY TAKE 1-2 WEEKS TO SCHEDULE/PROCESS THE REFERRAL. IF YOU HAVE NOT HEARD FROM US/SPECIALIST IN TWO WEEKS, PLEASE GIVE [place] A CALL AT [phone].   THE PATIENT IS ENCOURAGED TO PRACTICE SOCIAL DISTANCING DUE TO THE COVID-19 PANDEMIC.

## 2022-12-30 ENCOUNTER — Ambulatory Visit (INDEPENDENT_AMBULATORY_CARE_PROVIDER_SITE_OTHER): Payer: 59 | Admitting: Nurse Practitioner

## 2022-12-30 ENCOUNTER — Encounter: Payer: Self-pay | Admitting: Nurse Practitioner

## 2022-12-30 VITALS — BP 124/80 | HR 79 | Temp 98.6°F | Ht 63.0 in | Wt 144.0 lb

## 2022-12-30 DIAGNOSIS — N1832 Chronic kidney disease, stage 3b: Secondary | ICD-10-CM | POA: Diagnosis not present

## 2022-12-30 DIAGNOSIS — Z6825 Body mass index (BMI) 25.0-25.9, adult: Secondary | ICD-10-CM

## 2022-12-30 DIAGNOSIS — Z794 Long term (current) use of insulin: Secondary | ICD-10-CM

## 2022-12-30 DIAGNOSIS — I129 Hypertensive chronic kidney disease with stage 1 through stage 4 chronic kidney disease, or unspecified chronic kidney disease: Secondary | ICD-10-CM

## 2022-12-30 DIAGNOSIS — E1122 Type 2 diabetes mellitus with diabetic chronic kidney disease: Secondary | ICD-10-CM

## 2022-12-30 DIAGNOSIS — E039 Hypothyroidism, unspecified: Secondary | ICD-10-CM | POA: Diagnosis not present

## 2022-12-30 DIAGNOSIS — N183 Chronic kidney disease, stage 3 unspecified: Secondary | ICD-10-CM

## 2022-12-30 MED ORDER — EMPAGLIFLOZIN 25 MG PO TABS: 25.0000 mg | ORAL_TABLET | Freq: Every day | ORAL | 1 refills | Status: DC

## 2022-12-30 NOTE — Patient Instructions (Signed)
Hypertension, Adult ?Hypertension is another name for high blood pressure. High blood pressure forces your heart to work harder to pump blood. This can cause problems over time. ?There are two numbers in a blood pressure reading. There is a top number (systolic) over a bottom number (diastolic). It is best to have a blood pressure that is below 120/80. ?What are the causes? ?The cause of this condition is not known. Some other conditions can lead to high blood pressure. ?What increases the risk? ?Some lifestyle factors can make you more likely to develop high blood pressure: ?Smoking. ?Not getting enough exercise or physical activity. ?Being overweight. ?Having too much fat, sugar, calories, or salt (sodium) in your diet. ?Drinking too much alcohol. ?Other risk factors include: ?Having any of these conditions: ?Heart disease. ?Diabetes. ?High cholesterol. ?Kidney disease. ?Obstructive sleep apnea. ?Having a family history of high blood pressure and high cholesterol. ?Age. The risk increases with age. ?Stress. ?What are the signs or symptoms? ?High blood pressure may not cause symptoms. Very high blood pressure (hypertensive crisis) may cause: ?Headache. ?Fast or uneven heartbeats (palpitations). ?Shortness of breath. ?Nosebleed. ?Vomiting or feeling like you may vomit (nauseous). ?Changes in how you see. ?Very bad chest pain. ?Feeling dizzy. ?Seizures. ?How is this treated? ?This condition is treated by making healthy lifestyle changes, such as: ?Eating healthy foods. ?Exercising more. ?Drinking less alcohol. ?Your doctor may prescribe medicine if lifestyle changes do not help enough and if: ?Your top number is above 130. ?Your bottom number is above 80. ?Your personal target blood pressure may vary. ?Follow these instructions at home: ?Eating and drinking ? ?If told, follow the DASH eating plan. To follow this plan: ?Fill one half of your plate at each meal with fruits and vegetables. ?Fill one fourth of your plate  at each meal with whole grains. Whole grains include whole-wheat pasta, brown rice, and whole-grain bread. ?Eat or drink low-fat dairy products, such as skim milk or low-fat yogurt. ?Fill one fourth of your plate at each meal with low-fat (lean) proteins. Low-fat proteins include fish, chicken without skin, eggs, beans, and tofu. ?Avoid fatty meat, cured and processed meat, or chicken with skin. ?Avoid pre-made or processed food. ?Limit the amount of salt in your diet to less than 1,500 mg each day. ?Do not drink alcohol if: ?Your doctor tells you not to drink. ?You are pregnant, may be pregnant, or are planning to become pregnant. ?If you drink alcohol: ?Limit how much you have to: ?0-1 drink a day for women. ?0-2 drinks a day for men. ?Know how much alcohol is in your drink. In the [place] one drink equals one 12 oz bottle of beer (355 mL), one 5 oz glass of wine (148 mL), or one 1? oz glass of hard liquor (44 mL). ?Lifestyle ? ?Work with your doctor to stay at a healthy weight or to lose weight. Ask your doctor what the best weight is for you. ?Get at least 30 minutes of exercise that causes your heart to beat faster (aerobic exercise) most days of the week. This may include walking, swimming, or biking. ?Get at least 30 minutes of exercise that strengthens your muscles (resistance exercise) at least 3 days a week. This may include lifting weights or doing Pilates. ?Do not smoke or use any products that contain nicotine or tobacco. If you need help quitting, ask your doctor. ?Check your blood pressure at home as told by your doctor. ?Keep all follow-up visits. ?Medicines ?Take over-the-counter and prescription medicines   only as told by your doctor. Follow directions carefully. ?Do not skip doses of blood pressure medicine. The medicine does not work as well if you skip doses. Skipping doses also puts you at risk for problems. ?Ask your doctor about side effects or reactions to medicines that you should watch  for. ?Contact a doctor if: ?You think you are having a reaction to the medicine you are taking. ?You have headaches that keep coming back. ?You feel dizzy. ?You have swelling in your ankles. ?You have trouble with your vision. ?Get help right away if: ?You get a very bad headache. ?You start to feel mixed up (confused). ?You feel weak or numb. ?You feel faint. ?You have very bad pain in your: ?Chest. ?Belly (abdomen). ?You vomit more than once. ?You have trouble breathing. ?These symptoms may be an emergency. Get help right away. Call 911. ?Do not wait to see if the symptoms will go away. ?Do not drive yourself to the hospital. ?Summary ?Hypertension is another name for high blood pressure. ?High blood pressure forces your heart to work harder to pump blood. ?For most people, a normal blood pressure is less than 120/80. ?Making healthy choices can help lower blood pressure. If your blood pressure does not get lower with healthy choices, you may need to take medicine. ?This information is not intended to replace advice given to you by your health care provider. Make sure you discuss any questions you have with your health care provider. ?Document Revised: 05/29/2021 Document Reviewed: 05/29/2021 ?Elsevier Patient Education ? 2023 [employer] ? ?

## 2023-01-05 ENCOUNTER — Other Ambulatory Visit: Payer: Self-pay | Admitting: Nurse Practitioner

## 2023-01-09 ENCOUNTER — Other Ambulatory Visit: Payer: Self-pay | Admitting: Nurse Practitioner

## 2023-01-10 ENCOUNTER — Other Ambulatory Visit: Payer: Self-pay | Admitting: Nurse Practitioner

## 2023-01-12 ENCOUNTER — Other Ambulatory Visit: Payer: Self-pay | Admitting: Nurse Practitioner

## 2023-01-13 ENCOUNTER — Other Ambulatory Visit: Payer: Self-pay | Admitting: Nurse Practitioner

## 2023-01-28 ENCOUNTER — Other Ambulatory Visit: Payer: Self-pay

## 2023-01-28 DIAGNOSIS — N1832 Chronic kidney disease, stage 3b: Secondary | ICD-10-CM

## 2023-01-28 MED ORDER — MOUNJARO 2.5 MG/0.5ML ~~LOC~~ SOAJ: 2.5000 mg | SUBCUTANEOUS | 2 refills | Status: DC

## 2023-01-29 ENCOUNTER — Other Ambulatory Visit: Payer: Self-pay | Admitting: Nurse Practitioner

## 2023-02-03 ENCOUNTER — Encounter: Payer: Self-pay | Admitting: Podiatry

## 2023-02-03 ENCOUNTER — Ambulatory Visit (INDEPENDENT_AMBULATORY_CARE_PROVIDER_SITE_OTHER): Payer: 59 | Admitting: Podiatry

## 2023-02-03 VITALS — BP 107/65 | HR 69

## 2023-02-03 DIAGNOSIS — B351 Tinea unguium: Secondary | ICD-10-CM | POA: Diagnosis not present

## 2023-02-03 DIAGNOSIS — M79675 Pain in left toe(s): Secondary | ICD-10-CM | POA: Diagnosis not present

## 2023-02-03 DIAGNOSIS — M79674 Pain in right toe(s): Secondary | ICD-10-CM

## 2023-02-03 DIAGNOSIS — E1142 Type 2 diabetes mellitus with diabetic polyneuropathy: Secondary | ICD-10-CM

## 2023-02-03 NOTE — Progress Notes (Signed)
  Subjective:  Patient ID: [name], female    DOB: 1935-02-25,  MRN: [id number]  [name] presents to clinic today for at risk foot care with history of diabetic neuropathy and painful elongated mycotic toenails 1-5 bilaterally which are tender when wearing enclosed shoe gear. Pain is relieved with periodic professional debridement.  Chief Complaint  Patient presents with   Diabetes    [facility] BS - 103 A1C - DK  [facility] - 12/2022   New problem(s): None.   PCP is Madeline Felts, FNP.  No Known Allergies  Review of Systems: Negative except as noted in the HPI.  Objective: No changes noted in today's physical examination. Vitals:   02/03/23 0844  BP: 107/65  Pulse: 69  SpO2: 96%   [name] is a pleasant 88 y.o. female WD, WN in NAD. AAO x 3.  Vascular Examination: Capillary refill time immediate b/l. Vascular status intact b/l with palpable pedal pulses. Pedal hair present b/l. No edema. No pain with calf compression b/l. Skin temperature gradient WNL b/l. No cyanosis or clubbing noted b/l LE.  Neurological Examination: Sensation grossly intact b/l with 10 gram monofilament. Vibratory sensation intact b/l. Pt has subjective symptoms of neuropathy.  Dermatological Examination: Pedal skin with normal turgor, texture and tone b/l.  No open wounds. No interdigital macerations.   Toenails 1-5 b/l thick, discolored, elongated with subungual debris and pain on dorsal palpation.   No hyperkeratotic nor porokeratotic lesions present on today's visit.  Musculoskeletal Examination: Normal muscle strength 5/5 to all lower extremity muscle groups bilaterally. HAV with bunion bilaterally and hammertoes 2-5 b/l. Pes planus deformity noted bilateral LE.Marland Kitchen No pain, crepitus or joint limitation noted with ROM b/l LE.  Patient ambulates independently without assistive aids.  Radiographs: None  Assessment/Plan: 1. Pain due to onychomycosis of toenails of both feet    2. Diabetic peripheral neuropathy associated with type 2 diabetes mellitus (HCC)   -Consent given for treatment as described below: -Examined patient. -Mycotic toenails 1-5 bilaterally were debrided in length and girth with sterile nail nippers and dremel without incident. -Patient/POA to call should there be question/concern in the interim.   Return in about 3 months (around 05/06/2023).  Freddie Breech, DPM

## 2023-03-06 ENCOUNTER — Other Ambulatory Visit: Payer: Self-pay | Admitting: Nurse Practitioner

## 2023-03-10 ENCOUNTER — Other Ambulatory Visit: Payer: Self-pay | Admitting: Nurse Practitioner

## 2023-03-10 DIAGNOSIS — E118 Type 2 diabetes mellitus with unspecified complications: Secondary | ICD-10-CM

## 2023-03-12 ENCOUNTER — Other Ambulatory Visit: Payer: Self-pay | Admitting: Nurse Practitioner

## 2023-03-15 ENCOUNTER — Other Ambulatory Visit: Payer: Self-pay | Admitting: Nurse Practitioner

## 2023-04-01 ENCOUNTER — Other Ambulatory Visit: Payer: Self-pay | Admitting: Nurse Practitioner

## 2023-04-03 ENCOUNTER — Other Ambulatory Visit: Payer: Self-pay | Admitting: Nurse Practitioner

## 2023-04-06 ENCOUNTER — Ambulatory Visit (INDEPENDENT_AMBULATORY_CARE_PROVIDER_SITE_OTHER): Payer: 59 | Admitting: Nurse Practitioner

## 2023-04-06 ENCOUNTER — Other Ambulatory Visit: Payer: Self-pay | Admitting: Nurse Practitioner

## 2023-04-06 ENCOUNTER — Encounter: Payer: Self-pay | Admitting: Nurse Practitioner

## 2023-04-06 VITALS — BP 110/70 | HR 75 | Temp 97.9°F | Ht 63.0 in | Wt 144.6 lb

## 2023-04-06 DIAGNOSIS — I129 Hypertensive chronic kidney disease with stage 1 through stage 4 chronic kidney disease, or unspecified chronic kidney disease: Secondary | ICD-10-CM | POA: Diagnosis not present

## 2023-04-06 DIAGNOSIS — N1832 Chronic kidney disease, stage 3b: Secondary | ICD-10-CM | POA: Diagnosis not present

## 2023-04-06 DIAGNOSIS — E785 Hyperlipidemia, unspecified: Secondary | ICD-10-CM | POA: Diagnosis not present

## 2023-04-06 DIAGNOSIS — E1122 Type 2 diabetes mellitus with diabetic chronic kidney disease: Secondary | ICD-10-CM | POA: Diagnosis not present

## 2023-04-06 DIAGNOSIS — Z6825 Body mass index (BMI) 25.0-25.9, adult: Secondary | ICD-10-CM

## 2023-04-06 DIAGNOSIS — N183 Chronic kidney disease, stage 3 unspecified: Secondary | ICD-10-CM | POA: Diagnosis not present

## 2023-04-06 DIAGNOSIS — Z794 Long term (current) use of insulin: Secondary | ICD-10-CM | POA: Diagnosis not present

## 2023-04-06 DIAGNOSIS — E039 Hypothyroidism, unspecified: Secondary | ICD-10-CM

## 2023-04-06 MED ORDER — LEVOTHYROXINE SODIUM 50 MCG PO TABS: 50.0000 ug | ORAL_TABLET | Freq: Every day | ORAL | 1 refills | Status: DC

## 2023-04-06 NOTE — Assessment & Plan Note (Signed)
Chronic, Blood pressure is well controlled, continue current medications. Will check eGFR for kidney function

## 2023-04-06 NOTE — Progress Notes (Signed)
Madelaine Bhat, CMA,acting as a Neurosurgeon for Arnette Felts, FNP.,have documented all relevant documentation on the behalf of Arnette Felts, FNP,as directed by  Arnette Felts, FNP while in the presence of Arnette Felts, FNP.  Subjective:  Patient ID: [name] , female    DOB: 1935-05-10 , 88 y.o.   MRN: [id number]  Chief Complaint  Patient presents with   Diabetes    HPI  Patient presents today for a DM and BP follow up, patient reports compliance with medications. Patient denies any chest pain, SOB, or headache. Patient has no other concerns today. She received her flu shot from [facility] last week  BP Readings from Last 3 Encounters: 04/06/23 : 110/70 02/03/23 : 107/65 12/30/22 : 124/80    Diabetes She presents for her follow-up diabetic visit. She has type 2 diabetes mellitus. Her disease course has been worsening. There are no hypoglycemic associated symptoms. Pertinent negatives for hypoglycemia include no headaches. Pertinent negatives for diabetes include no chest pain, no fatigue, no polydipsia, no polyphagia and no polyuria. There are no hypoglycemic complications. Symptoms are improving. Diabetic complications include nephropathy. Risk factors for coronary artery disease include diabetes mellitus. Current diabetic treatment includes oral agent (triple therapy). She is compliant with treatment all of the time. She is following a generally healthy diet. When asked about meal planning, she reported none. She participates in exercise daily. There is no change in her home blood glucose trend. (Continues with Jardiance, Trulicity and lantus, doing well. Blood sugar was 102 this am) She sees a podiatrist ([facility]).Eye exam is current.  Hypertension This is a chronic problem. The current episode started more than 1 year ago. The problem has been gradually improving since onset. The problem is controlled. Pertinent negatives include no anxiety, chest pain, headaches,  palpitations or shortness of breath. There are no associated agents to hypertension. Risk factors for coronary artery disease include diabetes mellitus and dyslipidemia. Past treatments include angiotensin blockers, calcium channel blockers and beta blockers. There are no compliance problems.  Hypertensive end-organ damage includes kidney disease and CAD/MI. There is no history of angina. Identifiable causes of hypertension include chronic renal disease and a thyroid problem.     Past Medical History:  Diagnosis Date   Arthritis    Asthma    CAD (coronary artery disease)    s/p NSTEMI 10/19 - LHC:  pRCA 99, oLCx 30 >> DES to RCA; Echo 10/19: Mild focal basal septal hypertrophy, EF 60-65, inf-lat, inf and inf-sept HK, trivial MR, trivial TR   Diabetes mellitus without complication (HCC)    DKA (diabetic ketoacidoses) 08/31/2019   Hypertension      Family History  Problem Relation Age of Onset   Diabetes Mother    Heart disease Mother    Diabetes Father    Diabetes Sister    Heart disease Sister    Diabetes Brother      Current Outpatient Medications:    Accu-Chek FastClix Lancets MISC, USE AS DIRECTED FOR DAILY TESTING, Disp: 102 each, Rfl: 0   ACCU-CHEK GUIDE test strip, USE AS DIRECTED TO  CHECK  BLOOD  SUGAR  DAILY, Disp: 100 each, Rfl: 0   acetaminophen (TYLENOL) 325 MG tablet, Take by mouth every 6 (six) hours as needed for mild pain., Disp: , Rfl:    amLODipine-olmesartan (AZOR) 5-20 MG tablet, TAKE 1 TABLET BY MOUTH ONCE DAILY IN THE MORNING, Disp: 90 tablet, Rfl: 0   ANORO ELLIPTA 62.5-25 MCG/ACT AEPB, Inhale 1 puff  by mouth once daily, Disp: 60 each, Rfl: 0   aspirin EC 81 MG tablet, Take 1 tablet (81 mg total) by mouth daily. Swallow whole., Disp: 90 tablet, Rfl: 3   Cholecalciferol (VITAMIN D3) 10 MCG (400 UNIT) tablet, Take 400 Units by mouth daily. Per patient taking 2000 iu, Disp: , Rfl:    empagliflozin (JARDIANCE) 25 MG TABS tablet, Take 1 tablet (25 mg total) by mouth  daily before breakfast., Disp: 90 tablet, Rfl: 1   Ergocalciferol (VITAMIN D2 PO), , Disp: , Rfl:    Insulin Pen Needle (PEN NEEDLES 29GX1/2") 29G X MISC, , Disp: , Rfl:    LANTUS SOLOSTAR 100 UNIT/ML Solostar Pen, INJECT 30 UNITS SUBCUTANEOUSLY ONCE DAILY, Disp: 15 mL, Rfl: 0   meclizine (ANTIVERT) 25 MG tablet, TAKE 1 TABLET BY MOUTH ONCE DAILY AS NEEDED FOR DIZZINESS, Disp: 30 tablet, Rfl: 0   metoprolol succinate (TOPROL-XL) 25 MG 24 hr tablet, Take 1 tablet by mouth once daily, Disp: 90 tablet, Rfl: 3   NAPROXEN PO, , Disp: , Rfl:    nitroGLYCERIN (NITROSTAT) 0.4 MG SL tablet, Place 1 tablet (0.4 mg total) under the tongue every 5 (five) minutes x 3 doses as needed for chest pain., Disp: 25 tablet, Rfl: 7   RELION PEN NEEDLE 31G/8MM 31G X 8 MM MISC, USE AS DIRECTED, Disp: 100 each, Rfl: 0   rosuvastatin (CRESTOR) 20 MG tablet, Take 1 tablet by mouth once daily, Disp: 90 tablet, Rfl: 3   TRULICITY 4.5 MG/0.5ML SOPN, INJECT 4.5 MG INTO THE SKIN AS DIRECTED ONCE A WEEK, Disp: 12 mL, Rfl: 0   XIIDRA 5 % SOLN, Place 2 drops into both eyes 2 (two) times daily. , Disp: , Rfl:    levothyroxine (SYNTHROID) 50 MCG tablet, Take 1 tablet (50 mcg total) by mouth daily., Disp: 90 tablet, Rfl: 1   No Known Allergies   Review of Systems  Constitutional: Negative.  Negative for fatigue.  HENT: Negative.    Eyes: Negative.   Respiratory: Negative.  Negative for shortness of breath.   Cardiovascular: Negative.  Negative for chest pain and palpitations.  Gastrointestinal: Negative.   Endocrine: Negative for polydipsia, polyphagia and polyuria.  Neurological: Negative.  Negative for headaches.  Psychiatric/Behavioral: Negative.       Today's Vitals   04/06/23 0826  BP: 110/70  Pulse: 75  Temp: 97.9 F (36.6 C)  TempSrc: Oral  Weight: 144 lb 9.6 oz (65.6 kg)  Height: 5\' 3"  (1.6 m)  PainSc: 0-No pain   Body mass index is 25.61 kg/m.  Wt Readings from Last 3 Encounters:  04/06/23 144 lb  9.6 oz (65.6 kg)  12/30/22 144 lb (65.3 kg)  11/02/22 149 lb (67.6 kg)     Objective:  Physical Exam Vitals reviewed.  Constitutional:      General: She is not in acute distress.    Appearance: Normal appearance. She is well-developed.  HENT:     Head: Normocephalic and atraumatic.     Right Ear: Hearing normal.     Left Ear: Hearing normal.  Eyes:     General: Lids are normal.     Extraocular Movements: Extraocular movements intact.     Pupils: Pupils are equal, round, and reactive to light.     Funduscopic exam:    Right eye: No papilledema.        Left eye: No papilledema.  Neck:     Thyroid: No thyroid mass.  Cardiovascular:     Rate  and Rhythm: Normal rate and regular rhythm.     Pulses: Normal pulses.     Heart sounds: Normal heart sounds. No murmur heard. Pulmonary:     Effort: Pulmonary effort is normal. No respiratory distress.     Breath sounds: Normal breath sounds. No wheezing.  Chest:     Chest wall: No mass.  Breasts:    Right: Normal. No mass or tenderness.     Left: Normal. No mass or tenderness.  Musculoskeletal:     Cervical back: Full passive range of motion without pain.     Right lower leg: No edema.     Left lower leg: No edema.  Lymphadenopathy:     Upper Body:     Right upper body: No supraclavicular, axillary or pectoral adenopathy.     Left upper body: No supraclavicular, axillary or pectoral adenopathy.  Skin:    General: Skin is warm and dry.     Capillary Refill: Capillary refill takes less than 2 seconds.  Neurological:     General: No focal deficit present.     Mental Status: She is alert and oriented to person, place, and time.     Cranial Nerves: No cranial nerve deficit.     Sensory: No sensory deficit.     Motor: No weakness.  Psychiatric:        Mood and Affect: Mood normal.        Behavior: Behavior normal.        Thought Content: Thought content normal.        Judgment: Judgment normal.         Assessment And Plan:   Type 2 diabetes mellitus with stage 3b chronic kidney disease, with long-term current use of insulin (HCC) Assessment & Plan: Chronic, HgbA1c is improving was 7.9 at last visit. Continue Trulicity, Lantus and Jardiance. Discontinued Mounjaro.   Orders: -     Basic metabolic panel -     Hemoglobin A1c  Benign hypertension with CKD (chronic kidney disease) stage III (HCC) Assessment & Plan: Chronic, Blood pressure is well controlled, continue current medications. Will check eGFR for kidney function  Orders: -     Basic metabolic panel  Acquired hypothyroidism Assessment & Plan: Chronic, continue current medications. Will make changes to medications pending results.   Orders: -     TSH -     T3, free  Dyslipidemia Assessment & Plan: Chronic, stable. Continue statin, tolerating well.   Orders: -     Lipid panel  BMI 25.0-25.9,adult  Other orders -     Levothyroxine Sodium; Take 1 tablet (50 mcg total) by mouth daily.  Dispense: 90 tablet; Refill: 1    Return for cancel appt with me on 11/13, she would like to do virtual AWV and keep physical with me on 11/19.   Patient was given opportunity to ask questions. Patient verbalized understanding of the plan and was able to repeat key elements of the plan. All questions were answered to their satisfaction.    Jeanell Sparrow, FNP, have reviewed all documentation for this visit. The documentation on 04/06/23 for the exam, diagnosis, procedures, and orders are all accurate and complete.   IF YOU HAVE BEEN REFERRED TO A SPECIALIST, IT MAY TAKE 1-2 WEEKS TO SCHEDULE/PROCESS THE REFERRAL. IF YOU HAVE NOT HEARD FROM US/SPECIALIST IN TWO WEEKS, PLEASE GIVE [place] A CALL AT [phone].

## 2023-04-06 NOTE — Assessment & Plan Note (Addendum)
Chronic, continue current medications. Will make changes to medications pending results.

## 2023-04-06 NOTE — Assessment & Plan Note (Signed)
Chronic, HgbA1c is improving was 7.9 at last visit. Continue Trulicity, Lantus and Jardiance. Discontinued Mounjaro.

## 2023-04-06 NOTE — Assessment & Plan Note (Signed)
Chronic, stable. Continue statin, tolerating well.

## 2023-04-12 ENCOUNTER — Other Ambulatory Visit: Payer: Self-pay | Admitting: Nurse Practitioner

## 2023-04-28 ENCOUNTER — Other Ambulatory Visit: Payer: Self-pay | Admitting: Nurse Practitioner

## 2023-04-28 DIAGNOSIS — Z1231 Encounter for screening mammogram for malignant neoplasm of breast: Secondary | ICD-10-CM

## 2023-04-29 ENCOUNTER — Other Ambulatory Visit: Payer: Self-pay | Admitting: Nurse Practitioner

## 2023-05-01 ENCOUNTER — Other Ambulatory Visit: Payer: Self-pay | Admitting: Nurse Practitioner

## 2023-05-02 ENCOUNTER — Other Ambulatory Visit: Payer: Self-pay | Admitting: Nurse Practitioner

## 2023-05-21 ENCOUNTER — Other Ambulatory Visit: Payer: Self-pay | Admitting: Nurse Practitioner

## 2023-05-25 ENCOUNTER — Encounter: Payer: Self-pay | Admitting: Podiatry

## 2023-05-25 ENCOUNTER — Ambulatory Visit (INDEPENDENT_AMBULATORY_CARE_PROVIDER_SITE_OTHER): Payer: 59 | Admitting: Podiatry

## 2023-05-25 DIAGNOSIS — B351 Tinea unguium: Secondary | ICD-10-CM

## 2023-05-25 DIAGNOSIS — E1142 Type 2 diabetes mellitus with diabetic polyneuropathy: Secondary | ICD-10-CM

## 2023-05-25 DIAGNOSIS — M79675 Pain in left toe(s): Secondary | ICD-10-CM | POA: Diagnosis not present

## 2023-05-25 DIAGNOSIS — M79674 Pain in right toe(s): Secondary | ICD-10-CM

## 2023-05-25 NOTE — Progress Notes (Signed)
Subjective:  Patient ID: [name], female    DOB: 08-01-1935,  MRN: [id number]  Chief Complaint  Patient presents with   Diabetes    BS-91 A1C-7 PCP-8/13    88 y.o. female presents with at risk foot care with history of diabetic neuropathy and painful thick toenails that are difficult to trim. Pain interferes with ambulation. Aggravating factors include wearing enclosed shoe gear. Pain is relieved with periodic professional debridement.  New problem(s): None    PCP: Arnette Felts, FNP.  Review of Systems: Negative except as noted in the HPI.   No Known Allergies  Objective:  There were no vitals filed for this visit. Constitutional Patient is a pleasant 88 y.o. female WD, WN in NAD. AAO x 3.  Vascular Capillary fill time to digits immediate b/l.  DP/PT pulse(s) are palpable b/l lower extremities. Pedal hair sparse. Lower extremity skin temperature gradient within normal limits. No pain with calf compression b/l. No edema noted b/l lower extremities. No cyanosis or clubbing noted.   Neurologic Protective sensation intact 5/5 intact bilaterally with 10g monofilament b/l. Vibratory sensation intact b/l. No clonus b/l. Pt has subjective symptoms of neuropathy.  Dermatologic Pedal skin is warm and supple b/l.  No open wounds b/l lower extremities. No interdigital macerations b/l lower extremities. Toenails 1-5 b/l elongated, discolored, dystrophic, thickened, crumbly with subungual debris and tenderness to dorsal palpation. No corns, calluses nor porokeratotic lesions noted.  Orthopedic: Normal muscle strength 5/5 to all lower extremity muscle groups bilaterally. Patient ambulates independent of any assistive aids. HAV with bunion deformity noted b/l LE. Hammertoe deformity noted 2-5 b/l. Pes planus deformity noted bilateral LE.      Latest Ref Rng & Units 04/06/2023    9:17 AM 11/02/2022    8:44 AM 07/01/2022    9:45 AM  Hemoglobin A1C  Hemoglobin-A1c 4.8 - 5.6 % 8.2  7.9  8.4     Radiographs:  None  Assessment:   1. Pain due to onychomycosis of toenails of both feet   2. Diabetic peripheral neuropathy associated with type 2 diabetes mellitus (HCC)    Plan:  Patient was evaluated and treated and all questions answered. Consent given for treatment as described below: -Patient was evaluated and treated. All patient's and/or POA's questions/concerns answered on today's visit. -Continue foot and shoe inspections daily. Monitor blood glucose per PCP/Endocrinologist's recommendations. -Toenails 1-5 b/l were debrided in length and girth with sterile nail nippers and dremel without iatrogenic bleeding.  -Patient to report any pedal injuries to medical professional immediately. -Patient/POA to call should there be question/concern in the interim.  Return in about 3 months (around 08/25/2023).  Freddie Breech, DPM

## 2023-05-26 ENCOUNTER — Other Ambulatory Visit: Payer: Self-pay

## 2023-05-31 ENCOUNTER — Ambulatory Visit
Admission: RE | Admit: 2023-05-31 | Discharge: 2023-05-31 | Disposition: A | Discharge status: Home / Self Care | Payer: 59 | Source: Ambulatory Visit | Attending: Nurse Practitioner | Admitting: Nurse Practitioner

## 2023-05-31 ENCOUNTER — Other Ambulatory Visit: Payer: Self-pay | Admitting: Nurse Practitioner

## 2023-05-31 DIAGNOSIS — Z1231 Encounter for screening mammogram for malignant neoplasm of breast: Secondary | ICD-10-CM | POA: Diagnosis not present

## 2023-06-14 ENCOUNTER — Other Ambulatory Visit: Payer: Self-pay | Admitting: Nurse Practitioner

## 2023-06-15 DIAGNOSIS — H40023 Open angle with borderline findings, high risk, bilateral: Secondary | ICD-10-CM | POA: Diagnosis not present

## 2023-06-15 DIAGNOSIS — H2511 Age-related nuclear cataract, right eye: Secondary | ICD-10-CM | POA: Diagnosis not present

## 2023-06-15 DIAGNOSIS — H43813 Vitreous degeneration, bilateral: Secondary | ICD-10-CM | POA: Diagnosis not present

## 2023-06-15 DIAGNOSIS — E119 Type 2 diabetes mellitus without complications: Secondary | ICD-10-CM | POA: Diagnosis not present

## 2023-06-26 ENCOUNTER — Other Ambulatory Visit: Payer: Self-pay | Admitting: Nurse Practitioner

## 2023-06-29 ENCOUNTER — Other Ambulatory Visit: Payer: Self-pay | Admitting: Nurse Practitioner

## 2023-07-07 ENCOUNTER — Ambulatory Visit: Payer: Self-pay | Admitting: Nurse Practitioner

## 2023-07-12 NOTE — Progress Notes (Addendum)
Madelaine Bhat, CMA,acting as a Neurosurgeon for Arnette Felts, FNP.,have documented all relevant documentation on the behalf of Arnette Felts, FNP,as directed by  Arnette Felts, FNP while in the presence of Arnette Felts, FNP.  Subjective:    Patient ID: [name] , female    DOB: 1935/02/28 , 88 y.o.   MRN: [id number]  Chief Complaint  Patient presents with   Annual Exam    HPI  Patient presents today for HM, Patient reports compliance with medication. Patient denies any chest pain, SOB, or headaches. Patient has no concerns today. Patient reports she got both her covid and flu shot but forgot where.      Past Medical History:  Diagnosis Date   Arthritis    Asthma    CAD (coronary artery disease)    s/p NSTEMI 10/19 - LHC:  pRCA 99, oLCx 30 >> DES to RCA; Echo 10/19: Mild focal basal septal hypertrophy, EF 60-65, inf-lat, inf and inf-sept HK, trivial MR, trivial TR   Diabetes mellitus without complication (HCC)    DKA (diabetic ketoacidoses) 08/31/2019   Hypertension      Family History  Problem Relation Age of Onset   Diabetes Mother    Heart disease Mother    Diabetes Father    Diabetes Sister    Heart disease Sister    Diabetes Brother      Current Outpatient Medications:    acetaminophen (TYLENOL) 325 MG tablet, Take by mouth every 6 (six) hours as needed for mild pain., Disp: , Rfl:    amLODipine-olmesartan (AZOR) 5-20 MG tablet, TAKE 1 TABLET BY MOUTH ONCE DAILY IN THE MORNING, Disp: 90 tablet, Rfl: 0   aspirin EC 81 MG tablet, Take 1 tablet (81 mg total) by mouth daily. Swallow whole., Disp: 90 tablet, Rfl: 3   Cholecalciferol (VITAMIN D3) 10 MCG (400 UNIT) tablet, Take 400 Units by mouth daily. Per patient taking 2000 iu, Disp: , Rfl:    empagliflozin (JARDIANCE) 25 MG TABS tablet, Take 1 tablet (25 mg total) by mouth daily before breakfast., Disp: 90 tablet, Rfl: 1   Ergocalciferol (VITAMIN D2 PO), , Disp: , Rfl:    Insulin Pen Needle (PEN NEEDLES 29GX1/2") 29G X  MISC, , Disp: , Rfl:    LANTUS SOLOSTAR 100 UNIT/ML Solostar Pen, INJECT 32 UNITS SUBCUTANEOUSLY ONCE DAILY, Disp: 15 mL, Rfl: 0   levothyroxine (SYNTHROID) 50 MCG tablet, Take 1 tablet (50 mcg total) by mouth daily., Disp: 90 tablet, Rfl: 1   meclizine (ANTIVERT) 25 MG tablet, TAKE 1 TABLET BY MOUTH ONCE DAILY AS NEEDED FOR DIZZINESS, Disp: 30 tablet, Rfl: 0   metoprolol succinate (TOPROL-XL) 25 MG 24 hr tablet, Take 1 tablet by mouth once daily, Disp: 90 tablet, Rfl: 3   NAPROXEN PO, , Disp: , Rfl:    nitroGLYCERIN (NITROSTAT) 0.4 MG SL tablet, Place 1 tablet (0.4 mg total) under the tongue every 5 (five) minutes x 3 doses as needed for chest pain., Disp: 25 tablet, Rfl: 7   rosuvastatin (CRESTOR) 20 MG tablet, Take 1 tablet by mouth once daily, Disp: 90 tablet, Rfl: 3   TRULICITY 4.5 MG/0.5ML SOPN, INJECT 4.5 MG INTO THE SKIN AS DIRECTED ONCE A WEEK, Disp: 24 mL, Rfl: 0   XIIDRA 5 % SOLN, Place 2 drops into both eyes 2 (two) times daily. , Disp: , Rfl:    Accu-Chek FastClix Lancets MISC, USE AS DIRECTED FOR  DAILY  TESTING, Disp: 102 each, Rfl: 0   ACCU-CHEK  GUIDE TEST test strip, USE AS DIRECTED TO CHECK BLOOD SUGAR DAILY, Disp: 100 each, Rfl: 0   ANORO ELLIPTA 62.5-25 MCG/ACT AEPB, Inhale 1 puff by mouth once daily, Disp: 60 each, Rfl: 0   RELION PEN NEEDLE 31G/8MM 31G X 8 MM MISC, USE AS DIRECTED, Disp: 100 each, Rfl: 0   No Known Allergies    The patient states she uses status post hysterectomy for birth control. No LMP recorded. Patient has had a hysterectomy. Negative for: breast discharge, breast lump(s), breast pain and breast self exam. Associated symptoms include abnormal vaginal bleeding. Pertinent negatives include abnormal bleeding (hematology), anxiety, decreased libido, depression, difficulty falling sleep, dyspareunia, history of infertility, nocturia, sexual dysfunction, sleep disturbances, urinary incontinence, urinary urgency, vaginal discharge and vaginal itching. Diet  regular; low carb and tries to avoid sugars. The patient states her exercise level is moderate exercises daily - she does yard work.   The patient's tobacco use is:  Social History   Tobacco Use  Smoking Status Former   Current packs/day: 0.50   Average packs/day: 0.5 packs/day for 60.0 years (30.0 ttl pk-yrs)   Types: Cigarettes  Smokeless Tobacco Never   She has been exposed to passive smoke. The patient's alcohol use is:  Social History   Substance and Sexual Activity  Alcohol Use No    Review of Systems  Constitutional: Negative.   HENT: Negative.    Eyes: Negative.   Respiratory: Negative.    Cardiovascular: Negative.   Gastrointestinal: Negative.   Endocrine: Negative.   Genitourinary: Negative.   Musculoskeletal: Negative.   Skin: Negative.   Allergic/Immunologic: Negative.   Neurological: Negative.   Hematological: Negative.   Psychiatric/Behavioral: Negative.       Today's Vitals   07/13/23 0836  BP: 110/74  Pulse: 79  Temp: (!) 97.5 F (36.4 C)  TempSrc: Oral  Weight: 146 lb 9.6 oz (66.5 kg)  Height: 5\' 3"  (1.6 m)  PainSc: 0-No pain   Body mass index is 25.97 kg/m.  Wt Readings from Last 3 Encounters:  07/13/23 146 lb 9.6 oz (66.5 kg)  04/06/23 144 lb 9.6 oz (65.6 kg)  12/30/22 144 lb (65.3 kg)     Objective:  Physical Exam Vitals reviewed.  Constitutional:      General: She is not in acute distress.    Appearance: Normal appearance. She is well-developed.  HENT:     Head: Normocephalic and atraumatic.     Right Ear: Hearing normal.     Left Ear: Hearing normal.  Eyes:     General: Lids are normal.     Extraocular Movements: Extraocular movements intact.     Pupils: Pupils are equal, round, and reactive to light.     Funduscopic exam:    Right eye: No papilledema.        Left eye: No papilledema.  Neck:     Thyroid: No thyroid mass.  Cardiovascular:     Rate and Rhythm: Normal rate and regular rhythm.     Pulses: Normal pulses.      Heart sounds: Normal heart sounds. No murmur heard. Pulmonary:     Effort: Pulmonary effort is normal. No respiratory distress.     Breath sounds: Normal breath sounds. No wheezing.  Chest:     Chest wall: No mass.  Breasts:    Right: Normal. No mass or tenderness.     Left: Normal. No mass or tenderness.  Musculoskeletal:     Cervical back: Full passive range of motion without  pain.     Right lower leg: No edema.     Left lower leg: No edema.  Lymphadenopathy:     Upper Body:     Right upper body: No supraclavicular, axillary or pectoral adenopathy.     Left upper body: No supraclavicular, axillary or pectoral adenopathy.  Skin:    General: Skin is warm and dry.     Capillary Refill: Capillary refill takes less than 2 seconds.  Neurological:     General: No focal deficit present.     Mental Status: She is alert and oriented to person, place, and time.     Cranial Nerves: No cranial nerve deficit.     Sensory: No sensory deficit.     Motor: No weakness.  Psychiatric:        Mood and Affect: Mood normal.        Behavior: Behavior normal.        Thought Content: Thought content normal.        Judgment: Judgment normal.         Assessment And Plan:     Encounter for annual health examination Assessment & Plan: Behavior modifications discussed and diet history reviewed.   Pt will continue to exercise regularly and modify diet with low GI, plant based foods and decrease intake of processed foods.  Recommend intake of daily multivitamin, Vitamin D, and calcium.  Recommend mammogram for preventive screenings, as well as recommend immunizations that include influenza, TDAP, and Shingles    Type 2 diabetes mellitus with stage 3b chronic kidney disease, with long-term current use of insulin (HCC) Assessment & Plan: Chronic, HgbA1c is up to 8.2 at last visit. Continue Trulicity, Lantus and Jardiance. Tolerating medications well.    Orders: -     EKG 12-Lead -     POCT  URINALYSIS DIP (CLINITEK) -     Microalbumin / creatinine urine ratio -     CMP14+EGFR -     Lipid panel -     Hemoglobin A1c  Hypertensive nephropathy Assessment & Plan: Chronic, Blood pressure is well controlled, continue current medications. Will check eGFR for kidney function. EKG done with NSR HR 77  Orders: -     EKG 12-Lead -     POCT URINALYSIS DIP (CLINITEK) -     Microalbumin / creatinine urine ratio -     CMP14+EGFR  Acquired hypothyroidism Assessment & Plan: Chronic, continue current medications. Will make changes to medications pending results.   Orders: -     TSH + free T4  Excessive cerumen in both ear canals Assessment & Plan: Cleaned bilateral ears with lighted curette, can use sweet oil to bilateral ears for slightly firm cerumen left in canal   BMI 25.0-25.9,adult  Other long term (current) drug therapy -     CBC with Differential/Platelet   Return for she needs her AWV scheduled ASAP for this year; 4 month f/u DM. Patient was given opportunity to ask questions. Patient verbalized understanding of the plan and was able to repeat key elements of the plan. All questions were answered to their satisfaction.   Arnette Felts, FNP  I, Arnette Felts, FNP, have reviewed all documentation for this visit. The documentation on 07/28/23 for the exam, diagnosis, procedures, and orders are all accurate and complete.

## 2023-07-13 ENCOUNTER — Other Ambulatory Visit: Payer: Self-pay | Admitting: Nurse Practitioner

## 2023-07-13 ENCOUNTER — Encounter: Payer: Self-pay | Admitting: Nurse Practitioner

## 2023-07-13 ENCOUNTER — Ambulatory Visit (INDEPENDENT_AMBULATORY_CARE_PROVIDER_SITE_OTHER): Payer: 59 | Admitting: Nurse Practitioner

## 2023-07-13 VITALS — BP 110/74 | HR 79 | Temp 97.5°F | Ht 63.0 in | Wt 146.6 lb

## 2023-07-13 DIAGNOSIS — E1122 Type 2 diabetes mellitus with diabetic chronic kidney disease: Secondary | ICD-10-CM | POA: Diagnosis not present

## 2023-07-13 DIAGNOSIS — Z79899 Other long term (current) drug therapy: Secondary | ICD-10-CM

## 2023-07-13 DIAGNOSIS — Z0001 Encounter for general adult medical examination with abnormal findings: Secondary | ICD-10-CM | POA: Diagnosis not present

## 2023-07-13 DIAGNOSIS — Z794 Long term (current) use of insulin: Secondary | ICD-10-CM | POA: Diagnosis not present

## 2023-07-13 DIAGNOSIS — N183 Chronic kidney disease, stage 3 unspecified: Secondary | ICD-10-CM

## 2023-07-13 DIAGNOSIS — H6123 Impacted cerumen, bilateral: Secondary | ICD-10-CM | POA: Diagnosis not present

## 2023-07-13 DIAGNOSIS — N1832 Chronic kidney disease, stage 3b: Secondary | ICD-10-CM | POA: Diagnosis not present

## 2023-07-13 DIAGNOSIS — Z0000 Encounter for general adult medical examination without abnormal findings: Secondary | ICD-10-CM | POA: Insufficient documentation

## 2023-07-13 DIAGNOSIS — I129 Hypertensive chronic kidney disease with stage 1 through stage 4 chronic kidney disease, or unspecified chronic kidney disease: Secondary | ICD-10-CM

## 2023-07-13 DIAGNOSIS — E039 Hypothyroidism, unspecified: Secondary | ICD-10-CM | POA: Diagnosis not present

## 2023-07-13 DIAGNOSIS — Z6825 Body mass index (BMI) 25.0-25.9, adult: Secondary | ICD-10-CM

## 2023-07-13 LAB — POCT URINALYSIS DIP (CLINITEK)
Bilirubin, UA: NEGATIVE
Glucose, UA: 500 mg/dL — AB
Ketones, POC UA: NEGATIVE mg/dL
Nitrite, UA: NEGATIVE
POC PROTEIN,UA: NEGATIVE
Spec Grav, UA: 1.02 (ref 1.010–1.025)
Urobilinogen, UA: 0.2 U/dL
pH, UA: 5.5 (ref 5.0–8.0)

## 2023-07-13 NOTE — Assessment & Plan Note (Signed)
Chronic, HgbA1c is up to 8.2 at last visit. Continue Trulicity, Lantus and Jardiance. Tolerating medications well.

## 2023-07-13 NOTE — Patient Instructions (Signed)
Health Maintenance  Topic Date Due   Medicare Annual Wellness Visit  07/03/2023   COVID-19 Vaccine (7 - 2023-24 season) 07/29/2023*   Flu Shot  11/22/2023*   Hemoglobin A1C  10/07/2023   Eye exam for diabetics  10/14/2023   Complete foot exam   10/17/2023   DTaP/Tdap/Td vaccine (2 - Td or Tdap) 09/13/2030   Pneumonia Vaccine  Completed   DEXA scan (bone density measurement)  Completed   Zoster (Shingles) Vaccine  Completed   HPV Vaccine  Aged Out  *Topic was postponed. The date shown is not the original due date.

## 2023-07-13 NOTE — Assessment & Plan Note (Signed)
Behavior modifications discussed and diet history reviewed.   Pt will continue to exercise regularly and modify diet with low GI, plant based foods and decrease intake of processed foods.  Recommend intake of daily multivitamin, Vitamin D, and calcium.  Recommend mammogram for preventive screenings, as well as recommend immunizations that include influenza, TDAP, and Shingles

## 2023-07-13 NOTE — Assessment & Plan Note (Addendum)
Chronic, Blood pressure is well controlled, continue current medications. Will check eGFR for kidney function. EKG done with NSR HR 77

## 2023-07-13 NOTE — Assessment & Plan Note (Signed)
Cleaned bilateral ears with lighted curette, can use sweet oil to bilateral ears for slightly firm cerumen left in canal

## 2023-07-13 NOTE — Assessment & Plan Note (Signed)
Chronic, continue current medications. Will make changes to medications pending results.

## 2023-07-14 ENCOUNTER — Ambulatory Visit: Payer: 59

## 2023-07-14 DIAGNOSIS — Z0000 Encounter for general adult medical examination without abnormal findings: Secondary | ICD-10-CM | POA: Diagnosis not present

## 2023-07-14 LAB — CMP14+EGFR
ALT: 19 [IU]/L (ref 0–32)
AST: 23 [IU]/L (ref 0–40)
Albumin: 4.1 g/dL (ref 3.7–4.7)
Alkaline Phosphatase: 79 [IU]/L (ref 44–121)
BUN/Creatinine Ratio: 11 — ABNORMAL LOW (ref 12–28)
BUN: 13 mg/dL (ref 8–27)
Bilirubin Total: 0.8 mg/dL (ref 0.0–1.2)
CO2: 24 mmol/L (ref 20–29)
Calcium: 9.6 mg/dL (ref 8.7–10.3)
Chloride: 105 mmol/L (ref 96–106)
Creatinine, Ser: 1.21 mg/dL — ABNORMAL HIGH (ref 0.57–1.00)
Globulin, Total: 3.2 g/dL (ref 1.5–4.5)
Glucose: 69 mg/dL — ABNORMAL LOW (ref 70–99)
Potassium: 4.3 mmol/L (ref 3.5–5.2)
Sodium: 141 mmol/L (ref 134–144)
Total Protein: 7.3 g/dL (ref 6.0–8.5)
eGFR: 44 mL/min/{1.73_m2} — ABNORMAL LOW (ref >59)

## 2023-07-14 LAB — CBC WITH DIFFERENTIAL/PLATELET
Basophils Absolute: 0.1 10*3/uL (ref 0.0–0.2)
Basos: 1 %
EOS (ABSOLUTE): 0.1 10*3/uL (ref 0.0–0.4)
Eos: 1 %
Hematocrit: 42.8 % (ref 34.0–46.6)
Hemoglobin: 13.3 g/dL (ref 11.1–15.9)
Immature Grans (Abs): 0 10*3/uL (ref 0.0–0.1)
Immature Granulocytes: 0 %
Lymphocytes Absolute: 4.5 10*3/uL — ABNORMAL HIGH (ref 0.7–3.1)
Lymphs: 59 %
MCH: 27.3 pg (ref 26.6–33.0)
MCHC: 31.1 g/dL — ABNORMAL LOW (ref 31.5–35.7)
MCV: 88 fL (ref 79–97)
Monocytes Absolute: 0.7 10*3/uL (ref 0.1–0.9)
Monocytes: 9 %
Neutrophils Absolute: 2.3 10*3/uL (ref 1.4–7.0)
Neutrophils: 30 %
Platelets: 251 10*3/uL (ref 150–450)
RBC: 4.87 x10E6/uL (ref 3.77–5.28)
RDW: 14.6 % (ref 11.7–15.4)
WBC: 7.7 10*3/uL (ref 3.4–10.8)

## 2023-07-14 LAB — LIPID PANEL
Chol/HDL Ratio: 2.3 ratio (ref 0.0–4.4)
Cholesterol, Total: 120 mg/dL (ref 100–199)
HDL: 53 mg/dL (ref >39)
LDL Chol Calc (NIH): 53 mg/dL (ref 0–99)
Triglycerides: 65 mg/dL (ref 0–149)
VLDL Cholesterol Cal: 14 mg/dL (ref 5–40)

## 2023-07-14 LAB — TSH+FREE T4
Free T4: 1.25 ng/dL (ref 0.82–1.77)
TSH: 1.63 u[IU]/mL (ref 0.450–4.500)

## 2023-07-14 LAB — MICROALBUMIN / CREATININE URINE RATIO
Creatinine, Urine: 64.8 mg/dL
Microalb/Creat Ratio: 17 mg/g{creat} (ref 0–29)
Microalbumin, Urine: 10.8 ug/mL

## 2023-07-14 LAB — HEMOGLOBIN A1C
Est. average glucose Bld gHb Est-mCnc: 169 mg/dL
Hgb A1c MFr Bld: 7.5 % — ABNORMAL HIGH (ref 4.8–5.6)

## 2023-07-14 NOTE — Progress Notes (Signed)
Subjective:   [name] is a 88 y.o. female who presents for Medicare Annual (Subsequent) preventive examination.  Visit Complete: Virtual I connected with  [name] on 07/14/23 by a audio enabled telemedicine application and verified that I am speaking with the correct person using two identifiers.  Patient Location: Home  Provider Location: Office/Clinic  I discussed the limitations of evaluation and management by telemedicine. The patient expressed understanding and agreed to proceed.  Vital Signs: Because this visit was a virtual/telehealth visit, some criteria may be missing or patient reported. Any vitals not documented were not able to be obtained and vitals that have been documented are patient reported.    Cardiac Risk Factors include: advanced age (>10men, >11 women)     Objective:    Today's Vitals   There is no height or weight on file to calculate BMI.     07/14/2023   11:09 AM 07/02/2022    2:03 PM 12/26/2021   11:08 AM 06/19/2021    8:30 AM 06/12/2020   10:09 AM 09/01/2019    3:54 AM 08/31/2019   11:03 AM  Advanced Directives  Does Patient Have a Medical Advance Directive? No No No No No No No  Would patient like information on creating a medical advance directive?   Yes (MAU/Ambulatory/Procedural Areas - Information given)  No - Patient declined Yes (ED - Information included in AVS) Yes (ED - Information included in AVS)    Current Medications (verified) Outpatient Encounter Medications as of 07/14/2023  Medication Sig   Accu-Chek FastClix Lancets MISC USE AS DIRECTED FOR  DAILY  TESTING   ACCU-CHEK GUIDE TEST test strip USE AS DIRECTED TO CHECK BLOOD SUGAR DAILY   acetaminophen (TYLENOL) 325 MG tablet Take by mouth every 6 (six) hours as needed for mild pain.   amLODipine-olmesartan (AZOR) 5-20 MG tablet TAKE 1 TABLET BY MOUTH ONCE DAILY IN THE MORNING   ANORO ELLIPTA 62.5-25 MCG/ACT AEPB Inhale 1 puff by mouth once daily   aspirin EC  81 MG tablet Take 1 tablet (81 mg total) by mouth daily. Swallow whole.   Cholecalciferol (VITAMIN D3) 10 MCG (400 UNIT) tablet Take 400 Units by mouth daily. Per patient taking 2000 iu   empagliflozin (JARDIANCE) 25 MG TABS tablet Take 1 tablet (25 mg total) by mouth daily before breakfast.   Ergocalciferol (VITAMIN D2 PO)    Insulin Pen Needle (PEN NEEDLES 29GX1/2") 29G X MISC    LANTUS SOLOSTAR 100 UNIT/ML Solostar Pen INJECT 32 UNITS SUBCUTANEOUSLY ONCE DAILY   levothyroxine (SYNTHROID) 50 MCG tablet Take 1 tablet (50 mcg total) by mouth daily.   meclizine (ANTIVERT) 25 MG tablet TAKE 1 TABLET BY MOUTH ONCE DAILY AS NEEDED FOR DIZZINESS   metoprolol succinate (TOPROL-XL) 25 MG 24 hr tablet Take 1 tablet by mouth once daily   NAPROXEN PO    nitroGLYCERIN (NITROSTAT) 0.4 MG SL tablet Place 1 tablet (0.4 mg total) under the tongue every 5 (five) minutes x 3 doses as needed for chest pain.   RELION PEN NEEDLE 31G/8MM 31G X 8 MM MISC USE AS DIRECTED   rosuvastatin (CRESTOR) 20 MG tablet Take 1 tablet by mouth once daily   TRULICITY 4.5 MG/0.5ML SOPN INJECT 4.5 MG INTO THE SKIN AS DIRECTED ONCE A WEEK   XIIDRA 5 % SOLN Place 2 drops into both eyes 2 (two) times daily.    No facility-administered encounter medications on file as of 07/14/2023.    Allergies (verified) Patient  has no known allergies.   History: Past Medical History:  Diagnosis Date   Arthritis    Asthma    CAD (coronary artery disease)    s/p NSTEMI 10/19 - LHC:  pRCA 99, oLCx 30 >> DES to RCA; Echo 10/19: Mild focal basal septal hypertrophy, EF 60-65, inf-lat, inf and inf-sept HK, trivial MR, trivial TR   Diabetes mellitus without complication (HCC)    DKA (diabetic ketoacidoses) 08/31/2019   Hypertension    Past Surgical History:  Procedure Laterality Date   ABDOMINAL HYSTERECTOMY  "many yrs ago"   AXILLARY LYMPH NODE BIOPSY Right 02/26/2015   Procedure: RIGHT AXILLARY LYMPH NODE BIOPSY;  Surgeon: Avel Peace,  MD;  Location: [facility];  Service: General;  Laterality: Right;   CHOLECYSTECTOMY     CORONARY STENT INTERVENTION N/A 06/15/2018   Procedure: CORONARY STENT INTERVENTION;  Surgeon: Iran Ouch, MD;  Location: [facility];  Service: Cardiovascular;  Laterality: N/A;   LEFT HEART CATH AND CORONARY ANGIOGRAPHY N/A 06/15/2018   Procedure: LEFT HEART CATH AND CORONARY ANGIOGRAPHY;  Surgeon: Iran Ouch, MD;  Location: [facility];  Service: Cardiovascular;  Laterality: N/A;   Family History  Problem Relation Age of Onset   Diabetes Mother    Heart disease Mother    Diabetes Father    Diabetes Sister    Heart disease Sister    Diabetes Brother    Social History   Socioeconomic History   Marital status: Married    Spouse name: Not on file   Number of children: Not on file   Years of education: Not on file   Highest education level: Not on file  Occupational History   Occupation: retired  Tobacco Use   Smoking status: Former    Current packs/day: 0.50    Average packs/day: 0.5 packs/day for 60.0 years (30.0 ttl pk-yrs)    Types: Cigarettes   Smokeless tobacco: Never  Vaping Use   Vaping status: Never Used  Substance and Sexual Activity   Alcohol use: No   Drug use: No   Sexual activity: Not Currently  Other Topics Concern   Not on file  Social History Narrative   Not on file   Social Determinants of Health   Financial Resource Strain: Low Risk  (07/14/2023)   Overall Financial Resource Strain (CARDIA)    Difficulty of Paying Living Expenses: Not hard at all  Food Insecurity: No Food Insecurity (07/14/2023)   Hunger Vital Sign    Worried About Running Out of Food in the Last Year: Never true    Ran Out of Food in the Last Year: Never true  Transportation Needs: No Transportation Needs (07/14/2023)   PRAPARE - Administrator, Civil Service (Medical): No    Lack of Transportation (Non-Medical): No  Physical Activity: Sufficiently Active  (07/14/2023)   Exercise Vital Sign    Days of Exercise per Week: 7 days    Minutes of Exercise per Session: 60 min  Stress: No Stress Concern Present (07/14/2023)   [employer] of Occupational Health - Occupational Stress Questionnaire    Feeling of Stress : Not at all  Social Connections: Socially Integrated (07/14/2023)   Social Connection and Isolation Panel [NHANES]    Frequency of Communication with Friends and Family: More than three times a week    Frequency of Social Gatherings with Friends and Family: More than three times a week    Attends Religious Services: More than 4 times  per year    Active Member of Clubs or Organizations: Yes    Attends Banker Meetings: More than 4 times per year    Marital Status: Married    Tobacco Counseling Counseling given: Not Answered   Clinical Intake:  Pre-visit preparation completed: Yes  Pain : No/denies pain     Nutritional Risks: None Diabetes: Yes CBG done?: No Did pt. bring in CBG monitor from home?: No  How often do you need to have someone help you when you read instructions, pamphlets, or other written materials from your doctor or pharmacy?: 1 - Never  Interpreter Needed?: No  Information entered by :: NAllen LPN   Activities of Daily Living    07/14/2023   11:06 AM  In your present state of health, do you have any difficulty performing the following activities:  Hearing? 0  Vision? 0  Difficulty concentrating or making decisions? 0  Walking or climbing stairs? 0  Dressing or bathing? 0  Doing errands, shopping? 0  Preparing Food and eating ? N  Using the Toilet? N  In the past six months, have you accidently leaked urine? N  Do you have problems with loss of bowel control? N  Managing your Medications? N  Managing your Finances? N  Housekeeping or managing your Housekeeping? N    Patient Care Team: Arnette Felts, FNP as PCP - General (General Practice) Tonny Bollman, MD as PCP -  Cardiology (Cardiology) Avel Peace, MD as Consulting Physician (General Surgery) Malachy Mood, MD as Consulting Physician (Hematology) Chalmers Guest, MD as Consulting Physician (Ophthalmology)  Indicate any recent Medical Services you may have received from other than [facility] providers in the past year (date may be approximate).     Assessment:   This is a routine wellness examination for [place].  Hearing/Vision screen Hearing Screening - Comments:: Denies hearing issues Vision Screening - Comments:: Regular eye exams, Dr. Harlon Flor   Goals Addressed             This Visit's Progress    Patient Stated       07/14/2023, stay healthy       Depression Screen    07/14/2023   11:10 AM 07/13/2023    8:40 AM 12/30/2022    8:22 AM 11/02/2022    8:25 AM 07/02/2022    2:04 PM 07/01/2022    9:04 AM 04/01/2022    8:30 AM  PHQ 2/9 Scores  PHQ - 2 Score 0 0 0 0 0 0 0  PHQ- 9 Score 0 0         Fall Risk    07/14/2023   11:10 AM 07/13/2023    8:40 AM 12/30/2022    8:22 AM 11/02/2022    8:25 AM 07/02/2022    2:03 PM  Fall Risk   Falls in the past year? 0 0 0 0 0  Number falls in past yr: 0 0 0  0  Injury with Fall? 0 0 0  0  Risk for fall due to : Medication side effect No Fall Risks No Fall Risks  No Fall Risks;Medication side effect  Follow up Falls prevention discussed;Falls evaluation completed Falls evaluation completed Falls evaluation completed  Falls prevention discussed;Education provided;Falls evaluation completed    MEDICARE RISK AT HOME: Medicare Risk at Home Any stairs in or around the home?: Yes If so, are there any without handrails?: No Home free of loose throw rugs in walkways, pet beds, electrical cords, etc?: Yes Adequate lighting in  your home to reduce risk of falls?: Yes Life alert?: No Use of a cane, walker or w/c?: No Grab bars in the bathroom?: No Shower chair or bench in shower?: No Elevated toilet seat or a handicapped toilet?: Yes  TIMED UP AND  GO:  Was the test performed?  No    Cognitive Function:        07/14/2023   11:11 AM 07/02/2022    2:05 PM 06/19/2021    8:32 AM 06/12/2020   10:13 AM  6CIT Screen  What Year? 0 points 0 points 0 points 0 points  What month? 0 points 0 points 0 points 0 points  What time? 0 points 0 points 0 points 0 points  Count back from 20 0 points 0 points 0 points 0 points  Months in reverse 0 points 0 points 0 points 0 points  Repeat phrase 4 points 0 points 4 points 2 points  Total Score 4 points 0 points 4 points 2 points    Immunizations Immunization History  Administered Date(s) Administered   Hepatitis A, Adult 04/25/2022, 10/18/2022   Hepatitis B, PED/ADOLESCENT 04/25/2022, 10/18/2022   Influenza-Unspecified 06/24/2018, 04/22/2020, 04/25/2022   PFIZER Comirnaty(Radich Top)Covid-19 Tri-Sucrose Vaccine 12/11/2020   PFIZER(Purple Top)SARS-COV-2 Vaccination 10/17/2019, 11/07/2019, 06/03/2020, 06/02/2021   PNEUMOCOCCAL CONJUGATE-20 11/14/2020   Pfizer Covid-19 Vaccine Bivalent Booster 70yrs & up 05/31/2022   Pneumococcal Conjugate-13 04/11/2014   Pneumococcal Polysaccharide-23 05/16/2015   Respiratory Syncytial Virus Vaccine,Recomb Aduvanted(Arexvy) 05/02/2022   Tdap 09/13/2020   Zoster Recombinant(Shingrix) 05/04/2017, 07/04/2017   Zoster, Live 04/11/2014    TDAP status: Up to date  Flu Vaccine status: Declined, Education has been provided regarding the importance of this vaccine but patient still declined. Advised may receive this vaccine at local pharmacy or Health Dept. Aware to provide a copy of the vaccination record if obtained from local pharmacy or Health Dept. Verbalized acceptance and understanding.  Pneumococcal vaccine status: Up to date  Covid-19 vaccine status: Information provided on how to obtain vaccines.   Qualifies for Shingles Vaccine? Yes   Zostavax completed Yes   Shingrix Completed?: Yes  Screening Tests Health Maintenance  Topic Date Due   COVID-19  Vaccine (7 - 2023-24 season) 07/29/2023 (Originally 04/25/2023)   INFLUENZA VACCINE  11/22/2023 (Originally 03/25/2023)   HEMOGLOBIN A1C  10/07/2023   OPHTHALMOLOGY EXAM  10/14/2023   FOOT EXAM  10/17/2023   Medicare Annual Wellness (AWV)  07/13/2024   DTaP/Tdap/Td (2 - Td or Tdap) 09/13/2030   Pneumonia Vaccine 53+ Years old  Completed   DEXA SCAN  Completed   Zoster Vaccines- Shingrix  Completed   HPV VACCINES  Aged Out    Health Maintenance  There are no preventive care reminders to display for this patient.   Colorectal cancer screening: No longer required.   Mammogram status: Completed 05/31/2023. Repeat every year  Bone Density status: Completed 12/31/2020.   Lung Cancer Screening: (Low Dose CT Chest recommended if Age 63-80 years, 20 pack-year currently smoking OR have quit w/in 15years.) does not qualify.   Lung Cancer Screening Referral: none  Additional Screening:  Hepatitis C Screening: does not qualify;   Vision Screening: Recommended annual ophthalmology exams for early detection of glaucoma and other disorders of the eye. Is the patient up to date with their annual eye exam?  Yes  Who is the provider or what is the name of the office in which the patient attends annual eye exams? Dr. Harlon Flor If pt is not established with a provider, would  they like to be referred to a provider to establish care? No .   Dental Screening: Recommended annual dental exams for proper oral hygiene  Diabetic Foot Exam: Diabetic Foot Exam: Completed 10/16/2022  Community Resource Referral / Chronic Care Management: CRR required this visit?  No   CCM required this visit?  No     Plan:     I have personally reviewed and noted the following in the patient's chart:   Medical and social history Use of alcohol, tobacco or illicit drugs  Current medications and supplements including opioid prescriptions. Patient is not currently taking opioid prescriptions. Functional ability and  status Nutritional status Physical activity Advanced directives List of other physicians Hospitalizations, surgeries, and ER visits in previous 12 months Vitals Screenings to include cognitive, depression, and falls Referrals and appointments  In addition, I have reviewed and discussed with patient certain preventive protocols, quality metrics, and best practice recommendations. A written personalized care plan for preventive services as well as general preventive health recommendations were provided to patient.     Barb Merino, LPN   52/84/1324   After Visit Summary: (MyChart) Due to this being a telephonic visit, the after visit summary with patients personalized plan was offered to patient via MyChart   Nurse Notes: none

## 2023-07-14 NOTE — Patient Instructions (Signed)
[name] , Thank you for taking time to come for your Medicare Wellness Visit. I appreciate your ongoing commitment to your health goals. Please review the following plan we discussed and let me know if I can assist you in the future.   Referrals/Orders/Follow-Ups/Clinician Recommendations: none  This is a list of the screening recommended for you and due dates:  Health Maintenance  Topic Date Due   COVID-19 Vaccine (7 - 2023-24 season) 07/29/2023*   Flu Shot  11/22/2023*   Hemoglobin A1C  10/07/2023   Eye exam for diabetics  10/14/2023   Complete foot exam   10/17/2023   Medicare Annual Wellness Visit  07/13/2024   DTaP/Tdap/Td vaccine (2 - Td or Tdap) 09/13/2030   Pneumonia Vaccine  Completed   DEXA scan (bone density measurement)  Completed   Zoster (Shingles) Vaccine  Completed   HPV Vaccine  Aged Out  *Topic was postponed. The date shown is not the original due date.    Advanced directives: (ACP Link)Information on Advanced Care Planning can be found at [facility] [place] Advance Health Care Directives Advance Health Care Directives ([email or web address]   Next Medicare Annual Wellness Visit scheduled for next year: No, office will schedule  Insert Preventive Care attachment Insert FALL PREVENTION attachment if needed

## 2023-07-24 ENCOUNTER — Other Ambulatory Visit: Payer: Self-pay | Admitting: Nurse Practitioner

## 2023-07-28 ENCOUNTER — Other Ambulatory Visit: Payer: Self-pay | Admitting: Nurse Practitioner

## 2023-08-21 ENCOUNTER — Other Ambulatory Visit: Payer: Self-pay | Admitting: Nurse Practitioner

## 2023-09-07 ENCOUNTER — Encounter: Payer: Self-pay | Admitting: Podiatry

## 2023-09-07 ENCOUNTER — Ambulatory Visit (INDEPENDENT_AMBULATORY_CARE_PROVIDER_SITE_OTHER): Payer: 59 | Admitting: Podiatry

## 2023-09-07 DIAGNOSIS — E1142 Type 2 diabetes mellitus with diabetic polyneuropathy: Secondary | ICD-10-CM | POA: Diagnosis not present

## 2023-09-07 DIAGNOSIS — M79675 Pain in left toe(s): Secondary | ICD-10-CM

## 2023-09-07 DIAGNOSIS — B351 Tinea unguium: Secondary | ICD-10-CM | POA: Diagnosis not present

## 2023-09-07 DIAGNOSIS — M79674 Pain in right toe(s): Secondary | ICD-10-CM | POA: Diagnosis not present

## 2023-09-12 ENCOUNTER — Other Ambulatory Visit: Payer: Self-pay | Admitting: Nurse Practitioner

## 2023-09-12 DIAGNOSIS — N1832 Chronic kidney disease, stage 3b: Secondary | ICD-10-CM

## 2023-09-13 NOTE — Progress Notes (Signed)
  Subjective:  Patient ID: [name], female    DOB: [birth year],  MRN: [id number]  89 y.o. female presents at risk foot care with history of diabetic neuropathy and painful elongated mycotic toenails 1-5 bilaterally which are tender when wearing enclosed shoe gear. Pain is relieved with periodic professional debridement.  Chief Complaint  Patient presents with   Diabetes    Patient states she does not know what her A1c is , patient last seen her PCP in November of last year , patient knows her PCP name    New problem(s): None   PCP is Georgina Speaks, FNP  No Known Allergies  Review of Systems: Negative except as noted in the HPI.   Objective:  [name] is a pleasant 89 y.o. female WD, WN in NAD. AAO x 3.  Vascular Examination: Vascular status intact b/l with palpable pedal pulses. CFT immediate b/l. Pedal hair present. No edema. No pain with calf compression b/l. Skin temperature gradient WNL b/l. No varicosities noted. No cyanosis or clubbing noted.  Neurological Examination: Pt has subjective symptoms of neuropathy. Sensation grossly intact b/l with 10 gram monofilament. Vibratory sensation intact b/l.  Dermatological Examination: Pedal skin with normal turgor, texture and tone b/l. No open wounds nor interdigital macerations noted. Toenails 1-5 b/l thick, discolored, elongated with subungual debris and pain on dorsal palpation. No hyperkeratotic lesions noted b/l.   Musculoskeletal Examination: Muscle strength 5/5 to b/l LE.  No pain, crepitus noted b/l. HAV with bunion bilaterally and hammertoes 2-5 b/l. Pes planus deformity noted bilateral LE.  Radiographs: None  Last A1c:      Latest Ref Rng & Units 07/13/2023    9:19 AM 04/06/2023    9:17 AM 11/02/2022    8:44 AM  Hemoglobin A1C  Hemoglobin-A1c 4.8 - 5.6 % 7.5  8.2  7.9      Assessment:   1. Pain due to onychomycosis of toenails of both feet   2. Diabetic peripheral neuropathy associated with  type 2 diabetes mellitus (HCC)    Plan:  -Consent given for treatment as described below: -Examined patient. -Patient to continue soft, supportive shoe gear daily. -Mycotic toenails 1-5 bilaterally were debrided in length and girth with sterile nail nippers and dremel without incident. -Patient/POA to call should there be question/concern in the interim.  Return in about 3 months (around 12/06/2023).  Delon LITTIE Merlin, DPM      [place] LOCATION: [address], [place] [address]                   Office [phone]   [facility] LOCATION: [address] [place], [place] [address] Office [phone]

## 2023-09-20 ENCOUNTER — Other Ambulatory Visit: Payer: Self-pay | Admitting: Nurse Practitioner

## 2023-09-22 ENCOUNTER — Other Ambulatory Visit: Payer: Self-pay | Admitting: Nurse Practitioner

## 2023-09-22 ENCOUNTER — Other Ambulatory Visit: Payer: Self-pay | Admitting: Cardiovascular Disease

## 2023-09-23 ENCOUNTER — Other Ambulatory Visit: Payer: Self-pay | Admitting: Nurse Practitioner

## 2023-09-24 ENCOUNTER — Encounter: Payer: Self-pay | Admitting: *Deleted

## 2023-09-27 ENCOUNTER — Encounter: Payer: Self-pay | Admitting: Cardiovascular Disease

## 2023-09-27 ENCOUNTER — Ambulatory Visit: Payer: 59 | Attending: Cardiovascular Disease | Admitting: Cardiovascular Disease

## 2023-09-27 VITALS — BP 140/80 | HR 77 | Ht 63.0 in | Wt 144.4 lb

## 2023-09-27 DIAGNOSIS — I10 Essential (primary) hypertension: Secondary | ICD-10-CM | POA: Diagnosis not present

## 2023-09-27 DIAGNOSIS — E7800 Pure hypercholesterolemia, unspecified: Secondary | ICD-10-CM

## 2023-09-27 DIAGNOSIS — I2510 Atherosclerotic heart disease of native coronary artery without angina pectoris: Secondary | ICD-10-CM | POA: Diagnosis not present

## 2023-09-27 NOTE — Assessment & Plan Note (Signed)
The patient is stable with no symptoms of exertional angina.  She will remain on aspirin for antiplatelet therapy, rosuvastatin, and metoprolol succinate.  I will see her back in 1 year for follow-up evaluation.  At her advanced age she is doing an excellent job of staying active with her 3 mile daily walk.

## 2023-09-27 NOTE — Progress Notes (Signed)
Cardiology Office Note:    Date:  09/27/2023   ID:  [name], DOB 1935/04/21, MRN [id number]  PCP:  Arnette Felts, FNP   [facility] Providers Cardiologist:  Tonny Bollman, MD     Referring MD: Arnette Felts, FNP   Chief Complaint  Patient presents with   Coronary Artery Disease    History of Present Illness:    [name] is a 89 y.o. female with a hx of:  Coronary artery disease NSTEMI 05/2018 s/p 2.5 x 18 mm DES to the proximal RCA TTE 06/16/18: Mild focal basal septal hypertrophy, EF 60-65, inf-lat, inf and inf-sept HK, trivial MR, trivial TR Diabetes mellitus Hypertension Hyperlipidemia Lymphoproliferative disorder Chronic kidney disease   She is here alone today. Reports that she's doing well from a cardiac perspective. She's had no symptoms like those she experienced with her NSTEMI in 2019. Back then, she had SSCP with walking up a hill at [facility]. She walks 3 miles/day and has no symptoms. She complains of 'heartburn' at night. Feels a burning when she lays down at night. She also reports a vertigo feeling that she has from time to time. No other complaints today. Today, she denies symptoms of palpitations, chest pain, shortness of breath, orthopnea, PND, lower extremity edema, dizziness, or syncope.    Current Medications: Current Meds  Medication Sig   Accu-Chek FastClix Lancets MISC USE AS DIRECTED FOR  DAILY  TESTING   ACCU-CHEK GUIDE TEST test strip USE AS DIRECTED TO CHECK BLOOD SUGAR DAILY   acetaminophen (TYLENOL) 325 MG tablet Take by mouth every 6 (six) hours as needed for mild pain.   amLODipine-olmesartan (AZOR) 5-20 MG tablet TAKE 1 TABLET BY MOUTH ONCE DAILY IN THE MORNING   ANORO ELLIPTA 62.5-25 MCG/ACT AEPB Inhale 1 puff by mouth once daily   aspirin EC 81 MG tablet Take 1 tablet (81 mg total) by mouth daily. Swallow whole.   Cholecalciferol (VITAMIN D3) 10 MCG (400 UNIT) tablet Take 400 Units by mouth daily. Per  patient taking 2000 iu   empagliflozin (JARDIANCE) 25 MG TABS tablet TAKE 1 TABLET BY MOUTH ONCE DAILY BEFORE BREAKFAST   Ergocalciferol (VITAMIN D2 PO)    Insulin Pen Needle (PEN NEEDLES 29GX1/2") 29G X MISC    LANTUS SOLOSTAR 100 UNIT/ML Solostar Pen INJECT 32 UNITS SUBCUTANEOUSLY ONCE DAILY   levothyroxine (SYNTHROID) 50 MCG tablet Take 1 tablet by mouth once daily   meclizine (ANTIVERT) 25 MG tablet TAKE 1 TABLET BY MOUTH ONCE DAILY AS NEEDED FOR DIZZINESS   metoprolol succinate (TOPROL-XL) 25 MG 24 hr tablet Take 1 tablet by mouth once daily   NAPROXEN PO    nitroGLYCERIN (NITROSTAT) 0.4 MG SL tablet Place 1 tablet (0.4 mg total) under the tongue every 5 (five) minutes x 3 doses as needed for chest pain.   RELION PEN NEEDLE 31G/8MM 31G X 8 MM MISC USE AS DIRECTED   rosuvastatin (CRESTOR) 20 MG tablet Take 1 tablet by mouth once daily   TRULICITY 4.5 MG/0.5ML SOPN INJECT 4.5 MG INTO THE SKIN AS DIRECTED ONCE A WEEK   XIIDRA 5 % SOLN Place 2 drops into both eyes 2 (two) times daily.      Allergies:   Patient has no known allergies.   ROS:   Please see the history of present illness.    All other systems reviewed and are negative.  EKGs/Labs/Other Studies Reviewed:    The following studies were reviewed today: Cardiac Studies &  Procedures   CARDIAC CATHETERIZATION  CARDIAC CATHETERIZATION 06/15/2018  Narrative  Ost Cx to Prox Cx lesion is 30% stenosed.  Prox RCA lesion is 99% stenosed.  Post intervention, there is a 0% residual stenosis.  A drug-eluting stent was successfully placed using a STENT SIERRA 2.50 X 18 MM.  1.  Severe one-vessel coronary artery disease with 99% proximal RCA stenosis which is moderately calcified and very fibrotic. 2.  Normal left ventricular end-diastolic pressure. 3.  Successful angioplasty and drug-eluting stent placement to the proximal right coronary artery.  Fully dilating the lesion was difficult and required using a noncompliant  balloon to 20 atm before placing the stent.  Recommendations:  Recommend uninterrupted dual antiplatelet therapy with Aspirin 81mg  daily and Clopidogrel 75mg  daily for a minimum of 12 months (ACS - Class I recommendation).  Recommend aggressive treatment of risk factors and cardiac rehab. Obtain an echocardiogram to evaluate LV systolic function.  Pigtail catheter was not advanced due to radial artery spasm.  The patient has moderate tortuosity in the innominate artery.  Findings Coronary Findings Diagnostic  Dominance: Right  Left Main Vessel is angiographically normal.  Left Anterior Descending The vessel exhibits minimal luminal irregularities.  First Diagonal Branch Vessel is angiographically normal.  Second Diagonal Branch Vessel is angiographically normal.  Third Diagonal Branch Vessel is angiographically normal.  Left Circumflex The vessel exhibits minimal luminal irregularities. Ost Cx to Prox Cx lesion is 30% stenosed.  First Obtuse Marginal Branch Vessel is angiographically normal.  Second Obtuse Marginal Branch Vessel is angiographically normal.  Third Obtuse Marginal Branch Vessel is angiographically normal.  Right Coronary Artery Prox RCA lesion is 99% stenosed. The lesion is type C. The lesion is moderately calcified.  Intervention  Prox RCA lesion Stent Lesion length:  14 mm. CATHETER LAUNCHER 6FR JR4 guide catheter was inserted. Lesion crossed with guidewire using a WIRE RUNTHROUGH .[id number]. Pre-stent angioplasty was performed using a BALLOON SAPPHIRE 2.0X12. Maximum pressure:  14 atm. Inflation time:  20 sec. A drug-eluting stent was successfully placed using a STENT SIERRA 2.50 X 18 MM. Maximum pressure: 14 atm. Inflation time: 20 sec. Post-stent angioplasty was performed using a BALLOON SAPPHIRE [place] [id number]. Maximum pressure:  14 atm. Inflation time:  15 sec. Lesion was calcified and very fibrotic.  I could not fully dilate the lesion with a 2.0  balloon in spite of high pressure.  Thus, I used a 2.25 x 12 mm noncompliant balloon which had to be inflated to 20 atm in order to fully inflate the lesion. During stent deployment, the patient took a deep breath and unfortunately the stent moved more proximal during deployment.  Nonetheless, it covered the lesion.  Thus, I did not add a more distal stent. Post-Intervention Lesion Assessment The intervention was successful. Pre-interventional TIMI flow is 3. Post-intervention TIMI flow is 3. No complications occurred at this lesion. There is a 0% residual stenosis post intervention.    ECHOCARDIOGRAM  ECHOCARDIOGRAM COMPLETE 06/16/2018  Narrative *[place]* *[facility]* [address] [place], [place] [address] [phone]  ------------------------------------------------------------------- Transthoracic Echocardiography  Patient:    Jaquel, Coomer MR #:       [id number] Study Date: 06/16/2018 Gender:     F Age:        49 Height:     160 cm Weight:     62 kg BSA:        1.67 m^2 Pt. Status: Room:       6C04C  ADMITTING  Charlton Haws, M.D. ATTENDING    Tonny Bollman, MD ORDERING     Lorine Bears, MD REFERRING    Lorine Bears, MD PERFORMING   [facility], Inpatient SONOGRAPHER  Sheralyn Boatman  cc:  ------------------------------------------------------------------- LV EF: 60% -   65%  ------------------------------------------------------------------- Indications:      MI - acute 410.91.  ------------------------------------------------------------------- History:   Risk factors:  Hypertension. Diabetes mellitus. Dyslipidemia.  ------------------------------------------------------------------- Study Conclusions  - Left ventricle: The cavity size was normal. There was mild focal basal hypertrophy of the septum. Systolic function was normal. The estimated ejection fraction was in the range of 60% to 65%. Mild hypokinesis of the inferolateral,  inferior, and inferoseptal myocardium. - Aortic valve: There was no significant regurgitation. - Mitral valve: There was trivial regurgitation. - Atrial septum: No defect or patent foramen ovale was identified. - Tricuspid valve: There was trivial regurgitation. - Pulmonic valve: There was no significant regurgitation.  Impressions:  - Overall preserved EF with mild hypokinesis of inferior, inferseptal, and inferolateral wall.  ------------------------------------------------------------------- Study data:  No prior study was available for comparison.  Study status:  Routine.  Procedure:  The patient reported no pain pre or post test. Transthoracic echocardiography. Image quality was adequate.  Study completion:  There were no complications. Transthoracic echocardiography.  M-mode, complete 2D, spectral Doppler, and color Doppler.  Birthdate:  Patient birthdate: 01-08-1935.  Age:  Patient is 89 yr old.  Sex:  Gender: female. BMI: 24.2 kg/m^2.  Blood pressure:     142/66  Patient status: Inpatient.  Study date:  Study date: 06/16/2018. Study time: 09:57 AM.  Location:  Bedside.  -------------------------------------------------------------------  ------------------------------------------------------------------- Left ventricle:  The cavity size was normal. There was mild focal basal hypertrophy of the septum. Systolic function was normal. The estimated ejection fraction was in the range of 60% to 65%. Regional wall motion abnormalities:   Mild hypokinesis of the inferolateral, inferior, and inferoseptal myocardium.  ------------------------------------------------------------------- Aortic valve:   Trileaflet; mildly thickened, mildly calcified leaflets. Mobility was not restricted.  Doppler:  Transvalvular velocity was within the normal range. There was no stenosis. There was no significant  regurgitation.  ------------------------------------------------------------------- Aorta:  Aortic root: The aortic root was normal in size.  ------------------------------------------------------------------- Mitral valve:   Structurally normal valve.   Mobility was not restricted.  Doppler:  Transvalvular velocity was within the normal range. There was no evidence for stenosis. There was trivial regurgitation.    Valve area by pressure half-time: 3.67 cm^2. Indexed valve area by pressure half-time: 2.2 cm^2/m^2.    Peak gradient (D): 3 mm Hg.  ------------------------------------------------------------------- Left atrium:  The atrium was normal in size.  ------------------------------------------------------------------- Atrial septum:  No defect or patent foramen ovale was identified.  ------------------------------------------------------------------- Right ventricle:  The cavity size was normal. Wall thickness was normal. Systolic function was low normal.  ------------------------------------------------------------------- Pulmonic valve:    The valve appears to be grossly normal. Doppler:  Transvalvular velocity was within the normal range. There was no evidence for stenosis. There was no significant regurgitation.  ------------------------------------------------------------------- Tricuspid valve:   Structurally normal valve.    Doppler: Transvalvular velocity was within the normal range. There was trivial regurgitation.  ------------------------------------------------------------------- Pulmonary artery:   The main pulmonary artery was normal-sized. Systolic pressure could not be accurately estimated.  ------------------------------------------------------------------- Right atrium:  The atrium was normal in size.  ------------------------------------------------------------------- Pericardium:  The pericardium was normal in appearance. There was no pericardial  effusion.  ------------------------------------------------------------------- Systemic veins: Inferior vena cava: The vessel was normal in size.  -------------------------------------------------------------------  Measurements  Left ventricle                           Value          Reference LV ID, ED, PLAX chordal        (L)       29    mm       43 - 52 LV ID, ES, PLAX chordal        (L)       19    mm       23 - 38 LV fx shortening, PLAX chordal           34    %        >=29 LV PW thickness, ED                      10    mm       ---------- IVS/LV PW ratio, ED                      1.3            <=1.3 Stroke volume, 2D                        59    ml       ---------- Stroke volume/bsa, 2D                    35    ml/m^2   ---------- LV ejection fraction, 1-p A4C            59    %        ---------- LV end-diastolic volume, 2-p             44    ml       ---------- LV end-systolic volume, 2-p              17    ml       ---------- LV ejection fraction, 2-p                63    %        ---------- Stroke volume, 2-p                       28    ml       ---------- LV end-diastolic volume/bsa,             26    ml/m^2   ---------- 2-p LV end-systolic volume/bsa,              10    ml/m^2   ---------- 2-p Stroke volume/bsa, 2-p                   16.5  ml/m^2   ---------- LV e&', lateral                           8.59  cm/s     ---------- LV E/e&', lateral                         10.17          ---------- LV e&', medial  5.98  cm/s     ---------- LV E/e&', medial                          14.62          ---------- LV e&', average                           7.29  cm/s     ---------- LV E/e&', average                         12             ----------  Ventricular septum                       Value          Reference IVS thickness, ED                        13    mm       ----------  LVOT                                     Value          Reference LVOT ID, S                                16    mm       ---------- LVOT area                                2.01  cm^2     ---------- LVOT peak velocity, S                    137   cm/s     ---------- LVOT mean velocity, S                    85.7  cm/s     ---------- LVOT VTI, S                              29.5  cm       ---------- LVOT peak gradient, S                    8     mm Hg    ----------  Aorta                                    Value          Reference Aortic root ID, ED                       32    mm       ----------  Left atrium                              Value  Reference LA ID, A-P, ES                           24    mm       ---------- LA ID/bsa, A-P                           1.44  cm/m^2   <=2.2 LA volume, S                             32.9  ml       ---------- LA volume/bsa, S                         19.7  ml/m^2   ---------- LA volume, ES, 1-p A4C                   33.1  ml       ---------- LA volume/bsa, ES, 1-p A4C               19.8  ml/m^2   ---------- LA volume, ES, 1-p A2C                   32.3  ml       ---------- LA volume/bsa, ES, 1-p A2C               19.3  ml/m^2   ----------  Mitral valve                             Value          Reference Mitral E-wave peak velocity              87.4  cm/s     ---------- Mitral A-wave peak velocity              85.3  cm/s     ---------- Mitral deceleration time                 204   ms       150 - 230 Mitral pressure half-time                60    ms       ---------- Mitral peak gradient, D                  3     mm Hg    ---------- Mitral E/A ratio, peak                   1              ---------- Mitral valve area, PHT, DP               3.67  cm^2     ---------- Mitral valve area/bsa, PHT, DP           2.2   cm^2/m^2 ----------  Right atrium                             Value          Reference RA ID, S-I, ES, A4C            (L)  33.4  mm       34 - 49 RA area, ES, A4C                         9.55  cm^2     8.3 -  19.5 RA volume, ES, A/L                       22.6  ml       ---------- RA volume/bsa, ES, A/L                   13.5  ml/m^2   ----------  Right ventricle                          Value          Reference TAPSE                                    16.3  mm       ----------  Legend: (L)  and  (H)  mark values outside specified reference range.  ------------------------------------------------------------------- Prepared and Electronically Authenticated by  Jodelle Red 2019-10-24T11:25:28             EKG:        Recent Labs: 07/13/2023: ALT 19; BUN 13; Creatinine, Ser 1.21; Hemoglobin 13.3; Platelets 251; Potassium 4.3; Sodium 141; TSH 1.630  Recent Lipid Panel    Component Value Date/Time   CHOL 120 07/13/2023 0919   TRIG 65 07/13/2023 0919   HDL 53 07/13/2023 0919   CHOLHDL 2.3 07/13/2023 0919   CHOLHDL 2.6 06/15/2018 0542   VLDL 29 06/15/2018 0542   LDLCALC 53 07/13/2023 0919         Physical Exam:    VS:  BP (!) 140/80   Pulse 77   Ht 5\' 3"  (1.6 m)   Wt 144 lb 6.4 oz (65.5 kg)   SpO2 93%   BMI 25.58 kg/m     Wt Readings from Last 3 Encounters:  09/27/23 144 lb 6.4 oz (65.5 kg)  07/13/23 146 lb 9.6 oz (66.5 kg)  04/06/23 144 lb 9.6 oz (65.6 kg)     GEN:  Well nourished, well developed in no acute distress HEENT: Normal NECK: No JVD; No carotid bruits LYMPHATICS: No lymphadenopathy CARDIAC: RRR, no murmurs, rubs, gallops RESPIRATORY:  Clear to auscultation without rales, wheezing or rhonchi  ABDOMEN: Soft, non-tender, non-distended MUSCULOSKELETAL:  No edema; No deformity  SKIN: Warm and dry NEUROLOGIC:  Alert and oriented x 3 PSYCHIATRIC:  Normal affect   Assessment & Plan Coronary artery disease involving native coronary artery of native heart without angina pectoris The patient is stable with no symptoms of exertional angina.  She will remain on aspirin for antiplatelet therapy, rosuvastatin, and metoprolol succinate.  I will see her  back in 1 year for follow-up evaluation.  At her advanced age she is doing an excellent job of staying active with her 3 mile daily walk. Primary hypertension Blood pressure treated with amlodipine, olmesartan, metoprolol succinate.  Continue current management.  Recent creatinine is 1.21 and potassium is 4.3. Pure hypercholesterolemia Lipids are treated with rosuvastatin 20 mg daily with recent LDL cholesterol of 53.  ALT is normal at 19.  Continue current management.  Overall the patient appears stable from a cardiovascular perspective.  She will follow-up with her primary care physician regarding her acid reflux symptoms that are occurring at nighttime.  I will see her back in 1 year unless problems arise in the interim.            Medication Adjustments/Labs and Tests Ordered: Current medicines are reviewed at length with the patient today.  Concerns regarding medicines are outlined above.  No orders of the defined types were placed in this encounter.  No orders of the defined types were placed in this encounter.   Patient Instructions  Follow-Up: At [facility], you and your health needs are our priority.  As part of our continuing mission to provide you with exceptional heart care, we have created designated Provider Care Teams.  These Care Teams include your primary Cardiologist (physician) and Advanced Practice Providers (APPs -  Physician Assistants and Nurse Practitioners) who all work together to provide you with the care you need, when you need it.  We recommend signing up for the patient portal called "MyChart".  Sign up information is provided on this After Visit Summary.  MyChart is used to connect with patients for Virtual Visits (Telemedicine).  Patients are able to view lab/test results, encounter notes, upcoming appointments, etc.  Non-urgent messages can be sent to your provider as well.   To learn more about what you can do with MyChart, go to  [email or web address].    Your next appointment:   1 year(s)  Provider:   Tonny Bollman, MD     Other Instructions   1st Floor: - Lobby - Registration  - Pharmacy  - Lab - Cafe  2nd Floor: - PV Lab - Diagnostic Testing (echo, CT, nuclear med)  3rd Floor: - Vacant  4th Floor: - TCTS (cardiothoracic surgery) - AFib Clinic - Structural Heart Clinic - Vascular Surgery  - Vascular Ultrasound  5th Floor: - HeartCare Cardiology (general and EP) - Clinical Pharmacy for coumadin, hypertension, lipid, weight-loss medications, and med management appointments    Valet parking services will be available as well.          Signed, Tonny Bollman, MD  09/27/2023 1:35 PM     [facility]

## 2023-09-27 NOTE — Patient Instructions (Signed)
 Follow-Up: At [facility], you and your health needs are our priority.  As part of our continuing mission to provide you with exceptional heart care, we have created designated Provider Care Teams.  These Care Teams include your primary Cardiologist (physician) and Advanced Practice Providers (APPs -  Physician Assistants and Nurse Practitioners) who all work together to provide you with the care you need, when you need it.  We recommend signing up for the patient portal called "MyChart".  Sign up information is provided on this After Visit Summary.  MyChart is used to connect with patients for Virtual Visits (Telemedicine).  Patients are able to view lab/test results, encounter notes, upcoming appointments, etc.  Non-urgent messages can be sent to your provider as well.   To learn more about what you can do with MyChart, go to [email or web address].    Your next appointment:   1 year(s)  Provider:   Tonny Bollman, MD     Other Instructions   1st Floor: - Lobby - Registration  - Pharmacy  - Lab - Cafe  2nd Floor: - PV Lab - Diagnostic Testing (echo, CT, nuclear med)  3rd Floor: - Vacant  4th Floor: - TCTS (cardiothoracic surgery) - AFib Clinic - Structural Heart Clinic - Vascular Surgery  - Vascular Ultrasound  5th Floor: - HeartCare Cardiology (general and EP) - Clinical Pharmacy for coumadin, hypertension, lipid, weight-loss medications, and med management appointments    Valet parking services will be available as well.

## 2023-09-27 NOTE — Assessment & Plan Note (Signed)
Lipids are treated with rosuvastatin 20 mg daily with recent LDL cholesterol of 53.  ALT is normal at 19.  Continue current management.  Overall the patient appears stable from a cardiovascular perspective.  She will follow-up with her primary care physician regarding her acid reflux symptoms that are occurring at nighttime.  I will see her back in 1 year unless problems arise in the interim.

## 2023-09-27 NOTE — Assessment & Plan Note (Signed)
Blood pressure treated with amlodipine, olmesartan, metoprolol succinate.  Continue current management.  Recent creatinine is 1.21 and potassium is 4.3.

## 2023-10-02 ENCOUNTER — Other Ambulatory Visit: Payer: Self-pay | Admitting: Nurse Practitioner

## 2023-10-14 ENCOUNTER — Other Ambulatory Visit: Payer: Self-pay | Admitting: Nurse Practitioner

## 2023-10-17 ENCOUNTER — Other Ambulatory Visit: Payer: Self-pay | Admitting: Nurse Practitioner

## 2023-10-19 ENCOUNTER — Other Ambulatory Visit: Payer: Self-pay | Admitting: Cardiovascular Disease

## 2023-10-19 DIAGNOSIS — I2510 Atherosclerotic heart disease of native coronary artery without angina pectoris: Secondary | ICD-10-CM

## 2023-10-27 ENCOUNTER — Other Ambulatory Visit: Payer: Self-pay | Admitting: Nurse Practitioner

## 2023-11-11 ENCOUNTER — Ambulatory Visit (INDEPENDENT_AMBULATORY_CARE_PROVIDER_SITE_OTHER): Payer: Self-pay | Admitting: Nurse Practitioner

## 2023-11-11 ENCOUNTER — Encounter: Payer: Self-pay | Admitting: Nurse Practitioner

## 2023-11-11 VITALS — BP 136/70 | HR 73 | Temp 97.5°F | Ht 63.0 in | Wt 146.8 lb

## 2023-11-11 DIAGNOSIS — I129 Hypertensive chronic kidney disease with stage 1 through stage 4 chronic kidney disease, or unspecified chronic kidney disease: Secondary | ICD-10-CM

## 2023-11-11 DIAGNOSIS — N183 Chronic kidney disease, stage 3 unspecified: Secondary | ICD-10-CM

## 2023-11-11 DIAGNOSIS — E039 Hypothyroidism, unspecified: Secondary | ICD-10-CM

## 2023-11-11 DIAGNOSIS — E785 Hyperlipidemia, unspecified: Secondary | ICD-10-CM

## 2023-11-11 DIAGNOSIS — Z794 Long term (current) use of insulin: Secondary | ICD-10-CM

## 2023-11-11 DIAGNOSIS — E1122 Type 2 diabetes mellitus with diabetic chronic kidney disease: Secondary | ICD-10-CM | POA: Diagnosis not present

## 2023-11-11 DIAGNOSIS — R12 Heartburn: Secondary | ICD-10-CM | POA: Diagnosis not present

## 2023-11-11 DIAGNOSIS — M25561 Pain in right knee: Secondary | ICD-10-CM | POA: Diagnosis not present

## 2023-11-11 DIAGNOSIS — Z2821 Immunization not carried out because of patient refusal: Secondary | ICD-10-CM | POA: Diagnosis not present

## 2023-11-11 DIAGNOSIS — Z6825 Body mass index (BMI) 25.0-25.9, adult: Secondary | ICD-10-CM

## 2023-11-11 DIAGNOSIS — N1832 Chronic kidney disease, stage 3b: Secondary | ICD-10-CM

## 2023-11-11 MED ORDER — OMEPRAZOLE 20 MG PO CPDR
DELAYED_RELEASE_CAPSULE | ORAL | 0 refills | Status: DC
Start: 2023-11-11 — End: 2023-11-24

## 2023-11-11 NOTE — Assessment & Plan Note (Signed)

## 2023-11-11 NOTE — Assessment & Plan Note (Signed)
Chronic, stable. Continue statin, tolerating well.

## 2023-11-11 NOTE — Progress Notes (Signed)
 Madelaine Bhat, CMA,acting as a Neurosurgeon for Arnette Felts, FNP.,have documented all relevant documentation on the behalf of Arnette Felts, FNP,as directed by  Arnette Felts, FNP while in the presence of Arnette Felts, FNP.  Subjective:  Patient ID: [name] , female    DOB: 08/28/1934 , 89 y.o.   MRN: [id number]  Chief Complaint  Patient presents with   Hypertension   Diabetes    HPI  Patient presents today for a bp and dm follow up, Patient reports compliance with medication. Patient denies any chest pain, SOB, or headaches. Patient reports right knee pain, she has been walking a lot at least 3 miles per day, reports her knee is feeling better since she has been resting it, she has taken tylenol. Patient reports she has really bad heart burn, first started 3 weeks ago.   Knee Pain   Heartburn She complains of heartburn. She reports no chest pain. This is a new problem. The current episode started 1 to 4 weeks ago. The problem has been gradually worsening. The heartburn does not wake her from sleep. The heartburn does not limit her activity. The heartburn doesn't change with position. The symptoms are aggravated by lying down. Pertinent negatives include no fatigue. She has tried nothing for the symptoms. Past procedures do not include an abdominal ultrasound.  Diabetes She presents for her follow-up diabetic visit. She has type 2 diabetes mellitus. Her disease course has been worsening. There are no hypoglycemic associated symptoms. Pertinent negatives for hypoglycemia include no headaches. Pertinent negatives for diabetes include no chest pain, no fatigue, no polydipsia, no polyphagia and no polyuria. There are no hypoglycemic complications. Symptoms are improving. There are no diabetic complications. Risk factors for coronary artery disease include diabetes mellitus and sedentary lifestyle. Current diabetic treatment includes oral agent (dual therapy). She is compliant with treatment all  of the time. She is following a generally healthy diet. When asked about meal planning, she reported none. She participates in exercise daily. There is no change in her home blood glucose trend. (Reports blood sugars averaging 100's. ) She sees a podiatrist Dolores Patty).Eye exam is current.  Hypertension This is a chronic problem. The current episode started more than 1 year ago. The problem has been gradually improving since onset. The problem is controlled. Pertinent negatives include no anxiety, chest pain, headaches, palpitations or shortness of breath. There are no associated agents to hypertension. Risk factors for coronary artery disease include diabetes mellitus and dyslipidemia. Past treatments include angiotensin blockers, calcium channel blockers and beta blockers. There are no compliance problems.  Hypertensive end-organ damage includes kidney disease and CAD/MI. There is no history of angina. Identifiable causes of hypertension include chronic renal disease and a thyroid problem.     Past Medical History:  Diagnosis Date   Arthritis    Asthma    CAD (coronary artery disease)    s/p NSTEMI 10/19 - LHC:  pRCA 99, oLCx 30 >> DES to RCA; Echo 10/19: Mild focal basal septal hypertrophy, EF 60-65, inf-lat, inf and inf-sept HK, trivial MR, trivial TR   Diabetes mellitus without complication (HCC)    DKA (diabetic ketoacidoses) 08/31/2019   Hypertension      Family History  Problem Relation Age of Onset   Diabetes Mother    Heart disease Mother    Diabetes Father    Diabetes Sister    Heart disease Sister    Diabetes Brother      Current Outpatient Medications:  Accu-Chek FastClix Lancets MISC, USE AS DIRECTED FOR  DAILY  TESTING, Disp: 102 each, Rfl: 0   ACCU-CHEK GUIDE TEST test strip, USE AS DIRECTED TO CHECK BLOOD SUGAR DAILY, Disp: 100 each, Rfl: 0   acetaminophen (TYLENOL) 325 MG tablet, Take by mouth every 6 (six) hours as needed for mild pain., Disp: , Rfl:     amLODipine-olmesartan (AZOR) 5-20 MG tablet, TAKE 1 TABLET BY MOUTH ONCE DAILY IN THE MORNING, Disp: 90 tablet, Rfl: 1   ANORO ELLIPTA 62.5-25 MCG/ACT AEPB, Inhale 1 puff by mouth once daily, Disp: 60 each, Rfl: 0   aspirin EC 81 MG tablet, Take 1 tablet (81 mg total) by mouth daily. Swallow whole., Disp: 90 tablet, Rfl: 3   Cholecalciferol (VITAMIN D3) 10 MCG (400 UNIT) tablet, Take 400 Units by mouth daily. Per patient taking 2000 iu, Disp: , Rfl:    empagliflozin (JARDIANCE) 25 MG TABS tablet, TAKE 1 TABLET BY MOUTH ONCE DAILY BEFORE BREAKFAST, Disp: 90 tablet, Rfl: 1   Ergocalciferol (VITAMIN D2 PO), , Disp: , Rfl:    Insulin Pen Needle (PEN NEEDLES 29GX1/2") 29G X MISC, , Disp: , Rfl:    LANTUS SOLOSTAR 100 UNIT/ML Solostar Pen, INJECT 32 UNITS SUBCUTANEOUSLY ONCE DAILY, Disp: 15 mL, Rfl: 0   levothyroxine (SYNTHROID) 50 MCG tablet, Take 1 tablet by mouth once daily, Disp: 90 tablet, Rfl: 0   meclizine (ANTIVERT) 25 MG tablet, TAKE 1 TABLET BY MOUTH ONCE DAILY AS NEEDED FOR DIZZINESS, Disp: 30 tablet, Rfl: 0   metoprolol succinate (TOPROL-XL) 25 MG 24 hr tablet, Take 1 tablet by mouth once daily, Disp: 30 tablet, Rfl: 0   NAPROXEN PO, , Disp: , Rfl:    nitroGLYCERIN (NITROSTAT) 0.4 MG SL tablet, Place 1 tablet (0.4 mg total) under the tongue every 5 (five) minutes x 3 doses as needed for chest pain., Disp: 25 tablet, Rfl: 7   omeprazole (PRILOSEC) 20 MG capsule, Take 1 tablet by mouth daily x 2 weeks, Disp: 14 capsule, Rfl: 0   RELION PEN NEEDLE 31G/8MM 31G X 8 MM MISC, USE AS DIRECTED, Disp: 100 each, Rfl: 0   rosuvastatin (CRESTOR) 20 MG tablet, Take 1 tablet by mouth once daily, Disp: 90 tablet, Rfl: 3   TRULICITY 4.5 MG/0.5ML SOPN, INJECT 4.5 MG INTO THE SKIN AS DIRECTED ONCE A WEEK, Disp: 24 mL, Rfl: 0   XIIDRA 5 % SOLN, Place 2 drops into both eyes 2 (two) times daily. , Disp: , Rfl:    No Known Allergies   Review of Systems  Constitutional: Negative.  Negative for fatigue.   HENT: Negative.    Eyes: Negative.   Respiratory: Negative.  Negative for shortness of breath.   Cardiovascular: Negative.  Negative for chest pain and palpitations.  Gastrointestinal:  Positive for heartburn.  Endocrine: Negative for polydipsia, polyphagia and polyuria.  Musculoskeletal:        Right knee pain  Neurological: Negative.  Negative for headaches.  Psychiatric/Behavioral: Negative.       Today's Vitals   11/11/23 0831  BP: 136/70  Pulse: 73  Temp: (!) 97.5 F (36.4 C)  TempSrc: Oral  Weight: 146 lb 12.8 oz (66.6 kg)  Height: 5\' 3"  (1.6 m)  PainSc: 9   PainLoc: Knee   Body mass index is 26 kg/m.  Wt Readings from Last 3 Encounters:  11/11/23 146 lb 12.8 oz (66.6 kg)  09/27/23 144 lb 6.4 oz (65.5 kg)  07/13/23 146 lb 9.6 oz (66.5 kg)  Objective:  Physical Exam Vitals and nursing note reviewed.  Constitutional:      General: She is not in acute distress.    Appearance: Normal appearance. She is well-developed.  HENT:     Right Ear: Hearing normal.     Left Ear: Hearing normal.  Eyes:     General: Lids are normal.     Extraocular Movements: Extraocular movements intact.     Pupils: Pupils are equal, round, and reactive to light.     Funduscopic exam:    Right eye: No papilledema.        Left eye: No papilledema.  Neck:     Thyroid: No thyroid mass.  Cardiovascular:     Rate and Rhythm: Normal rate and regular rhythm.     Pulses: Normal pulses.     Heart sounds: Normal heart sounds. No murmur heard. Pulmonary:     Effort: Pulmonary effort is normal. No respiratory distress.     Breath sounds: Normal breath sounds. No wheezing.  Chest:     Chest wall: No mass.  Breasts:    Right: Normal. No mass or tenderness.     Left: Normal. No mass or tenderness.  Musculoskeletal:        General: Tenderness (right lateral knee) present. No signs of injury.     Cervical back: Full passive range of motion without pain.     Right knee: Crepitus present.  Normal range of motion.     Left knee: Crepitus present. Normal range of motion.     Right lower leg: No edema.     Left lower leg: No edema.  Lymphadenopathy:     Upper Body:     Right upper body: No supraclavicular, axillary or pectoral adenopathy.     Left upper body: No supraclavicular, axillary or pectoral adenopathy.  Skin:    General: Skin is warm and dry.     Capillary Refill: Capillary refill takes less than 2 seconds.  Neurological:     General: No focal deficit present.     Mental Status: She is alert and oriented to person, place, and time.     Cranial Nerves: No cranial nerve deficit.     Sensory: No sensory deficit.     Motor: No weakness.  Psychiatric:        Mood and Affect: Mood normal.        Behavior: Behavior normal.        Thought Content: Thought content normal.        Judgment: Judgment normal.      Assessment And Plan:  Type 2 diabetes mellitus with stage 3b chronic kidney disease, with long-term current use of insulin (HCC) Assessment & Plan: Chronic, HgbA1c decreased to 7.5 at last visit. Continue Trulicity, Lantus and Jardiance. Tolerating medications well.    Orders: -     Basic metabolic panel  Acquired hypothyroidism Assessment & Plan: Chronic, continue current medications. Will make changes to medications pending results.   Orders: -     Basic metabolic panel -     TSH + free T4  Benign hypertension with CKD (chronic kidney disease) stage III (HCC) Assessment & Plan: Chronic, Blood pressure is well controlled, continue current medications. Will check eGFR for kidney function.   Orders: -     Basic metabolic panel  Dyslipidemia Assessment & Plan: Chronic, stable. Continue statin, tolerating well.   Orders: -     Basic metabolic panel -     Lipid panel  Acute  pain of right knee Assessment & Plan: Tenderness to right lateral knee with crepitus, negative swelling. Encouraged to rest her knee for a few days to weeks and consider  walking less miles at a time or every other day walking and to consider having rest days.    Heart burn Assessment & Plan: Intermittent episodes, no true triggers. Will treat with omeprazole for 2 weeks to see if any improvement.   Orders: -     Hemoglobin A1c -     Omeprazole; Take 1 tablet by mouth daily x 2 weeks  Dispense: 14 capsule; Refill: 0  COVID-19 vaccination declined Assessment & Plan: Declines covid 19 vaccine. Discussed risk of covid 105 and if she changes her mind about the vaccine to call the office. Education has been provided regarding the importance of this vaccine but patient still declined. Advised may receive this vaccine at local pharmacy or Health Dept.or vaccine clinic. Aware to provide a copy of the vaccination record if obtained from local pharmacy or Health Dept.  Encouraged to take multivitamin, vitamin d, vitamin c and zinc to increase immune system. Aware can call office if would like to have vaccine here at office. Verbalized acceptance and understanding.    BMI 25.0-25.9,adult    Return for controlled DM check 4 months.  Patient was given opportunity to ask questions. Patient verbalized understanding of the plan and was able to repeat key elements of the plan. All questions were answered to their satisfaction.    Jeanell Sparrow, FNP, have reviewed all documentation for this visit. The documentation on 11/11/23 for the exam, diagnosis, procedures, and orders are all accurate and complete.   IF YOU HAVE BEEN REFERRED TO A SPECIALIST, IT MAY TAKE 1-2 WEEKS TO SCHEDULE/PROCESS THE REFERRAL. IF YOU HAVE NOT HEARD FROM US/SPECIALIST IN TWO WEEKS, PLEASE GIVE [place] A CALL AT [phone].

## 2023-11-11 NOTE — Assessment & Plan Note (Signed)
 Tenderness to right lateral knee with crepitus, negative swelling. Encouraged to rest her knee for a few days to weeks and consider walking less miles at a time or every other day walking and to consider having rest days.

## 2023-11-11 NOTE — Assessment & Plan Note (Signed)
 Intermittent episodes, no true triggers. Will treat with omeprazole for 2 weeks to see if any improvement.

## 2023-11-11 NOTE — Assessment & Plan Note (Signed)
Chronic, continue current medications. Will make changes to medications pending results.

## 2023-11-11 NOTE — Assessment & Plan Note (Signed)
 Chronic, HgbA1c decreased to 7.5 at last visit. Continue Trulicity, Lantus and Jardiance. Tolerating medications well.

## 2023-11-11 NOTE — Assessment & Plan Note (Signed)
Chronic, Blood pressure is well controlled, continue current medications. Will check eGFR for kidney function

## 2023-11-12 LAB — LIPID PANEL
Chol/HDL Ratio: 2.2 ratio (ref 0.0–4.4)
Cholesterol, Total: 112 mg/dL (ref 100–199)
HDL: 50 mg/dL (ref >39)
LDL Chol Calc (NIH): 46 mg/dL (ref 0–99)
Triglycerides: 79 mg/dL (ref 0–149)
VLDL Cholesterol Cal: 16 mg/dL (ref 5–40)

## 2023-11-12 LAB — BASIC METABOLIC PANEL
BUN/Creatinine Ratio: 10 — ABNORMAL LOW (ref 12–28)
BUN: 13 mg/dL (ref 8–27)
CO2: 22 mmol/L (ref 20–29)
Calcium: 9.8 mg/dL (ref 8.7–10.3)
Chloride: 103 mmol/L (ref 96–106)
Creatinine, Ser: 1.26 mg/dL — ABNORMAL HIGH (ref 0.57–1.00)
Glucose: 60 mg/dL — ABNORMAL LOW (ref 70–99)
Potassium: 4.4 mmol/L (ref 3.5–5.2)
Sodium: 143 mmol/L (ref 134–144)
eGFR: 42 mL/min/{1.73_m2} — ABNORMAL LOW (ref >59)

## 2023-11-12 LAB — TSH+FREE T4
Free T4: 1.36 ng/dL (ref 0.82–1.77)
TSH: 2.2 u[IU]/mL (ref 0.450–4.500)

## 2023-11-12 LAB — HEMOGLOBIN A1C
Est. average glucose Bld gHb Est-mCnc: 154 mg/dL
Hgb A1c MFr Bld: 7 % — ABNORMAL HIGH (ref 4.8–5.6)

## 2023-11-13 ENCOUNTER — Other Ambulatory Visit: Payer: Self-pay | Admitting: Nurse Practitioner

## 2023-11-21 ENCOUNTER — Other Ambulatory Visit: Payer: Self-pay | Admitting: Cardiovascular Disease

## 2023-11-24 ENCOUNTER — Other Ambulatory Visit: Payer: Self-pay | Admitting: Nurse Practitioner

## 2023-11-24 DIAGNOSIS — R12 Heartburn: Secondary | ICD-10-CM

## 2023-11-24 MED ORDER — OMEPRAZOLE 20 MG PO CPDR: DELAYED_RELEASE_CAPSULE | ORAL | 1 refills | Status: DC

## 2023-11-24 NOTE — Telephone Encounter (Signed)
 Called and LVM for patient to call back triage regarding prescription refill request. Omeprazole was written for 2 weeks only as it was an acute symptom addressed at her last office visit on 11/11/23 with PCP.

## 2023-11-24 NOTE — Telephone Encounter (Signed)
 Copied from CRM [id number]. Topic: Clinical - Medication Refill >> Nov 24, 2023  9:07 AM Higinio Roger wrote: Most Recent Primary Care Visit:  Provider: Arnette Felts  Department: [facility]  Visit Type: OFFICE VISIT  Date: 11/11/2023  Medication: omeprazole (PRILOSEC) 20 MG capsule   Has the patient contacted their pharmacy? No (Agent: If no, request that the patient contact the pharmacy for the refill. If patient does not wish to contact the pharmacy document the reason why and proceed with request.) (Agent: If yes, when and what did the pharmacy advise?)  Is this the correct pharmacy for this prescription? Yes If no, delete pharmacy and type the correct one.  This is the patient's preferred pharmacy:  [facility] [address] [place], [place] - [address] [place] [address] Phone: [phone] Fax: [phone]  Has the prescription been filled recently? Yes  Is the patient out of the medication? Yes  Has the patient been seen for an appointment in the last year OR does the patient have an upcoming appointment? Yes  Can we respond through MyChart? Yes  Agent: Please be advised that Rx refills may take up to 3 business days. We ask that you follow-up with your pharmacy.

## 2023-12-02 ENCOUNTER — Encounter: Payer: Self-pay | Admitting: Nurse Practitioner

## 2023-12-09 ENCOUNTER — Other Ambulatory Visit: Payer: Self-pay | Admitting: Nurse Practitioner

## 2023-12-11 ENCOUNTER — Other Ambulatory Visit: Payer: Self-pay | Admitting: Nurse Practitioner

## 2023-12-14 DIAGNOSIS — H35033 Hypertensive retinopathy, bilateral: Secondary | ICD-10-CM | POA: Diagnosis not present

## 2023-12-14 DIAGNOSIS — H43813 Vitreous degeneration, bilateral: Secondary | ICD-10-CM | POA: Diagnosis not present

## 2023-12-14 DIAGNOSIS — H40023 Open angle with borderline findings, high risk, bilateral: Secondary | ICD-10-CM | POA: Diagnosis not present

## 2023-12-14 DIAGNOSIS — H2511 Age-related nuclear cataract, right eye: Secondary | ICD-10-CM | POA: Diagnosis not present

## 2023-12-14 DIAGNOSIS — E119 Type 2 diabetes mellitus without complications: Secondary | ICD-10-CM | POA: Diagnosis not present

## 2023-12-22 DIAGNOSIS — N1831 Chronic kidney disease, stage 3a: Secondary | ICD-10-CM | POA: Diagnosis not present

## 2023-12-22 DIAGNOSIS — I129 Hypertensive chronic kidney disease with stage 1 through stage 4 chronic kidney disease, or unspecified chronic kidney disease: Secondary | ICD-10-CM | POA: Diagnosis not present

## 2023-12-22 DIAGNOSIS — E559 Vitamin D deficiency, unspecified: Secondary | ICD-10-CM | POA: Diagnosis not present

## 2023-12-22 DIAGNOSIS — D479 Neoplasm of uncertain behavior of lymphoid, hematopoietic and related tissue, unspecified: Secondary | ICD-10-CM | POA: Diagnosis not present

## 2023-12-22 DIAGNOSIS — E1122 Type 2 diabetes mellitus with diabetic chronic kidney disease: Secondary | ICD-10-CM | POA: Diagnosis not present

## 2023-12-22 DIAGNOSIS — D631 Anemia in chronic kidney disease: Secondary | ICD-10-CM | POA: Diagnosis not present

## 2023-12-22 DIAGNOSIS — N183 Chronic kidney disease, stage 3 unspecified: Secondary | ICD-10-CM | POA: Diagnosis not present

## 2023-12-23 LAB — LAB REPORT - SCANNED
Albumin, Urine POC: 11.6
Albumin/Creatinine Ratio, Urine, POC: 35
Creatinine, POC: 33.1 mg/dL

## 2023-12-24 ENCOUNTER — Other Ambulatory Visit: Payer: Self-pay | Admitting: Nurse Practitioner

## 2023-12-28 ENCOUNTER — Encounter: Payer: Self-pay | Admitting: Podiatry

## 2023-12-28 ENCOUNTER — Ambulatory Visit (INDEPENDENT_AMBULATORY_CARE_PROVIDER_SITE_OTHER): Payer: 59 | Admitting: Podiatry

## 2023-12-28 VITALS — Ht 63.0 in | Wt 146.8 lb

## 2023-12-28 DIAGNOSIS — B351 Tinea unguium: Secondary | ICD-10-CM | POA: Diagnosis not present

## 2023-12-28 DIAGNOSIS — M79674 Pain in right toe(s): Secondary | ICD-10-CM

## 2023-12-28 DIAGNOSIS — M2142 Flat foot [pes planus] (acquired), left foot: Secondary | ICD-10-CM | POA: Diagnosis not present

## 2023-12-28 DIAGNOSIS — M2041 Other hammer toe(s) (acquired), right foot: Secondary | ICD-10-CM

## 2023-12-28 DIAGNOSIS — M79675 Pain in left toe(s): Secondary | ICD-10-CM | POA: Diagnosis not present

## 2023-12-28 DIAGNOSIS — M2042 Other hammer toe(s) (acquired), left foot: Secondary | ICD-10-CM | POA: Diagnosis not present

## 2023-12-28 DIAGNOSIS — M2141 Flat foot [pes planus] (acquired), right foot: Secondary | ICD-10-CM

## 2023-12-28 DIAGNOSIS — M2012 Hallux valgus (acquired), left foot: Secondary | ICD-10-CM | POA: Diagnosis not present

## 2023-12-28 DIAGNOSIS — E1142 Type 2 diabetes mellitus with diabetic polyneuropathy: Secondary | ICD-10-CM | POA: Diagnosis not present

## 2023-12-28 DIAGNOSIS — E119 Type 2 diabetes mellitus without complications: Secondary | ICD-10-CM | POA: Diagnosis not present

## 2023-12-28 DIAGNOSIS — M2011 Hallux valgus (acquired), right foot: Secondary | ICD-10-CM | POA: Diagnosis not present

## 2023-12-28 NOTE — Patient Instructions (Signed)
 It was my pleasure serving you today! You had your annual diabetic foot examination performed today. You will notice a charge for an office visit on today's billing and this is for your annual diabetic foot exam. It is done once yearly.  Diabetes Mellitus and Foot Care Diabetes, also called diabetes mellitus, may cause problems with your feet and legs because of poor blood flow (circulation). Poor circulation may make your skin: Become thinner and drier. Break more easily. Heal more slowly. Peel and crack. You may also have nerve damage (neuropathy). This can cause decreased feeling in your legs and feet. This means that you may not notice minor injuries to your feet that could lead to more serious problems. Finding and treating problems early is the best way to prevent future foot problems. How to care for your feet Foot hygiene  Wash your feet daily with warm water and mild soap. Do not use hot water. Then, pat your feet and the areas between your toes until they are fully dry. Do not soak your feet. This can dry your skin. Trim your toenails straight across. Do not dig under them or around the cuticle. File the edges of your nails with an emery board or nail file. Apply a moisturizing lotion or petroleum jelly to the skin on your feet and to dry, brittle toenails. Use lotion that does not contain alcohol and is unscented. Do not apply lotion between your toes. Shoes and socks Wear clean socks or stockings every day. Make sure they are not too tight. Do not wear knee-high stockings. These may decrease blood flow to your legs. Wear shoes that fit well and have enough cushioning. Always look in your shoes before you put them on to be sure there are no objects inside. To break in new shoes, wear them for just a few hours a day. This prevents injuries on your feet. Wounds, scrapes, corns, and calluses  Check your feet daily for blisters, cuts, bruises, sores, and redness. If you cannot see the  bottom of your feet, use a mirror or ask someone for help. Do not cut off corns or calluses or try to remove them with medicine. If you find a minor scrape, cut, or break in the skin on your feet, keep it and the skin around it clean and dry. You may clean these areas with mild soap and water. Do not clean the area with peroxide, alcohol, or iodine. If you have a wound, scrape, corn, or callus on your foot, look at it several times a day to make sure it is healing and not infected. Check for: Redness, swelling, or pain. Fluid or blood. Warmth. Pus or a bad smell. General tips Do not cross your legs. This may decrease blood flow to your feet. Do not use heating pads or hot water bottles on your feet. They may burn your skin. If you have lost feeling in your feet or legs, you may not know this is happening until it is too late. Protect your feet from hot and cold by wearing shoes, such as at the beach or on hot pavement. Schedule a complete foot exam at least once a year or more often if you have foot problems. Report any cuts, sores, or bruises to your health care provider right away. Where to find more information American Diabetes Association: diabetes.org Association of Diabetes Care & Education Specialists: diabeteseducator.org Contact a health care provider if: You have a condition that increases your risk of infection, and you  have any cuts, sores, or bruises on your feet. You have an injury that is not healing. You have redness on your legs or feet. You feel burning or tingling in your legs or feet. You have pain or cramps in your legs and feet. Your legs or feet are numb. Your feet always feel cold. You have pain around any toenails. Get help right away if: You have a wound, scrape, corn, or callus on your foot and: You have signs of infection. You have a fever. You have a red line going up your leg. This information is not intended to replace advice given to you by your health  care provider. Make sure you discuss any questions you have with your health care provider. Document Revised: 02/11/2022 Document Reviewed: 02/11/2022 Elsevier Patient Education  2024 [employer].

## 2023-12-28 NOTE — Progress Notes (Signed)
 ANNUAL DIABETIC FOOT EXAM  Subjective: [name] presents today for annual diabetic foot exam. Patient states she has been having right knee pain. Has tried applying rubbing alcohol with avocado seed on her knee. Chief Complaint  Patient presents with   Nail Problem    Pt is here for [facility] unsure of last A1C PCP is Dr Sulema Endo and LOV was in March.   Patient confirms h/o diabetes.  Patient has been diagnosed with neuropathy.  Susanna Epley, FNP is patient's PCP.  Past Medical History:  Diagnosis Date   Arthritis    Asthma    CAD (coronary artery disease)    s/p NSTEMI 10/19 - LHC:  pRCA 99, oLCx 30 >> DES to RCA; Echo 10/19: Mild focal basal septal hypertrophy, EF 60-65, inf-lat, inf and inf-sept HK, trivial MR, trivial TR   Diabetes mellitus without complication (HCC)    DKA (diabetic ketoacidoses) 08/31/2019   Hypertension    Patient Active Problem List   Diagnosis Date Noted   Acute pain of right knee 11/11/2023   Heart burn 11/11/2023   COVID-19 vaccination declined 11/11/2023   Encounter for annual health examination 07/13/2023   Excessive cerumen in both ear canals 07/13/2023   Type 2 diabetes mellitus with stage 3b chronic kidney disease, with long-term current use of insulin  (HCC) 04/06/2023   Benign hypertension with CKD (chronic kidney disease) stage III (HCC) 04/06/2023   Dyslipidemia 04/06/2023   History of breast cancer 11/02/2022   Screening for malignant neoplasm of colon 10/22/2020   Gastroesophageal reflux disease 10/22/2020   Acquired hypothyroidism 03/12/2020   COVID-19 virus infection 08/31/2019   CAD (coronary artery disease) 09/27/2018   Hypertension 06/16/2018   Hyperlipidemia 06/16/2018   Type 2 diabetes mellitus with complication, with long-term current use of insulin  (HCC) 06/16/2018   Hx of NSTEMI 10/19 Tx with DES to pRCA 06/14/2018   Lymphoproliferative disease (HCC) 10/03/2015   Past Surgical History:  Procedure Laterality Date    ABDOMINAL HYSTERECTOMY  "many yrs ago"   AXILLARY LYMPH NODE BIOPSY Right 02/26/2015   Procedure: RIGHT AXILLARY LYMPH NODE BIOPSY;  Surgeon: Adalberto Hollow, MD;  Location: [facility];  Service: General;  Laterality: Right;   CHOLECYSTECTOMY     CORONARY STENT INTERVENTION N/A 06/15/2018   Procedure: CORONARY STENT INTERVENTION;  Surgeon: Wenona Hamilton, MD;  Location: [facility] INVASIVE [facility];  Service: Cardiovascular;  Laterality: N/A;   LEFT HEART CATH AND CORONARY ANGIOGRAPHY N/A 06/15/2018   Procedure: LEFT HEART CATH AND CORONARY ANGIOGRAPHY;  Surgeon: Wenona Hamilton, MD;  Location: [facility] CV [facility];  Service: Cardiovascular;  Laterality: N/A;   Current Outpatient Medications on File Prior to Visit  Medication Sig Dispense Refill   Accu-Chek FastClix Lancets MISC USE AS DIRECTED FOR  DAILY  TESTING 102 each 0   ACCU-CHEK GUIDE TEST test strip USE AS DIRECTED TO CHECK BLOOD SUGAR DAILY 100 each 0   acetaminophen  (TYLENOL ) 325 MG tablet Take by mouth every 6 (six) hours as needed for mild pain.     amLODipine -olmesartan  (AZOR ) 5-20 MG tablet TAKE 1 TABLET BY MOUTH ONCE DAILY IN THE MORNING 90 tablet 1   ANORO ELLIPTA  62.5-25 MCG/ACT AEPB Inhale 1 puff by mouth once daily 60 each 0   aspirin  EC 81 MG tablet Take 1 tablet (81 mg total) by mouth daily. Swallow whole. 90 tablet 3   Cholecalciferol  (VITAMIN D3) 10 MCG (400 UNIT) tablet Take 400 Units by mouth daily. Per patient taking 2000 iu  empagliflozin  (JARDIANCE ) 25 MG TABS tablet TAKE 1 TABLET BY MOUTH ONCE DAILY BEFORE BREAKFAST 90 tablet 1   Ergocalciferol  (VITAMIN D2 PO)      Insulin  Pen Needle (PEN NEEDLES 29GX1/2") 29G X MISC      LANTUS  SOLOSTAR 100 UNIT/ML Solostar Pen INJECT 32 UNITS SUBCUTANEOUSLY ONCE DAILY 15 mL 0   levothyroxine  (SYNTHROID ) 50 MCG tablet Take 1 tablet by mouth once daily 90 tablet 0   meclizine  (ANTIVERT ) 25 MG tablet TAKE 1 TABLET BY MOUTH ONCE DAILY AS NEEDED FOR DIZZINESS 30 tablet 0   metoprolol   succinate (TOPROL -XL) 25 MG 24 hr tablet Take 1 tablet (25 mg total) by mouth daily. 90 tablet 3   NAPROXEN PO      nitroGLYCERIN  (NITROSTAT ) 0.4 MG SL tablet Place 1 tablet (0.4 mg total) under the tongue every 5 (five) minutes x 3 doses as needed for chest pain. 25 tablet 7   omeprazole  (PRILOSEC) 20 MG capsule Take 1 tablet by mouth daily 30 capsule 1   RELION PEN NEEDLE 31G/8MM 31G X 8 MM MISC USE AS DIRECTED 100 each 0   rosuvastatin  (CRESTOR ) 20 MG tablet Take 1 tablet by mouth once daily 90 tablet 3   TRULICITY  4.5 MG/0.5ML SOPN INJECT 4.5 MG INTO THE SKIN AS DIRECTED ONCE A WEEK 24 mL 0   XIIDRA  5 % SOLN Place 2 drops into both eyes 2 (two) times daily.      No current facility-administered medications on file prior to visit.    No Known Allergies Social History   Occupational History   Occupation: retired  Tobacco Use   Smoking status: Former    Current packs/day: 0.50    Average packs/day: 0.5 packs/day for 60.0 years (30.0 ttl pk-yrs)    Types: Cigarettes   Smokeless tobacco: Never  Vaping Use   Vaping status: Never Used  Substance and Sexual Activity   Alcohol use: No   Drug use: No   Sexual activity: Not Currently   Family History  Problem Relation Age of Onset   Diabetes Mother    Heart disease Mother    Diabetes Father    Diabetes Sister    Heart disease Sister    Diabetes Brother    Immunization History  Administered Date(s) Administered   Hepatitis A, Adult 04/25/2022, 10/18/2022   Hepatitis B, PED/ADOLESCENT 04/25/2022, 10/18/2022   Influenza-Unspecified 06/24/2018, 04/22/2020, 04/25/2022   PFIZER Comirnaty(Spindel Top)Covid-19 Tri-Sucrose Vaccine 12/11/2020   PFIZER(Purple Top)SARS-COV-2 Vaccination 10/17/2019, 11/07/2019, 06/03/2020, 06/02/2021   PNEUMOCOCCAL CONJUGATE-20 11/14/2020   Pfizer Covid-19 Vaccine Bivalent Booster 43yrs & up 05/31/2022   Pneumococcal Conjugate-13 04/11/2014   Pneumococcal Polysaccharide-23 05/16/2015   Respiratory Syncytial  Virus Vaccine,Recomb Aduvanted(Arexvy) 05/02/2022   Tdap 09/13/2020   Zoster Recombinant(Shingrix) 05/04/2017, 07/04/2017   Zoster, Live 04/11/2014     Review of Systems: Negative except as noted in the HPI.   Objective: There were no vitals filed for this visit.  Rainie Pantaleo is a pleasant 89 y.o. female in NAD. AAO X 3.  Diabetic foot exam was performed with the following findings:   Vascular Examination: Capillary refill time immediate b/l. Vascular status intact b/l with palpable pedal pulses. Pedal hair present b/l. No pain with calf compression b/l. Skin temperature gradient WNL b/l. No cyanosis or clubbing b/l. No ischemia or gangrene noted b/l.   Neurological Examination: Sensation grossly intact b/l with 10 gram monofilament. Vibratory sensation intact b/l. Pt has subjective symptoms of neuropathy.  Dermatological Examination: Pedal integument with  normal turgor, texture and tone b/l LE. No open wounds b/l. No interdigital macerations b/l. Toenails 1-5 b/l elongated, thickened, discolored with subungual debris. +Tenderness with dorsal palpation of nailplates. No hyperkeratotic or porokeratotic lesions present.  Musculoskeletal Examination: Muscle strength 5/5 to all lower extremity muscle groups bilaterally. HAV with bunion deformity noted b/l LE. Hammertoe deformity noted 2-5 b/l. Pes planus deformity noted bilateral LE. Patient ambulates independent of any assistive aids.  Radiographs: None     Lab Results  Component Value Date   HGBA1C 7.0 (H) 11/11/2023   ADA Risk Categorization: Low Risk :  Patient has all of the following: Intact protective sensation No prior foot ulcer  No severe deformity Pedal pulses present  Assessment: 1. Pain due to onychomycosis of toenails of both feet   2. Hallux valgus, acquired, bilateral   3. Acquired hammertoes of both feet   4. Pes planus of both feet   5. Diabetic peripheral neuropathy associated with type 2 diabetes  mellitus (HCC)   6. Encounter for diabetic foot exam (HCC)      Plan: Discussed her right foot pain. She declines further work up on today. Recommended change in shoe gear to [employer] 800 series or higher at [employer]. Diabetic foot examination performed today. All patient's and/or POA's questions/concerns addressed on today's visit. Toenails 1-5 debrided in length and girth without incident. Continue foot and shoe inspections daily. Monitor blood glucose per PCP/Endocrinologist's recommendations. Continue soft, supportive shoe gear daily. Report any pedal injuries to medical professional. Call office if there are any questions/concerns. -Patient/POA to call should there be question/concern in the interim. Return in about 3 months (around 03/29/2024).  [name], DPM      [place] LOCATION: [address], [place] [address]                   Office [phone]   [facility] LOCATION: [address] [place], [place] [address] Office [phone]

## 2024-01-04 ENCOUNTER — Other Ambulatory Visit: Payer: Self-pay | Admitting: Nurse Practitioner

## 2024-01-08 ENCOUNTER — Other Ambulatory Visit: Payer: Self-pay | Admitting: Nurse Practitioner

## 2024-01-23 ENCOUNTER — Other Ambulatory Visit: Payer: Self-pay | Admitting: Nurse Practitioner

## 2024-01-31 ENCOUNTER — Other Ambulatory Visit: Payer: Self-pay

## 2024-01-31 MED ORDER — LANTUS SOLOSTAR 100 UNIT/ML ~~LOC~~ SOPN: 32.0000 [IU] | PEN_INJECTOR | Freq: Every day | SUBCUTANEOUS | 0 refills | Status: DC

## 2024-02-01 ENCOUNTER — Other Ambulatory Visit: Payer: Self-pay | Admitting: Nurse Practitioner

## 2024-02-05 ENCOUNTER — Other Ambulatory Visit: Payer: Self-pay | Admitting: Nurse Practitioner

## 2024-02-06 ENCOUNTER — Other Ambulatory Visit: Payer: Self-pay | Admitting: Nurse Practitioner

## 2024-02-06 DIAGNOSIS — R12 Heartburn: Secondary | ICD-10-CM

## 2024-02-11 ENCOUNTER — Other Ambulatory Visit: Payer: Self-pay

## 2024-02-11 MED ORDER — ACCU-CHEK FASTCLIX LANCETS MISC
0 refills | Status: AC
Start: 2024-02-11 — End: ?

## 2024-02-22 ENCOUNTER — Other Ambulatory Visit: Payer: Self-pay | Admitting: Nurse Practitioner

## 2024-02-22 DIAGNOSIS — Z794 Long term (current) use of insulin: Secondary | ICD-10-CM

## 2024-02-28 ENCOUNTER — Other Ambulatory Visit: Payer: Self-pay | Admitting: Nurse Practitioner

## 2024-03-02 ENCOUNTER — Other Ambulatory Visit: Payer: Self-pay | Admitting: Nurse Practitioner

## 2024-03-03 ENCOUNTER — Other Ambulatory Visit: Payer: Self-pay | Admitting: Nurse Practitioner

## 2024-03-15 ENCOUNTER — Ambulatory Visit (INDEPENDENT_AMBULATORY_CARE_PROVIDER_SITE_OTHER): Admitting: Nurse Practitioner

## 2024-03-15 ENCOUNTER — Encounter: Payer: Self-pay | Admitting: Nurse Practitioner

## 2024-03-15 VITALS — BP 120/70 | HR 72 | Temp 97.7°F | Ht 63.0 in | Wt 149.2 lb

## 2024-03-15 DIAGNOSIS — E785 Hyperlipidemia, unspecified: Secondary | ICD-10-CM | POA: Diagnosis not present

## 2024-03-15 DIAGNOSIS — Z794 Long term (current) use of insulin: Secondary | ICD-10-CM

## 2024-03-15 DIAGNOSIS — E1122 Type 2 diabetes mellitus with diabetic chronic kidney disease: Secondary | ICD-10-CM

## 2024-03-15 DIAGNOSIS — E663 Overweight: Secondary | ICD-10-CM

## 2024-03-15 DIAGNOSIS — I129 Hypertensive chronic kidney disease with stage 1 through stage 4 chronic kidney disease, or unspecified chronic kidney disease: Secondary | ICD-10-CM

## 2024-03-15 DIAGNOSIS — E039 Hypothyroidism, unspecified: Secondary | ICD-10-CM | POA: Diagnosis not present

## 2024-03-15 DIAGNOSIS — N1832 Chronic kidney disease, stage 3b: Secondary | ICD-10-CM

## 2024-03-15 DIAGNOSIS — N183 Chronic kidney disease, stage 3 unspecified: Secondary | ICD-10-CM

## 2024-03-15 NOTE — Assessment & Plan Note (Signed)
 Chronic, stable, continue current medications

## 2024-03-15 NOTE — Progress Notes (Addendum)
 LILLETTE Kristeen JINNY Gladis, CMA,acting as a Neurosurgeon for Gaines Ada, FNP.,have documented all relevant documentation on the behalf of Gaines Ada, FNP,as directed by  Gaines Ada, FNP while in the presence of Gaines Ada, FNP.  Subjective:  Patient ID: [name] , female    DOB: July 01, 1935 , 89 y.o.   MRN: [id number]  Chief Complaint  Patient presents with   Hypertension    Patient presents today for a bp and dm follow up, Patient reports compliance with medication. Patient denies any chest pain, SOB, or headaches. Patient has no concerns today.     HPI  Patient presents today for a bp and dm follow up, Patient reports compliance with medication. Patient denies any chest pain, SOB, or headaches. Patient reports right knee pain, she has been walking a lot at least 3 miles per day, reports her knee is feeling better since she has been resting it, she has taken tylenol . Patient reports she has really bad heart burn, first started 3 weeks ago.   Heartburn She complains of heartburn. She reports no chest pain or no wheezing. This is a new problem. The current episode started 1 to 4 weeks ago. The problem has been gradually worsening. The heartburn does not wake her from sleep. The heartburn does not limit her activity. The heartburn doesn't change with position. The symptoms are aggravated by lying down. Pertinent negatives include no fatigue. She has tried nothing for the symptoms. Past procedures do not include an abdominal ultrasound.  Diabetes She presents for her follow-up diabetic visit. She has type 2 diabetes mellitus. Her disease course has been worsening. There are no hypoglycemic associated symptoms. Pertinent negatives for hypoglycemia include no headaches. Pertinent negatives for diabetes include no chest pain, no fatigue, no polydipsia, no polyphagia and no polyuria. There are no hypoglycemic complications. Symptoms are improving. Diabetic complications include nephropathy. Risk factors  for coronary artery disease include diabetes mellitus and sedentary lifestyle. Current diabetic treatment includes oral agent (dual therapy). She is compliant with treatment all of the time. She is following a generally healthy diet. When asked about meal planning, she reported none. She participates in exercise daily. There is no change in her home blood glucose trend. (Reports blood sugars averaging 100's. ) She sees a podiatrist [facility]).Eye exam is current.  Hypertension This is a chronic problem. The current episode started more than 1 year ago. The problem has been gradually improving since onset. The problem is controlled. Pertinent negatives include no anxiety, chest pain, headaches, palpitations or shortness of breath. There are no associated agents to hypertension. Risk factors for coronary artery disease include diabetes mellitus and dyslipidemia. Past treatments include angiotensin blockers, calcium  channel blockers and beta blockers. There are no compliance problems.  Hypertensive end-organ damage includes kidney disease and CAD/MI. There is no history of angina. Identifiable causes of hypertension include chronic renal disease and a thyroid  problem.     Past Medical History:  Diagnosis Date   Arthritis    Asthma    CAD (coronary artery disease)    s/p NSTEMI 10/19 - LHC:  pRCA 99, oLCx 30 >> DES to RCA; Echo 10/19: Mild focal basal septal hypertrophy, EF 60-65, inf-lat, inf and inf-sept HK, trivial MR, trivial TR   Diabetes mellitus without complication (HCC)    DKA (diabetic ketoacidoses) 08/31/2019   Hypertension      Family History  Problem Relation Age of Onset   Diabetes Mother    Heart disease Mother    Diabetes Father  Diabetes Sister    Heart disease Sister    Diabetes Brother      Current Outpatient Medications:    Accu-Chek FastClix Lancets MISC, USE AS DIRECTED FOR DAILY TESTING, Disp: 102 each, Rfl: 0   ACCU-CHEK GUIDE TEST test strip, USE AS DIRECTED ONCE  DAILY TO  CHECK  BLOOD  SUGAR, Disp: 100 each, Rfl: 0   acetaminophen  (TYLENOL ) 325 MG tablet, Take by mouth every 6 (six) hours as needed for mild pain., Disp: , Rfl:    amLODipine -olmesartan  (AZOR ) 5-20 MG tablet, TAKE 1 TABLET BY MOUTH ONCE DAILY IN THE MORNING, Disp: 90 tablet, Rfl: 0   aspirin  EC 81 MG tablet, Take 1 tablet (81 mg total) by mouth daily. Swallow whole., Disp: 90 tablet, Rfl: 3   Cholecalciferol  (VITAMIN D3) 10 MCG (400 UNIT) tablet, Take 400 Units by mouth daily. Per patient taking 2000 iu, Disp: , Rfl:    Ergocalciferol  (VITAMIN D2 PO), , Disp: , Rfl:    Insulin  Pen Needle (PEN NEEDLES 29GX1/2) 29G X MISC, , Disp: , Rfl:    JARDIANCE  25 MG TABS tablet, TAKE 1 TABLET BY MOUTH ONCE DAILY BEFORE BREAKFAST, Disp: 90 tablet, Rfl: 0   LANTUS  SOLOSTAR 100 UNIT/ML Solostar Pen, Inject 32 Units into the skin daily., Disp: 15 mL, Rfl: 0   levothyroxine  (SYNTHROID ) 50 MCG tablet, Take 1 tablet by mouth once daily, Disp: 90 tablet, Rfl: 0   meclizine  (ANTIVERT ) 25 MG tablet, TAKE 1 TABLET BY MOUTH ONCE DAILY AS NEEDED FOR DIZZINESS, Disp: 30 tablet, Rfl: 0   metoprolol  succinate (TOPROL -XL) 25 MG 24 hr tablet, Take 1 tablet (25 mg total) by mouth daily., Disp: 90 tablet, Rfl: 3   NAPROXEN PO, , Disp: , Rfl:    nitroGLYCERIN  (NITROSTAT ) 0.4 MG SL tablet, Place 1 tablet (0.4 mg total) under the tongue every 5 (five) minutes x 3 doses as needed for chest pain., Disp: 25 tablet, Rfl: 7   omeprazole  (PRILOSEC) 20 MG capsule, Take 1 capsule by mouth once daily, Disp: 30 capsule, Rfl: 0   RELION PEN NEEDLE 31G/8MM 31G X 8 MM MISC, USE AS DIRECTED ONCE DAILY, Disp: 100 each, Rfl: 0   rosuvastatin  (CRESTOR ) 20 MG tablet, Take 1 tablet by mouth once daily, Disp: 90 tablet, Rfl: 3   TRULICITY  4.5 MG/0.5ML SOPN, INJECT 4.5 MG INTO THE SKIN AS DIRECTED ONCE A WEEK, Disp: 24 mL, Rfl: 0   umeclidinium-vilanterol (ANORO ELLIPTA ) 62.5-25 MCG/ACT AEPB, Inhale 1 puff by mouth once daily, Disp: 60  each, Rfl: 0   XIIDRA  5 % SOLN, Place 2 drops into both eyes 2 (two) times daily. , Disp: , Rfl:    No Known Allergies   Review of Systems  Constitutional:  Negative for fatigue.  Respiratory:  Negative for shortness of breath and wheezing.   Cardiovascular:  Negative for chest pain and palpitations.  Gastrointestinal:  Positive for heartburn.  Endocrine: Negative for polydipsia, polyphagia and polyuria.  Neurological:  Negative for headaches.     Today's Vitals   03/15/24 0829  BP: 120/70  Pulse: 72  Temp: 97.7 F (36.5 C)  TempSrc: Oral  Weight: 149 lb 3.2 oz (67.7 kg)  Height: 5' 3 (1.6 m)  PainSc: 0-No pain   Body mass index is 26.43 kg/m.  Wt Readings from Last 3 Encounters:  03/15/24 149 lb 3.2 oz (67.7 kg)  12/28/23 146 lb 12.8 oz (66.6 kg)  11/11/23 146 lb 12.8 oz (66.6 kg)  Objective:  Physical Exam Vitals and nursing note reviewed.  Constitutional:      General: She is not in acute distress.    Appearance: Normal appearance. She is well-developed.  HENT:     Right Ear: Hearing normal.     Left Ear: Hearing normal.  Eyes:     General: Lids are normal.     Extraocular Movements: Extraocular movements intact.     Pupils: Pupils are equal, round, and reactive to light.     Funduscopic exam:    Right eye: No papilledema.        Left eye: No papilledema.  Neck:     Thyroid : No thyroid  mass.  Cardiovascular:     Rate and Rhythm: Normal rate and regular rhythm.     Pulses: Normal pulses.     Heart sounds: Normal heart sounds. No murmur heard. Pulmonary:     Effort: Pulmonary effort is normal. No respiratory distress.     Breath sounds: Normal breath sounds. No wheezing.  Chest:     Chest wall: No mass.  Breasts:    Right: Normal. No mass or tenderness.     Left: Normal. No mass or tenderness.  Musculoskeletal:     Cervical back: Full passive range of motion without pain.  Lymphadenopathy:     Upper Body:     Right upper body: No  supraclavicular, axillary or pectoral adenopathy.     Left upper body: No supraclavicular, axillary or pectoral adenopathy.  Skin:    General: Skin is warm and dry.     Capillary Refill: Capillary refill takes less than 2 seconds.  Neurological:     General: No focal deficit present.     Mental Status: She is alert and oriented to person, place, and time.     Cranial Nerves: No cranial nerve deficit.     Sensory: No sensory deficit.     Motor: No weakness.  Psychiatric:        Mood and Affect: Mood normal.        Behavior: Behavior normal.        Thought Content: Thought content normal.        Judgment: Judgment normal.      Assessment And Plan:  Type 2 diabetes mellitus with stage 3b chronic kidney disease, with long-term current use of insulin  (HCC) Assessment & Plan: Chronic, HgbA1c decreased to 7.0 at last visit. Congratulated on improvement. Continue Trulicity , Lantus  and Jardiance . Tolerating medications well.    Orders: -     Hemoglobin A1c  Acquired hypothyroidism Assessment & Plan: Chronic, stable, continue current medications.  Orders: -     TSH + free T4  Benign hypertension with CKD (chronic kidney disease) stage III (HCC) Assessment & Plan: Chronic, Blood pressure is well controlled, continue current medications. Will check eGFR for kidney function.   Orders: -     BMP8+eGFR  Dyslipidemia Assessment & Plan: Chronic, stable. Continue statin, tolerating well.   Orders: -     Lipid panel  Overweight (BMI 25.0-29.9)    Return for controlled DM check 4 months on same day as AWV.  Patient was given opportunity to ask questions. Patient verbalized understanding of the plan and was able to repeat key elements of the plan. All questions were answered to their satisfaction.    LILLETTE Gaines Ada, FNP, have reviewed all documentation for this visit. The documentation on 03/15/24 for the exam, diagnosis, procedures, and orders are all accurate and complete.    IF  YOU HAVE BEEN REFERRED TO A SPECIALIST, IT MAY TAKE 1-2 WEEKS TO SCHEDULE/PROCESS THE REFERRAL. IF YOU HAVE NOT HEARD FROM US /SPECIALIST IN TWO WEEKS, PLEASE GIVE US  A CALL AT [phone].

## 2024-03-15 NOTE — Assessment & Plan Note (Signed)
Chronic, Blood pressure is well controlled, continue current medications. Will check eGFR for kidney function

## 2024-03-15 NOTE — Assessment & Plan Note (Signed)
 Chronic, HgbA1c decreased to 7.0 at last visit. Congratulated on improvement. Continue Trulicity , Lantus  and Jardiance . Tolerating medications well.

## 2024-03-15 NOTE — Assessment & Plan Note (Signed)
Chronic, stable. Continue statin, tolerating well.

## 2024-03-16 LAB — LIPID PANEL
Chol/HDL Ratio: 2.5 ratio (ref 0.0–4.4)
Cholesterol, Total: 129 mg/dL (ref 100–199)
HDL: 51 mg/dL (ref >39)
LDL Chol Calc (NIH): 58 mg/dL (ref 0–99)
Triglycerides: 111 mg/dL (ref 0–149)
VLDL Cholesterol Cal: 20 mg/dL (ref 5–40)

## 2024-03-16 LAB — TSH+FREE T4
Free T4: 1.23 ng/dL (ref 0.82–1.77)
TSH: 2.13 u[IU]/mL (ref 0.450–4.500)

## 2024-03-16 LAB — BMP8+EGFR
BUN/Creatinine Ratio: 9 — ABNORMAL LOW (ref 12–28)
BUN: 10 mg/dL (ref 8–27)
CO2: 23 mmol/L (ref 20–29)
Calcium: 9.7 mg/dL (ref 8.7–10.3)
Chloride: 100 mmol/L (ref 96–106)
Creatinine, Ser: 1.15 mg/dL — ABNORMAL HIGH (ref 0.57–1.00)
Glucose: 117 mg/dL — ABNORMAL HIGH (ref 70–99)
Potassium: 4.4 mmol/L (ref 3.5–5.2)
Sodium: 139 mmol/L (ref 134–144)
eGFR: 46 mL/min/1.73 — ABNORMAL LOW (ref >59)

## 2024-03-16 LAB — HEMOGLOBIN A1C
Est. average glucose Bld gHb Est-mCnc: 169 mg/dL
Hgb A1c MFr Bld: 7.5 % — ABNORMAL HIGH (ref 4.8–5.6)

## 2024-03-18 ENCOUNTER — Other Ambulatory Visit: Payer: Self-pay | Admitting: Nurse Practitioner

## 2024-04-04 ENCOUNTER — Ambulatory Visit: Admitting: Podiatry

## 2024-04-04 ENCOUNTER — Encounter: Payer: Self-pay | Admitting: Podiatry

## 2024-04-04 ENCOUNTER — Other Ambulatory Visit: Payer: Self-pay | Admitting: Nurse Practitioner

## 2024-04-04 DIAGNOSIS — M79674 Pain in right toe(s): Secondary | ICD-10-CM

## 2024-04-04 DIAGNOSIS — B351 Tinea unguium: Secondary | ICD-10-CM | POA: Diagnosis not present

## 2024-04-04 DIAGNOSIS — E1142 Type 2 diabetes mellitus with diabetic polyneuropathy: Secondary | ICD-10-CM

## 2024-04-04 DIAGNOSIS — M79675 Pain in left toe(s): Secondary | ICD-10-CM | POA: Diagnosis not present

## 2024-04-04 NOTE — Progress Notes (Signed)
  Subjective:  Patient ID: [name], female    DOB: [birth year],  MRN: [id number]  89 y.o. female presents at risk foot care with history of diabetic neuropathy and painful thick toenails that are difficult to trim. Pain interferes with ambulation. Aggravating factors include wearing enclosed shoe gear. Pain is relieved with periodic professional debridement.  Chief Complaint  Patient presents with   DFc    Rm16 Diabetic foot care/ J. Moore last visit July 2025/A1c 7.5    New problem(s): None   PCP is Georgina Speaks, FNP.  No Known Allergies  Review of Systems: Negative except as noted in the HPI.   Objective:  [name] is a pleasant 89 y.o. female WD, WN in NAD. AAO x 3.  Vascular Examination: Vascular status intact b/l with palpable pedal pulses. CFT immediate b/l. Pedal hair present. No edema. No pain with calf compression b/l. Skin temperature gradient WNL b/l. No varicosities noted. No cyanosis or clubbing noted.  Neurological Examination: Sensation grossly intact b/l with 10 gram monofilament. Vibratory sensation intact b/l.  Dermatological Examination: Pedal skin with normal turgor, texture and tone b/l. No open wounds nor interdigital macerations noted. Toenails 1-5 b/l thick, discolored, elongated with subungual debris and pain on dorsal palpation. No hyperkeratotic lesions noted b/l.   Musculoskeletal Examination: Muscle strength 5/5 to all lower extremity muscle groups bilaterally. HAV with bunion bilaterally and hammertoes 2-5 b/l. Pes planus deformity noted bilateral LE.SABRA No pain, crepitus or joint limitation noted with ROM b/l LE.  Patient ambulates independently without assistive aids.  Radiographs: None Last A1c:      Latest Ref Rng & Units 03/15/2024    9:00 AM 11/11/2023    9:04 AM 07/13/2023    9:19 AM 04/06/2023    9:17 AM  Hemoglobin A1C  Hemoglobin-A1c 4.8 - 5.6 % 7.5  7.0  7.5  8.2     Assessment:   1. Pain due to onychomycosis of  toenails of both feet   2. Diabetic peripheral neuropathy associated with type 2 diabetes mellitus (HCC)    Plan:  Patient was evaluated and treated. All patient's and/or POA's questions/concerns addressed on today's visit. Mycotic toenails 1-5 debrided in length and girth without incident.  Continue daily foot inspections and monitor blood glucose per PCP/Endocrinologist's recommendations.Continue soft, supportive shoe gear daily. Report any pedal injuries to medical professional. Call office if there are any quesitons/concerns. -Patient/POA to call should there be question/concern in the interim.  Return in about 3 months (around 07/05/2024).  Delon LITTIE Merlin, DPM      [place] LOCATION: [address], [place] [address]                   Office [phone]   [facility] LOCATION: [address] [place], [place] [address] Office [phone]

## 2024-04-07 ENCOUNTER — Other Ambulatory Visit: Payer: Self-pay | Admitting: Nurse Practitioner

## 2024-04-11 ENCOUNTER — Other Ambulatory Visit: Payer: Self-pay | Admitting: Nurse Practitioner

## 2024-04-11 DIAGNOSIS — E119 Type 2 diabetes mellitus without complications: Secondary | ICD-10-CM | POA: Diagnosis not present

## 2024-04-11 DIAGNOSIS — H2511 Age-related nuclear cataract, right eye: Secondary | ICD-10-CM | POA: Diagnosis not present

## 2024-04-11 DIAGNOSIS — H43813 Vitreous degeneration, bilateral: Secondary | ICD-10-CM | POA: Diagnosis not present

## 2024-04-11 DIAGNOSIS — H40023 Open angle with borderline findings, high risk, bilateral: Secondary | ICD-10-CM | POA: Diagnosis not present

## 2024-04-17 ENCOUNTER — Other Ambulatory Visit: Payer: Self-pay | Admitting: Nurse Practitioner

## 2024-04-17 DIAGNOSIS — R12 Heartburn: Secondary | ICD-10-CM

## 2024-04-23 ENCOUNTER — Other Ambulatory Visit: Payer: Self-pay | Admitting: Nurse Practitioner

## 2024-05-02 ENCOUNTER — Other Ambulatory Visit: Payer: Self-pay | Admitting: Nurse Practitioner

## 2024-05-02 DIAGNOSIS — Z1231 Encounter for screening mammogram for malignant neoplasm of breast: Secondary | ICD-10-CM

## 2024-05-03 ENCOUNTER — Other Ambulatory Visit: Payer: Self-pay | Admitting: Nurse Practitioner

## 2024-05-07 ENCOUNTER — Other Ambulatory Visit: Payer: Self-pay | Admitting: Nurse Practitioner

## 2024-05-09 ENCOUNTER — Other Ambulatory Visit: Payer: Self-pay | Admitting: Nurse Practitioner

## 2024-05-18 ENCOUNTER — Other Ambulatory Visit: Payer: Self-pay | Admitting: Nurse Practitioner

## 2024-05-18 DIAGNOSIS — N1832 Chronic kidney disease, stage 3b: Secondary | ICD-10-CM

## 2024-05-29 ENCOUNTER — Other Ambulatory Visit: Payer: Self-pay | Admitting: Nurse Practitioner

## 2024-05-31 ENCOUNTER — Other Ambulatory Visit: Payer: Self-pay | Admitting: Nurse Practitioner

## 2024-05-31 DIAGNOSIS — R12 Heartburn: Secondary | ICD-10-CM

## 2024-06-04 ENCOUNTER — Other Ambulatory Visit: Payer: Self-pay | Admitting: Nurse Practitioner

## 2024-06-05 ENCOUNTER — Ambulatory Visit
Admission: RE | Admit: 2024-06-05 | Discharge: 2024-06-05 | Disposition: A | Discharge status: Home / Self Care | Source: Ambulatory Visit | Attending: Nurse Practitioner | Admitting: Nurse Practitioner

## 2024-06-05 DIAGNOSIS — Z1231 Encounter for screening mammogram for malignant neoplasm of breast: Secondary | ICD-10-CM | POA: Diagnosis not present

## 2024-06-14 ENCOUNTER — Other Ambulatory Visit: Payer: Self-pay | Admitting: Nurse Practitioner

## 2024-07-02 ENCOUNTER — Other Ambulatory Visit: Payer: Self-pay | Admitting: Nurse Practitioner

## 2024-07-02 DIAGNOSIS — R12 Heartburn: Secondary | ICD-10-CM

## 2024-07-04 ENCOUNTER — Other Ambulatory Visit: Payer: Self-pay | Admitting: Nurse Practitioner

## 2024-07-18 ENCOUNTER — Encounter: Payer: Self-pay | Admitting: Podiatry

## 2024-07-18 ENCOUNTER — Ambulatory Visit: Admitting: Podiatry

## 2024-07-18 DIAGNOSIS — M79674 Pain in right toe(s): Secondary | ICD-10-CM | POA: Diagnosis not present

## 2024-07-18 DIAGNOSIS — M79675 Pain in left toe(s): Secondary | ICD-10-CM | POA: Diagnosis not present

## 2024-07-18 DIAGNOSIS — B351 Tinea unguium: Secondary | ICD-10-CM

## 2024-07-18 DIAGNOSIS — E1142 Type 2 diabetes mellitus with diabetic polyneuropathy: Secondary | ICD-10-CM

## 2024-07-18 NOTE — Progress Notes (Signed)
  Subjective:  Patient ID: [name], female    DOB: March 15, 1935,  MRN: [id number]  [name] presents to clinic today for at risk foot care with history of diabetic neuropathy and painful elongated mycotic toenails 1-5 bilaterally which are tender when wearing enclosed shoe gear. Pain is relieved with periodic professional debridement.  Chief Complaint  Patient presents with   RFC    Diabetic A1c 7.5    New problem(s): None.   PCP is Georgina Speaks, FNP.  No Known Allergies  Review of Systems: Negative except as noted in the HPI.  Objective: No changes noted in today's physical examination. There were no vitals filed for this visit. [name] is a pleasant 89 y.o. female WD, WN in NAD. AAO x 3.  Neurovascular status intact bilaterally and symmetrically. She does have subjective symptoms of neuropathy.  Dermatological Examination: Pedal skin with normal turgor, texture and tone b/l. No open wounds nor interdigital macerations noted. Toenails 1-5 b/l thick, discolored, elongated with subungual debris and pain on dorsal palpation. No hyperkeratotic lesions noted b/l.   Musculoskeletal Examination: Muscle strength 5/5 to all lower extremity muscle groups bilaterally. HAV with bunion bilaterally and hammertoes 2-5 b/l. Pes planus deformity noted bilateral LE.[name] No pain, crepitus or joint limitation noted with ROM b/l LE.  Patient ambulates independently without assistive aids.  Radiographs: None  Assessment/Plan: 1. Pain due to onychomycosis of toenails of both feet   2. Diabetic peripheral neuropathy associated with type 2 diabetes mellitus [facility])    Consent given for treatment. Patient examined. All patient's and/or POA's questions/concerns addressed on today's visit. Toenails 1-5 b/l debrided in length and girth without incident. Continue foot and shoe inspections daily. Monitor blood glucose per PCP/Endocrinologist's recommendations. Continue soft,  supportive shoe gear daily. Report any pedal injuries to medical professional. Call office if there are any questions/concerns. -Patient/POA to call should there be question/concern in the interim.   Return in about 3 months (around 10/18/2024).  Delon LITTIE Merlin, DPM      [facility] LOCATION: [address] [place] [address]                   Office [phone]   [facility] LOCATION: [address] [place], [place] [address] Office [phone]

## 2024-07-22 ENCOUNTER — Other Ambulatory Visit: Payer: Self-pay | Admitting: Nurse Practitioner

## 2024-08-02 ENCOUNTER — Ambulatory Visit: Payer: 59

## 2024-08-02 ENCOUNTER — Ambulatory Visit: Payer: Self-pay | Admitting: Nurse Practitioner

## 2024-08-02 ENCOUNTER — Ambulatory Visit: Payer: Self-pay

## 2024-08-08 NOTE — Progress Notes (Signed)
 LILLETTE Kristeen JINNY Gladis, CMA,acting as a neurosurgeon for Gaines Ada, FNP.,have documented all relevant documentation on the behalf of Gaines Ada, FNP,as directed by  Gaines Ada, FNP while in the presence of Gaines Ada, FNP.  Subjective:  Patient ID: [name] , female    DOB: [birth year] , 89 y.o.   MRN: [id number]  Chief Complaint  Patient presents with   Hypertension    Patient presents today for a bp and dm follow up, Patient reports compliance with medication. Patient denies any chest pain, SOB, or headaches. Patient reports she is having a episode of vertigo but is taking her medication.   Toe Pain    Patient reports she has been having cramps in her toes that makes her whole body feel pain. Patient reports this first started about 3 months ago.     HPI  Discussed the use of AI scribe software for clinical note transcription with the patient, who gave verbal consent to proceed.  History of Present Illness [name] is an 89 year old female with vertigo who presents with worsening vertigo and cramps.  She has been experiencing worsening vertigo over the past two months, occurring daily. She takes meclizine  more frequently than before, which she previously used every month or two. Her vertigo is exacerbated by activities such as climbing stairs or being in high places. She can ride in an elevator but feels uncomfortable if there is more than one person due to her COPD and feelings of claustrophobia. She also notes that her ears feel blocked.  She experiences cramps that start in her toes. She drinks five to six bottles of water  daily and does not take any magnesium or vitamin D  supplements. The cramps can be severe enough to make her feel like she might fall when putting on shoes.  She has a history of COPD and uses Anora inhaler, which she has been on for years. She experiences shortness of breath, especially when climbing stairs.  She expresses concern about her weight,  which has increased to 150 pounds, and attributes this to reduced physical activity due to cold weather. She mentions that she does not care for bread and only eats it occasionally in sandwiches.  She has an upcoming eye doctor appointment next year. She uses [facility] for her prescriptions.  Past Medical History:  Diagnosis Date   Arthritis    Asthma    CAD (coronary artery disease)    s/p NSTEMI 10/19 - LHC:  pRCA 99, oLCx 30 >> DES to RCA; Echo 10/19: Mild focal basal septal hypertrophy, EF 60-65, inf-lat, inf and inf-sept HK, trivial MR, trivial TR   Diabetes mellitus without complication (HCC)    DKA (diabetic ketoacidoses) 08/31/2019   Hypertension      Family History  Problem Relation Age of Onset   Diabetes Mother    Heart disease Mother    Diabetes Father    Diabetes Sister    Heart disease Sister    Diabetes Brother     Current Medications[1]   Allergies[2]   Review of Systems  Constitutional:  Negative for fatigue.  Respiratory:  Negative for shortness of breath and wheezing.   Cardiovascular:  Negative for chest pain and palpitations.  Endocrine: Negative for polydipsia, polyphagia and polyuria.  Neurological:  Negative for headaches.     Today's Vitals   08/09/24 1131  BP: 126/80  Pulse: 75  Temp: 97.8 F (36.6 C)  TempSrc: Oral  Weight: 153 lb (69.4 kg)  Height:  5' 3 (1.6 m)  PainSc: 8   PainLoc: Toe   Body mass index is 27.1 kg/m.  Wt Readings from Last 3 Encounters:  08/09/24 153 lb (69.4 kg)  03/15/24 149 lb 3.2 oz (67.7 kg)  12/28/23 146 lb 12.8 oz (66.6 kg)      Objective:  Physical Exam Vitals and nursing note reviewed.  Constitutional:      General: She is not in acute distress.    Appearance: Normal appearance. She is well-developed.  HENT:     Right Ear: Hearing and external ear normal. There is impacted cerumen.     Left Ear: Hearing and external ear normal. There is impacted cerumen.  Eyes:     General: Lids are normal.      Extraocular Movements: Extraocular movements intact.     Pupils: Pupils are equal, round, and reactive to light.     Funduscopic exam:    Right eye: No papilledema.        Left eye: No papilledema.  Neck:     Thyroid : No thyroid  mass.  Cardiovascular:     Rate and Rhythm: Normal rate and regular rhythm.     Pulses: Normal pulses.     Heart sounds: Normal heart sounds. No murmur heard. Pulmonary:     Effort: Pulmonary effort is normal. No respiratory distress.     Breath sounds: Normal breath sounds. No wheezing.  Chest:     Chest wall: No mass.  Breasts:    Right: Normal. No mass or tenderness.     Left: Normal. No mass or tenderness.  Musculoskeletal:     Cervical back: Full passive range of motion without pain.  Lymphadenopathy:     Upper Body:     Right upper body: No supraclavicular, axillary or pectoral adenopathy.     Left upper body: No supraclavicular, axillary or pectoral adenopathy.  Skin:    General: Skin is warm and dry.     Capillary Refill: Capillary refill takes less than 2 seconds.  Neurological:     General: No focal deficit present.     Mental Status: She is alert and oriented to person, place, and time.     Cranial Nerves: No cranial nerve deficit.     Sensory: No sensory deficit.     Motor: No weakness.  Psychiatric:        Mood and Affect: Mood normal.        Behavior: Behavior normal.        Thought Content: Thought content normal.        Judgment: Judgment normal.      Assessment And Plan:   Assessment & Plan Type 2 diabetes mellitus with stage 3b chronic kidney disease, with long-term current use of insulin  (HCC) Chronic, HgbA1c stable.Continue Trulicity , Lantus  and Jardiance . Tolerating medications well.   Acquired hypothyroidism Chronic, stable, continue current medications. Benign hypertension with CKD (chronic kidney disease) stage III (HCC)  Dyslipidemia Chronic, stable. Continue statin, tolerating well.  Overweight with body mass  index (BMI) of 27 to 27.9 in adult Weight gain likely due to decreased physical activity. - Advised monitoring dietary intake, particularly breads, pastas, and rices. Foot cramps Intermittent foot cramps possibly related to electrolyte imbalance or dehydration. - Ordered labs for magnesium and potassium levels. - Recommended magnesium spray for topical application. Vertigo Chronic vertigo for two months, exacerbated by physical activity. Possible relation to bilateral impacted cerumen, dehydration, or electrolyte imbalance. - Cleaned ears to remove impacted cerumen. - Ordered labs for  dehydration and electrolyte imbalances, including magnesium and potassium. Bilateral impacted cerumen Hard wax in both ears contributing to vertigo. - Cleaned ears to remove impacted cerumen. - Advised use of olive oil to soften earwax.  Orders Placed This Encounter  Procedures   Hemoglobin A1c   Lipid panel   BMP8+eGFR   TSH + free T4   Magnesium   CBC     Return for controlled DM check 4 months, awv.  Patient was given opportunity to ask questions. Patient verbalized understanding of the plan and was able to repeat key elements of the plan. All questions were answered to their satisfaction.    LILLETTE Gaines Ada, FNP, have reviewed all documentation for this visit. The documentation on 08/09/2024 for the exam, diagnosis, procedures, and orders are all accurate and complete.   IF YOU HAVE BEEN REFERRED TO A SPECIALIST, IT MAY TAKE 1-2 WEEKS TO SCHEDULE/PROCESS THE REFERRAL. IF YOU HAVE NOT HEARD FROM US /SPECIALIST IN TWO WEEKS, PLEASE GIVE US  A CALL AT [phone].      [1]  Current Outpatient Medications:    Accu-Chek FastClix Lancets MISC, USE AS DIRECTED FOR DAILY TESTING, Disp: 102 each, Rfl: 0   ACCU-CHEK GUIDE TEST test strip, USE AS DIRECTED ONCE DAILY, Disp: 100 each, Rfl: 0   acetaminophen  (TYLENOL ) 325 MG tablet, Take by mouth every 6 (six) hours as needed for mild pain., Disp: ,  Rfl:    aspirin  EC 81 MG tablet, Take 1 tablet (81 mg total) by mouth daily. Swallow whole., Disp: 90 tablet, Rfl: 3   Cholecalciferol  (VITAMIN D3) 10 MCG (400 UNIT) tablet, Take 400 Units by mouth daily. Per patient taking 2000 iu, Disp: , Rfl:    Ergocalciferol  (VITAMIN D2 PO), , Disp: , Rfl:    Insulin  Pen Needle (PEN NEEDLES 29GX1/2) 29G X MISC, , Disp: , Rfl:    LANTUS  SOLOSTAR 100 UNIT/ML Solostar Pen, INJECT 32 UNITS SUBCUTANEOUSLY ONCE DAILY, Disp: 15 mL, Rfl: 0   metoprolol  succinate (TOPROL -XL) 25 MG 24 hr tablet, Take 1 tablet (25 mg total) by mouth daily., Disp: 90 tablet, Rfl: 3   NAPROXEN PO, , Disp: , Rfl:    nitroGLYCERIN  (NITROSTAT ) 0.4 MG SL tablet, Place 1 tablet (0.4 mg total) under the tongue every 5 (five) minutes x 3 doses as needed for chest pain., Disp: 25 tablet, Rfl: 7   rosuvastatin  (CRESTOR ) 20 MG tablet, Take 1 tablet by mouth once daily, Disp: 90 tablet, Rfl: 3   TRULICITY  4.5 MG/0.5ML SOAJ, INJECT 4.5 MG INTO THE SKIN AS DIRECTED ONCE A WEEK, Disp: 48 mL, Rfl: 0   XIIDRA  5 % SOLN, Place 2 drops into both eyes 2 (two) times daily. , Disp: , Rfl:    amLODipine -olmesartan  (AZOR ) 5-20 MG tablet, Take 1 tablet by mouth every morning., Disp: 90 tablet, Rfl: 1   ANORO ELLIPTA  62.5-25 MCG/ACT AEPB, Inhale 1 puff into the lungs daily., Disp: 60 each, Rfl: 0   empagliflozin  (JARDIANCE ) 25 MG TABS tablet, Take 1 tablet (25 mg total) by mouth daily before breakfast., Disp: 90 tablet, Rfl: 1   levothyroxine  (SYNTHROID ) 50 MCG tablet, Take 1 tablet (50 mcg total) by mouth daily., Disp: 90 tablet, Rfl: 1   meclizine  (ANTIVERT ) 25 MG tablet, TAKE 1 TABLET BY MOUTH ONCE DAILY AS NEEDED FOR DIZZINESS, Disp: 30 tablet, Rfl: 0   omeprazole  (PRILOSEC) 20 MG capsule, Take 1 capsule by mouth once daily, Disp: 30 capsule, Rfl: 0   RELION PEN NEEDLE 31G/8MM  31G X 8 MM MISC, USE AS DIRECTED ONCE DAILY, Disp: 100 each, Rfl: 0 [2] No Known Allergies

## 2024-08-09 ENCOUNTER — Encounter: Payer: Self-pay | Admitting: Nurse Practitioner

## 2024-08-09 ENCOUNTER — Other Ambulatory Visit: Payer: Self-pay | Admitting: Nurse Practitioner

## 2024-08-09 ENCOUNTER — Ambulatory Visit (INDEPENDENT_AMBULATORY_CARE_PROVIDER_SITE_OTHER): Admitting: Nurse Practitioner

## 2024-08-09 VITALS — BP 126/80 | HR 75 | Temp 97.8°F | Ht 63.0 in | Wt 153.0 lb

## 2024-08-09 DIAGNOSIS — R42 Dizziness and giddiness: Secondary | ICD-10-CM | POA: Diagnosis not present

## 2024-08-09 DIAGNOSIS — I129 Hypertensive chronic kidney disease with stage 1 through stage 4 chronic kidney disease, or unspecified chronic kidney disease: Secondary | ICD-10-CM

## 2024-08-09 DIAGNOSIS — Z6827 Body mass index (BMI) 27.0-27.9, adult: Secondary | ICD-10-CM | POA: Diagnosis not present

## 2024-08-09 DIAGNOSIS — E039 Hypothyroidism, unspecified: Secondary | ICD-10-CM | POA: Diagnosis not present

## 2024-08-09 DIAGNOSIS — N1832 Chronic kidney disease, stage 3b: Secondary | ICD-10-CM | POA: Diagnosis not present

## 2024-08-09 DIAGNOSIS — R252 Cramp and spasm: Secondary | ICD-10-CM

## 2024-08-09 DIAGNOSIS — E785 Hyperlipidemia, unspecified: Secondary | ICD-10-CM | POA: Diagnosis not present

## 2024-08-09 DIAGNOSIS — Z794 Long term (current) use of insulin: Secondary | ICD-10-CM

## 2024-08-09 DIAGNOSIS — E663 Overweight: Secondary | ICD-10-CM

## 2024-08-09 DIAGNOSIS — H6123 Impacted cerumen, bilateral: Secondary | ICD-10-CM

## 2024-08-09 DIAGNOSIS — R12 Heartburn: Secondary | ICD-10-CM

## 2024-08-09 DIAGNOSIS — E1122 Type 2 diabetes mellitus with diabetic chronic kidney disease: Secondary | ICD-10-CM | POA: Diagnosis not present

## 2024-08-09 LAB — LIPID PANEL
Chol/HDL Ratio: 2.6 ratio (ref 0.0–4.4)
Cholesterol, Total: 126 mg/dL (ref 100–199)
HDL: 49 mg/dL (ref >39)
LDL Chol Calc (NIH): 57 mg/dL (ref 0–99)
Triglycerides: 112 mg/dL (ref 0–149)
VLDL Cholesterol Cal: 20 mg/dL (ref 5–40)

## 2024-08-09 LAB — CBC
Hematocrit: 41.3 % (ref 34.0–46.6)
Hemoglobin: 12.6 g/dL (ref 11.1–15.9)
MCH: 26.9 pg (ref 26.6–33.0)
MCHC: 30.5 g/dL — ABNORMAL LOW (ref 31.5–35.7)
MCV: 88 fL (ref 79–97)
Platelets: 245 x10E3/uL (ref 150–450)
RBC: 4.69 x10E6/uL (ref 3.77–5.28)
RDW: 14.6 % (ref 11.7–15.4)
WBC: 9 x10E3/uL (ref 3.4–10.8)

## 2024-08-09 LAB — BMP8+EGFR
BUN/Creatinine Ratio: 10 — ABNORMAL LOW (ref 12–28)
BUN: 13 mg/dL (ref 8–27)
CO2: 23 mmol/L (ref 20–29)
Calcium: 9.8 mg/dL (ref 8.7–10.3)
Chloride: 106 mmol/L (ref 96–106)
Creatinine, Ser: 1.33 mg/dL — ABNORMAL HIGH (ref 0.57–1.00)
Glucose: 80 mg/dL (ref 70–99)
Potassium: 4.4 mmol/L (ref 3.5–5.2)
Sodium: 142 mmol/L (ref 134–144)
eGFR: 39 mL/min/1.73 — ABNORMAL LOW (ref >59)

## 2024-08-09 LAB — MAGNESIUM: Magnesium: 2.2 mg/dL (ref 1.6–2.3)

## 2024-08-09 LAB — TSH+FREE T4
Free T4: 1.14 ng/dL (ref 0.82–1.77)
TSH: 1.61 u[IU]/mL (ref 0.450–4.500)

## 2024-08-09 LAB — HEMOGLOBIN A1C
Est. average glucose Bld gHb Est-mCnc: 177 mg/dL
Hgb A1c MFr Bld: 7.8 % — ABNORMAL HIGH (ref 4.8–5.6)

## 2024-08-09 MED ORDER — LEVOTHYROXINE SODIUM 50 MCG PO TABS: 50.0000 ug | ORAL_TABLET | Freq: Every day | ORAL | 1 refills | Status: AC

## 2024-08-09 MED ORDER — EMPAGLIFLOZIN 25 MG PO TABS: 25.0000 mg | ORAL_TABLET | Freq: Every day | ORAL | 1 refills | Status: AC

## 2024-08-09 MED ORDER — AMLODIPINE-OLMESARTAN 5-20 MG PO TABS: 1.0000 | ORAL_TABLET | Freq: Every morning | ORAL | 1 refills | Status: AC

## 2024-08-10 ENCOUNTER — Other Ambulatory Visit: Payer: Self-pay | Admitting: Nurse Practitioner

## 2024-08-16 ENCOUNTER — Other Ambulatory Visit: Payer: Self-pay | Admitting: Nurse Practitioner

## 2024-08-18 ENCOUNTER — Other Ambulatory Visit: Payer: Self-pay

## 2024-08-18 MED ORDER — ANORO ELLIPTA 62.5-25 MCG/ACT IN AEPB: 1.0000 | INHALATION_SPRAY | Freq: Every day | RESPIRATORY_TRACT | 0 refills | Status: DC

## 2024-08-20 ENCOUNTER — Ambulatory Visit: Payer: Self-pay | Admitting: Nurse Practitioner

## 2024-08-20 NOTE — Assessment & Plan Note (Addendum)
 Chronic, stable, continue current medications

## 2024-08-20 NOTE — Assessment & Plan Note (Addendum)
 Chronic, HgbA1c stable.Continue Trulicity , Lantus  and Jardiance . Tolerating medications well.

## 2024-08-20 NOTE — Assessment & Plan Note (Signed)
 Intermittent foot cramps possibly related to electrolyte imbalance or dehydration. - Ordered labs for magnesium and potassium levels. - Recommended magnesium spray for topical application.

## 2024-08-20 NOTE — Assessment & Plan Note (Addendum)
Chronic, stable. Continue statin, tolerating well.

## 2024-08-20 NOTE — Assessment & Plan Note (Signed)
 Hard wax in both ears contributing to vertigo. - Cleaned ears to remove impacted cerumen. - Advised use of olive oil to soften earwax.

## 2024-08-22 ENCOUNTER — Telehealth: Payer: Self-pay

## 2024-08-22 NOTE — Telephone Encounter (Signed)
 Copied from CRM [id number]. Topic: Clinical - Prescription Issue >> Aug 22, 2024  9:58 AM Alfonso HERO wrote: Reason for CRM: Ronal from [facility] pharmacy calling to inform that RELION PEN NEEDLE 31G/8MM 31G X 8 MM MISC are discontinued and wants to know if its ok the change it to the RELION PEN NEEDLE 31G/6MM 31G X 6 MM MISC... her call back number is [phone]

## 2024-08-28 DIAGNOSIS — E1122 Type 2 diabetes mellitus with diabetic chronic kidney disease: Secondary | ICD-10-CM

## 2024-08-28 MED ORDER — RELION MINI PEN NEEDLES 31G X 6 MM MISC: 1 refills | Status: AC

## 2024-09-05 ENCOUNTER — Other Ambulatory Visit: Payer: Self-pay | Admitting: Nurse Practitioner

## 2024-09-06 ENCOUNTER — Ambulatory Visit: Payer: Self-pay

## 2024-09-06 DIAGNOSIS — Z0000 Encounter for general adult medical examination without abnormal findings: Secondary | ICD-10-CM | POA: Diagnosis not present

## 2024-09-06 NOTE — Patient Instructions (Signed)
 [name],  Thank you for taking the time for your Medicare Wellness Visit. I appreciate your continued commitment to your health goals. Please review the care plan we discussed, and feel free to reach out if I can assist you further.  Please note that Annual Wellness Visits do not include a physical exam. Some assessments may be limited, especially if the visit was conducted virtually. If needed, we may recommend an in-person follow-up with your provider.  Ongoing Care Seeing your primary care provider every 3 to 6 months helps us  monitor your health and provide consistent, personalized care.   Referrals If a referral was made during today's visit and you haven't received any updates within two weeks, please contact the referred provider directly to check on the status.  Recommended Screenings:  Health Maintenance  Topic Date Due   Eye exam for diabetics  10/14/2023   Medicare Annual Wellness Visit  07/13/2024   COVID-19 Vaccine (7 - 2025-26 season) 09/22/2024*   Complete foot exam   12/27/2024   Hemoglobin A1C  02/07/2025   DTaP/Tdap/Td vaccine (2 - Td or Tdap) 09/13/2030   Pneumococcal Vaccine for age over 82  Completed   Flu Shot  Completed   Osteoporosis screening with Bone Density Scan  Completed   Zoster (Shingles) Vaccine  Completed   Meningitis B Vaccine  Aged Out   Hepatitis B Vaccine  Discontinued  *Topic was postponed. The date shown is not the original due date.       09/06/2024   11:49 AM  Advanced Directives  Does Patient Have a Medical Advance Directive? No  Would patient like information on creating a medical advance directive? No - Patient declined    Vision: Annual vision screenings are recommended for early detection of glaucoma, cataracts, and diabetic retinopathy. These exams can also reveal signs of chronic conditions such as diabetes and high blood pressure.  Dental: Annual dental screenings help detect early signs of oral cancer, gum disease, and other  conditions linked to overall health, including heart disease and diabetes.  Please see the attached documents for additional preventive care recommendations.

## 2024-09-06 NOTE — Progress Notes (Signed)
 "  Chief Complaint  Patient presents with   Medicare Wellness     Subjective:   [name] is a 89 y.o. female who presents for a Medicare Annual Wellness Visit.  Visit info / Clinical Intake: Medicare Wellness Visit Type:: Subsequent Annual Wellness Visit Persons participating in visit and providing information:: patient Medicare Wellness Visit Mode:: Telephone If telephone:: video declined Since this visit was completed virtually, some vitals may be partially provided or unavailable. Missing vitals are due to the limitations of the virtual format.: Unable to obtain vitals - no equipment If Telephone or Video please confirm:: I connected with patient using audio/video enable telemedicine. I verified patient identity with two identifiers, discussed telehealth limitations, and patient agreed to proceed. Patient Location:: home Provider Location:: office Interpreter Needed?: No Pre-visit prep was completed: yes AWV questionnaire completed by patient prior to visit?: no Living arrangements:: (!) lives alone Patient's Overall Health Status Rating: very good Typical amount of pain: none Does pain affect daily life?: no Are you currently prescribed opioids?: no  Dietary Habits and Nutritional Risks How many meals a day?: 2 Eats fruit and vegetables daily?: yes Most meals are obtained by: eating out In the last 2 weeks, have you had any of the following?: none Diabetic:: (!) yes Any non-healing wounds?: no How often do you check your BS?: 1 Would you like to be referred to a Nutritionist or for Diabetic Management? : no  Functional Status Activities of Daily Living (to include ambulation/medication): Independent Ambulation: Independent Medication Administration: Independent Home Management (perform basic housework or laundry): Independent Manage your own finances?: yes Primary transportation is: driving Concerns about vision?: no *vision screening is required for  WTM* Concerns about hearing?: no  Fall Screening Falls in the past year?: 0 Number of falls in past year: 0 Was there an injury with Fall?: 0 Fall Risk Category Calculator: 0 Patient Fall Risk Level: Low Fall Risk  Fall Risk Patient at Risk for Falls Due to: Medication side effect Fall risk Follow up: Falls prevention discussed; Falls evaluation completed  Home and Transportation Safety: All rugs have non-skid backing?: N/A, no rugs All stairs or steps have railings?: yes Grab bars in the bathtub or shower?: (!) no Have non-skid surface in bathtub or shower?: yes Good home lighting?: yes Regular seat belt use?: yes Hospital stays in the last year:: no  Cognitive Assessment Difficulty concentrating, remembering, or making decisions? : no Will 6CIT or Mini Cog be Completed: yes What year is it?: 0 points What month is it?: 0 points Give patient an address phrase to remember (5 components): [address] [place] About what time is it?: 0 points Count backwards from 20 to 1: 0 points Say the months of the year in reverse: 0 points Repeat the address phrase from earlier: 0 points 6 CIT Score: 0 points  Advance Directives (For Healthcare) Does Patient Have a Medical Advance Directive?: No Would patient like information on creating a medical advance directive?: No - Patient declined  Reviewed/Updated  Reviewed/Updated: Reviewed All (Medical, Surgical, Family, Medications, Allergies, Care Teams, Patient Goals)    Allergies (verified) Patient has no known allergies.   Current Medications (verified) Outpatient Encounter Medications as of 09/06/2024  Medication Sig   Accu-Chek FastClix Lancets MISC USE AS DIRECTED FOR DAILY TESTING   ACCU-CHEK GUIDE TEST test strip USE AS DIRECTED ONCE DAILY   acetaminophen  (TYLENOL ) 325 MG tablet Take by mouth every 6 (six) hours as needed for mild pain.  amLODipine -olmesartan  (AZOR ) 5-20 MG tablet Take 1 tablet by mouth every morning.    ANORO ELLIPTA  62.5-25 MCG/ACT AEPB Inhale 1 puff into the lungs daily.   aspirin  EC 81 MG tablet Take 1 tablet (81 mg total) by mouth daily. Swallow whole.   Cholecalciferol  (VITAMIN D3) 10 MCG (400 UNIT) tablet Take 400 Units by mouth daily. Per patient taking 2000 iu   empagliflozin  (JARDIANCE ) 25 MG TABS tablet Take 1 tablet (25 mg total) by mouth daily before breakfast.   Insulin  Pen Needle (PEN NEEDLES 29GX1/2) 29G X MISC    Insulin  Pen Needle (RELION MINI PEN NEEDLES) 31G X 6 MM MISC USE AS NEEDED FOR INSULIN .   LANTUS  SOLOSTAR 100 UNIT/ML Solostar Pen INJECT 32 UNITS SUBCUTANEOUSLY ONCE DAILY   levothyroxine  (SYNTHROID ) 50 MCG tablet Take 1 tablet (50 mcg total) by mouth daily.   meclizine  (ANTIVERT ) 25 MG tablet TAKE 1 TABLET BY MOUTH ONCE DAILY AS NEEDED FOR DIZZINESS   metoprolol  succinate (TOPROL -XL) 25 MG 24 hr tablet Take 1 tablet (25 mg total) by mouth daily.   NAPROXEN PO    nitroGLYCERIN  (NITROSTAT ) 0.4 MG SL tablet Place 1 tablet (0.4 mg total) under the tongue every 5 (five) minutes x 3 doses as needed for chest pain.   omeprazole  (PRILOSEC) 20 MG capsule Take 1 capsule by mouth once daily   RELION PEN NEEDLE 31G/8MM 31G X 8 MM MISC USE AS DIRECTED ONCE DAILY   rosuvastatin  (CRESTOR ) 20 MG tablet Take 1 tablet by mouth once daily   TRULICITY  4.5 MG/0.5ML SOAJ INJECT 4.5 MG INTO THE SKIN AS DIRECTED ONCE A WEEK   XIIDRA  5 % SOLN Place 2 drops into both eyes 2 (two) times daily.    Ergocalciferol  (VITAMIN D2 PO)  (Patient not taking: Reported on 09/06/2024)   No facility-administered encounter medications on file as of 09/06/2024.    History: Past Medical History:  Diagnosis Date   Arthritis    Asthma    CAD (coronary artery disease)    s/p NSTEMI 10/19 - LHC:  pRCA 99, oLCx 30 >> DES to RCA; Echo 10/19: Mild focal basal septal hypertrophy, EF 60-65, inf-lat, inf and inf-sept HK, trivial MR, trivial TR   Diabetes mellitus without complication (HCC)    DKA (diabetic  ketoacidoses) 08/31/2019   Hypertension    Past Surgical History:  Procedure Laterality Date   ABDOMINAL HYSTERECTOMY  many yrs ago   AXILLARY LYMPH NODE BIOPSY Right 02/26/2015   Procedure: RIGHT AXILLARY LYMPH NODE BIOPSY;  Surgeon: Krystal Russell, MD;  Location: [facility];  Service: General;  Laterality: Right;   CHOLECYSTECTOMY     CORONARY STENT INTERVENTION N/A 06/15/2018   Procedure: CORONARY STENT INTERVENTION;  Surgeon: Darron Deatrice LABOR, MD;  Location: [facility];  Service: Cardiovascular;  Laterality: N/A;   LEFT HEART CATH AND CORONARY ANGIOGRAPHY N/A 06/15/2018   Procedure: LEFT HEART CATH AND CORONARY ANGIOGRAPHY;  Surgeon: Darron Deatrice LABOR, MD;  Location: [facility];  Service: Cardiovascular;  Laterality: N/A;   Family History  Problem Relation Age of Onset   Diabetes Mother    Heart disease Mother    Diabetes Father    Diabetes Sister    Heart disease Sister    Diabetes Brother    Social History   Occupational History   Occupation: retired  Tobacco Use   Smoking status: Former    Current packs/day: 0.50    Average packs/day: 0.5 packs/day for 60.0 years (30.0 ttl pk-yrs)  Types: Cigarettes   Smokeless tobacco: Never  Vaping Use   Vaping status: Never Used  Substance and Sexual Activity   Alcohol use: No   Drug use: No   Sexual activity: Not Currently   Tobacco Counseling Counseling given: Not Answered  SDOH Screenings   Food Insecurity: No Food Insecurity (09/06/2024)  Housing: Unknown (09/06/2024)  Transportation Needs: No Transportation Needs (09/06/2024)  Utilities: Not At Risk (09/06/2024)  Alcohol Screen: Low Risk (09/06/2024)  Depression (PHQ2-9): Low Risk (09/06/2024)  Financial Resource Strain: Low Risk (09/06/2024)  Physical Activity: Sufficiently Active (09/06/2024)  Social Connections: Moderately Isolated (09/06/2024)  Stress: No Stress Concern Present (09/06/2024)  Tobacco Use: Medium Risk (09/06/2024)  Health Literacy: Adequate  Health Literacy (09/06/2024)   See flowsheets for full screening details  Depression Screen PHQ 2 & 9 Depression Scale- Over the past 2 weeks, how often have you been bothered by any of the following problems? Little interest or pleasure in doing things: 0 Feeling down, depressed, or hopeless (PHQ Adolescent also includes...irritable): 0 PHQ-2 Total Score: 0 Trouble falling or staying asleep, or sleeping too much: 0 Feeling tired or having little energy: 0 Poor appetite or overeating (PHQ Adolescent also includes...weight loss): 0 Feeling bad about yourself - or that you are a failure or have let yourself or your family down: 0 Trouble concentrating on things, such as reading the newspaper or watching television (PHQ Adolescent also includes...like school work): 0 Moving or speaking so slowly that other people could have noticed. Or the opposite - being so fidgety or restless that you have been moving around a lot more than usual: 0 Thoughts that you would be better off dead, or of hurting yourself in some way: 0 PHQ-9 Total Score: 0 If you checked off any problems, how difficult have these problems made it for you to do your work, take care of things at home, or get along with other people?: Not difficult at all  Depression Treatment Depression Interventions/Treatment : EYV7-0 Score <4 Follow-up Not Indicated     Goals Addressed             This Visit's Progress    Patient Stated       09/06/2024, stay healthy             Objective:    Today's Vitals   There is no height or weight on file to calculate BMI.  Hearing/Vision screen Hearing Screening - Comments:: Denies hearing issues Vision Screening - Comments:: Regular eye exams Immunizations and Health Maintenance Health Maintenance  Topic Date Due   OPHTHALMOLOGY EXAM  10/14/2023   COVID-19 Vaccine (7 - 2025-26 season) 09/22/2024 (Originally 04/24/2024)   FOOT EXAM  12/27/2024   HEMOGLOBIN A1C  02/07/2025    Medicare Annual Wellness (AWV)  09/06/2025   DTaP/Tdap/Td (2 - Td or Tdap) 09/13/2030   Pneumococcal Vaccine: 50+ Years  Completed   Influenza Vaccine  Completed   Bone Density Scan  Completed   Zoster Vaccines- Shingrix  Completed   Meningococcal B Vaccine  Aged Out   Hepatitis B Vaccines 19-59 Average Risk  Discontinued        Assessment/Plan:  This is a routine wellness examination for Gleed.  Patient Care Team: Georgina Speaks, FNP as PCP - General (General Practice) Wonda Sharper, MD as PCP - Cardiology (Cardiology) Lily Boas, MD as Consulting Physician (General Surgery) Cyrus Carwin, MD as Consulting Physician (Ophthalmology) Gaynel Delon CROME, DPM as Consulting Physician (Podiatry) Gearline Norris, MD as Consulting Physician (Nephrology)  I have personally reviewed and noted the following in the patients chart:   Medical and social history Use of alcohol, tobacco or illicit drugs  Current medications and supplements including opioid prescriptions. Functional ability and status Nutritional status Physical activity Advanced directives List of other physicians Hospitalizations, surgeries, and ER visits in previous 12 months Vitals Screenings to include cognitive, depression, and falls Referrals and appointments  No orders of the defined types were placed in this encounter.  In addition, I have reviewed and discussed with patient certain preventive protocols, quality metrics, and best practice recommendations. A written personalized care plan for preventive services as well as general preventive health recommendations were provided to patient.   Ardella FORBES Dawn, LPN   [id number]   Return in 1 year (on 09/06/2025).  After Visit Summary: (Pick Up) Due to this being a telephonic visit, with patients personalized plan was offered to patient and patient has requested to Pick up at office.  Nurse Notes: HM Addressed: says has an eye appointment in February.  "

## 2024-09-11 ENCOUNTER — Other Ambulatory Visit: Payer: Self-pay | Admitting: Nurse Practitioner

## 2024-09-11 DIAGNOSIS — R12 Heartburn: Secondary | ICD-10-CM

## 2024-10-31 ENCOUNTER — Ambulatory Visit: Admitting: Podiatry

## 2024-12-11 ENCOUNTER — Ambulatory Visit: Payer: Self-pay | Admitting: Nurse Practitioner

## 2025-09-12 ENCOUNTER — Ambulatory Visit: Payer: Self-pay
